# Patient Record
Sex: Female | Born: 1935 | Race: White | Hispanic: No | Smoking: Former smoker
Health system: Southern US, Community
[De-identification: ages and names within clinical notes are randomized; demographics above are authoritative.]

## PROBLEM LIST (undated history)

## (undated) DIAGNOSIS — E785 Hyperlipidemia, unspecified: Secondary | ICD-10-CM

## (undated) DIAGNOSIS — C569 Malignant neoplasm of unspecified ovary: Secondary | ICD-10-CM

## (undated) DIAGNOSIS — E119 Type 2 diabetes mellitus without complications: Secondary | ICD-10-CM

## (undated) DIAGNOSIS — K449 Diaphragmatic hernia without obstruction or gangrene: Secondary | ICD-10-CM

## (undated) DIAGNOSIS — G40909 Epilepsy, unspecified, not intractable, without status epilepticus: Secondary | ICD-10-CM

## (undated) DIAGNOSIS — R569 Unspecified convulsions: Secondary | ICD-10-CM

## (undated) DIAGNOSIS — T7840XA Allergy, unspecified, initial encounter: Secondary | ICD-10-CM

## (undated) DIAGNOSIS — D649 Anemia, unspecified: Secondary | ICD-10-CM

## (undated) DIAGNOSIS — C801 Malignant (primary) neoplasm, unspecified: Secondary | ICD-10-CM

## (undated) DIAGNOSIS — I10 Essential (primary) hypertension: Secondary | ICD-10-CM

## (undated) DIAGNOSIS — R7309 Other abnormal glucose: Secondary | ICD-10-CM

## (undated) HISTORY — DX: Allergy, unspecified, initial encounter: T78.40XA

## (undated) HISTORY — DX: Unspecified convulsions: R56.9

## (undated) HISTORY — PX: ABDOMINAL SURGERY: SHX537

## (undated) HISTORY — DX: Type 2 diabetes mellitus without complications: E11.9

## (undated) HISTORY — PX: CATARACT EXTRACTION, BILATERAL: SHX1313

## (undated) HISTORY — PX: ABDOMINAL HYSTERECTOMY: SHX81

## (undated) HISTORY — DX: Anemia, unspecified: D64.9

## (undated) HISTORY — PX: OTHER SURGICAL HISTORY: SHX169

## (undated) HISTORY — DX: Malignant neoplasm of unspecified ovary: C56.9

## (undated) HISTORY — PX: APPENDECTOMY: SHX54

---

## 2006-08-01 ENCOUNTER — Ambulatory Visit: Payer: Self-pay | Admitting: Internal Medicine

## 2006-09-20 ENCOUNTER — Ambulatory Visit: Payer: Self-pay | Admitting: Internal Medicine

## 2008-11-26 DIAGNOSIS — C569 Malignant neoplasm of unspecified ovary: Secondary | ICD-10-CM | POA: Insufficient documentation

## 2009-03-18 ENCOUNTER — Ambulatory Visit: Payer: Self-pay | Admitting: Pulmonary Disease

## 2009-03-18 ENCOUNTER — Inpatient Hospital Stay (HOSPITAL_COMMUNITY): Admission: EM | Admit: 2009-03-18 | Discharge: 2009-03-31 | Payer: Self-pay | Admitting: Emergency Medicine

## 2009-03-24 ENCOUNTER — Encounter (INDEPENDENT_AMBULATORY_CARE_PROVIDER_SITE_OTHER): Payer: Self-pay | Admitting: Internal Medicine

## 2009-03-31 ENCOUNTER — Inpatient Hospital Stay (HOSPITAL_COMMUNITY)
Admission: RE | Admit: 2009-03-31 | Discharge: 2009-04-10 | Payer: Self-pay | Admitting: Physical Medicine & Rehabilitation

## 2009-03-31 ENCOUNTER — Ambulatory Visit: Payer: Self-pay | Admitting: Physical Medicine & Rehabilitation

## 2009-04-04 ENCOUNTER — Ambulatory Visit: Payer: Self-pay | Admitting: Physical Medicine & Rehabilitation

## 2009-06-01 ENCOUNTER — Inpatient Hospital Stay (HOSPITAL_COMMUNITY): Admission: EM | Admit: 2009-06-01 | Discharge: 2009-06-05 | Payer: Self-pay | Admitting: Emergency Medicine

## 2009-06-09 ENCOUNTER — Emergency Department (HOSPITAL_COMMUNITY): Admission: EM | Admit: 2009-06-09 | Discharge: 2009-06-09 | Payer: Self-pay | Admitting: Emergency Medicine

## 2010-04-15 ENCOUNTER — Other Ambulatory Visit: Payer: Self-pay | Admitting: Oncology

## 2010-04-15 ENCOUNTER — Encounter (HOSPITAL_BASED_OUTPATIENT_CLINIC_OR_DEPARTMENT_OTHER): Payer: Medicare Other | Admitting: Oncology

## 2010-04-15 DIAGNOSIS — C569 Malignant neoplasm of unspecified ovary: Secondary | ICD-10-CM

## 2010-04-15 LAB — CBC WITH DIFFERENTIAL/PLATELET
EOS%: 2.6 % (ref 0.0–7.0)
HCT: 35.4 % (ref 34.8–46.6)
MONO#: 0.6 10*3/uL (ref 0.1–0.9)
NEUT%: 66.1 % (ref 38.4–76.8)
RBC: 3.65 10*6/uL — ABNORMAL LOW (ref 3.70–5.45)
RDW: 13.6 % (ref 11.2–14.5)

## 2010-04-15 LAB — COMPREHENSIVE METABOLIC PANEL
ALT: 10 U/L (ref 0–35)
Albumin: 3.8 g/dL (ref 3.5–5.2)
CO2: 28 mEq/L (ref 19–32)
Calcium: 9.2 mg/dL (ref 8.4–10.5)
Chloride: 102 mEq/L (ref 96–112)
Glucose, Bld: 176 mg/dL — ABNORMAL HIGH (ref 70–99)
Potassium: 4.4 mEq/L (ref 3.5–5.3)
Sodium: 139 mEq/L (ref 135–145)
Total Bilirubin: 0.4 mg/dL (ref 0.3–1.2)
Total Protein: 5.8 g/dL — ABNORMAL LOW (ref 6.0–8.3)

## 2010-04-15 LAB — CANCER ANTIGEN 27.29: CA 27.29: 321 U/mL — ABNORMAL HIGH (ref 0–39)

## 2010-04-16 ENCOUNTER — Other Ambulatory Visit: Payer: Self-pay | Admitting: Oncology

## 2010-04-16 DIAGNOSIS — C569 Malignant neoplasm of unspecified ovary: Secondary | ICD-10-CM

## 2010-04-23 ENCOUNTER — Ambulatory Visit (HOSPITAL_COMMUNITY)
Admission: RE | Admit: 2010-04-23 | Discharge: 2010-04-23 | Disposition: A | Discharge status: Home / Self Care | Payer: Medicare Other | Source: Ambulatory Visit | Attending: Oncology | Admitting: Oncology

## 2010-04-23 DIAGNOSIS — C569 Malignant neoplasm of unspecified ovary: Secondary | ICD-10-CM | POA: Insufficient documentation

## 2010-04-27 ENCOUNTER — Other Ambulatory Visit: Payer: Self-pay | Admitting: Oncology

## 2010-04-27 ENCOUNTER — Encounter (HOSPITAL_BASED_OUTPATIENT_CLINIC_OR_DEPARTMENT_OTHER): Payer: Medicare Other | Admitting: Oncology

## 2010-04-27 DIAGNOSIS — Z5111 Encounter for antineoplastic chemotherapy: Secondary | ICD-10-CM

## 2010-04-27 DIAGNOSIS — C569 Malignant neoplasm of unspecified ovary: Secondary | ICD-10-CM

## 2010-04-27 LAB — COMPREHENSIVE METABOLIC PANEL
AST: 35 U/L (ref 0–37)
Alkaline Phosphatase: 113 U/L (ref 39–117)
BUN: 24 mg/dL — ABNORMAL HIGH (ref 6–23)
Creatinine, Ser: 1.03 mg/dL (ref 0.40–1.20)

## 2010-04-27 LAB — CBC WITH DIFFERENTIAL/PLATELET
BASO%: 0.2 % (ref 0.0–2.0)
EOS%: 2.5 % (ref 0.0–7.0)
MCH: 31.4 pg (ref 25.1–34.0)
MCHC: 33.5 g/dL (ref 31.5–36.0)
MONO#: 0.8 10*3/uL (ref 0.1–0.9)
RBC: 3.76 10*6/uL (ref 3.70–5.45)
RDW: 12.6 % (ref 11.2–14.5)
WBC: 6.3 10*3/uL (ref 3.9–10.3)
lymph#: 0.9 10*3/uL (ref 0.9–3.3)

## 2010-05-04 ENCOUNTER — Other Ambulatory Visit: Payer: Self-pay | Admitting: Oncology

## 2010-05-04 ENCOUNTER — Encounter (HOSPITAL_BASED_OUTPATIENT_CLINIC_OR_DEPARTMENT_OTHER): Payer: Medicare Other | Admitting: Oncology

## 2010-05-04 DIAGNOSIS — C569 Malignant neoplasm of unspecified ovary: Secondary | ICD-10-CM

## 2010-05-04 DIAGNOSIS — Z5111 Encounter for antineoplastic chemotherapy: Secondary | ICD-10-CM

## 2010-05-04 LAB — CBC WITH DIFFERENTIAL/PLATELET
Basophils Absolute: 0 10*3/uL (ref 0.0–0.1)
Eosinophils Absolute: 0 10*3/uL (ref 0.0–0.5)
HGB: 11.9 g/dL (ref 11.6–15.9)
LYMPH%: 20.4 % (ref 14.0–49.7)
MCV: 91.6 fL (ref 79.5–101.0)
MONO#: 0.4 10*3/uL (ref 0.1–0.9)
MONO%: 9.9 % (ref 0.0–14.0)
NEUT#: 3 10*3/uL (ref 1.5–6.5)
Platelets: 140 10*3/uL — ABNORMAL LOW (ref 145–400)
RDW: 12.2 % (ref 11.2–14.5)
WBC: 4.4 10*3/uL (ref 3.9–10.3)

## 2010-05-18 ENCOUNTER — Other Ambulatory Visit: Payer: Self-pay | Admitting: Oncology

## 2010-05-18 ENCOUNTER — Encounter (HOSPITAL_BASED_OUTPATIENT_CLINIC_OR_DEPARTMENT_OTHER): Payer: Medicare Other | Admitting: Oncology

## 2010-05-18 DIAGNOSIS — C569 Malignant neoplasm of unspecified ovary: Secondary | ICD-10-CM

## 2010-05-18 DIAGNOSIS — Z5111 Encounter for antineoplastic chemotherapy: Secondary | ICD-10-CM

## 2010-05-18 LAB — CBC WITH DIFFERENTIAL/PLATELET
BASO%: 0.5 % (ref 0.0–2.0)
Basophils Absolute: 0 10*3/uL (ref 0.0–0.1)
HCT: 30.5 % — ABNORMAL LOW (ref 34.8–46.6)
HGB: 10.1 g/dL — ABNORMAL LOW (ref 11.6–15.9)
LYMPH%: 25.8 % (ref 14.0–49.7)
MCHC: 33.1 g/dL (ref 31.5–36.0)
MONO#: 0.8 10*3/uL (ref 0.1–0.9)
NEUT%: 52 % (ref 38.4–76.8)
Platelets: 346 10*3/uL (ref 145–400)
WBC: 3.9 10*3/uL (ref 3.9–10.3)
lymph#: 1 10*3/uL (ref 0.9–3.3)

## 2010-05-18 LAB — COMPREHENSIVE METABOLIC PANEL
Albumin: 3.8 g/dL (ref 3.5–5.2)
BUN: 21 mg/dL (ref 6–23)
CO2: 24 mEq/L (ref 19–32)
Calcium: 9.1 mg/dL (ref 8.4–10.5)
Chloride: 101 mEq/L (ref 96–112)
Creatinine, Ser: 1.13 mg/dL (ref 0.40–1.20)
Glucose, Bld: 98 mg/dL (ref 70–99)
Potassium: 4.5 mEq/L (ref 3.5–5.3)

## 2010-05-18 LAB — CA 125: CA 125: 2290.9 U/mL — ABNORMAL HIGH (ref 0.0–30.2)

## 2010-05-23 LAB — BASIC METABOLIC PANEL
BUN: 12 mg/dL (ref 6–23)
BUN: 7 mg/dL (ref 6–23)
BUN: 8 mg/dL (ref 6–23)
BUN: 9 mg/dL (ref 6–23)
BUN: 9 mg/dL (ref 6–23)
BUN: 9 mg/dL (ref 6–23)
CO2: 17 mEq/L — ABNORMAL LOW (ref 19–32)
CO2: 19 mEq/L (ref 19–32)
CO2: 20 mEq/L (ref 19–32)
CO2: 20 mEq/L (ref 19–32)
CO2: 21 mEq/L (ref 19–32)
CO2: 21 mEq/L (ref 19–32)
CO2: 21 mEq/L (ref 19–32)
CO2: 24 mEq/L (ref 19–32)
Calcium: 8 mg/dL — ABNORMAL LOW (ref 8.4–10.5)
Calcium: 8.1 mg/dL — ABNORMAL LOW (ref 8.4–10.5)
Calcium: 8.1 mg/dL — ABNORMAL LOW (ref 8.4–10.5)
Calcium: 8.3 mg/dL — ABNORMAL LOW (ref 8.4–10.5)
Calcium: 8.4 mg/dL (ref 8.4–10.5)
Calcium: 8.7 mg/dL (ref 8.4–10.5)
Chloride: 103 mEq/L (ref 96–112)
Chloride: 107 mEq/L (ref 96–112)
Chloride: 98 mEq/L (ref 96–112)
Creatinine, Ser: 0.81 mg/dL (ref 0.4–1.2)
Creatinine, Ser: 0.82 mg/dL (ref 0.4–1.2)
Creatinine, Ser: 0.87 mg/dL (ref 0.4–1.2)
Creatinine, Ser: 0.88 mg/dL (ref 0.4–1.2)
GFR calc Af Amer: >60 mL/min (ref >60)
GFR calc Af Amer: >60 mL/min (ref >60)
GFR calc Af Amer: >60 mL/min (ref >60)
GFR calc Af Amer: >60 mL/min (ref >60)
GFR calc Af Amer: >60 mL/min (ref >60)
GFR calc non Af Amer: >60 mL/min (ref >60)
GFR calc non Af Amer: >60 mL/min (ref >60)
GFR calc non Af Amer: >60 mL/min (ref >60)
Glucose, Bld: 120 mg/dL — ABNORMAL HIGH (ref 70–99)
Glucose, Bld: 131 mg/dL — ABNORMAL HIGH (ref 70–99)
Glucose, Bld: 137 mg/dL — ABNORMAL HIGH (ref 70–99)
Glucose, Bld: 169 mg/dL — ABNORMAL HIGH (ref 70–99)
Glucose, Bld: 188 mg/dL — ABNORMAL HIGH (ref 70–99)
Glucose, Bld: 63 mg/dL — ABNORMAL LOW (ref 70–99)
Glucose, Bld: 82 mg/dL (ref 70–99)
Potassium: 3.3 mEq/L — ABNORMAL LOW (ref 3.5–5.1)
Potassium: 3.4 mEq/L — ABNORMAL LOW (ref 3.5–5.1)
Potassium: 3.6 mEq/L (ref 3.5–5.1)
Potassium: 3.6 mEq/L (ref 3.5–5.1)
Potassium: 3.7 mEq/L (ref 3.5–5.1)
Potassium: 4.2 mEq/L (ref 3.5–5.1)
Potassium: 4.2 mEq/L (ref 3.5–5.1)
Potassium: 4.3 mEq/L (ref 3.5–5.1)
Sodium: 119 mEq/L — CL (ref 135–145)
Sodium: 119 mEq/L — CL (ref 135–145)
Sodium: 122 mEq/L — ABNORMAL LOW (ref 135–145)
Sodium: 128 mEq/L — ABNORMAL LOW (ref 135–145)
Sodium: 130 mEq/L — ABNORMAL LOW (ref 135–145)
Sodium: 131 mEq/L — ABNORMAL LOW (ref 135–145)
Sodium: 133 mEq/L — ABNORMAL LOW (ref 135–145)

## 2010-05-23 LAB — CK TOTAL AND CKMB (NOT AT ARMC)
CK, MB: 4 ng/mL (ref 0.3–4.0)
Relative Index: INVALID (ref 0.0–2.5)
Total CK: 70 U/L (ref 7–177)

## 2010-05-23 LAB — CBC
HCT: 18.6 % — ABNORMAL LOW (ref 36.0–46.0)
HCT: 23.3 % — ABNORMAL LOW (ref 36.0–46.0)
HCT: 27 % — ABNORMAL LOW (ref 36.0–46.0)
Hemoglobin: 6.6 g/dL — CL (ref 12.0–15.0)
Hemoglobin: 8.3 g/dL — ABNORMAL LOW (ref 12.0–15.0)
Hemoglobin: 9.4 g/dL — ABNORMAL LOW (ref 12.0–15.0)
Hemoglobin: 9.7 g/dL — ABNORMAL LOW (ref 12.0–15.0)
MCHC: 35.3 g/dL (ref 30.0–36.0)
MCV: 87.2 fL (ref 78.0–100.0)
MCV: 87.3 fL (ref 78.0–100.0)
RBC: 2.13 MIL/uL — ABNORMAL LOW (ref 3.87–5.11)
RBC: 2.69 MIL/uL — ABNORMAL LOW (ref 3.87–5.11)
RBC: 3.1 MIL/uL — ABNORMAL LOW (ref 3.87–5.11)
RBC: 3.2 MIL/uL — ABNORMAL LOW (ref 3.87–5.11)
RDW: 22.2 % — ABNORMAL HIGH (ref 11.5–15.5)
WBC: 2.4 10*3/uL — ABNORMAL LOW (ref 4.0–10.5)
WBC: 3.8 10*3/uL — ABNORMAL LOW (ref 4.0–10.5)
WBC: 9.9 10*3/uL (ref 4.0–10.5)

## 2010-05-23 LAB — DIFFERENTIAL
Basophils Relative: 0 % (ref 0–1)
Eosinophils Relative: 0 % (ref 0–5)
Lymphocytes Relative: 38 % (ref 12–46)
Monocytes Absolute: 1.8 10*3/uL — ABNORMAL HIGH (ref 0.1–1.0)
Neutro Abs: 4.3 10*3/uL (ref 1.7–7.7)
Neutrophils Relative %: 44 % (ref 43–77)

## 2010-05-23 LAB — CROSSMATCH

## 2010-05-23 LAB — HEMOGLOBIN A1C: Hgb A1c MFr Bld: 7.3 % — ABNORMAL HIGH (ref 4.6–6.1)

## 2010-05-23 LAB — GLUCOSE, CAPILLARY
Glucose-Capillary: 102 mg/dL — ABNORMAL HIGH (ref 70–99)
Glucose-Capillary: 104 mg/dL — ABNORMAL HIGH (ref 70–99)
Glucose-Capillary: 116 mg/dL — ABNORMAL HIGH (ref 70–99)
Glucose-Capillary: 122 mg/dL — ABNORMAL HIGH (ref 70–99)
Glucose-Capillary: 143 mg/dL — ABNORMAL HIGH (ref 70–99)
Glucose-Capillary: 43 mg/dL — CL (ref 70–99)
Glucose-Capillary: 77 mg/dL (ref 70–99)
Glucose-Capillary: 90 mg/dL (ref 70–99)
Glucose-Capillary: 91 mg/dL (ref 70–99)

## 2010-05-23 LAB — SODIUM, URINE, RANDOM
Sodium, Ur: 69 mEq/L
Sodium, Ur: 80 mEq/L

## 2010-05-23 LAB — HEMOGLOBIN AND HEMATOCRIT, BLOOD: HCT: 18.3 % — ABNORMAL LOW (ref 36.0–46.0)

## 2010-05-23 LAB — URINE MICROSCOPIC-ADD ON

## 2010-05-23 LAB — POCT I-STAT 3, ART BLOOD GAS (G3+)
Patient temperature: 36.1
pH, Arterial: 7.429 — ABNORMAL HIGH (ref 7.350–7.400)

## 2010-05-23 LAB — URINALYSIS, ROUTINE W REFLEX MICROSCOPIC
Glucose, UA: >1000 mg/dL — AB
Specific Gravity, Urine: 1.018 (ref 1.005–1.030)
Urobilinogen, UA: 0.2 mg/dL (ref 0.0–1.0)
pH: 5 (ref 5.0–8.0)

## 2010-05-23 LAB — PHOSPHORUS
Phosphorus: 2.6 mg/dL (ref 2.3–4.6)
Phosphorus: 4 mg/dL (ref 2.3–4.6)
Phosphorus: 4.3 mg/dL (ref 2.3–4.6)

## 2010-05-23 LAB — COMPREHENSIVE METABOLIC PANEL
ALT: 21 U/L (ref 0–35)
Albumin: 3.4 g/dL — ABNORMAL LOW (ref 3.5–5.2)
Calcium: 8.7 mg/dL (ref 8.4–10.5)
Glucose, Bld: 299 mg/dL — ABNORMAL HIGH (ref 70–99)
Sodium: 117 mEq/L — CL (ref 135–145)
Total Protein: 5.9 g/dL — ABNORMAL LOW (ref 6.0–8.3)

## 2010-05-23 LAB — PREPARE RBC (CROSSMATCH)

## 2010-05-23 LAB — MAGNESIUM
Magnesium: 1.1 mg/dL — ABNORMAL LOW (ref 1.5–2.5)
Magnesium: 1.7 mg/dL (ref 1.5–2.5)

## 2010-05-23 LAB — TROPONIN I: Troponin I: 0.05 ng/mL (ref 0.00–0.06)

## 2010-05-23 LAB — PROTIME-INR
INR: 0.91 (ref 0.00–1.49)
INR: 0.94 (ref 0.00–1.49)
Prothrombin Time: 12.2 seconds (ref 11.6–15.2)
Prothrombin Time: 12.5 seconds (ref 11.6–15.2)

## 2010-05-23 LAB — APTT: aPTT: 22 seconds — ABNORMAL LOW (ref 24–37)

## 2010-05-23 LAB — OSMOLALITY: Osmolality: 244 mOsm/kg — CL (ref 275–300)

## 2010-05-23 LAB — ABO/RH: ABO/RH(D): A POS

## 2010-05-23 LAB — URINE CULTURE

## 2010-05-23 LAB — OSMOLALITY, URINE: Osmolality, Ur: 366 mOsm/kg — ABNORMAL LOW (ref 390–1090)

## 2010-05-24 LAB — BASIC METABOLIC PANEL
BUN: 11 mg/dL (ref 6–23)
BUN: 11 mg/dL (ref 6–23)
BUN: 26 mg/dL — ABNORMAL HIGH (ref 6–23)
BUN: 29 mg/dL — ABNORMAL HIGH (ref 6–23)
BUN: 30 mg/dL — ABNORMAL HIGH (ref 6–23)
CO2: 21 mEq/L (ref 19–32)
CO2: 22 mEq/L (ref 19–32)
CO2: 24 mEq/L (ref 19–32)
CO2: 28 mEq/L (ref 19–32)
CO2: 29 mEq/L (ref 19–32)
CO2: 30 mEq/L (ref 19–32)
CO2: 31 mEq/L (ref 19–32)
Calcium: 8.3 mg/dL — ABNORMAL LOW (ref 8.4–10.5)
Calcium: 8.5 mg/dL (ref 8.4–10.5)
Calcium: 8.7 mg/dL (ref 8.4–10.5)
Calcium: 9 mg/dL (ref 8.4–10.5)
Calcium: 9.1 mg/dL (ref 8.4–10.5)
Calcium: 9.3 mg/dL (ref 8.4–10.5)
Chloride: 102 mEq/L (ref 96–112)
Chloride: 102 mEq/L (ref 96–112)
Chloride: 103 mEq/L (ref 96–112)
Chloride: 104 mEq/L (ref 96–112)
Chloride: 106 mEq/L (ref 96–112)
Chloride: 108 mEq/L (ref 96–112)
Creatinine, Ser: 0.83 mg/dL (ref 0.4–1.2)
Creatinine, Ser: 1.2 mg/dL (ref 0.4–1.2)
Creatinine, Ser: 1.4 mg/dL — ABNORMAL HIGH (ref 0.4–1.2)
GFR calc Af Amer: 42 mL/min — ABNORMAL LOW (ref >60)
GFR calc Af Amer: 44 mL/min — ABNORMAL LOW (ref >60)
GFR calc Af Amer: 47 mL/min — ABNORMAL LOW (ref >60)
GFR calc Af Amer: 53 mL/min — ABNORMAL LOW (ref >60)
GFR calc Af Amer: >60 mL/min (ref >60)
GFR calc Af Amer: >60 mL/min (ref >60)
GFR calc Af Amer: >60 mL/min (ref >60)
GFR calc Af Amer: >60 mL/min (ref >60)
GFR calc non Af Amer: 35 mL/min — ABNORMAL LOW (ref >60)
GFR calc non Af Amer: 39 mL/min — ABNORMAL LOW (ref >60)
GFR calc non Af Amer: 46 mL/min — ABNORMAL LOW (ref >60)
GFR calc non Af Amer: 58 mL/min — ABNORMAL LOW (ref >60)
GFR calc non Af Amer: 59 mL/min — ABNORMAL LOW (ref >60)
GFR calc non Af Amer: >60 mL/min (ref >60)
Glucose, Bld: 125 mg/dL — ABNORMAL HIGH (ref 70–99)
Glucose, Bld: 170 mg/dL — ABNORMAL HIGH (ref 70–99)
Glucose, Bld: 217 mg/dL — ABNORMAL HIGH (ref 70–99)
Glucose, Bld: 252 mg/dL — ABNORMAL HIGH (ref 70–99)
Glucose, Bld: 290 mg/dL — ABNORMAL HIGH (ref 70–99)
Glucose, Bld: 95 mg/dL (ref 70–99)
Potassium: 3.3 mEq/L — ABNORMAL LOW (ref 3.5–5.1)
Potassium: 3.6 mEq/L (ref 3.5–5.1)
Potassium: 3.7 mEq/L (ref 3.5–5.1)
Potassium: 4.1 mEq/L (ref 3.5–5.1)
Potassium: 4.1 mEq/L (ref 3.5–5.1)
Potassium: 4.4 mEq/L (ref 3.5–5.1)
Potassium: 4.9 mEq/L (ref 3.5–5.1)
Sodium: 133 mEq/L — ABNORMAL LOW (ref 135–145)
Sodium: 135 mEq/L (ref 135–145)
Sodium: 135 mEq/L (ref 135–145)
Sodium: 136 mEq/L (ref 135–145)
Sodium: 137 mEq/L (ref 135–145)
Sodium: 142 mEq/L (ref 135–145)
Sodium: 142 mEq/L (ref 135–145)

## 2010-05-24 LAB — CBC
HCT: 28.3 % — ABNORMAL LOW (ref 36.0–46.0)
HCT: 28.4 % — ABNORMAL LOW (ref 36.0–46.0)
HCT: 29.8 % — ABNORMAL LOW (ref 36.0–46.0)
HCT: 30 % — ABNORMAL LOW (ref 36.0–46.0)
HCT: 30.6 % — ABNORMAL LOW (ref 36.0–46.0)
HCT: 31.5 % — ABNORMAL LOW (ref 36.0–46.0)
Hemoglobin: 10.3 g/dL — ABNORMAL LOW (ref 12.0–15.0)
Hemoglobin: 10.6 g/dL — ABNORMAL LOW (ref 12.0–15.0)
Hemoglobin: 10.6 g/dL — ABNORMAL LOW (ref 12.0–15.0)
Hemoglobin: 8.9 g/dL — ABNORMAL LOW (ref 12.0–15.0)
Hemoglobin: 9.1 g/dL — ABNORMAL LOW (ref 12.0–15.0)
Hemoglobin: 9.6 g/dL — ABNORMAL LOW (ref 12.0–15.0)
Hemoglobin: 9.8 g/dL — ABNORMAL LOW (ref 12.0–15.0)
MCHC: 33.6 g/dL (ref 30.0–36.0)
MCHC: 33.7 g/dL (ref 30.0–36.0)
MCHC: 33.8 g/dL (ref 30.0–36.0)
MCHC: 34.4 g/dL (ref 30.0–36.0)
MCHC: 34.5 g/dL (ref 30.0–36.0)
MCV: 88.2 fL (ref 78.0–100.0)
MCV: 88.5 fL (ref 78.0–100.0)
MCV: 90.6 fL (ref 78.0–100.0)
Platelets: 122 10*3/uL — ABNORMAL LOW (ref 150–400)
Platelets: 122 10*3/uL — ABNORMAL LOW (ref 150–400)
RBC: 2.96 MIL/uL — ABNORMAL LOW (ref 3.87–5.11)
RBC: 3.15 MIL/uL — ABNORMAL LOW (ref 3.87–5.11)
RBC: 3.39 MIL/uL — ABNORMAL LOW (ref 3.87–5.11)
RBC: 3.42 MIL/uL — ABNORMAL LOW (ref 3.87–5.11)
RBC: 3.48 MIL/uL — ABNORMAL LOW (ref 3.87–5.11)
RDW: 21 % — ABNORMAL HIGH (ref 11.5–15.5)
RDW: 21.3 % — ABNORMAL HIGH (ref 11.5–15.5)
RDW: 22 % — ABNORMAL HIGH (ref 11.5–15.5)
RDW: 22.2 % — ABNORMAL HIGH (ref 11.5–15.5)
WBC: 4.3 10*3/uL (ref 4.0–10.5)
WBC: 4.6 10*3/uL (ref 4.0–10.5)
WBC: 4.6 10*3/uL (ref 4.0–10.5)
WBC: 4.8 10*3/uL (ref 4.0–10.5)

## 2010-05-24 LAB — URINALYSIS, ROUTINE W REFLEX MICROSCOPIC
Glucose, UA: NEGATIVE mg/dL
Nitrite: NEGATIVE
Nitrite: NEGATIVE
Protein, ur: NEGATIVE mg/dL
Protein, ur: NEGATIVE mg/dL
Specific Gravity, Urine: 1.014 (ref 1.005–1.030)
Urobilinogen, UA: 1 mg/dL (ref 0.0–1.0)
pH: 6.5 (ref 5.0–8.0)

## 2010-05-24 LAB — COMPREHENSIVE METABOLIC PANEL
ALT: 29 U/L (ref 0–35)
Alkaline Phosphatase: 86 U/L (ref 39–117)
CO2: 28 mEq/L (ref 19–32)
Calcium: 9.1 mg/dL (ref 8.4–10.5)
Chloride: 107 mEq/L (ref 96–112)
GFR calc non Af Amer: >60 mL/min (ref >60)
Glucose, Bld: 75 mg/dL (ref 70–99)
Sodium: 143 mEq/L (ref 135–145)
Total Bilirubin: 0.4 mg/dL (ref 0.3–1.2)

## 2010-05-24 LAB — GLUCOSE, CAPILLARY
Glucose-Capillary: 100 mg/dL — ABNORMAL HIGH (ref 70–99)
Glucose-Capillary: 104 mg/dL — ABNORMAL HIGH (ref 70–99)
Glucose-Capillary: 104 mg/dL — ABNORMAL HIGH (ref 70–99)
Glucose-Capillary: 116 mg/dL — ABNORMAL HIGH (ref 70–99)
Glucose-Capillary: 116 mg/dL — ABNORMAL HIGH (ref 70–99)
Glucose-Capillary: 117 mg/dL — ABNORMAL HIGH (ref 70–99)
Glucose-Capillary: 122 mg/dL — ABNORMAL HIGH (ref 70–99)
Glucose-Capillary: 124 mg/dL — ABNORMAL HIGH (ref 70–99)
Glucose-Capillary: 125 mg/dL — ABNORMAL HIGH (ref 70–99)
Glucose-Capillary: 127 mg/dL — ABNORMAL HIGH (ref 70–99)
Glucose-Capillary: 127 mg/dL — ABNORMAL HIGH (ref 70–99)
Glucose-Capillary: 128 mg/dL — ABNORMAL HIGH (ref 70–99)
Glucose-Capillary: 130 mg/dL — ABNORMAL HIGH (ref 70–99)
Glucose-Capillary: 132 mg/dL — ABNORMAL HIGH (ref 70–99)
Glucose-Capillary: 142 mg/dL — ABNORMAL HIGH (ref 70–99)
Glucose-Capillary: 147 mg/dL — ABNORMAL HIGH (ref 70–99)
Glucose-Capillary: 148 mg/dL — ABNORMAL HIGH (ref 70–99)
Glucose-Capillary: 150 mg/dL — ABNORMAL HIGH (ref 70–99)
Glucose-Capillary: 151 mg/dL — ABNORMAL HIGH (ref 70–99)
Glucose-Capillary: 153 mg/dL — ABNORMAL HIGH (ref 70–99)
Glucose-Capillary: 155 mg/dL — ABNORMAL HIGH (ref 70–99)
Glucose-Capillary: 156 mg/dL — ABNORMAL HIGH (ref 70–99)
Glucose-Capillary: 156 mg/dL — ABNORMAL HIGH (ref 70–99)
Glucose-Capillary: 157 mg/dL — ABNORMAL HIGH (ref 70–99)
Glucose-Capillary: 159 mg/dL — ABNORMAL HIGH (ref 70–99)
Glucose-Capillary: 160 mg/dL — ABNORMAL HIGH (ref 70–99)
Glucose-Capillary: 167 mg/dL — ABNORMAL HIGH (ref 70–99)
Glucose-Capillary: 171 mg/dL — ABNORMAL HIGH (ref 70–99)
Glucose-Capillary: 172 mg/dL — ABNORMAL HIGH (ref 70–99)
Glucose-Capillary: 180 mg/dL — ABNORMAL HIGH (ref 70–99)
Glucose-Capillary: 181 mg/dL — ABNORMAL HIGH (ref 70–99)
Glucose-Capillary: 191 mg/dL — ABNORMAL HIGH (ref 70–99)
Glucose-Capillary: 205 mg/dL — ABNORMAL HIGH (ref 70–99)
Glucose-Capillary: 224 mg/dL — ABNORMAL HIGH (ref 70–99)
Glucose-Capillary: 233 mg/dL — ABNORMAL HIGH (ref 70–99)
Glucose-Capillary: 254 mg/dL — ABNORMAL HIGH (ref 70–99)
Glucose-Capillary: 283 mg/dL — ABNORMAL HIGH (ref 70–99)
Glucose-Capillary: 65 mg/dL — ABNORMAL LOW (ref 70–99)
Glucose-Capillary: 84 mg/dL (ref 70–99)
Glucose-Capillary: 87 mg/dL (ref 70–99)
Glucose-Capillary: 90 mg/dL (ref 70–99)
Glucose-Capillary: 92 mg/dL (ref 70–99)
Glucose-Capillary: 95 mg/dL (ref 70–99)
Glucose-Capillary: 98 mg/dL (ref 70–99)
Glucose-Capillary: 99 mg/dL (ref 70–99)

## 2010-05-24 LAB — DIFFERENTIAL
Basophils Absolute: 0 10*3/uL (ref 0.0–0.1)
Basophils Absolute: 0 10*3/uL (ref 0.0–0.1)
Basophils Relative: 0 % (ref 0–1)
Eosinophils Absolute: 0.1 10*3/uL (ref 0.0–0.7)
Eosinophils Absolute: 0.1 10*3/uL (ref 0.0–0.7)
Eosinophils Relative: 3 % (ref 0–5)
Lymphocytes Relative: 26 % (ref 12–46)
Lymphs Abs: 0.9 10*3/uL (ref 0.7–4.0)
Monocytes Relative: 17 % — ABNORMAL HIGH (ref 3–12)
Neutrophils Relative %: 54 % (ref 43–77)
Neutrophils Relative %: 64 % (ref 43–77)

## 2010-05-24 LAB — URINALYSIS, MICROSCOPIC ONLY
Glucose, UA: 100 mg/dL — AB
Urobilinogen, UA: 1 mg/dL (ref 0.0–1.0)

## 2010-05-24 LAB — PHOSPHORUS
Phosphorus: 2.8 mg/dL (ref 2.3–4.6)
Phosphorus: 3.5 mg/dL (ref 2.3–4.6)

## 2010-05-24 LAB — URINE MICROSCOPIC-ADD ON

## 2010-05-24 LAB — HEMOCCULT GUIAC POC 1CARD (OFFICE): Fecal Occult Bld: POSITIVE

## 2010-05-24 LAB — URINE CULTURE
Colony Count: NO GROWTH
Culture: NO GROWTH
Culture: NO GROWTH

## 2010-05-24 LAB — CARDIAC PANEL(CRET KIN+CKTOT+MB+TROPI)
CK, MB: 7.6 ng/mL (ref 0.3–4.0)
Total CK: 307 U/L — ABNORMAL HIGH (ref 7–177)

## 2010-05-24 LAB — MAGNESIUM
Magnesium: 1.6 mg/dL (ref 1.5–2.5)
Magnesium: 2.5 mg/dL (ref 1.5–2.5)

## 2010-05-25 ENCOUNTER — Encounter (HOSPITAL_BASED_OUTPATIENT_CLINIC_OR_DEPARTMENT_OTHER): Payer: Medicare Other | Admitting: Oncology

## 2010-05-25 ENCOUNTER — Other Ambulatory Visit: Payer: Self-pay | Admitting: Oncology

## 2010-05-25 DIAGNOSIS — C569 Malignant neoplasm of unspecified ovary: Secondary | ICD-10-CM

## 2010-05-25 DIAGNOSIS — Z5111 Encounter for antineoplastic chemotherapy: Secondary | ICD-10-CM

## 2010-05-25 DIAGNOSIS — Z452 Encounter for adjustment and management of vascular access device: Secondary | ICD-10-CM

## 2010-05-25 LAB — CBC WITH DIFFERENTIAL/PLATELET
Basophils Absolute: 0 10*3/uL (ref 0.0–0.1)
Eosinophils Absolute: 0 10*3/uL (ref 0.0–0.5)
HCT: 26.6 % — ABNORMAL LOW (ref 34.8–46.6)
HGB: 9.2 g/dL — ABNORMAL LOW (ref 11.6–15.9)
LYMPH%: 36.6 % (ref 14.0–49.7)
MCV: 93 fL (ref 79.5–101.0)
MONO#: 0.2 10*3/uL (ref 0.1–0.9)
NEUT#: 1.7 10*3/uL (ref 1.5–6.5)
NEUT%: 56.9 % (ref 38.4–76.8)
Platelets: 296 10*3/uL (ref 145–400)
RBC: 2.86 10*6/uL — ABNORMAL LOW (ref 3.70–5.45)
WBC: 3.1 10*3/uL — ABNORMAL LOW (ref 3.9–10.3)

## 2010-05-27 LAB — CBC
Hemoglobin: 8.6 g/dL — ABNORMAL LOW (ref 12.0–15.0)
MCHC: 34.5 g/dL (ref 30.0–36.0)
RBC: 2.67 MIL/uL — ABNORMAL LOW (ref 3.87–5.11)
RBC: 2.74 MIL/uL — ABNORMAL LOW (ref 3.87–5.11)
RDW: 21.5 % — ABNORMAL HIGH (ref 11.5–15.5)
WBC: 4.4 10*3/uL (ref 4.0–10.5)

## 2010-05-27 LAB — GLUCOSE, CAPILLARY
Glucose-Capillary: 101 mg/dL — ABNORMAL HIGH (ref 70–99)
Glucose-Capillary: 102 mg/dL — ABNORMAL HIGH (ref 70–99)
Glucose-Capillary: 110 mg/dL — ABNORMAL HIGH (ref 70–99)
Glucose-Capillary: 113 mg/dL — ABNORMAL HIGH (ref 70–99)
Glucose-Capillary: 95 mg/dL (ref 70–99)

## 2010-05-27 LAB — DIFFERENTIAL
Basophils Absolute: 0 10*3/uL (ref 0.0–0.1)
Eosinophils Absolute: 0.1 10*3/uL (ref 0.0–0.7)
Lymphocytes Relative: 26 % (ref 12–46)
Neutrophils Relative %: 54 % (ref 43–77)

## 2010-05-27 LAB — BASIC METABOLIC PANEL
CO2: 27 mEq/L (ref 19–32)
Calcium: 9.2 mg/dL (ref 8.4–10.5)
Creatinine, Ser: 0.94 mg/dL (ref 0.4–1.2)
GFR calc Af Amer: >60 mL/min (ref >60)
GFR calc non Af Amer: 58 mL/min — ABNORMAL LOW (ref >60)
Glucose, Bld: 104 mg/dL — ABNORMAL HIGH (ref 70–99)

## 2010-05-28 ENCOUNTER — Other Ambulatory Visit: Payer: Self-pay | Admitting: Oncology

## 2010-05-28 ENCOUNTER — Encounter (HOSPITAL_BASED_OUTPATIENT_CLINIC_OR_DEPARTMENT_OTHER): Payer: Medicare Other | Admitting: Oncology

## 2010-05-28 DIAGNOSIS — Z5111 Encounter for antineoplastic chemotherapy: Secondary | ICD-10-CM

## 2010-05-28 DIAGNOSIS — C569 Malignant neoplasm of unspecified ovary: Secondary | ICD-10-CM

## 2010-05-28 LAB — CBC WITH DIFFERENTIAL/PLATELET
Basophils Absolute: 0 10*3/uL (ref 0.0–0.1)
Eosinophils Absolute: 0 10*3/uL (ref 0.0–0.5)
HGB: 9.5 g/dL — ABNORMAL LOW (ref 11.6–15.9)
LYMPH%: 14.9 % (ref 14.0–49.7)
MCV: 92.6 fL (ref 79.5–101.0)
MONO%: 1 % (ref 0.0–14.0)
NEUT#: 3.6 10*3/uL (ref 1.5–6.5)
Platelets: 168 10*3/uL (ref 145–400)
RDW: 13.2 % (ref 11.2–14.5)

## 2010-05-31 LAB — TSH: TSH: 1.024 u[IU]/mL (ref 0.350–4.500)

## 2010-05-31 LAB — CBC
HCT: 25 % — ABNORMAL LOW (ref 36.0–46.0)
Hemoglobin: 8.6 g/dL — ABNORMAL LOW (ref 12.0–15.0)
MCHC: 34.1 g/dL (ref 30.0–36.0)
MCHC: 34.6 g/dL (ref 30.0–36.0)
MCV: 103.3 fL — ABNORMAL HIGH (ref 78.0–100.0)
Platelets: 178 10*3/uL (ref 150–400)
RDW: 21.2 % — ABNORMAL HIGH (ref 11.5–15.5)
RDW: 21.4 % — ABNORMAL HIGH (ref 11.5–15.5)
WBC: 7 10*3/uL (ref 4.0–10.5)

## 2010-05-31 LAB — CULTURE, BLOOD (ROUTINE X 2): Culture: NO GROWTH

## 2010-05-31 LAB — DIFFERENTIAL
Basophils Relative: 0 % (ref 0–1)
Eosinophils Relative: 0 % (ref 0–5)
Lymphs Abs: 0.4 10*3/uL — ABNORMAL LOW (ref 0.7–4.0)
Monocytes Absolute: 0.6 10*3/uL (ref 0.1–1.0)

## 2010-05-31 LAB — COMPREHENSIVE METABOLIC PANEL
AST: 28 U/L (ref 0–37)
Albumin: 3.8 g/dL (ref 3.5–5.2)
Alkaline Phosphatase: 66 U/L (ref 39–117)
BUN: 23 mg/dL (ref 6–23)
Calcium: 9.1 mg/dL (ref 8.4–10.5)
Calcium: 9.9 mg/dL (ref 8.4–10.5)
Creatinine, Ser: 1.36 mg/dL — ABNORMAL HIGH (ref 0.4–1.2)
GFR calc Af Amer: 46 mL/min — ABNORMAL LOW (ref >60)
GFR calc non Af Amer: 44 mL/min — ABNORMAL LOW (ref >60)
Glucose, Bld: 99 mg/dL (ref 70–99)
Potassium: 4.2 mEq/L (ref 3.5–5.1)
Total Protein: 6 g/dL (ref 6.0–8.3)
Total Protein: 6.7 g/dL (ref 6.0–8.3)

## 2010-05-31 LAB — URINALYSIS, ROUTINE W REFLEX MICROSCOPIC
Glucose, UA: NEGATIVE mg/dL
Ketones, ur: NEGATIVE mg/dL
pH: 5 (ref 5.0–8.0)

## 2010-05-31 LAB — DRUG SCREEN PANEL (SERUM)

## 2010-05-31 LAB — URINE CULTURE
Colony Count: NO GROWTH
Culture: NO GROWTH

## 2010-06-08 ENCOUNTER — Other Ambulatory Visit: Payer: Self-pay | Admitting: Oncology

## 2010-06-08 ENCOUNTER — Encounter (HOSPITAL_BASED_OUTPATIENT_CLINIC_OR_DEPARTMENT_OTHER): Payer: Medicare Other | Admitting: Oncology

## 2010-06-08 DIAGNOSIS — Z5111 Encounter for antineoplastic chemotherapy: Secondary | ICD-10-CM

## 2010-06-08 DIAGNOSIS — C569 Malignant neoplasm of unspecified ovary: Secondary | ICD-10-CM

## 2010-06-08 LAB — COMPREHENSIVE METABOLIC PANEL
ALT: 12 U/L (ref 0–35)
AST: 16 U/L (ref 0–37)
Alkaline Phosphatase: 140 U/L — ABNORMAL HIGH (ref 39–117)
Creatinine, Ser: 1.15 mg/dL (ref 0.40–1.20)
Sodium: 139 mEq/L (ref 135–145)
Total Bilirubin: 0.4 mg/dL (ref 0.3–1.2)
Total Protein: 6.2 g/dL (ref 6.0–8.3)

## 2010-06-08 LAB — CBC WITH DIFFERENTIAL/PLATELET
BASO%: 0.3 % (ref 0.0–2.0)
EOS%: 2 % (ref 0.0–7.0)
LYMPH%: 16.9 % (ref 14.0–49.7)
MCH: 32.5 pg (ref 25.1–34.0)
MCHC: 34.1 g/dL (ref 31.5–36.0)
MONO#: 0.4 10*3/uL (ref 0.1–0.9)
MONO%: 11 % (ref 0.0–14.0)
Platelets: 248 10*3/uL (ref 145–400)
RBC: 2.8 10*6/uL — ABNORMAL LOW (ref 3.70–5.45)
WBC: 3.8 10*3/uL — ABNORMAL LOW (ref 3.9–10.3)

## 2010-06-15 ENCOUNTER — Encounter (HOSPITAL_BASED_OUTPATIENT_CLINIC_OR_DEPARTMENT_OTHER): Payer: Medicare Other | Admitting: Oncology

## 2010-06-15 ENCOUNTER — Other Ambulatory Visit: Payer: Self-pay | Admitting: Oncology

## 2010-06-15 DIAGNOSIS — Z5111 Encounter for antineoplastic chemotherapy: Secondary | ICD-10-CM

## 2010-06-15 DIAGNOSIS — C569 Malignant neoplasm of unspecified ovary: Secondary | ICD-10-CM

## 2010-06-15 LAB — CBC WITH DIFFERENTIAL/PLATELET
BASO%: 0.3 % (ref 0.0–2.0)
Basophils Absolute: 0 10*3/uL (ref 0.0–0.1)
EOS%: 0.9 % (ref 0.0–7.0)
HCT: 25.8 % — ABNORMAL LOW (ref 34.8–46.6)
HGB: 8.6 g/dL — ABNORMAL LOW (ref 11.6–15.9)
LYMPH%: 27.5 % (ref 14.0–49.7)
MCH: 31.3 pg (ref 25.1–34.0)
MCHC: 33.3 g/dL (ref 31.5–36.0)
NEUT%: 53.5 % (ref 38.4–76.8)
Platelets: 225 10*3/uL (ref 145–400)

## 2010-06-23 ENCOUNTER — Other Ambulatory Visit: Payer: Self-pay | Admitting: Oncology

## 2010-06-23 ENCOUNTER — Encounter (HOSPITAL_BASED_OUTPATIENT_CLINIC_OR_DEPARTMENT_OTHER): Payer: Medicare Other | Admitting: Oncology

## 2010-06-23 DIAGNOSIS — Z5111 Encounter for antineoplastic chemotherapy: Secondary | ICD-10-CM

## 2010-06-23 DIAGNOSIS — C569 Malignant neoplasm of unspecified ovary: Secondary | ICD-10-CM

## 2010-06-23 LAB — CBC WITH DIFFERENTIAL/PLATELET
Basophils Absolute: 0 10*3/uL (ref 0.0–0.1)
EOS%: 0.4 % (ref 0.0–7.0)
Eosinophils Absolute: 0 10*3/uL (ref 0.0–0.5)
HGB: 8.5 g/dL — ABNORMAL LOW (ref 11.6–15.9)
LYMPH%: 19.5 % (ref 14.0–49.7)
MCH: 33.5 pg (ref 25.1–34.0)
MCV: 97 fL (ref 79.5–101.0)
MONO%: 15.2 % — ABNORMAL HIGH (ref 0.0–14.0)
NEUT#: 3 10*3/uL (ref 1.5–6.5)
Platelets: 57 10*3/uL — ABNORMAL LOW (ref 145–400)
RBC: 2.54 10*6/uL — ABNORMAL LOW (ref 3.70–5.45)
RDW: 18.7 % — ABNORMAL HIGH (ref 11.2–14.5)

## 2010-06-28 ENCOUNTER — Encounter (HOSPITAL_BASED_OUTPATIENT_CLINIC_OR_DEPARTMENT_OTHER): Payer: Medicare Other | Admitting: Oncology

## 2010-06-28 ENCOUNTER — Other Ambulatory Visit: Payer: Self-pay | Admitting: Oncology

## 2010-06-28 DIAGNOSIS — R609 Edema, unspecified: Secondary | ICD-10-CM

## 2010-06-28 DIAGNOSIS — C569 Malignant neoplasm of unspecified ovary: Secondary | ICD-10-CM

## 2010-06-28 LAB — CBC WITH DIFFERENTIAL/PLATELET
BASO%: 0.2 % (ref 0.0–2.0)
EOS%: 0.4 % (ref 0.0–7.0)
HCT: 23.8 % — ABNORMAL LOW (ref 34.8–46.6)
LYMPH%: 13.3 % — ABNORMAL LOW (ref 14.0–49.7)
MCH: 33.8 pg (ref 25.1–34.0)
MCHC: 34.2 g/dL (ref 31.5–36.0)
MONO%: 15.6 % — ABNORMAL HIGH (ref 0.0–14.0)
NEUT%: 70.5 % (ref 38.4–76.8)
Platelets: 126 10*3/uL — ABNORMAL LOW (ref 145–400)
RBC: 2.4 10*6/uL — ABNORMAL LOW (ref 3.70–5.45)
WBC: 5.6 10*3/uL (ref 3.9–10.3)

## 2010-06-28 LAB — COMPREHENSIVE METABOLIC PANEL
ALT: 21 U/L (ref 0–35)
AST: 22 U/L (ref 0–37)
Alkaline Phosphatase: 94 U/L (ref 39–117)
CO2: 28 mEq/L (ref 19–32)
Creatinine, Ser: 1.19 mg/dL (ref 0.40–1.20)
Sodium: 140 mEq/L (ref 135–145)
Total Bilirubin: 0.7 mg/dL (ref 0.3–1.2)
Total Protein: 6.3 g/dL (ref 6.0–8.3)

## 2010-06-29 LAB — CA 125: CA 125: 42.2 U/mL — ABNORMAL HIGH (ref 0.0–30.2)

## 2010-06-30 ENCOUNTER — Encounter (HOSPITAL_COMMUNITY)
Admission: RE | Admit: 2010-06-30 | Discharge: 2010-06-30 | Disposition: A | Discharge status: Home / Self Care | Payer: Medicare Other | Source: Ambulatory Visit | Attending: Oncology | Admitting: Oncology

## 2010-06-30 ENCOUNTER — Encounter (HOSPITAL_BASED_OUTPATIENT_CLINIC_OR_DEPARTMENT_OTHER): Payer: Medicare Other | Admitting: Oncology

## 2010-06-30 ENCOUNTER — Other Ambulatory Visit: Payer: Self-pay | Admitting: Oncology

## 2010-06-30 DIAGNOSIS — Z5111 Encounter for antineoplastic chemotherapy: Secondary | ICD-10-CM

## 2010-06-30 DIAGNOSIS — Z452 Encounter for adjustment and management of vascular access device: Secondary | ICD-10-CM

## 2010-06-30 DIAGNOSIS — D649 Anemia, unspecified: Secondary | ICD-10-CM | POA: Insufficient documentation

## 2010-06-30 DIAGNOSIS — C569 Malignant neoplasm of unspecified ovary: Secondary | ICD-10-CM

## 2010-06-30 LAB — CBC WITH DIFFERENTIAL/PLATELET
Basophils Absolute: 0 10*3/uL (ref 0.0–0.1)
EOS%: 0.4 % (ref 0.0–7.0)
HCT: 26.2 % — ABNORMAL LOW (ref 34.8–46.6)
HGB: 8.5 g/dL — ABNORMAL LOW (ref 11.6–15.9)
LYMPH%: 14.3 % (ref 14.0–49.7)
MCH: 32.2 pg (ref 25.1–34.0)
MCHC: 32.4 g/dL (ref 31.5–36.0)
NEUT%: 72.3 % (ref 38.4–76.8)
Platelets: 155 10*3/uL (ref 145–400)
lymph#: 1 10*3/uL (ref 0.9–3.3)

## 2010-06-30 LAB — ABO/RH: ABO/RH(D): A POS

## 2010-07-01 LAB — TYPE & CROSSMATCH - CHCC

## 2010-07-02 ENCOUNTER — Encounter (HOSPITAL_BASED_OUTPATIENT_CLINIC_OR_DEPARTMENT_OTHER): Payer: Medicare Other | Admitting: Oncology

## 2010-07-02 DIAGNOSIS — D649 Anemia, unspecified: Secondary | ICD-10-CM

## 2010-07-03 LAB — CROSSMATCH
Antibody Screen: NEGATIVE
Unit division: 0

## 2010-07-05 ENCOUNTER — Encounter (HOSPITAL_COMMUNITY): Payer: Medicare Other

## 2010-07-06 ENCOUNTER — Other Ambulatory Visit: Payer: Self-pay | Admitting: Oncology

## 2010-07-06 ENCOUNTER — Encounter (HOSPITAL_BASED_OUTPATIENT_CLINIC_OR_DEPARTMENT_OTHER): Payer: Medicare Other | Admitting: Oncology

## 2010-07-06 DIAGNOSIS — Z5111 Encounter for antineoplastic chemotherapy: Secondary | ICD-10-CM

## 2010-07-06 DIAGNOSIS — C569 Malignant neoplasm of unspecified ovary: Secondary | ICD-10-CM

## 2010-07-06 LAB — CBC WITH DIFFERENTIAL/PLATELET
BASO%: 0.6 % (ref 0.0–2.0)
EOS%: 0.5 % (ref 0.0–7.0)
MCH: 32.6 pg (ref 25.1–34.0)
MCHC: 34.3 g/dL (ref 31.5–36.0)
RDW: 19.3 % — ABNORMAL HIGH (ref 11.2–14.5)
WBC: 2 10*3/uL — ABNORMAL LOW (ref 3.9–10.3)
lymph#: 0.6 10*3/uL — ABNORMAL LOW (ref 0.9–3.3)

## 2010-07-06 LAB — COMPREHENSIVE METABOLIC PANEL
ALT: 32 U/L (ref 0–35)
AST: 31 U/L (ref 0–37)
Albumin: 3.7 g/dL (ref 3.5–5.2)
Calcium: 9.1 mg/dL (ref 8.4–10.5)
Chloride: 101 mEq/L (ref 96–112)
Potassium: 4.3 mEq/L (ref 3.5–5.3)
Sodium: 136 mEq/L (ref 135–145)
Total Protein: 6.4 g/dL (ref 6.0–8.3)

## 2010-07-13 ENCOUNTER — Other Ambulatory Visit: Payer: Self-pay | Admitting: Oncology

## 2010-07-13 ENCOUNTER — Encounter (HOSPITAL_BASED_OUTPATIENT_CLINIC_OR_DEPARTMENT_OTHER): Payer: Medicare Other | Admitting: Oncology

## 2010-07-13 DIAGNOSIS — Z5111 Encounter for antineoplastic chemotherapy: Secondary | ICD-10-CM

## 2010-07-13 DIAGNOSIS — Z452 Encounter for adjustment and management of vascular access device: Secondary | ICD-10-CM

## 2010-07-13 DIAGNOSIS — C569 Malignant neoplasm of unspecified ovary: Secondary | ICD-10-CM

## 2010-07-13 LAB — CBC WITH DIFFERENTIAL/PLATELET
BASO%: 0.1 % (ref 0.0–2.0)
EOS%: 0.1 % (ref 0.0–7.0)
HCT: 25.7 % — ABNORMAL LOW (ref 34.8–46.6)
MCH: 33.4 pg (ref 25.1–34.0)
MCHC: 35.2 g/dL (ref 31.5–36.0)
NEUT%: 63.3 % (ref 38.4–76.8)
lymph#: 0.8 10*3/uL — ABNORMAL LOW (ref 0.9–3.3)

## 2010-07-20 ENCOUNTER — Encounter (HOSPITAL_BASED_OUTPATIENT_CLINIC_OR_DEPARTMENT_OTHER): Payer: Medicare Other | Admitting: Oncology

## 2010-07-20 ENCOUNTER — Other Ambulatory Visit: Payer: Self-pay | Admitting: Oncology

## 2010-07-20 DIAGNOSIS — Z5111 Encounter for antineoplastic chemotherapy: Secondary | ICD-10-CM

## 2010-07-20 DIAGNOSIS — Z452 Encounter for adjustment and management of vascular access device: Secondary | ICD-10-CM

## 2010-07-20 DIAGNOSIS — C569 Malignant neoplasm of unspecified ovary: Secondary | ICD-10-CM

## 2010-07-20 LAB — CBC WITH DIFFERENTIAL/PLATELET
BASO%: 0.3 % (ref 0.0–2.0)
Basophils Absolute: 0 10*3/uL (ref 0.0–0.1)
EOS%: 0.7 % (ref 0.0–7.0)
Eosinophils Absolute: 0 10*3/uL (ref 0.0–0.5)
HCT: 26.2 % — ABNORMAL LOW (ref 34.8–46.6)
HGB: 8.7 g/dL — ABNORMAL LOW (ref 11.6–15.9)
LYMPH%: 22.5 % (ref 14.0–49.7)
MCH: 31.6 pg (ref 25.1–34.0)
MCHC: 33.2 g/dL (ref 31.5–36.0)
MCV: 95.3 fL (ref 79.5–101.0)
MONO#: 0.5 10*3/uL (ref 0.1–0.9)
MONO%: 17.8 % — ABNORMAL HIGH (ref 0.0–14.0)
NEUT#: 1.8 10*3/uL (ref 1.5–6.5)
NEUT%: 58.7 % (ref 38.4–76.8)
Platelets: 130 10*3/uL — ABNORMAL LOW (ref 145–400)
RBC: 2.75 10*6/uL — ABNORMAL LOW (ref 3.70–5.45)
RDW: 19.6 % — ABNORMAL HIGH (ref 11.2–14.5)
WBC: 3 10*3/uL — ABNORMAL LOW (ref 3.9–10.3)
lymph#: 0.7 10*3/uL — ABNORMAL LOW (ref 0.9–3.3)

## 2010-07-20 LAB — COMPREHENSIVE METABOLIC PANEL
ALT: 13 U/L (ref 0–35)
Albumin: 3.8 g/dL (ref 3.5–5.2)
CO2: 24 mEq/L (ref 19–32)
Calcium: 8.9 mg/dL (ref 8.4–10.5)
Chloride: 104 mEq/L (ref 96–112)
Potassium: 4.2 mEq/L (ref 3.5–5.3)
Sodium: 138 mEq/L (ref 135–145)
Total Protein: 5.8 g/dL — ABNORMAL LOW (ref 6.0–8.3)

## 2010-07-20 LAB — CA 125: CA 125: 17.4 U/mL (ref 0.0–30.2)

## 2010-07-27 ENCOUNTER — Other Ambulatory Visit: Payer: Self-pay | Admitting: Oncology

## 2010-07-27 ENCOUNTER — Encounter (HOSPITAL_BASED_OUTPATIENT_CLINIC_OR_DEPARTMENT_OTHER): Payer: Medicare Other | Admitting: Oncology

## 2010-07-27 DIAGNOSIS — T451X5A Adverse effect of antineoplastic and immunosuppressive drugs, initial encounter: Secondary | ICD-10-CM

## 2010-07-27 DIAGNOSIS — C569 Malignant neoplasm of unspecified ovary: Secondary | ICD-10-CM

## 2010-07-27 DIAGNOSIS — Z5111 Encounter for antineoplastic chemotherapy: Secondary | ICD-10-CM

## 2010-07-27 DIAGNOSIS — D6481 Anemia due to antineoplastic chemotherapy: Secondary | ICD-10-CM

## 2010-07-27 LAB — CBC WITH DIFFERENTIAL/PLATELET
BASO%: 0.7 % (ref 0.0–2.0)
EOS%: 0.7 % (ref 0.0–7.0)
HCT: 24.5 % — ABNORMAL LOW (ref 34.8–46.6)
MCH: 31.6 pg (ref 25.1–34.0)
MCHC: 33.1 g/dL (ref 31.5–36.0)
NEUT%: 53.4 % (ref 38.4–76.8)
RBC: 2.56 10*6/uL — ABNORMAL LOW (ref 3.70–5.45)
RDW: 19.3 % — ABNORMAL HIGH (ref 11.2–14.5)
lymph#: 1 10*3/uL (ref 0.9–3.3)

## 2010-07-27 LAB — HOLD TUBE, BLOOD BANK

## 2010-07-27 LAB — TYPE & CROSSMATCH - CHCC

## 2010-07-28 ENCOUNTER — Encounter (HOSPITAL_BASED_OUTPATIENT_CLINIC_OR_DEPARTMENT_OTHER): Payer: Medicare Other | Admitting: Oncology

## 2010-07-28 DIAGNOSIS — D649 Anemia, unspecified: Secondary | ICD-10-CM

## 2010-07-29 LAB — CROSSMATCH: Unit division: 0

## 2010-08-10 ENCOUNTER — Other Ambulatory Visit: Payer: Self-pay | Admitting: Oncology

## 2010-08-10 ENCOUNTER — Encounter (HOSPITAL_BASED_OUTPATIENT_CLINIC_OR_DEPARTMENT_OTHER): Payer: Medicare Other | Admitting: Oncology

## 2010-08-10 DIAGNOSIS — Z452 Encounter for adjustment and management of vascular access device: Secondary | ICD-10-CM

## 2010-08-10 DIAGNOSIS — Z5111 Encounter for antineoplastic chemotherapy: Secondary | ICD-10-CM

## 2010-08-10 DIAGNOSIS — C569 Malignant neoplasm of unspecified ovary: Secondary | ICD-10-CM

## 2010-08-10 LAB — COMPREHENSIVE METABOLIC PANEL
BUN: 19 mg/dL (ref 6–23)
CO2: 24 mEq/L (ref 19–32)
Calcium: 9.5 mg/dL (ref 8.4–10.5)
Chloride: 103 mEq/L (ref 96–112)
Creatinine, Ser: 1.17 mg/dL — ABNORMAL HIGH (ref 0.50–1.10)
Total Bilirubin: 0.4 mg/dL (ref 0.3–1.2)

## 2010-08-10 LAB — CBC WITH DIFFERENTIAL/PLATELET
EOS%: 0.8 % (ref 0.0–7.0)
MCH: 31.1 pg (ref 25.1–34.0)
MCV: 95.2 fL (ref 79.5–101.0)
MONO%: 17.1 % — ABNORMAL HIGH (ref 0.0–14.0)
NEUT#: 3.3 10*3/uL (ref 1.5–6.5)
RBC: 2.93 10*6/uL — ABNORMAL LOW (ref 3.70–5.45)
RDW: 20.1 % — ABNORMAL HIGH (ref 11.2–14.5)
nRBC: 0 % (ref 0–0)

## 2010-08-10 LAB — CA 125: CA 125: 15.2 U/mL (ref 0.0–30.2)

## 2010-08-17 ENCOUNTER — Encounter (HOSPITAL_BASED_OUTPATIENT_CLINIC_OR_DEPARTMENT_OTHER): Payer: Medicare Other | Admitting: Oncology

## 2010-08-17 ENCOUNTER — Other Ambulatory Visit: Payer: Self-pay | Admitting: Oncology

## 2010-08-17 DIAGNOSIS — Z5111 Encounter for antineoplastic chemotherapy: Secondary | ICD-10-CM

## 2010-08-17 DIAGNOSIS — C569 Malignant neoplasm of unspecified ovary: Secondary | ICD-10-CM

## 2010-08-17 DIAGNOSIS — Z452 Encounter for adjustment and management of vascular access device: Secondary | ICD-10-CM

## 2010-08-17 LAB — CBC WITH DIFFERENTIAL/PLATELET
Basophils Absolute: 0 10*3/uL (ref 0.0–0.1)
EOS%: 0.8 % (ref 0.0–7.0)
Eosinophils Absolute: 0 10*3/uL (ref 0.0–0.5)
HCT: 28.3 % — ABNORMAL LOW (ref 34.8–46.6)
HGB: 9.5 g/dL — ABNORMAL LOW (ref 11.6–15.9)
MONO#: 0.2 10*3/uL (ref 0.1–0.9)
NEUT#: 1.3 10*3/uL — ABNORMAL LOW (ref 1.5–6.5)
NEUT%: 53.7 % (ref 38.4–76.8)
RDW: 19.6 % — ABNORMAL HIGH (ref 11.2–14.5)
WBC: 2.4 10*3/uL — ABNORMAL LOW (ref 3.9–10.3)
lymph#: 0.8 10*3/uL — ABNORMAL LOW (ref 0.9–3.3)

## 2010-08-31 ENCOUNTER — Encounter (HOSPITAL_COMMUNITY)
Admission: RE | Admit: 2010-08-31 | Discharge: 2010-08-31 | Disposition: A | Discharge status: Home / Self Care | Payer: Medicare Other | Source: Ambulatory Visit | Attending: Oncology | Admitting: Oncology

## 2010-08-31 ENCOUNTER — Other Ambulatory Visit: Payer: Self-pay | Admitting: Oncology

## 2010-08-31 ENCOUNTER — Encounter (HOSPITAL_BASED_OUTPATIENT_CLINIC_OR_DEPARTMENT_OTHER): Payer: Medicare Other | Admitting: Oncology

## 2010-08-31 DIAGNOSIS — C569 Malignant neoplasm of unspecified ovary: Secondary | ICD-10-CM

## 2010-08-31 DIAGNOSIS — D649 Anemia, unspecified: Secondary | ICD-10-CM | POA: Insufficient documentation

## 2010-08-31 DIAGNOSIS — Z452 Encounter for adjustment and management of vascular access device: Secondary | ICD-10-CM

## 2010-08-31 DIAGNOSIS — Z5111 Encounter for antineoplastic chemotherapy: Secondary | ICD-10-CM

## 2010-08-31 LAB — CBC WITH DIFFERENTIAL/PLATELET
Basophils Absolute: 0 10*3/uL (ref 0.0–0.1)
Eosinophils Absolute: 0 10*3/uL (ref 0.0–0.5)
HCT: 24.8 % — ABNORMAL LOW (ref 34.8–46.6)
HGB: 7.8 g/dL — ABNORMAL LOW (ref 11.6–15.9)
LYMPH%: 12.7 % — ABNORMAL LOW (ref 14.0–49.7)
MONO#: 0.9 10*3/uL (ref 0.1–0.9)
NEUT#: 2.7 10*3/uL (ref 1.5–6.5)
Platelets: 117 10*3/uL — ABNORMAL LOW (ref 145–400)
RBC: 2.44 10*6/uL — ABNORMAL LOW (ref 3.70–5.45)
WBC: 4.2 10*3/uL (ref 3.9–10.3)

## 2010-08-31 LAB — COMPREHENSIVE METABOLIC PANEL
ALT: 12 U/L (ref 0–35)
AST: 21 U/L (ref 0–37)
Alkaline Phosphatase: 92 U/L (ref 39–117)
BUN: 17 mg/dL (ref 6–23)
Creatinine, Ser: 1.11 mg/dL — ABNORMAL HIGH (ref 0.50–1.10)
Potassium: 4.2 mEq/L (ref 3.5–5.3)

## 2010-08-31 LAB — TYPE & CROSSMATCH - CHCC

## 2010-09-01 LAB — CROSSMATCH
ABO/RH(D): A POS
Unit division: 0
Unit division: 0

## 2010-09-21 IMAGING — RF DG INTRO LONG GI TUBE
1 series · 1 of 1 positions shown · IV contrast (agent unspecified)
Comparison: 03/23/2009

CLINICAL DATA: Feeding tube placement.

LONG GI TUBE PLACMENT
Fluoroscopy Time: 8.9 minutes
Contrast: 20 ml [id number]

[Series 1: run · 1 of 1 slices shown]
[im 1/1]
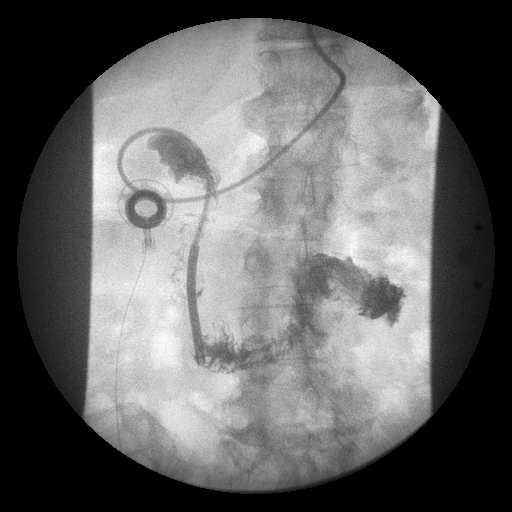

[1 of 1 positions shown; findings below may reference images not displayed]

FINDINGS: A feeding tube was placed under fluoroscopic guidance.  A
large hiatal hernia again made placement difficult.  However, the
tube was placed to the junction of the descending and transverse
duodenum.  Small amount of contrast confirms location.  Expect
peristalsis to move the tube to the duodenal jejunal junction.
IMPRESSION: Feeding tube placed to the descending/transverse duodenal
junction.

## 2010-10-05 ENCOUNTER — Other Ambulatory Visit: Payer: Self-pay | Admitting: Oncology

## 2010-10-05 ENCOUNTER — Encounter (HOSPITAL_BASED_OUTPATIENT_CLINIC_OR_DEPARTMENT_OTHER): Payer: Medicare Other | Admitting: Oncology

## 2010-10-05 DIAGNOSIS — C569 Malignant neoplasm of unspecified ovary: Secondary | ICD-10-CM

## 2010-10-05 LAB — COMPREHENSIVE METABOLIC PANEL
ALT: 12 U/L (ref 0–35)
AST: 22 U/L (ref 0–37)
Albumin: 4.1 g/dL (ref 3.5–5.2)
CO2: 25 mEq/L (ref 19–32)
Calcium: 10 mg/dL (ref 8.4–10.5)
Chloride: 104 mEq/L (ref 96–112)
Potassium: 4.3 mEq/L (ref 3.5–5.3)
Total Protein: 6.2 g/dL (ref 6.0–8.3)

## 2010-10-05 LAB — CBC WITH DIFFERENTIAL/PLATELET
Basophils Absolute: 0 10*3/uL (ref 0.0–0.1)
Eosinophils Absolute: 0.2 10*3/uL (ref 0.0–0.5)
HGB: 12 g/dL (ref 11.6–15.9)
LYMPH%: 13.9 % — ABNORMAL LOW (ref 14.0–49.7)
MCV: 99.7 fL (ref 79.5–101.0)
MONO%: 12.8 % (ref 0.0–14.0)
NEUT#: 4.4 10*3/uL (ref 1.5–6.5)
NEUT%: 69.6 % (ref 38.4–76.8)
Platelets: 132 10*3/uL — ABNORMAL LOW (ref 145–400)

## 2011-03-11 ENCOUNTER — Ambulatory Visit (HOSPITAL_BASED_OUTPATIENT_CLINIC_OR_DEPARTMENT_OTHER): Payer: Medicare Other

## 2011-03-11 DIAGNOSIS — C569 Malignant neoplasm of unspecified ovary: Secondary | ICD-10-CM

## 2011-03-11 MED ORDER — HEPARIN SOD (PORK) LOCK FLUSH 100 UNIT/ML IV SOLN
500.0000 [IU] | Freq: Once | INTRAVENOUS | Status: AC
  Administered 2011-03-11: 500 [IU] via INTRAVENOUS
  Filled 2011-03-11: qty 5

## 2011-03-11 MED ORDER — SODIUM CHLORIDE 0.9 % IJ SOLN
10.0000 mL | INTRAMUSCULAR | Status: DC | PRN
  Administered 2011-03-11: 10 mL via INTRAVENOUS
  Filled 2011-03-11: qty 10

## 2011-04-20 ENCOUNTER — Other Ambulatory Visit: Payer: Self-pay | Admitting: *Deleted

## 2011-04-20 ENCOUNTER — Telehealth: Payer: Self-pay | Admitting: *Deleted

## 2011-04-20 DIAGNOSIS — C569 Malignant neoplasm of unspecified ovary: Secondary | ICD-10-CM

## 2011-04-20 NOTE — Telephone Encounter (Signed)
Debbie called asking for patient to have a ca-125 lab draw done at [facility] since she lives in [place].  This test was ordered there but was lost.  Will fax order request.  [facility] schedulers will call patient.  Will notify providers.

## 2011-04-21 ENCOUNTER — Telehealth: Payer: Self-pay | Admitting: Oncology

## 2011-04-21 ENCOUNTER — Other Ambulatory Visit: Payer: Self-pay | Admitting: *Deleted

## 2011-04-21 NOTE — Telephone Encounter (Signed)
The pt's dtr called to get the feb 2013 appts

## 2011-04-22 ENCOUNTER — Other Ambulatory Visit: Payer: Medicare Other | Admitting: Lab

## 2011-04-22 DIAGNOSIS — C569 Malignant neoplasm of unspecified ovary: Secondary | ICD-10-CM

## 2011-04-23 ENCOUNTER — Other Ambulatory Visit: Payer: Self-pay | Admitting: Oncology

## 2011-04-25 ENCOUNTER — Telehealth: Payer: Self-pay | Admitting: Oncology

## 2011-04-25 NOTE — Telephone Encounter (Signed)
lmonvm adviising the pt of her appt on 04/26/2011 @5 :00pm

## 2011-04-26 ENCOUNTER — Ambulatory Visit (HOSPITAL_BASED_OUTPATIENT_CLINIC_OR_DEPARTMENT_OTHER): Payer: Medicare Other | Admitting: Oncology

## 2011-04-26 VITALS — BP 168/92 | HR 79 | Temp 98.0°F | Ht 66.0 in | Wt 158.9 lb

## 2011-04-26 DIAGNOSIS — C569 Malignant neoplasm of unspecified ovary: Secondary | ICD-10-CM | POA: Insufficient documentation

## 2011-04-26 NOTE — Progress Notes (Signed)
ID: Regina West   DOB: 11/12/1929  MR#: [id number]  WUJ#:[id number]  HISTORY OF PRESENT ILLNESS: The patient originally presented in the summer of 2010 with cramps and abdominal distension.  I do not have a copy of the initial evaluation, but on November 26, 2008, the patient underwent optimal debulking with bilateral salpingo-oophorectomy, omentectomy, and the placement of an intraperitoneal port.  The pathology from that procedure (Accession Number [id number] at [facility]) showed first of all, significant involvement of the omentum, minimal involvement of the right ovary and right fallopian tube and negative on the left ovary and fallopian tube, with neither ovary being enlarged, consistent with a primary peritoneal serous adenocarcinoma, described as moderately differentiated.  The sample included no lymph nodes.    The patient had an intraperitoneal port placed in the same surgery and was treated with intraperitoneal and IV chemotherapy according to GOG-252 but I am not sure which arm she was in.  (Arm 2 did carboplatin intraperitoneally and Taxol IV.  Arm 3 did cisplatin and paclitaxel intraperitoneally with paclitaxel IV.)  All arms received bevacizumab.  Unfortunately, after 4 cycles of treatment, she had acute mental status changes and was admitted here January of 2011 with what proved to be posterior reversible leukoencephalopathy and SIADH.  She had seizures, aphasia, and required intubation.  Once the patient recovered from this, she was treated with 2 cycles of single-agent carboplatin (I do not have the AUC). Her last adjuvant treatment was May 12, 2009, and her CA-125 at that time was 10.3.  Her intraperitoneal port was removed in April of 2011.    More recently, the patient was in routine follow up when her CA-125 was found to jump up to 2,269.7 (March 26, 2010).  This is 10 months after her last prior chemotherapy.  She had CTs of the chest, abdomen and pelvis January 26th which showed  ascites and enhancing peritoneal nodularity. She had had similar findings at presentation but these had completely resolved by the time she finished treatment in March of 2011.    The patient was felt to be in first relapse. Subsequent treatments are as summarized below   INTERVAL HISTORY: She had an appointment here late October 2012 but "too many things were happening around that time" and she did not show. More recently she has been having some symptoms and she called requesting a repeat CA 125, which came back at 309.7 (04/22/2011)  REVIEW OF SYSTEMS: She's gained some weight, and has been having more pain in the right groin/pelvic area. However she has been having discomfort in the abdomen at least since September of 2012. This has been intermittent. She just put hasn't felt well" recently. She has been a little bit depressed because of this. Aside from all these issues, she had cataract surgery of 04/12/2011 and is scheduled for the contralateral eye 05/10/2011. She has minimal urinary incontinence which really is no different from baseline. She is very irregular bowel movements, again not a change from baseline. She has grade 1 of numbness in her fingertips, which is stable, boat comes and goes", is not uncomfortable, and not does not cause her any difficulty with her activities of daily living. A detailed review of systems was otherwise noncontributory  PAST MEDICAL HISTORY: Significant for hysterectomy at the age of 53 with concurrent bladder "tuck up."  She also underwent appendectomy during that procedure.  She is status post prior bilateral cataract surgery. She is status post tonsillectomy and adenoidectomy.  She has a history  of hypertension and diabetes. She has a history of diverticulosis.  She has a large hiatal hernia.  She has degenerative disk disease.  She has been noted to have coronary calcifications and she has hyperlipidemia.  There is a history of remote tobacco abuse. Seizure  disorder as per HPI  FAMILY HISTORY The patient's father died at the age of 104 after heart surgery for aortic stenosis.  The patient's mother died at the age of 57.  The patient has one brother in good health.  There is no other family history to her knowledge of ovarian or breast cancer.    GYNECOLOGIC HISTORY: Menarche at age 6.  She is GX P3 with menopause in her late 74s.  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  Her husband was in sales and had a history of Parkinson's disease.  He died following a fall in the year 2000.  The patient's son Regina West lives in [place], and has a history of MS.  Daughter Regina West lives in [place].  She is a Futures trader.  Son Regina West lives in [place], [place] and is in Airline pilot. The patient has 8 grandchildren. She attends the [employer].  She is a good friend of our patients [name]   ADVANCED DIRECTIVES: in place  HEALTH MAINTENANCE: History  Substance Use Topics  . Smoking status: Not on file  . Smokeless tobacco: Not on file  . Alcohol Use: Not on file     Colonoscopy: 2008  PAP: UTD  MM: refuses  No Known Allergies  Current Outpatient Prescriptions  Medication Sig Dispense Refill  . cholecalciferol (VITAMIN D) 1000 UNITS tablet Take 1,000 Units by mouth 2 (two) times daily.      Marland Kitchen docusate sodium (COLACE) 100 MG capsule Take 100 mg by mouth 2 (two) times daily.      Marland Kitchen levETIRAcetam (KEPPRA) 250 MG tablet Take 250 mg by mouth every 12 (twelve) hours.        OBJECTIVE: Elderly white woman who appears slightly anxious Filed Vitals:   04/26/11 1659  BP: 168/92  Pulse: 79  Temp: 98 F (36.7 C)     Body mass index is 25.65 kg/(m^2).    ECOG FS: 1  Sclerae unicteric Oropharynx clear No peripheral adenopathy Lungs no rales or rhonchi Heart regular rate and rhythm Abd minimally distended, soft, positive  bowel sounds. There is mild tenderness to palpation in the right upper quadrant only. No masses or organomegaly. MSK no focal spinal tenderness, no peripheral edema Neuro: nonfocal Breasts: Deferred  LAB RESULTS:  Lab Results  Component Value Date   CA125 309.7* 04/22/2011   Lab Results  Component Value Date   WBC 6.3 10/05/2010   NEUTROABS 4.4 10/05/2010   HGB 12.0 10/05/2010   HCT 34.8 10/05/2010   MCV 99.7 10/05/2010   PLT 132* 10/05/2010      Chemistry      Component Value Date/Time   NA 141 10/05/2010 0737   NA 141 10/05/2010 0737   K 4.3 10/05/2010 0737   K 4.3 10/05/2010 0737   CL 104 10/05/2010 0737   CL 104 10/05/2010 0737   CO2 25 10/05/2010 0737   CO2 25 10/05/2010 0737   BUN 27* 10/05/2010 0737   BUN 27* 10/05/2010 0737   CREATININE 1.11* 10/05/2010 0737   CREATININE 1.11* 10/05/2010 1324  Component Value Date/Time   CALCIUM 10.0 10/05/2010 0737   CALCIUM 10.0 10/05/2010 0737   ALKPHOS 95 10/05/2010 0737   ALKPHOS 95 10/05/2010 0737   AST 22 10/05/2010 0737   AST 22 10/05/2010 0737   ALT 12 10/05/2010 0737   ALT 12 10/05/2010 0737   BILITOT 0.5 10/05/2010 0737   BILITOT 0.5 10/05/2010 0737       No results found for this basename: INR:1;PROTIME:1 in the last 168 hours  No results found for this basename: UACOL:1,UAPR:1,USPG:1,UPH:1,UTP:1,UGL:1,UKET:1,UBIL:1,UHGB:1,UNIT:1,UROB:1,ULEU:1,UEPI:1,UWBC:1,URBC:1,UBAC:1,CAST:1,CRYS:1,UCOM:1,BILUA:1 in the last 72 hours   STUDIES: No new results found.  ASSESSMENT: 78 year old [place] woman   (1) status post optimal debulking of a primary peritoneal serous adenocarcinoma September 2010, the tumor being moderately differentiated, pT3c NX (stage IIIC), treated according to GOG 252 with  intraperitoneal platinum and paclitaxel x4 given along with bevacizumab, complicated by SIADHafter cycle 4, also with posterior reversible leukoencephalopathy.  After these problems resolved she received 2 additional cycles of single-agent  carboplatin completed in March 2011  (2)  She had her first recurrence February 2012  treated with carboplatin and Gemzar for a total of 6 cycles between February and June 2012.  Her CEA-125 had normalized before the beginning of cycle 5.   PLAN: The cancer is likely in its second recurrence. We will confirm this with CT scans scheduled later this week. Although technically this is being documented more than 6 months since her last platinum treatment, in fact she was having symptoms as early as 3 months after completing carboplatin last year.  Given the uncertainty my vote would be to not use platinum at this point but consider Abraxane as single agent, the goal being to bring her back into remission with a minimum of side effects. She is scheduled for a second cataract operation and is hoping to be able to move that up so that the chemotherapy will not have to be delayed. I have made her a return appointment here for march 11th, with a target starting date being March 12. That will also give [place] time to get Dr. Marigene Ehlers input before starting therapy.   [place] Christine C    04/26/2011

## 2011-04-27 ENCOUNTER — Telehealth: Payer: Self-pay | Admitting: *Deleted

## 2011-04-27 NOTE — Telephone Encounter (Signed)
made patient appointment for ct abdomen and ct chest arrival time is 2:00pm

## 2011-05-02 ENCOUNTER — Other Ambulatory Visit: Payer: Self-pay | Admitting: Oncology

## 2011-05-02 ENCOUNTER — Ambulatory Visit (HOSPITAL_BASED_OUTPATIENT_CLINIC_OR_DEPARTMENT_OTHER): Payer: Medicare Other | Admitting: Lab

## 2011-05-02 ENCOUNTER — Ambulatory Visit (HOSPITAL_COMMUNITY)
Admission: RE | Admit: 2011-05-02 | Discharge: 2011-05-02 | Disposition: A | Discharge status: Home / Self Care | Payer: Medicare Other | Source: Ambulatory Visit | Attending: Oncology | Admitting: Oncology

## 2011-05-02 ENCOUNTER — Other Ambulatory Visit: Payer: Self-pay | Admitting: *Deleted

## 2011-05-02 DIAGNOSIS — C569 Malignant neoplasm of unspecified ovary: Secondary | ICD-10-CM

## 2011-05-02 DIAGNOSIS — K449 Diaphragmatic hernia without obstruction or gangrene: Secondary | ICD-10-CM | POA: Insufficient documentation

## 2011-05-02 DIAGNOSIS — J90 Pleural effusion, not elsewhere classified: Secondary | ICD-10-CM | POA: Insufficient documentation

## 2011-05-02 DIAGNOSIS — K5730 Diverticulosis of large intestine without perforation or abscess without bleeding: Secondary | ICD-10-CM | POA: Insufficient documentation

## 2011-05-02 LAB — CBC WITH DIFFERENTIAL/PLATELET
EOS%: 2.3 % (ref 0.0–7.0)
MCH: 33.8 pg (ref 25.1–34.0)
MCHC: 34.2 g/dL (ref 31.5–36.0)
MCV: 98.9 fL (ref 79.5–101.0)
MONO%: 11.2 % (ref 0.0–14.0)
RBC: 3.63 10*6/uL — ABNORMAL LOW (ref 3.70–5.45)
RDW: 12.4 % (ref 11.2–14.5)

## 2011-05-02 LAB — CA 125: CA 125: 360.8 U/mL — ABNORMAL HIGH (ref 0.0–30.2)

## 2011-05-02 LAB — CMP (CANCER CENTER ONLY)
ALT(SGPT): 33 U/L (ref 10–47)
Albumin: 3.6 g/dL (ref 3.3–5.5)
Alkaline Phosphatase: 113 U/L — ABNORMAL HIGH (ref 26–84)
Glucose, Bld: 174 mg/dL — ABNORMAL HIGH (ref 73–118)
Potassium: 4.3 mEq/L (ref 3.3–4.7)
Sodium: 144 mEq/L (ref 128–145)
Total Bilirubin: 0.9 mg/dl (ref 0.20–1.60)
Total Protein: 6.9 g/dL (ref 6.4–8.1)

## 2011-05-02 MED ORDER — IOHEXOL 300 MG/ML  SOLN
100.0000 mL | Freq: Once | INTRAMUSCULAR | Status: AC | PRN
  Administered 2011-05-02: 100 mL via INTRAVENOUS

## 2011-05-12 ENCOUNTER — Other Ambulatory Visit: Payer: Medicare Other | Admitting: Lab

## 2011-05-12 LAB — CMP (CANCER CENTER ONLY)
AST: 40 U/L — ABNORMAL HIGH (ref 11–38)
Albumin: 3.5 g/dL (ref 3.3–5.5)
Alkaline Phosphatase: 95 U/L — ABNORMAL HIGH (ref 26–84)
BUN, Bld: 19 mg/dL (ref 7–22)
Creat: 1.3 mg/dl — ABNORMAL HIGH (ref 0.6–1.2)
Glucose, Bld: 173 mg/dL — ABNORMAL HIGH (ref 73–118)
Potassium: 4.1 mEq/L (ref 3.3–4.7)
Total Bilirubin: 0.6 mg/dl (ref 0.20–1.60)

## 2011-05-16 ENCOUNTER — Ambulatory Visit (HOSPITAL_BASED_OUTPATIENT_CLINIC_OR_DEPARTMENT_OTHER): Payer: Medicare Other | Admitting: Oncology

## 2011-05-16 VITALS — BP 147/82 | HR 89 | Temp 98.9°F | Ht 66.0 in | Wt 157.3 lb

## 2011-05-16 DIAGNOSIS — C569 Malignant neoplasm of unspecified ovary: Secondary | ICD-10-CM

## 2011-05-16 DIAGNOSIS — R141 Gas pain: Secondary | ICD-10-CM

## 2011-05-16 MED ORDER — PROCHLORPERAZINE MALEATE 10 MG PO TABS: 10.0000 mg | ORAL_TABLET | Freq: Four times a day (QID) | ORAL | Status: DC | PRN

## 2011-05-16 NOTE — Progress Notes (Signed)
ID: Regina West   DOB: [birth year]  MR#: [id number]  FAO#:[id number]  HISTORY OF PRESENT ILLNESS: The patient originally presented in the summer of 2010 with cramps and abdominal distension.  I do not have a copy of the initial evaluation, but on November 26, 2008, the patient underwent optimal debulking with bilateral salpingo-oophorectomy, omentectomy, and the placement of an intraperitoneal port.  The pathology from that procedure (Accession Number [id number] at [facility]) showed first of all, significant involvement of the omentum, minimal involvement of the right ovary and right fallopian tube and negative on the left ovary and fallopian tube, with neither ovary being enlarged, consistent with a primary peritoneal serous adenocarcinoma, described as moderately differentiated.  The sample included no lymph nodes.    The patient had an intraperitoneal port placed in the same surgery and was treated with intraperitoneal and IV chemotherapy according to GOG-252 but I am not sure which arm she was in.  (Arm 2 did carboplatin intraperitoneally and Taxol IV.  Arm 3 did cisplatin and paclitaxel intraperitoneally with paclitaxel IV.)  All arms received bevacizumab.  Unfortunately, after 4 cycles of treatment, she had acute mental status changes and was admitted here January of 2011 with what proved to be posterior reversible leukoencephalopathy and SIADH.  She had seizures, aphasia, and required intubation.  Once the patient recovered from this, she was treated with 2 cycles of single-agent carboplatin (I do not have the AUC). Her last adjuvant treatment was May 12, 2009, and her CA-125 at that time was 10.3.  Her intraperitoneal port was removed in April of 2011.    More recently, the patient was in routine follow up when her CA-125 was found to jump up to 2,269.7 (March 26, 2010).  This is 10 months after her last prior chemotherapy.  She had CTs of the chest, abdomen and pelvis January 26th which showed  ascites and enhancing peritoneal nodularity. She had had similar findings at presentation but these had completely resolved by the time she finished treatment in March of 2011.    The patient was felt to be in first relapse. Subsequent treatments are as summarized below   INTERVAL HISTORY: The patient returns today with her daughter Regina West for followup of her ovarian cancer. Since her last visit here of course Regina West had her CT scan and went to see Dr. Nedra Hai at [facility]. Dr. Nedra Hai felt it was worth trying some more platinum since it was not entirely clear whether the patient relapsed before or after the standard six-month period. That makes sense I believe and so she recommended carboplatin at an AUC of 4 and Doxil at 30 mg per meter squared as per the usual q. 28 day protocol. This is operationalized below.  REVIEW OF SYSTEMS: Regina West is having more problems with abdominal distention. This has come up fairly quickly over the last few days, she tells me, and it does bother her in terms of discomfort, with some very sharp but very brief pains under the diaphragm and with some very minimal change in her breathing pattern. She tells me she had an episode of diverticular disease which was treated by her primary care physician Dr. Jacky Kindle with Flagyl and Cipro, just completed. She only had one bowel movement yesterday and it was fairly normal. Her urine is "fine", perhaps a little dark yellow. She's had some nausea and her appetite is poor. She feels a little bit depressed. Otherwise the main issue for her is the abdominal distention. A detailed review of systems  was otherwise stable  PAST MEDICAL HISTORY: Significant for hysterectomy at the age of 36 with concurrent bladder "tuck up."  She also underwent appendectomy during that procedure.  She is status post prior bilateral cataract surgery. She is status post tonsillectomy and adenoidectomy.  She has a history of hypertension and diabetes. She has a history of  diverticulosis.  She has a large hiatal hernia.  She has degenerative disk disease.  She has been noted to have coronary calcifications and she has hyperlipidemia.  There is a history of remote tobacco abuse. Seizure disorder as per HPI  FAMILY HISTORY The patient's father died at the age of 70 after heart surgery for aortic stenosis.  The patient's mother died at the age of 21.  The patient has one brother in good health.  There is no other family history to her knowledge of ovarian or breast cancer.    GYNECOLOGIC HISTORY: Menarche at age 73.  She is GX P3 with menopause in her late 54s.  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  Her husband was in sales and had a history of Parkinson's disease.  He died following a fall in the year 2000.  The patient's son Regina West lives in [place], and has a history of MS.  Daughter Regina West lives in [place].  She is a Futures trader.  Son Regina West lives in [place], [place] and is in Airline pilot. The patient has 8 grandchildren. She attends the [employer].  She is a good friend of our patients [name]   ADVANCED DIRECTIVES: in place  HEALTH MAINTENANCE: History  Substance Use Topics  . Smoking status: Not on file  . Smokeless tobacco: Not on file  . Alcohol Use: Not on file     Colonoscopy: 2008  PAP: UTD  MM: refuses  No Known Allergies  Current Outpatient Prescriptions  Medication Sig Dispense Refill  . cholecalciferol (VITAMIN D) 1000 UNITS tablet Take 1,000 Units by mouth 2 (two) times daily.      Marland Kitchen docusate sodium (COLACE) 100 MG capsule Take 100 mg by mouth 2 (two) times daily.      Marland Kitchen levETIRAcetam (KEPPRA) 250 MG tablet Take 250 mg by mouth every 12 (twelve) hours.      . prochlorperazine (COMPAZINE) 10 MG tablet Take 1 tablet (10 mg total) by mouth every 6 (six) hours as needed.  30 tablet  0    OBJECTIVE:  Elderly white woman who appears anxious Filed Vitals:   05/16/11 1417  BP: 147/82  Pulse: 89  Temp: 98.9 F (37.2 C)     Body mass index is 25.39 kg/(m^2).    ECOG FS: 2  Sclerae unicteric Oropharynx clear No peripheral adenopathy Lungs no rales or rhonchi; fair excursion bilaterally Heart regular rate and rhythm Abd distended, not terse, nontender, no organomegaly, no masses palpated.  MSK no focal spinal tenderness, no peripheral edema Neuro: nonfocal  LAB RESULTS:  Lab Results  Component Value Date   CA125 360.8* 05/02/2011   Lab Results  Component Value Date   WBC 5.1 05/02/2011   NEUTROABS 3.7 05/02/2011   HGB 12.3 05/02/2011   HCT 35.9 05/02/2011   MCV 98.9 05/02/2011   PLT 221 05/02/2011      Chemistry      Component Value Date/Time   NA 143 05/12/2011 1337   NA 141 10/05/2010 0737  NA 141 10/05/2010 0737   K 4.1 05/12/2011 1337   K 4.3 10/05/2010 0737   K 4.3 10/05/2010 0737   CL 92* 05/12/2011 1337   CL 104 10/05/2010 0737   CL 104 10/05/2010 0737   CO2 32 05/12/2011 1337   CO2 25 10/05/2010 0737   CO2 25 10/05/2010 0737   BUN 19 05/12/2011 1337   BUN 27* 10/05/2010 0737   BUN 27* 10/05/2010 0737   CREATININE 1.3* 05/12/2011 1337   CREATININE 1.11* 10/05/2010 0737   CREATININE 1.11* 10/05/2010 0737      Component Value Date/Time   CALCIUM 9.8 05/12/2011 1337   CALCIUM 10.0 10/05/2010 0737   CALCIUM 10.0 10/05/2010 0737   ALKPHOS 95* 05/12/2011 1337   ALKPHOS 95 10/05/2010 0737   ALKPHOS 95 10/05/2010 0737   AST 40* 05/12/2011 1337   AST 22 10/05/2010 0737   AST 22 10/05/2010 0737   ALT 12 10/05/2010 0737   ALT 12 10/05/2010 0737   BILITOT 0.60 05/12/2011 1337   BILITOT 0.5 10/05/2010 0737   BILITOT 0.5 10/05/2010 0737      STUDIES: Abdominal pelvic CT results as previously described  ASSESSMENT: 78 year old [place] woman   (1) status post optimal debulking of 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  After these problems resolved she received 2 additional cycles of single-agent carboplatin completed in March 2011  (2)  She had her first recurrence February 2012  treated with carboplatin and Gemzar for a total of 6 cycles between February and June 2012.  Her CEA-125 had normalized before the beginning of cycle 5.   PLAN: Her abdominal CT confirms the recurrence and gives is a bit of a measurable disease baseline. Her CA 125 has risen further to 360.8 as of 05/02/2011. She is having symptomatic abdominal distention.  I concur with Dr. Nedra Hai suggestion that we try the carboplatin/lactulose some old doxorubicin every 28 day protocol. Normally we would start next week since we need an echocardiogram of. However Juliany is very eager to get started because she would like to avoid having to undergo a paracentesis.  Accordingly E. we are going to do the carboplatin tomorrow. She will have an echo in the next few days and then she will have her Doxil treatment next week on March 18. We will resume her treatments on April 8 and she will be treated every 28 days thereafter. Of course we will follow the CA 125 on a monthly basis. Before she returns to see Dr. Nedra Hai she will have a repeat CT of the abdomen and pelvis as well as a chest x-ray  Sheriann and her daughter Regina West have a good understanding of the above. All the orders have been written and she received prescriptions for ondansetron and prochlorperazine. She really has the Lidoderm cane cream available and knows how to use it. She knows to call for any problems that may develop before the next   Rigdon Macomber C    05/16/2011

## 2011-05-17 ENCOUNTER — Other Ambulatory Visit: Payer: Self-pay | Admitting: Oncology

## 2011-05-17 ENCOUNTER — Other Ambulatory Visit: Payer: Self-pay | Admitting: *Deleted

## 2011-05-17 ENCOUNTER — Ambulatory Visit (HOSPITAL_BASED_OUTPATIENT_CLINIC_OR_DEPARTMENT_OTHER): Payer: Medicare Other

## 2011-05-17 DIAGNOSIS — C569 Malignant neoplasm of unspecified ovary: Secondary | ICD-10-CM

## 2011-05-17 DIAGNOSIS — Z5111 Encounter for antineoplastic chemotherapy: Secondary | ICD-10-CM

## 2011-05-17 MED ORDER — HEPARIN SOD (PORK) LOCK FLUSH 100 UNIT/ML IV SOLN
500.0000 [IU] | Freq: Once | INTRAVENOUS | Status: AC | PRN
  Administered 2011-05-17: 500 [IU]
  Filled 2011-05-17: qty 5

## 2011-05-17 MED ORDER — DEXAMETHASONE SODIUM PHOSPHATE 4 MG/ML IJ SOLN
20.0000 mg | Freq: Once | INTRAMUSCULAR | Status: AC
  Administered 2011-05-17: 20 mg via INTRAVENOUS

## 2011-05-17 MED ORDER — SODIUM CHLORIDE 0.9 % IV SOLN
250.0000 mg | Freq: Once | INTRAVENOUS | Status: AC
  Administered 2011-05-17: 250 mg via INTRAVENOUS
  Filled 2011-05-17: qty 25

## 2011-05-17 MED ORDER — SODIUM CHLORIDE 0.9 % IJ SOLN
100.0000 ug | Freq: Once | INTRAVENOUS | Status: AC
  Administered 2011-05-17: 0.1 mg via INTRADERMAL
  Filled 2011-05-17: qty 0.01

## 2011-05-17 MED ORDER — ONDANSETRON 16 MG/50ML IVPB (CHCC)
16.0000 mg | Freq: Once | INTRAVENOUS | Status: AC
  Administered 2011-05-17: 16 mg via INTRAVENOUS

## 2011-05-17 MED ORDER — SODIUM CHLORIDE 0.9 % IJ SOLN
10.0000 mL | INTRAMUSCULAR | Status: DC | PRN
  Administered 2011-05-17: 10 mL
  Filled 2011-05-17: qty 10

## 2011-05-18 ENCOUNTER — Ambulatory Visit (HOSPITAL_COMMUNITY)
Admission: RE | Admit: 2011-05-18 | Discharge: 2011-05-18 | Disposition: A | Discharge status: Home / Self Care | Payer: Medicare Other | Source: Ambulatory Visit | Attending: Oncology | Admitting: Oncology

## 2011-05-18 ENCOUNTER — Other Ambulatory Visit: Payer: Self-pay | Admitting: Oncology

## 2011-05-18 DIAGNOSIS — R142 Eructation: Secondary | ICD-10-CM | POA: Insufficient documentation

## 2011-05-18 DIAGNOSIS — R188 Other ascites: Secondary | ICD-10-CM | POA: Insufficient documentation

## 2011-05-18 DIAGNOSIS — C569 Malignant neoplasm of unspecified ovary: Secondary | ICD-10-CM

## 2011-05-18 DIAGNOSIS — R143 Flatulence: Secondary | ICD-10-CM | POA: Insufficient documentation

## 2011-05-18 DIAGNOSIS — R141 Gas pain: Secondary | ICD-10-CM | POA: Insufficient documentation

## 2011-05-20 ENCOUNTER — Ambulatory Visit (HOSPITAL_COMMUNITY)
Admission: RE | Admit: 2011-05-20 | Discharge: 2011-05-20 | Disposition: A | Discharge status: Home / Self Care | Payer: Medicare Other | Source: Ambulatory Visit | Attending: Oncology | Admitting: Oncology

## 2011-05-20 DIAGNOSIS — Z01818 Encounter for other preprocedural examination: Secondary | ICD-10-CM | POA: Insufficient documentation

## 2011-05-20 DIAGNOSIS — I519 Heart disease, unspecified: Secondary | ICD-10-CM

## 2011-05-20 DIAGNOSIS — C569 Malignant neoplasm of unspecified ovary: Secondary | ICD-10-CM | POA: Insufficient documentation

## 2011-05-20 NOTE — Progress Notes (Signed)
  Echocardiogram 2D Echocardiogram has been performed.  Emelia Loron A 05/20/2011, 11:35 AM

## 2011-05-23 ENCOUNTER — Inpatient Hospital Stay (HOSPITAL_COMMUNITY)
Admission: EM | Admit: 2011-05-23 | Discharge: 2011-06-08 | DRG: 389 | Disposition: A | Discharge status: Home Health | Payer: Medicare Other | Attending: Oncology | Admitting: Oncology

## 2011-05-23 ENCOUNTER — Encounter (HOSPITAL_COMMUNITY): Payer: Self-pay | Admitting: Emergency Medicine

## 2011-05-23 ENCOUNTER — Telehealth: Payer: Self-pay | Admitting: *Deleted

## 2011-05-23 ENCOUNTER — Emergency Department (HOSPITAL_COMMUNITY): Payer: Medicare Other

## 2011-05-23 ENCOUNTER — Ambulatory Visit (HOSPITAL_BASED_OUTPATIENT_CLINIC_OR_DEPARTMENT_OTHER): Payer: Medicare Other | Admitting: Family

## 2011-05-23 ENCOUNTER — Other Ambulatory Visit: Payer: Self-pay

## 2011-05-23 ENCOUNTER — Other Ambulatory Visit: Payer: Self-pay | Admitting: *Deleted

## 2011-05-23 ENCOUNTER — Ambulatory Visit (HOSPITAL_BASED_OUTPATIENT_CLINIC_OR_DEPARTMENT_OTHER): Payer: Medicare Other

## 2011-05-23 ENCOUNTER — Other Ambulatory Visit (HOSPITAL_BASED_OUTPATIENT_CLINIC_OR_DEPARTMENT_OTHER): Payer: Medicare Other | Admitting: Lab

## 2011-05-23 VITALS — BP 168/99 | HR 94 | Temp 98.5°F | Ht 66.0 in | Wt 156.0 lb

## 2011-05-23 DIAGNOSIS — G40909 Epilepsy, unspecified, not intractable, without status epilepticus: Secondary | ICD-10-CM | POA: Diagnosis present

## 2011-05-23 DIAGNOSIS — E739 Lactose intolerance, unspecified: Secondary | ICD-10-CM | POA: Diagnosis present

## 2011-05-23 DIAGNOSIS — E876 Hypokalemia: Secondary | ICD-10-CM | POA: Diagnosis present

## 2011-05-23 DIAGNOSIS — E860 Dehydration: Secondary | ICD-10-CM

## 2011-05-23 DIAGNOSIS — E785 Hyperlipidemia, unspecified: Secondary | ICD-10-CM | POA: Diagnosis present

## 2011-05-23 DIAGNOSIS — C569 Malignant neoplasm of unspecified ovary: Secondary | ICD-10-CM

## 2011-05-23 DIAGNOSIS — E871 Hypo-osmolality and hyponatremia: Secondary | ICD-10-CM | POA: Diagnosis present

## 2011-05-23 DIAGNOSIS — K5669 Other intestinal obstruction: Principal | ICD-10-CM | POA: Diagnosis present

## 2011-05-23 DIAGNOSIS — R112 Nausea with vomiting, unspecified: Secondary | ICD-10-CM | POA: Diagnosis present

## 2011-05-23 DIAGNOSIS — I10 Essential (primary) hypertension: Secondary | ICD-10-CM | POA: Diagnosis present

## 2011-05-23 DIAGNOSIS — R7309 Other abnormal glucose: Secondary | ICD-10-CM | POA: Diagnosis present

## 2011-05-23 DIAGNOSIS — J329 Chronic sinusitis, unspecified: Secondary | ICD-10-CM

## 2011-05-23 DIAGNOSIS — E46 Unspecified protein-calorie malnutrition: Secondary | ICD-10-CM | POA: Diagnosis present

## 2011-05-23 DIAGNOSIS — R630 Anorexia: Secondary | ICD-10-CM

## 2011-05-23 DIAGNOSIS — K56609 Unspecified intestinal obstruction, unspecified as to partial versus complete obstruction: Secondary | ICD-10-CM

## 2011-05-23 DIAGNOSIS — E878 Other disorders of electrolyte and fluid balance, not elsewhere classified: Secondary | ICD-10-CM

## 2011-05-23 HISTORY — DX: Epilepsy, unspecified, not intractable, without status epilepticus: G40.909

## 2011-05-23 HISTORY — DX: Hyperlipidemia, unspecified: E78.5

## 2011-05-23 HISTORY — DX: Malignant (primary) neoplasm, unspecified: C80.1

## 2011-05-23 HISTORY — DX: Other abnormal glucose: R73.09

## 2011-05-23 HISTORY — DX: Diaphragmatic hernia without obstruction or gangrene: K44.9

## 2011-05-23 HISTORY — DX: Essential (primary) hypertension: I10

## 2011-05-23 LAB — COMPREHENSIVE METABOLIC PANEL
ALT: 27 U/L (ref 0–35)
AST: 24 U/L (ref 0–37)
AST: 24 U/L (ref 0–37)
Albumin: 3.7 g/dL (ref 3.5–5.2)
Alkaline Phosphatase: 116 U/L (ref 39–117)
Alkaline Phosphatase: 108 U/L (ref 39–117)
BUN: 22 mg/dL (ref 6–23)
Calcium: 9.8 mg/dL (ref 8.4–10.5)
Chloride: 85 mEq/L — ABNORMAL LOW (ref 96–112)
Chloride: 85 mEq/L — ABNORMAL LOW (ref 96–112)
Creatinine, Ser: 1.19 mg/dL — ABNORMAL HIGH (ref 0.50–1.10)
Potassium: 3.8 mEq/L (ref 3.5–5.3)
Potassium: 3.4 mEq/L — ABNORMAL LOW (ref 3.5–5.1)
Total Bilirubin: 0.5 mg/dL (ref 0.3–1.2)

## 2011-05-23 LAB — CBC WITH DIFFERENTIAL/PLATELET
BASO%: 0 % (ref 0.0–2.0)
LYMPH%: 13.2 % — ABNORMAL LOW (ref 14.0–49.7)
MCHC: 35.7 g/dL (ref 31.5–36.0)
MONO#: 0.5 10*3/uL (ref 0.1–0.9)
RBC: 4.08 10*6/uL (ref 3.70–5.45)
WBC: 2.9 10*3/uL — ABNORMAL LOW (ref 3.9–10.3)
lymph#: 0.4 10*3/uL — ABNORMAL LOW (ref 0.9–3.3)

## 2011-05-23 LAB — CA 125: CA 125: 426.8 U/mL — ABNORMAL HIGH (ref 0.0–30.2)

## 2011-05-23 LAB — DIFFERENTIAL
Basophils Absolute: 0 10*3/uL (ref 0.0–0.1)
Basophils Relative: 0 % (ref 0–1)
Lymphocytes Relative: 13 % (ref 12–46)
Monocytes Relative: 19 % — ABNORMAL HIGH (ref 3–12)
Neutro Abs: 1.9 10*3/uL (ref 1.7–7.7)
Neutrophils Relative %: 69 % (ref 43–77)

## 2011-05-23 LAB — CBC
Hemoglobin: 12.3 g/dL (ref 12.0–15.0)
MCHC: 35.1 g/dL (ref 30.0–36.0)
RBC: 3.79 MIL/uL — ABNORMAL LOW (ref 3.87–5.11)
WBC: 2.7 10*3/uL — ABNORMAL LOW (ref 4.0–10.5)

## 2011-05-23 MED ORDER — SODIUM CHLORIDE 0.9 % IV BOLUS (SEPSIS)
500.0000 mL | INTRAVENOUS | Status: AC
  Administered 2011-05-23: 500 mL via INTRAVENOUS

## 2011-05-23 MED ORDER — SODIUM CHLORIDE 0.9 % IV SOLN
INTRAVENOUS | Status: DC
  Administered 2011-05-23 – 2011-05-24 (×2): 100 mL/h via INTRAVENOUS

## 2011-05-23 MED ORDER — MORPHINE SULFATE 4 MG/ML IJ SOLN
4.0000 mg | Freq: Once | INTRAMUSCULAR | Status: AC
  Administered 2011-05-23: 4 mg via INTRAVENOUS
  Filled 2011-05-23: qty 1

## 2011-05-23 MED ORDER — MORPHINE SULFATE 4 MG/ML IJ SOLN
4.0000 mg | Freq: Once | INTRAMUSCULAR | Status: AC
  Administered 2011-05-24: 4 mg via INTRAVENOUS
  Filled 2011-05-23: qty 1

## 2011-05-23 MED ORDER — SODIUM CHLORIDE 0.9 % IV SOLN: Freq: Once | INTRAVENOUS | Status: DC

## 2011-05-23 MED ORDER — ONDANSETRON HCL 4 MG/2ML IJ SOLN
4.0000 mg | INTRAMUSCULAR | Status: DC | PRN
  Administered 2011-05-23 – 2011-05-24 (×3): 4 mg via INTRAVENOUS
  Filled 2011-05-23 (×4): qty 2

## 2011-05-23 MED ORDER — IOHEXOL 300 MG/ML  SOLN
100.0000 mL | Freq: Once | INTRAMUSCULAR | Status: AC | PRN
  Administered 2011-05-23: 100 mL via INTRAVENOUS

## 2011-05-23 MED ORDER — SODIUM CHLORIDE 0.9 % IV SOLN
250.0000 mg | Freq: Two times a day (BID) | INTRAVENOUS | Status: DC
  Administered 2011-05-24 – 2011-06-08 (×32): 250 mg via INTRAVENOUS
  Filled 2011-05-23 (×38): qty 2.5

## 2011-05-23 MED ORDER — ONDANSETRON 8 MG/50ML IVPB (CHCC)
8.0000 mg | Freq: Once | INTRAVENOUS | Status: AC
  Administered 2011-05-23: 8 mg via INTRAVENOUS

## 2011-05-23 NOTE — Telephone Encounter (Signed)
per orders from 05-23-2011 placed a lab for 05-24-2011 starting at 8:00am

## 2011-05-23 NOTE — Patient Instructions (Signed)
Per Dr. Darnelle Catalan- pt to be taken to ED for further evaluation. Pt taken via wheelchair by RN to ED.

## 2011-05-23 NOTE — ED Notes (Signed)
Pt daughter Aleayah [place] phone # cell [phone] #home [phone]

## 2011-05-23 NOTE — ED Notes (Signed)
RN getting labs from pt port-a-cath

## 2011-05-23 NOTE — ED Provider Notes (Signed)
History     CSN: [id number]  Arrival date & time 05/23/11  4132   First MD Initiated Contact with Patient 05/23/11 1846      Chief Complaint  Patient presents with  . Nausea    (Consider location/radiation/quality/duration/timing/severity/associated sxs/prior treatment) HPI  Patient relates she was diagnosed with ovarian cancer 2 or 3 years ago. She relates after chemotherapy she was in remission. She states she found out in February she had a recurrence. She relates she started her first round of chemotherapy one week ago. She has been having nausea and vomiting that started before her chemotherapy but has gotten worse. She denies fever but has had chills. She denies diarrhea. She denies abdominal pain but states she has diffuse discomfort of her abdomen and she feels like she is distended but has started about a week ago. She states she's never had this before. She was seen in Dr. Daneil Dolin knots office this afternoon and given some fluids and sent to the ER for possible admission.  PCP Dr. Lorain Childes Oncologist Dr. Arlice Colt Oncology gynecologist is Dr. Ivar Drape at [facility]  Past Medical History  Diagnosis Date  . Cancer    ovarian cancer Seizure disorder Hypertension Diabetes  Past Surgical History  Procedure Date  . Abdominal surgery    hysterectomy, appendectomy,oopherectomy  No family history on file.  History  Substance Use Topics  . Smoking status: Never Smoker   . Smokeless tobacco: Not on file  . Alcohol Use: No  Lives in independent living at [facility]  OB History    Grav Para Term Preterm Abortions TAB SAB Ect Mult Living                  Review of Systems  All other systems reviewed and are negative.    Allergies  Avastin  Home Medications   Current Outpatient Rx  Name Route Sig Dispense Refill  . VITAMIN D 1000 UNITS PO TABS Oral Take 1,000 Units by mouth 2 (two) times daily.    Marland Kitchen LEVETIRACETAM 500 MG PO TABS Oral Take 250 mg by mouth every 12  (twelve) hours. Pt takes 1/2 tab for 250 mg dose    . LIDOCAINE-PRILOCAINE 2.5-2.5 % EX CREA Topical Apply 1 application topically as needed.    Marland Kitchen ONDANSETRON 8 MG PO TBDP Oral Take 8 mg by mouth every 8 (eight) hours as needed. nausea    . PRESCRIPTION MEDICATION  Pt gets chemo at [facility]. Last treatment was on 05-16-11 followed by Dr Bevelyn Buckles    . PROCHLORPERAZINE MALEATE 10 MG PO TABS Oral Take 1 tablet (10 mg total) by mouth every 6 (six) hours as needed. 30 tablet 0    BP 178/87  Pulse 118  Temp(Src) 97.5 F (36.4 C) (Oral)  Resp 22  SpO2 97%  Vital signs normal except hypertension, tachycardia   Physical Exam  Nursing note and vitals reviewed. Constitutional: She is oriented to person, place, and time. She appears well-developed and well-nourished.  Non-toxic appearance. She does not appear ill. No distress.  HENT:  Head: Normocephalic and atraumatic.  Right Ear: External ear normal.  Left Ear: External ear normal.  Nose: Nose normal. No mucosal edema or rhinorrhea.  Mouth/Throat: Oropharynx is clear and moist and mucous membranes are normal. No dental abscesses or uvula swelling.  Eyes: Conjunctivae and EOM are normal. Pupils are equal, round, and reactive to light.  Neck: Normal range of motion and full passive range of motion without pain. Neck supple.  Cardiovascular: Normal rate, regular rhythm and normal heart sounds.  Exam reveals no gallop and no friction rub.   No murmur heard. Pulmonary/Chest: Effort normal and breath sounds normal. No respiratory distress. She has no wheezes. She has no rhonchi. She has no rales. She exhibits no tenderness and no crepitus.  Abdominal: Soft. Normal appearance and bowel sounds are normal. She exhibits distension. There is no tenderness. There is no rebound and no guarding.       Abdomen distended distended with mild discomfort to palpation. Her exam feels like ascites.  Musculoskeletal: Normal range of motion. She exhibits no edema and no  tenderness.       Moves all extremities well.   Neurological: She is alert and oriented to person, place, and time. She has normal strength. No cranial nerve deficit.  Skin: Skin is warm, dry and intact. No rash noted. No erythema. There is pallor.  Psychiatric: Her speech is normal and behavior is normal. Her mood appears not anxious.       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 She did have intraperitoneal port was removed in April 2011. Her CA 125 level rose in  January. She had any abdominal CT done in February 19 showing peritoneal surface disease. She had a ultrasound done on March 12 that only showed trace ascites and therefore a paracentesis was not done.`She was restarted on Chemotherapy 1 week ago.    Medications  0.9 %  sodium chloride infusion (100 mL/hr Intravenous New Bag/Given 05/23/11 2014)  ondansetron (ZOFRAN) injection 4 mg (4 mg Intravenous Given 05/24/11 0034)  levETIRAcetam (KEPPRA) 250 mg in sodium chloride 0.9 % 100 mL IVPB (not administered)  sodium chloride 0.9 % bolus 500 mL (500 mL Intravenous Given 05/23/11 2013)  morphine 4 MG/ML injection 4 mg (4 mg Intravenous Given 05/23/11 2015)  iohexol (OMNIPAQUE) 300 MG/ML solution 100 mL (100 mL Intravenous Contrast Given 05/23/11 2255)  morphine 4 MG/ML injection 4 mg (4 mg Intravenous Given 05/24/11 0034)   Daughter concerned, states she needs her keppra 250 mg this evening.  [id number] Dr Andrey Campanile, surgery,  place NG, have medicine admit, will consult  0027 Dr Evlyn Kanner, admit to Dr Jacky Kindle to med-surg bed   Results for orders placed during the hospital encounter of 05/23/11  CBC      Component Value Range   WBC 2.7 (*) 4.0 - 10.5 (K/uL)   RBC 3.79 (*) 3.87 - 5.11 (MIL/uL)   Hemoglobin 12.3  12.0 - 15.0 (g/dL)   HCT 11.9 (*) 14.7 - 46.0 (%)   MCV 92.3  78.0 - 100.0  (fL)   MCH 32.5  26.0 - 34.0 (pg)   MCHC 35.1  30.0 - 36.0 (g/dL)   RDW 82.9  56.2 - 13.0 (%)   Platelets 246  150 - 400 (K/uL)  DIFFERENTIAL      Component Value Range   Neutrophils Relative 69  43 - 77 (%)   Neutro Abs 1.9  1.7 - 7.7 (K/uL)   Lymphocytes Relative 13  12 - 46 (%)   Lymphs Abs 0.3 (*) 0.7 - 4.0 (K/uL)   Monocytes Relative 19 (*) 3 - 12 (%)   Monocytes Absolute 0.5  0.1 - 1.0 (K/uL)   Eosinophils Relative 0  0 - 5 (%)   Eosinophils Absolute 0.0  0.0 - 0.7 (K/uL)  Basophils Relative 0  0 - 1 (%)   Basophils Absolute 0.0  0.0 - 0.1 (K/uL)  COMPREHENSIVE METABOLIC PANEL      Component Value Range   Sodium 124 (*) 135 - 145 (mEq/L)   Potassium 3.4 (*) 3.5 - 5.1 (mEq/L)   Chloride 85 (*) 96 - 112 (mEq/L)   CO2 27  19 - 32 (mEq/L)   Glucose, Bld 196 (*) 70 - 99 (mg/dL)   BUN 22  6 - 23 (mg/dL)   Creatinine, Ser 7.82 (*) 0.50 - 1.10 (mg/dL)   Calcium 9.3  8.4 - 95.6 (mg/dL)   Total Protein 6.8  6.0 - 8.3 (g/dL)   Albumin 3.7  3.5 - 5.2 (g/dL)   AST 24  0 - 37 (U/L)   ALT 24  0 - 35 (U/L)   Alkaline Phosphatase 108  39 - 117 (U/L)   Total Bilirubin 0.5  0.3 - 1.2 (mg/dL)   GFR calc non Af Amer 46 (*) >90 (mL/min)   GFR calc Af Amer 53 (*) >90 (mL/min)  LIPASE, BLOOD      Component Value Range   Lipase 35  11 - 59 (U/L)   Laboratory interpretation all normal except hyponatremia, low chloride, consistent with dehydration, hyperglycemia, mildly elevated creatinine.  Ct Abdomen Pelvis W Contrast  05/23/2011  *RADIOLOGY REPORT*  Clinical Data: Evaluate for small bowel obstruction.  CT ABDOMEN AND PELVIS WITH CONTRAST  Technique:  Multidetector CT imaging of the abdomen and pelvis was performed following the standard protocol during bolus administration of intravenous contrast.  Contrast: OMNIPAQUE IOHEXOL 300 MG/ML IJ SOLN  Comparison: 05/02/2011  Findings: There are bilateral pleural effusions, right greater than left.  Atelectasis is noted within both lung bases.   The patient has a very large hiatal hernia.  The stomach and the small bowel loops are unremarkable.  The adrenal glands are normal. Gallbladder appears normal.  There is no biliary dilatation.  There is moderate atrophy of the pancreatic parenchyma.  Normal appearance of both kidneys.  No enlarged upper abdominal lymph nodes.  There is no pelvic or inguinal adenopathy.  The urinary bladder appears normal.  There is a small amount of free fluid within the pelvis.  Marked increased caliber of the proximal small bowel loops are noted measuring up to 4.1 cm.  Transition to decreased caliber small bowel loops is noted within the central portion of the abdomen, image 67.  The terminal ileum is collapsed.  There is a moderate amount of stool identified within the proximal colon.  Extensive sigmoid diverticulosis is noted without acute inflammation.  There is no evidence for bowel perforation.  No abscess formation is identified.  Small umbilical hernia contains fat only.  Review of the visualized osseous structures is significant for mild scoliosis and multilevel spondylosis.  There is a retrolisthesis of L1 on L2 and L2 on L3.  Extensive facet degenerative changes noted.  IMPRESSION:  1.  Examination is positive for high-grade small bowel obstruction. The transition point is proximal to the terminal ileum within the central abdomen, image 67 of the axial series. 2.  Very large hiatal hernia.  3.  Bilateral pleural effusions, right greater than left.  Original Report Authenticated By: Rosealee Albee, M.D.   Dg Abd Acute W/chest  05/23/2011  *RADIOLOGY REPORT*  Clinical Data: Pain and weakness.  ACUTE ABDOMEN SERIES (ABDOMEN 2 VIEW & CHEST 1 VIEW)  Comparison: None.  Findings: Right greater than left pleural effusions.  No infiltrates  or nodules.  Cardiomegaly.  Calcified tortuous aorta. Large fluid-filled hiatal hernia.  Dilated small bowel loops with air-fluid levels suggesting partial obstruction.  Moderate stool  burden.  Degenerative change lumbar spine and hips. Small bowel obstruction was not present on previous CT.  IMPRESSION: Partial small bowel obstruction is suspected.  Right greater than left pleural effusions with large hiatal hernia.  Original Report Authenticated By: Elsie Stain, M.D.     Date: 05/23/2011  Rate: 96  Rhythm: normal sinus rhythm  QRS Axis: normal  Intervals: normal  ST/T Wave abnormalities: nonspecific T wave changes  Conduction Disutrbances:none  Narrative Interpretation:   Old EKG Reviewed: none available     1. Small bowel obstruction   2. Ovarian cancer   3. Nausea and vomiting   4. Dehydration   5. Hyponatremia   6. Serum chloride decreased     Plan admission  Devoria Albe, MD, FACEP   MDM          Ward Givens, MD 05/24/11 [phone]

## 2011-05-23 NOTE — ED Notes (Signed)
Pts R chest power port accessed this am per oncology with single lumen line.

## 2011-05-23 NOTE — Progress Notes (Signed)
Pt vomit x3 bile colored emesis. Notified MD who evaluated pt in chemo room. Per MD pt to be taken to ED for further evaluation.  Fluids stopped.PAC Capped. Pt transported via wheelchair to ED by Diane,RN.

## 2011-05-23 NOTE — ED Notes (Signed)
[id number]<AV> Expected date:05/23/11<BR> Expected time: 5:56 PM<BR> Means of arrival:<BR> Comments:<BR> Terminal clean

## 2011-05-23 NOTE — ED Notes (Signed)
Pt c/o nausea past 15 days with abdominal distension. Pt had 1 litre fluids IV at 6pm at Dr Princella Pellegrini office prior to ER arrival. Pt abd grossly distended.

## 2011-05-23 NOTE — Telephone Encounter (Signed)
per nurse myers request add patient a lab appointment for 05-24-2011 at 8:00am patient has been notified

## 2011-05-24 ENCOUNTER — Ambulatory Visit: Payer: Medicare Other

## 2011-05-24 ENCOUNTER — Emergency Department (HOSPITAL_COMMUNITY): Payer: Medicare Other

## 2011-05-24 ENCOUNTER — Other Ambulatory Visit: Payer: Medicare Other | Admitting: Lab

## 2011-05-24 ENCOUNTER — Encounter (HOSPITAL_COMMUNITY): Payer: Self-pay

## 2011-05-24 DIAGNOSIS — C569 Malignant neoplasm of unspecified ovary: Secondary | ICD-10-CM

## 2011-05-24 DIAGNOSIS — E871 Hypo-osmolality and hyponatremia: Secondary | ICD-10-CM

## 2011-05-24 DIAGNOSIS — K56609 Unspecified intestinal obstruction, unspecified as to partial versus complete obstruction: Secondary | ICD-10-CM

## 2011-05-24 DIAGNOSIS — R112 Nausea with vomiting, unspecified: Secondary | ICD-10-CM

## 2011-05-24 DIAGNOSIS — E860 Dehydration: Secondary | ICD-10-CM

## 2011-05-24 DIAGNOSIS — G40909 Epilepsy, unspecified, not intractable, without status epilepticus: Secondary | ICD-10-CM

## 2011-05-24 DIAGNOSIS — R109 Unspecified abdominal pain: Secondary | ICD-10-CM

## 2011-05-24 DIAGNOSIS — D649 Anemia, unspecified: Secondary | ICD-10-CM

## 2011-05-24 LAB — CBC
HCT: 31.7 % — ABNORMAL LOW (ref 36.0–46.0)
Hemoglobin: 11.2 g/dL — ABNORMAL LOW (ref 12.0–15.0)
RBC: 3.42 MIL/uL — ABNORMAL LOW (ref 3.87–5.11)
RDW: 11.7 % (ref 11.5–15.5)
WBC: 2.7 10*3/uL — ABNORMAL LOW (ref 4.0–10.5)

## 2011-05-24 LAB — BASIC METABOLIC PANEL
CO2: 25 mEq/L (ref 19–32)
Calcium: 8.5 mg/dL (ref 8.4–10.5)
Chloride: 92 mEq/L — ABNORMAL LOW (ref 96–112)
Potassium: 3.2 mEq/L — ABNORMAL LOW (ref 3.5–5.1)
Sodium: 129 mEq/L — ABNORMAL LOW (ref 135–145)

## 2011-05-24 LAB — DIFFERENTIAL
Basophils Absolute: 0 10*3/uL (ref 0.0–0.1)
Lymphocytes Relative: 22 % (ref 12–46)
Monocytes Absolute: 0.2 10*3/uL (ref 0.1–1.0)
Neutro Abs: 1.9 10*3/uL (ref 1.7–7.7)

## 2011-05-24 LAB — MAGNESIUM: Magnesium: 1.3 mg/dL — ABNORMAL LOW (ref 1.5–2.5)

## 2011-05-24 MED ORDER — SODIUM CHLORIDE 0.9 % IV SOLN: INTRAVENOUS | Status: DC

## 2011-05-24 MED ORDER — ONDANSETRON HCL 4 MG/2ML IJ SOLN: 4.0000 mg | INTRAMUSCULAR | Status: DC | PRN

## 2011-05-24 MED ORDER — POTASSIUM CHLORIDE 10 MEQ/100ML IV SOLN
10.0000 meq | INTRAVENOUS | Status: AC
  Administered 2011-05-24: 10 meq via INTRAVENOUS
  Filled 2011-05-24: qty 100

## 2011-05-24 MED ORDER — MORPHINE SULFATE 4 MG/ML IJ SOLN
4.0000 mg | INTRAMUSCULAR | Status: DC | PRN
  Administered 2011-05-24: 4 mg via INTRAVENOUS
  Filled 2011-05-24: qty 1

## 2011-05-24 MED ORDER — HYDROMORPHONE HCL PF 1 MG/ML IJ SOLN
0.5000 mg | INTRAMUSCULAR | Status: DC | PRN
  Administered 2011-05-24 – 2011-05-26 (×5): 0.5 mg via INTRAVENOUS
  Filled 2011-05-24 (×5): qty 1

## 2011-05-24 MED ORDER — LIDOCAINE HCL 2 % EX GEL
Freq: Once | CUTANEOUS | Status: AC
  Administered 2011-05-24: 02:00:00 via TOPICAL
  Filled 2011-05-24 (×2): qty 10

## 2011-05-24 MED ORDER — POTASSIUM CHLORIDE IN NACL 20-0.9 MEQ/L-% IV SOLN
INTRAVENOUS | Status: AC
  Administered 2011-05-25: 05:00:00 via INTRAVENOUS
  Filled 2011-05-24 (×7): qty 1000

## 2011-05-24 MED ORDER — PANTOPRAZOLE SODIUM 40 MG IV SOLR
40.0000 mg | INTRAVENOUS | Status: DC
  Administered 2011-05-24 – 2011-06-08 (×16): 40 mg via INTRAVENOUS
  Filled 2011-05-24 (×18): qty 40

## 2011-05-24 MED ORDER — ONDANSETRON HCL 4 MG/2ML IJ SOLN
4.0000 mg | Freq: Four times a day (QID) | INTRAMUSCULAR | Status: DC | PRN
  Administered 2011-05-24: 4 mg via INTRAVENOUS

## 2011-05-24 NOTE — H&P (Signed)
PCP:   Minda Meo, MD, MD   Chief Complaint:  Nausea and vomiting  HPI: Regina West is a 78 year old patient well-known to me for multiple years with a diagnosis of ovarian cancer for approximately 3 years and he had debulking surgery and multiple rounds of chemotherapy now presenting with nausea and vomiting. Her course was fairly complicated but initially included debulking surgery up at [facility] and multiple rounds of chemotherapy. Approximately one to 2 years ago she had a fairly severe reaction to Avastin   resulting in the toxic metabolic encephalopathic picture and seizure disorders regards to which she read covered very nicely with some residual memory deficit. She has been doing well but evidently has guarded another round of chemotherapy the details of which are not clear guided by Dr. Daneil Dolin not presumably because of rising CEA 125 levels. She relates several days of nausea and vomiting but is unable to pin the exact timing down but does relate that she has mentioned this to the oncologist. Regardless, in the last 24 hours she has had increasing abdominal pain, progressive nausea and vomiting, intolerance to oral intake. She's not had a bowel movement at least 2-3 days. She's had no chest pain or shortness of breath. She's had no fever chills or sweats. Neurologically she has been at baseline. In the emergency room she is found to have a high-grade small bowel obstruction as noted below and she is admitted for decompression, bowel rest, IV fluids and ultimately surgical consultation.  Review of Systems:  The patient denies anorexia, fever, weight loss,, vision loss, decreased hearing, hoarseness, chest pain, syncope, dyspnea on exertion, peripheral edema, balance deficits, hemoptysis, abdominal pain, melena, hematochezia, severe indigestion/heartburn, hematuria, incontinence, genital sores, muscle weakness, suspicious skin lesions, transient blindness, difficulty walking, depression,  unusual weight change, abnormal bleeding, enlarged lymph nodes, angioedema, and breast masses.  Past Medical History: Past Medical History  Diagnosis Date  . Cancer ovarian  Seizure disorder  Empiric glucose tolerance     Past Surgical History  Procedure Date  . Abdominal surgery     Medications: Prior to Admission medications   Medication Sig Start Date End Date Taking? Authorizing Provider  cholecalciferol (VITAMIN D) 1000 UNITS tablet Take 1,000 Units by mouth 2 (two) times daily.   Yes Historical Provider, MD  levETIRAcetam (KEPPRA) 500 MG tablet Take 250 mg by mouth every 12 (twelve) hours. Pt takes 1/2 tab for 250 mg dose   Yes Historical Provider, MD  lidocaine-prilocaine (EMLA) cream Apply 1 application topically as needed.   Yes Historical Provider, MD  ondansetron (ZOFRAN-ODT) 8 MG disintegrating tablet Take 8 mg by mouth every 8 (eight) hours as needed. nausea   Yes Historical Provider, MD  PRESCRIPTION MEDICATION Pt gets chemo at [facility]. Last treatment was on 05-16-11 followed by Dr Bevelyn Buckles   Yes Historical Provider, MD  prochlorperazine (COMPAZINE) 10 MG tablet Take 1 tablet (10 mg total) by mouth every 6 (six) hours as needed. 05/16/11 05/23/11 Yes Lowella Dell, MD    Allergies:   Allergies  Allergen Reactions  . Avastin (Bevacizumab) Other (See Comments)    Swelling of the brain     Social History:  reports that she has never smoked. She does not have any smokeless tobacco history on file. She reports that she does not drink alcohol. Her drug history not on file.  Family History: No family history on file.  Physical Exam: Filed Vitals:   05/23/11 2342 05/24/11 0236 05/24/11 0400 05/24/11 0420  BP: 148/84  154/88 123/71 123/71  Pulse: 99 103 99 91  Temp: 98.3 F (36.8 C) 98.5 F (36.9 C) 98.7 F (37.1 C)   TempSrc: Oral Oral Oral   Resp: 20 18 16 13   SpO2: 97% 94% 94% 93%   General appearance: alert, cooperative and no distress Head: Normocephalic,  without obvious abnormality, atraumatic Eyes: conjunctivae/corneas clear. PERRL, EOM's intact.  Nose: Nares normal. Septum midline. Mucosa normal. No drainage or sinus tenderness. Throat: lips, mucosa, and tongue normal; teeth and gums normal Neck: no adenopathy, no carotid bruit, no JVD and thyroid not enlarged, symmetric, no tenderness/mass/nodules Resp: clear to auscultation bilaterally, diminished breath sounds at the bases Cardio: regular rate and rhythm, S1, S2 normal, no murmur, click, rub or gallop, Port-A-Cath left chest GI: Distended, no bowel sounds, tympanic, soft, no rebound or guarding Neurologically she is awake alert recognizes myself immediately, nonlateralizing oriented x4 Extremities: extremities normal, atraumatic, no cyanosis or edema Pulses: 2+ and symmetric Lymph nodes: Cervical adenopathy: no cervical lymphadenopathy Neurologic: Alert and oriented X 3, normal strength and tone. Normal symmetric reflexes.     Labs on Admission:   [facility] 05/23/11 2017 05/23/11 1423  NA 124* 126*  K 3.4* 3.8  CL 85* 85*  CO2 27 29  GLUCOSE 196* 184*  BUN 22 21  CREATININE 1.11* 1.19*  CALCIUM 9.3 9.8  MG -- --  PHOS -- --    Basename 05/23/11 2017 05/23/11 1423  AST 24 24  ALT 24 27  ALKPHOS 108 116  BILITOT 0.5 0.6  PROT 6.8 7.1  ALBUMIN 3.7 3.9    Basename 05/23/11 2017  LIPASE 35  AMYLASE --    Basename 05/23/11 2017 05/23/11 1424  WBC 2.7* 2.9*  NEUTROABS 1.9 2.0  HGB 12.3 13.2  HCT 35.0* 37.0  MCV 92.3 90.7  PLT 246 226   No results found for this basename: CKTOTAL:3,CKMB:3,CKMBINDEX:3,TROPONINI:3 in the last 72 hours No results found for this basename: TSH,T4TOTAL,FREET3,T3FREE,THYROIDAB in the last 72 hours No results found for this basename: VITAMINB12:2,FOLATE:2,FERRITIN:2,TIBC:2,IRON:2,RETICCTPCT:2 in the last 72 hours  Radiological Exams on Admission: Dg Chest 2 View  05/02/2011  *RADIOLOGY REPORT*  Clinical Data: Ovarian carcinoma  CHEST -  2 VIEW  Comparison: 06/01/2009  Findings: Right IJ port catheter has been placed to the SVC/RA junction.  No pneumothorax.  There is a fluid level in a moderate hiatal hernia.  Lungs are clear.     Degenerative changes in the lower thoracic spine.  There is blunting of the posterior right costophrenic angle suggesting tiny effusion.  IMPRESSION:  1.  Suspect small right pleural effusion. 2.  Moderate hiatal hernia.  Original Report Authenticated By: Osa Craver, M.D.   Ct Abdomen Pelvis W Contrast  05/23/2011  *RADIOLOGY REPORT*  Clinical Data: Evaluate for small bowel obstruction.  CT ABDOMEN AND PELVIS WITH CONTRAST  Technique:  Multidetector CT imaging of the abdomen and pelvis was performed following the standard protocol during bolus administration of intravenous contrast.  Contrast: OMNIPAQUE IOHEXOL 300 MG/ML IJ SOLN  Comparison: 05/02/2011  Findings: There are bilateral pleural effusions, right greater than left.  Atelectasis is noted within both lung bases.  The patient has a very large hiatal hernia.  The stomach and the small bowel loops are unremarkable.  The adrenal glands are normal. Gallbladder appears normal.  There is no biliary dilatation.  There is moderate atrophy of the pancreatic parenchyma.  Normal appearance of both kidneys.  No enlarged upper abdominal lymph nodes.  There is  no pelvic or inguinal adenopathy.  The urinary bladder appears normal.  There is a small amount of free fluid within the pelvis.  Marked increased caliber of the proximal small bowel loops are noted measuring up to 4.1 cm.  Transition to decreased caliber small bowel loops is noted within the central portion of the abdomen, image 67.  The terminal ileum is collapsed.  There is a moderate amount of stool identified within the proximal colon.  Extensive sigmoid diverticulosis is noted without acute inflammation.  There is no evidence for bowel perforation.  No abscess formation is identified.  Small  umbilical hernia contains fat only.  Review of the visualized osseous structures is significant for mild scoliosis and multilevel spondylosis.  There is a retrolisthesis of L1 on L2 and L2 on L3.  Extensive facet degenerative changes noted.  IMPRESSION:  1.  Examination is positive for high-grade small bowel obstruction. The transition point is proximal to the terminal ileum within the central abdomen, image 67 of the axial series. 2.  Very large hiatal hernia.  3.  Bilateral pleural effusions, right greater than left.  Original Report Authenticated By: Rosealee Albee, M.D.   Ct Abdomen Pelvis W Contrast  05/02/2011  *RADIOLOGY REPORT*  Clinical Data: Restaging ovarian cancer.  CT ABDOMEN AND PELVIS WITHOUT AND WITH CONTRAST  Technique:  Multidetector CT imaging of the abdomen and pelvis was performed without contrast material in one or both body regions, followed by contrast material(s) and further sections in one or both body regions.  Contrast: OMNIPAQUE IOHEXOL 300 MG/ML IV SOLN  Comparison: None  Findings: The lung bases demonstrate a large hiatal hernia and a small right effusion.  Minimal bibasilar atelectasis.  No pulmonary nodules.  Small lymph nodes are noted along the major fissure on the right side.  Marked irregularity along the posterior surface of the right hepatic lobe is worrisome for peritoneal surface disease although it could reflect treated disease.  There is a subcapsular fluid collection involving the right hepatic lobe and inferiorly there appear to be calcifications.  Correlation with any prior studies would be very helpful to assess for any change/active disease.  The gallbladder is normal.  No common bile duct dilatation.  The pancreas is unremarkable.  The spleen is normal in size.  No focal lesions or evidence of peritoneal surface disease.  The adrenal glands appear normal.  The kidneys demonstrate prominent extrarenal pelves  The stomach, duodenum, small bowel and colon are  grossly normal. There is a vague nodularity and small mesenteric lymph nodes are indeterminate and again could reflect treated disease.  Comparison with prior studies would be very helpful if available.  There is a fluid collection in the right pericolic gutter adjacent to the colon and coming down into the right pelvis.  There is also mild soft tissue thickening in the right pericolic gutter which could be peritoneal implant.  Moderate diverticulosis of the sigmoid colon is noted but no findings for acute diverticulitis.  The bladder is decompressed. No obvious abnormality.  No pelvic lymphadenopathy.  The uterus is surgically absent.  No inguinal mass or hernia.  The bony structures are unremarkable.  Moderate to advanced degenerative changes noted in the spine.  IMPRESSION:  1.  Small right pleural effusion and large hiatal hernia. 2.  Small intrapulmonary lymph nodes noted along the right major fissure. 3.  Findings suspicious for peritoneal surface disease involving the liver but this could reflect treated disease.  There are also small scattered  mesenteric and peritoneal implants in right paracolic gutter which could reflect active or treated disease. Correlation any prior studies would be very helpful to assess change.  Original Report Authenticated By: P. Loralie Champagne, M.D.   US Abdomen Limited  05/18/2011  Clinical data:  Ovarian carcinoma;   abdominal distention; request is made for therapeutic paracentesis.  LIMITED ULTRASOUND ABDOMEN   05/18/2011  Technique:  Limited ultrasound of the abdomen and pelvis in all four quadrants today revealed only trace amount of ascites. Paracentesis was not performed at this time.  Dr. Darnelle Catalan  was notified of the above findings.  Impression:  Trace amount of ascites.  Paracentesis was not performed at this time.  Read by: Jeananne Rama, P.A.-C  Original Report Authenticated By: Judie Petit. Ruel Favors, M.D.   Dg Abd Acute W/chest  05/23/2011  *RADIOLOGY REPORT*  Clinical  Data: Pain and weakness.  ACUTE ABDOMEN SERIES (ABDOMEN 2 VIEW & CHEST 1 VIEW)  Comparison: None.  Findings: Right greater than left pleural effusions.  No infiltrates or nodules.  Cardiomegaly.  Calcified tortuous aorta. Large fluid-filled hiatal hernia.  Dilated small bowel loops with air-fluid levels suggesting partial obstruction.  Moderate stool burden.  Degenerative change lumbar spine and hips. Small bowel obstruction was not present on previous CT.  IMPRESSION: Partial small bowel obstruction is suspected.  Right greater than left pleural effusions with large hiatal hernia.  Original Report Authenticated By: Elsie Stain, M.D.   Orders placed in visit on 05/23/11  . EKG 12-LEAD    Assessment/Plan   #1 high-grade small bowel obstruction nontoxic was cut off sign at the terminal ileum will require bowel rest, IV fluids and close surgical follow. Evidently surgery was consulted by the emergency room they declined evaluation/admission  #2 ovarian cancer status post debulking, multiple rounds of chemotherapy with recent recurrence currently back on chemotherapy not neutropenic at this time  #3 hyponatremia likely secondary to volume and vomiting  #4 remote impaired glucose tolerance is not a clinical issue in recent times  #5 seizure disorder maintained on Keppra  Plan ask interventional radiology place the NG tube as the emergency room was unable, bowel rest, IV fluids, replacement of electrolytes. Given the nature the CT scan the potential for surgical intervention seems high.   Brindle Leyba A 05/24/2011, 7:09 AM

## 2011-05-24 NOTE — ED Notes (Signed)
Attempt x 1 to place NGT left nare with #16 Fr unsuccessful-curling in the back of pt's throat-obstruction met-attempt x 1 right nare with #14 Fr unsuccessful-again curling in the back of pt's throat-Primary RN to notify admitting physician-pt tolerated with minor discomfort-used lidocaine jelly prior to insertion both nares

## 2011-05-24 NOTE — ED Notes (Signed)
Pt is currently in radiology getting NG placed under fluoro

## 2011-05-24 NOTE — ED Notes (Signed)
Received a call from radiology.  Pt's NG tube is sitting at her hiatal hernia.  Radiologist was afraid to push it further however NG tube is draining and is functional.

## 2011-05-24 NOTE — Consult Note (Signed)
Delayed note entry. Pt seen earlier this am.   Reason for Consult:psbo Referring Physician: Dr Lunette Stands is an 78 y.o. female.  HPI: 78 yo WF with h/o recurrent ovarian cancer treated with omentectomy, debulking, TAH/BSO and ?intraperitoneal chemotherapy at [facility] followed by adjuvant chemotherapy comes into the ED complaining of several week h/o of progressive abdominal pain. She describes the pain as dull and constant. She really can't pinpoint the exact start date of her symptoms. She states the pain is worse with eating. She feels bloated, has nausea, and has been vomiting. She states her last BM was 4-5 days ago. She unsure when she last had flatus. Normally, her BMs are regular.  Her Ca-125 had been relatively low until recently when it dramatically increased in the past 1.5 months. She reports just has had some recent chemo for this recurrence. She denies weight loss, melena, hematochezia, hematemesis, chest pain, SOB, dysuria. She states she really hasn't had anything like this before.   Past Medical History  Diagnosis Date  . Cancer     ovarian, peritoneal serous adenocarcin  . Hypertension   . Hyperlipidemia   . Seizure disorder   . Hiatal hernia   . Increased glucose level     Past Surgical History  Procedure Date  . Abdominal surgery   . Power port placement   . Abdominal hysterectomy     omentectomy, b/l SPO. at [facility]  . Appendectomy   . Cataract extraction, bilateral     History reviewed. No pertinent family history.  Social History:  reports that she quit smoking about 52 years ago. She has never used smokeless tobacco. She reports that she does not drink alcohol or use illicit drugs.  Allergies:  Allergies  Allergen Reactions  . Avastin (Bevacizumab) Other (See Comments)    Swelling of the brain     Medications: I have reviewed the patient's current medications.  Results for orders placed during the hospital encounter of 05/23/11 (from the past 48  hour(s))  CBC     Status: Abnormal   Collection Time   05/23/11  8:17 PM      Component Value Range Comment   WBC 2.7 (*) 4.0 - 10.5 (K/uL)    RBC 3.79 (*) 3.87 - 5.11 (MIL/uL)    Hemoglobin 12.3  12.0 - 15.0 (g/dL)    HCT 16.1 (*) 09.6 - 46.0 (%)    MCV 92.3  78.0 - 100.0 (fL)    MCH 32.5  26.0 - 34.0 (pg)    MCHC 35.1  30.0 - 36.0 (g/dL)    RDW 04.5  40.9 - 81.1 (%)    Platelets 246  150 - 400 (K/uL)   DIFFERENTIAL     Status: Abnormal   Collection Time   05/23/11  8:17 PM      Component Value Range Comment   Neutrophils Relative 69  43 - 77 (%)    Neutro Abs 1.9  1.7 - 7.7 (K/uL)    Lymphocytes Relative 13  12 - 46 (%)    Lymphs Abs 0.3 (*) 0.7 - 4.0 (K/uL)    Monocytes Relative 19 (*) 3 - 12 (%)    Monocytes Absolute 0.5  0.1 - 1.0 (K/uL)    Eosinophils Relative 0  0 - 5 (%)    Eosinophils Absolute 0.0  0.0 - 0.7 (K/uL)    Basophils Relative 0  0 - 1 (%)    Basophils Absolute 0.0  0.0 - 0.1 (K/uL)  COMPREHENSIVE METABOLIC PANEL     Status: Abnormal   Collection Time   05/23/11  8:17 PM      Component Value Range Comment   Sodium 124 (*) 135 - 145 (mEq/L)    Potassium 3.4 (*) 3.5 - 5.1 (mEq/L)    Chloride 85 (*) 96 - 112 (mEq/L)    CO2 27  19 - 32 (mEq/L)    Glucose, Bld 196 (*) 70 - 99 (mg/dL)    BUN 22  6 - 23 (mg/dL)    Creatinine, Ser 1.61 (*) 0.50 - 1.10 (mg/dL)    Calcium 9.3  8.4 - 10.5 (mg/dL)    Total Protein 6.8  6.0 - 8.3 (g/dL)    Albumin 3.7  3.5 - 5.2 (g/dL)    AST 24  0 - 37 (U/L)    ALT 24  0 - 35 (U/L)    Alkaline Phosphatase 108  39 - 117 (U/L)    Total Bilirubin 0.5  0.3 - 1.2 (mg/dL)    GFR calc non Af Amer 46 (*) >90 (mL/min)    GFR calc Af Amer 53 (*) >90 (mL/min)   LIPASE, BLOOD     Status: Normal   Collection Time   05/23/11  8:17 PM      Component Value Range Comment   Lipase 35  11 - 59 (U/L)   BASIC METABOLIC PANEL     Status: Abnormal   Collection Time   05/24/11 10:39 AM      Component Value Range Comment   Sodium 129 (*) 135 - 145  (mEq/L)    Potassium 3.2 (*) 3.5 - 5.1 (mEq/L)    Chloride 92 (*) 96 - 112 (mEq/L)    CO2 25  19 - 32 (mEq/L)    Glucose, Bld 160 (*) 70 - 99 (mg/dL)    BUN 20  6 - 23 (mg/dL)    Creatinine, Ser 0.96  0.50 - 1.10 (mg/dL)    Calcium 8.5  8.4 - 10.5 (mg/dL)    GFR calc non Af Amer 50 (*) >90 (mL/min)    GFR calc Af Amer 58 (*) >90 (mL/min)   MAGNESIUM     Status: Abnormal   Collection Time   05/24/11 10:39 AM      Component Value Range Comment   Magnesium 1.3 (*) 1.5 - 2.5 (mg/dL)   CBC     Status: Abnormal   Collection Time   05/24/11 10:39 AM      Component Value Range Comment   WBC 2.7 (*) 4.0 - 10.5 (K/uL)    RBC 3.42 (*) 3.87 - 5.11 (MIL/uL)    Hemoglobin 11.2 (*) 12.0 - 15.0 (g/dL)    HCT 04.5 (*) 40.9 - 46.0 (%)    MCV 92.7  78.0 - 100.0 (fL)    MCH 32.7  26.0 - 34.0 (pg)    MCHC 35.3  30.0 - 36.0 (g/dL)    RDW 81.1  91.4 - 78.2 (%)    Platelets 202  150 - 400 (K/uL)   DIFFERENTIAL     Status: Abnormal   Collection Time   05/24/11 10:39 AM      Component Value Range Comment   Neutrophils Relative 71  43 - 77 (%)    Neutro Abs 1.9  1.7 - 7.7 (K/uL)    Lymphocytes Relative 22  12 - 46 (%)    Lymphs Abs 0.6 (*) 0.7 - 4.0 (K/uL)    Monocytes Relative 7  3 - 12 (%)  Monocytes Absolute 0.2  0.1 - 1.0 (K/uL)    Eosinophils Relative 0  0 - 5 (%)    Eosinophils Absolute 0.0  0.0 - 0.7 (K/uL)    Basophils Relative 0  0 - 1 (%)    Basophils Absolute 0.0  0.0 - 0.1 (K/uL)     Ct Abdomen Pelvis W Contrast  05/23/2011  *RADIOLOGY REPORT*  Clinical Data: Evaluate for small bowel obstruction.  CT ABDOMEN AND PELVIS WITH CONTRAST  Technique:  Multidetector CT imaging of the abdomen and pelvis was performed following the standard protocol during bolus administration of intravenous contrast.  Contrast: OMNIPAQUE IOHEXOL 300 MG/ML IJ SOLN  Comparison: 05/02/2011  Findings: There are bilateral pleural effusions, right greater than left.  Atelectasis is noted within both lung bases.   The patient has a very large hiatal hernia.  The stomach and the small bowel loops are unremarkable.  The adrenal glands are normal. Gallbladder appears normal.  There is no biliary dilatation.  There is moderate atrophy of the pancreatic parenchyma.  Normal appearance of both kidneys.  No enlarged upper abdominal lymph nodes.  There is no pelvic or inguinal adenopathy.  The urinary bladder appears normal.  There is a small amount of free fluid within the pelvis.  Marked increased caliber of the proximal small bowel loops are noted measuring up to 4.1 cm.  Transition to decreased caliber small bowel loops is noted within the central portion of the abdomen, image 67.  The terminal ileum is collapsed.  There is a moderate amount of stool identified within the proximal colon.  Extensive sigmoid diverticulosis is noted without acute inflammation.  There is no evidence for bowel perforation.  No abscess formation is identified.  Small umbilical hernia contains fat only.  Review of the visualized osseous structures is significant for mild scoliosis and multilevel spondylosis.  There is a retrolisthesis of L1 on L2 and L2 on L3.  Extensive facet degenerative changes noted.  IMPRESSION:  1.  Examination is positive for high-grade small bowel obstruction. The transition point is proximal to the terminal ileum within the central abdomen, image 67 of the axial series. 2.  Very large hiatal hernia.  3.  Bilateral pleural effusions, right greater than left.  Original Report Authenticated By: Rosealee Albee, M.D.   Dg Abd 2 Views  05/24/2011  *RADIOLOGY REPORT*  Clinical Data: Abdominal distention.  Nasogastric tube placed.  ABDOMEN - 2 VIEW  Comparison: CT abdomen pelvis and acute abdominal series 05/23/2011 .  Findings: The lung bases and upper aspect of the abdomen are not included on these images.  Distal aspect of the nasogastric tube projects over the expected location of the proximal stomach.  Right side up decubitus  view is submitted.  Evaluation for free air is limited by overexposure of the far lateral right abdomen.  No gross evidence of free air is seen.  There are air-fluid levels and multiple dilated loops of small bowel.  Small bowel dilatation measures up to least 4.5 cm.  There is stool and a small amount of gas visualized in the proximal colon.  Oral contrast material is seen in the visualized portion of the upper urinary bladder (inferior pelvis not completely included on the image). This contrast is from CT abdomen pelvis performed yesterday.  IMPRESSION:  1.  Persistent high-grade small bowel obstruction pattern. 2.  No gross evidence of free intraperitoneal air. 3.  Nasogastric tube terminates in the proximal stomach (the stomach is not completely included  on this image.  Original Report Authenticated By: Britta Mccreedy, M.D.   Dg Abd Acute W/chest  05/23/2011  *RADIOLOGY REPORT*  Clinical Data: Pain and weakness.  ACUTE ABDOMEN SERIES (ABDOMEN 2 VIEW & CHEST 1 VIEW)  Comparison: None.  Findings: Right greater than left pleural effusions.  No infiltrates or nodules.  Cardiomegaly.  Calcified tortuous aorta. Large fluid-filled hiatal hernia.  Dilated small bowel loops with air-fluid levels suggesting partial obstruction.  Moderate stool burden.  Degenerative change lumbar spine and hips. Small bowel obstruction was not present on previous CT.  IMPRESSION: Partial small bowel obstruction is suspected.  Right greater than left pleural effusions with large hiatal hernia.  Original Report Authenticated By: Elsie Stain, M.D.   Dg Naso G Tube Plc Lanell Persons Blima Rich  05/24/2011  Fluoroscopically guided nasogastric tube placement.  Clinical history:  Small bowel obstruction.  Hiatal hernia.  Fluoroscopic time:  2.26 minutes.  Procedure:  After failed attempt at placing nasogastric tube without fluoroscopic guidance, fluoroscopically guided placement was requested.  Nasogastric tube was placed into the hiatal hernia and  became curled within such.  This was not able to be advanced. As there was drainage of green bilious material, nasogastric tube was left in this position.  Impression:  Nasogastric tube curled within the hiatal hernia.  Original Report Authenticated By: Fuller Canada, M.D.    Review of Systems  Constitutional: Negative for fever and chills.  HENT: Negative for hearing loss and neck pain.   Eyes: Negative for double vision.  Respiratory: Negative for shortness of breath.   Cardiovascular: Negative for chest pain, palpitations, leg swelling and PND.  Gastrointestinal:       See hpi  Genitourinary: Negative for dysuria and hematuria.  Skin: Negative for itching and rash.  Neurological: Positive for weakness. Negative for tremors, seizures, loss of consciousness and headaches.  Psychiatric/Behavioral: Negative for substance abuse.   Blood pressure 135/70, pulse 93, temperature 98.7 F (37.1 C), temperature source Oral, resp. rate 18, SpO2 94.00%. Physical Exam  Vitals reviewed. Constitutional: She is oriented to person, place, and time. She appears well-developed and well-nourished. She is cooperative. She does not have a sickly appearance. No distress.  HENT:  Head: Normocephalic and atraumatic.  Right Ear: External ear normal.  Left Ear: External ear normal.  Eyes: Conjunctivae are normal. No scleral icterus.  Neck: Normal range of motion. Neck supple. No tracheal deviation present. No thyromegaly present.  Cardiovascular: Normal rate, regular rhythm and normal heart sounds.   Respiratory: Effort normal and breath sounds normal. No respiratory distress. She has no wheezes.  GI: Soft. Bowel sounds are normal. She exhibits distension. There is tenderness (mild TTP on left). There is no rigidity, no rebound and no guarding.    Musculoskeletal: Normal range of motion. She exhibits no edema and no tenderness.  Lymphadenopathy:    She has no cervical adenopathy.  Neurological: She is  alert and oriented to person, place, and time. She exhibits normal muscle tone.  Skin: Skin is warm and dry. No rash noted. She is not diaphoretic. No erythema.  Psychiatric: She has a normal mood and affect. Her behavior is normal. Judgment and thought content normal.    Assessment/Plan: Recurrent ovarian cancer Partial small bowel obstruction Hyponatremia Hypokalemia Hypomagnesia Anemia Seizure disorder Hypertension  Pt seen and examined and discussed with pt's daughter-in-law. Needs aggressive electrolyte replacement Bowel rest, ng tube decompression, ivf  May have ice chips Repeat xrays in AM No signs of peritonitis. abd is nice  and soft. Hopefully this will resolve without surgery. Dr Daphine Deutscher is the [facility] surgeon rounding at [facility] this week.  Will follow  Mary Sella. Andrey Campanile, MD, FACS General, Bariatric, & Minimally Invasive Surgery [facility] Surgery, [place]   [facility] M 05/24/2011, 1:50 PM

## 2011-05-24 NOTE — Progress Notes (Signed)
Regina West   DOB:1929-05-27   ZO#:[id number]   JWJ#:[id number]  Subjective: Regina West was seen in the office yesterday feeling weak and nauseated; she was hydrated but did not improve and was noted to be vomiting small amounts of bile; the abdomen was distended and uncomfortable. She was transported to the ED where abd/pelvic CT confirmed SBO; attempts to place ng tube x3 were unsuccessful likely due to patient's large HH; this was accomplished under fluoro today. This evening she is clinically more alert and comfortable, feeling weak; denies pain; not passing gas or stool; urinating normally. Grand-daughter in room   Objective:  Elderly white woman examined in bed Filed Vitals:   05/24/11 1749  BP: 143/67  Pulse: 94  Temp: 98.7 F (37.1 C)  Resp: 16    There is no height or weight on file to calculate BMI.  Intake/Output Summary (Last 24 hours) at 05/24/11 2100 Last data filed at 05/24/11 1845  Gross per 24 hour  Intake      0 ml  Output    500 ml  Net   -500 ml   Ng container shows approx. 750 cc bilious liquid, no evidence of blood     CBG (last 3)  No results found for this basename: GLUCAP:3 in the last 72 hours   Labs:  Lab Results  Component Value Date   WBC 2.7* 05/24/2011   HGB 11.2* 05/24/2011   HCT 31.7* 05/24/2011   MCV 92.7 05/24/2011   PLT 202 05/24/2011   NEUTROABS 1.9 05/24/2011     Lab 05/24/11 1039 05/23/11 2017 05/23/11 1423  NA 129* 124* 126*  K 3.2* 3.4* 3.8  CL 92* 85* 85*  CO2 25 27 29   GLUCOSE 160* 196* 184*  BUN 20 22 21   CREATININE 1.03 1.11* 1.19*  CALCIUM 8.5 9.3 9.8  MG 1.3* -- --    Urine Studies No results found for this basename: UACOL:2,UAPR:2,USPG:2,UPH:2,UTP:2,UGL:2,UKET:2,UBIL:2,UHGB:2,UNIT:2,UROB:2,ULEU:2,UEPI:2,UWBC:2,URBC:2,UBAC:2,CAST:2,CRYS:2,UCOM:2,BILUA:2 in the last 72 hours     Studies:  Ct Abdomen Pelvis W Contrast  05/23/2011  *RADIOLOGY REPORT*  Clinical Data: Evaluate for small bowel obstruction.  CT ABDOMEN AND  PELVIS WITH CONTRAST  Technique:  Multidetector CT imaging of the abdomen and pelvis was performed following the standard protocol during bolus administration of intravenous contrast.  Contrast: OMNIPAQUE IOHEXOL 300 MG/ML IJ SOLN  Comparison: 05/02/2011  Findings: There are bilateral pleural effusions, right greater than left.  Atelectasis is noted within both lung bases.  The patient has a very large hiatal hernia.  The stomach and the small bowel loops are unremarkable.  The adrenal glands are normal. Gallbladder appears normal.  There is no biliary dilatation.  There is moderate atrophy of the pancreatic parenchyma.  Normal appearance of both kidneys.  No enlarged upper abdominal lymph nodes.  There is no pelvic or inguinal adenopathy.  The urinary bladder appears normal.  There is a small amount of free fluid within the pelvis.  Marked increased caliber of the proximal small bowel loops are noted measuring up to 4.1 cm.  Transition to decreased caliber small bowel loops is noted within the central portion of the abdomen, image 67.  The terminal ileum is collapsed.  There is a moderate amount of stool identified within the proximal colon.  Extensive sigmoid diverticulosis is noted without acute inflammation.  There is no evidence for bowel perforation.  No abscess formation is identified.  Small umbilical hernia contains fat only.  Review of the visualized osseous structures is significant for mild  scoliosis and multilevel spondylosis.  There is a retrolisthesis of L1 on L2 and L2 on L3.  Extensive facet degenerative changes noted.  IMPRESSION:  1.  Examination is positive for high-grade small bowel obstruction. The transition point is proximal to the terminal ileum within the central abdomen, image 67 of the axial series. 2.  Very large hiatal hernia.  3.  Bilateral pleural effusions, right greater than left.  Original Report Authenticated By: Rosealee Albee, M.D.   Dg Abd 2 Views  05/24/2011   *RADIOLOGY REPORT*  Clinical Data: Abdominal distention.  Nasogastric tube placed.  ABDOMEN - 2 VIEW  Comparison: CT abdomen pelvis and acute abdominal series 05/23/2011 .  Findings: The lung bases and upper aspect of the abdomen are not included on these images.  Distal aspect of the nasogastric tube projects over the expected location of the proximal stomach.  Right side up decubitus view is submitted.  Evaluation for free air is limited by overexposure of the far lateral right abdomen.  No gross evidence of free air is seen.  There are air-fluid levels and multiple dilated loops of small bowel.  Small bowel dilatation measures up to least 4.5 cm.  There is stool and a small amount of gas visualized in the proximal colon.  Oral contrast material is seen in the visualized portion of the upper urinary bladder (inferior pelvis not completely included on the image). This contrast is from CT abdomen pelvis performed yesterday.  IMPRESSION:  1.  Persistent high-grade small bowel obstruction pattern. 2.  No gross evidence of free intraperitoneal air. 3.  Nasogastric tube terminates in the proximal stomach (the stomach is not completely included on this image.  Original Report Authenticated By: Britta Mccreedy, M.D.   Dg Abd Acute W/chest  05/23/2011  *RADIOLOGY REPORT*  Clinical Data: Pain and weakness.  ACUTE ABDOMEN SERIES (ABDOMEN 2 VIEW & CHEST 1 VIEW)  Comparison: None.  Findings: Right greater than left pleural effusions.  No infiltrates or nodules.  Cardiomegaly.  Calcified tortuous aorta. Large fluid-filled hiatal hernia.  Dilated small bowel loops with air-fluid levels suggesting partial obstruction.  Moderate stool burden.  Degenerative change lumbar spine and hips. Small bowel obstruction was not present on previous CT.  IMPRESSION: Partial small bowel obstruction is suspected.  Right greater than left pleural effusions with large hiatal hernia.  Original Report Authenticated By: Elsie Stain, M.D.   Dg  Naso G Tube Plc Lanell Persons Blima Rich  05/24/2011  Fluoroscopically guided nasogastric tube placement.  Clinical history:  Small bowel obstruction.  Hiatal hernia.  Fluoroscopic time:  2.26 minutes.  Procedure:  After failed attempt at placing nasogastric tube without fluoroscopic guidance, fluoroscopically guided placement was requested.  Nasogastric tube was placed into the hiatal hernia and became curled within such.  This was not able to be advanced. As there was drainage of green bilious material, nasogastric tube was left in this position.  Impression:  Nasogastric tube curled within the hiatal hernia.  Original Report Authenticated By: Fuller Canada, M.D.    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 After these problems resolved she received 2 additional cycles of single-agent carboplatin completed in March 2011   (2) She had her first recurrence February  2012 treated with carboplatin and Gemzar for a total of 6 cycles between February and June 2012. Her CEA-125 had normalized before the beginning of cycle 5.   (1) no in second recurrence, s/p carboplatin given last week, doxil held pending echo results (which did show an adequate EF)  Plan: greatly appreciate the ED, surgery  and hospitalist services' help to this patient. Transfer to 3d-floor is anticipated later tonight. I will repeat labs in AM and consider proceeding with doxil treatment then if appropriate. Anticipate taking patient on to my service in AM   [facility] C 05/24/2011

## 2011-05-25 ENCOUNTER — Encounter: Payer: Self-pay | Admitting: Family

## 2011-05-25 ENCOUNTER — Inpatient Hospital Stay (HOSPITAL_COMMUNITY): Payer: Medicare Other

## 2011-05-25 LAB — DIFFERENTIAL
Basophils Relative: 0 % (ref 0–1)
Eosinophils Absolute: 0.1 10*3/uL (ref 0.0–0.7)
Monocytes Absolute: 0.4 10*3/uL (ref 0.1–1.0)
Neutro Abs: 1.5 10*3/uL — ABNORMAL LOW (ref 1.7–7.7)

## 2011-05-25 LAB — COMPREHENSIVE METABOLIC PANEL
ALT: 17 U/L (ref 0–35)
AST: 19 U/L (ref 0–37)
CO2: 25 mEq/L (ref 19–32)
Chloride: 97 mEq/L (ref 96–112)
GFR calc non Af Amer: 54 mL/min — ABNORMAL LOW (ref >90)
Sodium: 132 mEq/L — ABNORMAL LOW (ref 135–145)
Total Bilirubin: 0.4 mg/dL (ref 0.3–1.2)

## 2011-05-25 LAB — CBC
MCV: 95.1 fL (ref 78.0–100.0)
Platelets: 187 10*3/uL (ref 150–400)
RBC: 3.48 MIL/uL — ABNORMAL LOW (ref 3.87–5.11)
WBC: 2.5 10*3/uL — ABNORMAL LOW (ref 4.0–10.5)

## 2011-05-25 MED ORDER — VITAMINS A & D EX OINT
TOPICAL_OINTMENT | CUTANEOUS | Status: AC
  Administered 2011-05-25: 1
  Filled 2011-05-25: qty 5

## 2011-05-25 MED ORDER — COLD PACK MISC ONCOLOGY
1.0000 | Freq: Once | Status: AC | PRN
  Filled 2011-05-25: qty 1

## 2011-05-25 MED ORDER — BISACODYL 10 MG RE SUPP
10.0000 mg | Freq: Once | RECTAL | Status: AC
  Administered 2011-05-25: 10 mg via RECTAL
  Filled 2011-05-25: qty 1

## 2011-05-25 MED ORDER — POTASSIUM CHLORIDE IN NACL 20-0.9 MEQ/L-% IV SOLN
INTRAVENOUS | Status: DC
  Administered 2011-05-25: 20:00:00 via INTRAVENOUS
  Administered 2011-05-26: 1000 mL via INTRAVENOUS
  Filled 2011-05-25 (×3): qty 1000

## 2011-05-25 MED ORDER — HEPARIN SOD (PORK) LOCK FLUSH 100 UNIT/ML IV SOLN: 500.0000 [IU] | Freq: Once | INTRAVENOUS | Status: AC | PRN

## 2011-05-25 MED ORDER — SODIUM CHLORIDE 0.9 % IJ SOLN: 10.0000 mL | INTRAMUSCULAR | Status: DC | PRN

## 2011-05-25 MED ORDER — SODIUM CHLORIDE 0.9 % IJ SOLN: 3.0000 mL | INTRAMUSCULAR | Status: DC | PRN

## 2011-05-25 MED ORDER — HEPARIN SOD (PORK) LOCK FLUSH 100 UNIT/ML IV SOLN: 250.0000 [IU] | Freq: Once | INTRAVENOUS | Status: AC | PRN

## 2011-05-25 MED ORDER — ALTEPLASE 2 MG IJ SOLR
2.0000 mg | Freq: Once | INTRAMUSCULAR | Status: AC | PRN
  Filled 2011-05-25: qty 2

## 2011-05-25 MED ORDER — DOXORUBICIN HCL LIPOSOMAL CHEMO INJECTION 2 MG/ML
20.0000 mg/m2 | Freq: Once | INTRAVENOUS | Status: AC
  Administered 2011-05-25: 36 mg via INTRAVENOUS
  Filled 2011-05-25: qty 18

## 2011-05-25 MED ORDER — POTASSIUM CHLORIDE 10 MEQ/100ML IV SOLN
10.0000 meq | INTRAVENOUS | Status: AC
  Administered 2011-05-25 (×4): 10 meq via INTRAVENOUS
  Filled 2011-05-25 (×4): qty 100

## 2011-05-25 MED ORDER — INSULIN ASPART 100 UNIT/ML ~~LOC~~ SOLN
0.0000 [IU] | Freq: Four times a day (QID) | SUBCUTANEOUS | Status: DC
  Administered 2011-05-26: 7 [IU] via SUBCUTANEOUS
  Administered 2011-05-26: 5 [IU] via SUBCUTANEOUS

## 2011-05-25 MED ORDER — SODIUM CHLORIDE 0.9 % IV SOLN
8.0000 mg | Freq: Once | INTRAVENOUS | Status: AC
  Administered 2011-05-25: 8 mg via INTRAVENOUS
  Filled 2011-05-25: qty 4

## 2011-05-25 MED ORDER — DEXAMETHASONE SODIUM PHOSPHATE 10 MG/ML IJ SOLN
10.0000 mg | Freq: Once | INTRAMUSCULAR | Status: AC
  Administered 2011-05-25: 10 mg via INTRAVENOUS
  Filled 2011-05-25: qty 1

## 2011-05-25 MED ORDER — TRACE MINERALS CR-CU-MN-SE-ZN 10-1000-500-60 MCG/ML IV SOLN
INTRAVENOUS | Status: AC
  Administered 2011-05-25: 19:00:00 via INTRAVENOUS
  Filled 2011-05-25: qty 1000

## 2011-05-25 MED ORDER — FAT EMULSION 20 % IV EMUL
250.0000 mL | INTRAVENOUS | Status: AC
  Administered 2011-05-25: 250 mL via INTRAVENOUS
  Filled 2011-05-25: qty 250

## 2011-05-25 NOTE — Progress Notes (Signed)
ID: Regina West   DOB: Dec 09, 1929  MR#: [id number]  YQM#:[id number]  HISTORY OF PRESENT ILLNESS: The patient originally presented in the summer of 2010 with cramps and abdominal distension.  I do not have a copy of the initial evaluation, but on November 26, 2008, the patient underwent optimal debulking with bilateral salpingo-oophorectomy, omentectomy, and the placement of an intraperitoneal port.  The pathology from that procedure (Accession Number [id number] at [facility]) showed first of all, significant involvement of the omentum, minimal involvement of the right ovary and right fallopian tube and negative on the left ovary and fallopian tube, with neither ovary being enlarged, consistent with a primary peritoneal serous adenocarcinoma, described as moderately differentiated.  The sample included no lymph nodes.    The patient had an intraperitoneal port placed in the same surgery and was treated with intraperitoneal and IV chemotherapy according to GOG-252 but I am not sure which arm she was in.  (Arm 2 did carboplatin intraperitoneally and Taxol IV.  Arm 3 did cisplatin and paclitaxel intraperitoneally with paclitaxel IV.)  All arms received bevacizumab.  Unfortunately, after 4 cycles of treatment, she had acute mental status changes and was admitted here January of 2011 with what proved to be posterior reversible leukoencephalopathy and SIADH.  She had seizures, aphasia, and required intubation.  Once the patient recovered from this, she was treated with 2 cycles of single-agent carboplatin (I do not have the AUC). Her last adjuvant treatment was May 12, 2009, and her CA-125 at that time was 10.3.  Her intraperitoneal port was removed in April of 2011.    More recently, the patient was in routine follow up when her CA-125 was found to jump up to 2,269.7 (March 26, 2010).  This is 10 months after her last prior chemotherapy.  She had CTs of the chest, abdomen and pelvis January 26th which showed  ascites and enhancing peritoneal nodularity. She had had similar findings at presentation but these had completely resolved by the time she finished treatment in March of 2011.    The patient was felt to be in first relapse. Subsequent treatments are as summarized below   INTERVAL HISTORY: Patient is seen in Amy Berry's absence, is scheduled for Doxil today. Is accompanied by daughter in law. Has felt poorly for several days with anorexia, nausea and vomiting. No focal pain, but describes diffuse discomfort in the lower abdomen. Small bowel movement 2 days ago, attributes no bowel movement to the fact she is not eating. Has history of diverticular disease which was treated by primary care physician Dr. Jacky Kindle with Flagyl and Cipro, completed early March. Urination is normal, described as dark in color. She feels a little bit depressed, stating it is the first time she has felt a sense of hopelessness.   Has abdominal distention, paracentesis was attempted with no return. A detailed review of systems was otherwise stable.  ADVANCED DIRECTIVES: in place  HEALTH MAINTENANCE: History  Substance Use Topics  . Smoking status: Former Smoker    Quit date: 05/24/1959  . Smokeless tobacco: Never Used  . Alcohol Use: No     Colonoscopy: 2008  PAP: UTD  MM: refuses  Allergies  Allergen Reactions  . Avastin (Bevacizumab) Other (See Comments)    Swelling of the brain     Current Facility-Administered Medications  Medication Dose Route Frequency Provider Last Rate Last Dose  . 0.9 %  sodium chloride infusion   Intravenous Once Theotis Barrio, FNP  No current outpatient prescriptions on file.   Facility-Administered Medications Ordered in Other Visits  Medication Dose Route Frequency Provider Last Rate Last Dose  . 0.9 % NaCl with KCl 20 mEq/ L  infusion   Intravenous Continuous Berkley Harvey, [place] 125 mL/hr at 05/25/11 0454    . 0.9 % NaCl with KCl 20 mEq/ L  infusion    Intravenous Continuous Berkley Harvey, PHARMD      . alteplase (CATHFLO ACTIVASE) injection 2 mg  2 mg Intracatheter Once PRN Lowella Dell, MD      . bisacodyl (DULCOLAX) suppository 10 mg  10 mg Rectal Once Minda Meo, MD      . Deeann Cree Pack 1 packet  1 packet Topical Once PRN Lowella Dell, MD      . dexamethasone (DECADRON) injection 10 mg  10 mg Intravenous Once Lowella Dell, MD      . DOXOrubicin HCL LIPOSOMAL (DOXIL) 36 mg in dextrose 5 % 250 mL chemo infusion  20 mg/m2 (Treatment Plan Actual) Intravenous Once Lowella Dell, MD      . tpn solution (CLINIMIX E 5/20) 1,000 mL with multivitamins adult 10 mL, trace elements Cr-Cu-Mn-Se-Zn 1 mL infusion   Intravenous Continuous TPN Berkley Harvey, PHARMD       And  . fat emulsion 20 % infusion 250 mL  250 mL Intravenous Continuous TPN Berkley Harvey, PHARMD      . heparin lock flush 100 unit/mL  500 Units Intracatheter Once PRN Lowella Dell, MD      . heparin lock flush 100 unit/mL  250 Units Intracatheter Once PRN Lowella Dell, MD      . HYDROmorphone (DILAUDID) injection 0.5 mg  0.5 mg Intravenous Q4H PRN Minda Meo, MD   0.5 mg at 05/25/11 1230  . insulin aspart (novoLOG) injection 0-9 Units  0-9 Units Subcutaneous Q6H Berkley Harvey, PHARMD      . levETIRAcetam (KEPPRA) 250 mg in sodium chloride 0.9 % 100 mL IVPB  250 mg Intravenous Q12H Ward Givens, MD   250 mg at 05/25/11 0015  . morphine 4 MG/ML injection 4 mg  4 mg Intravenous Q2H PRN Ward Givens, MD   4 mg at 05/24/11 1140  . ondansetron (ZOFRAN) 8 mg in sodium chloride 0.9 % 50 mL IVPB  8 mg Intravenous Once Lowella Dell, MD      . ondansetron [facility]) injection 4 mg  4 mg Intravenous Q6H PRN Minda Meo, MD   4 mg at 05/24/11 1140  . pantoprazole (PROTONIX) injection 40 mg  40 mg Intravenous Q24H Minda Meo, MD   40 mg at 05/24/11 1419  . potassium chloride 10 mEq in 100 mL IVPB  10 mEq Intravenous Q1  Hr x 4 Minda Meo, MD   10 mEq at 05/25/11 1000  . sodium chloride 0.9 % injection 10 mL  10 mL Intracatheter PRN Lowella Dell, MD      . sodium chloride 0.9 % injection 3 mL  3 mL Intravenous PRN Lowella Dell, MD      . DISCONTD: 0.9 %  sodium chloride infusion   Intravenous Continuous Ward Givens, MD      . DISCONTD: 0.9 %  sodium chloride infusion   Intravenous Continuous Minda Meo, MD        OBJECTIVE: Elderly white woman who appears anxious Filed Vitals:   05/23/11 1435  BP: 168/99  Pulse: 94  Temp: 98.5 F (36.9 C)     Body mass index is 25.18 kg/(m^2).    ECOG FS: 2  Sclerae unicteric Oropharynx clear No peripheral adenopathy Lungs no rales or rhonchi; fair excursion bilaterally Heart regular rate and rhythm Abd distended, not terse, nontender, no organomegaly, no masses palpated.  MSK no focal spinal tenderness, no peripheral edema Neuro: nonfocal  LAB RESULTS:  Lab Results  Component Value Date   CA125 426.8* 05/23/2011    ASSESSMENT: 78 year old [place] woman with:  1. Recurrent ovarian cancer, on Carbo with Doxil, due treatment today.  2. 5 day history nausea, anorexia and vomiting  PLAN:  1. Hold Doxil today, give fluids.  2. Return tomorrow for fluids and treatment with Doxil. She will be seen in treatment area by Dr. Darnelle Catalan for assessment.     Nahia Nissan FNP   05/25/2011

## 2011-05-25 NOTE — Progress Notes (Signed)
Subjective: Less distended, 850 ml thru NG today  Objective: Vital signs in last 24 hours: Temp:  [98.5 F (36.9 C)-98.7 F (37.1 C)] 98.5 F (36.9 C) (03/20 1425) Pulse Rate:  [96-100] 96  (03/20 1425) Resp:  [16-18] 18  (03/20 1425) BP: (135-143)/(71-81) 135/71 mmHg (03/20 1425) SpO2:  [92 %-96 %] 96 % (03/20 1425) Weight:  [70.761 kg (156 lb)] 70.761 kg (156 lb) (03/20 1300) Last BM Date: 05/19/11  Intake/Output from previous day: 03/19 0701 - 03/20 0700 In: 3818 [I.V.:3818] Out: 500  Intake/Output this shift: Total I/O In: 0  Out: 1750 [Urine:900; Emesis/NG output:850]  General appearance: alert, appears stated age and no distress Resp: clear to auscultation bilaterally GI: up on bedside commode, a little flatus, minimal distension  hypoactive BS  Lab Results:   Basename 05/25/11 0500 05/24/11 1039  WBC 2.5* 2.7*  HGB 11.3* 11.2*  HCT 33.1* 31.7*  PLT 187 202    BMET  Basename 05/25/11 0500 05/24/11 1039  NA 132* 129*  K 3.5 3.2*  CL 97 92*  CO2 25 25  GLUCOSE 89 160*  BUN 15 20  CREATININE 0.97 1.03  CALCIUM 8.4 8.5   PT/INR No results found for this basename: LABPROT:2,INR:2 in the last 72 hours   Lab 05/25/11 0500 05/23/11 2017 05/23/11 1423  AST 19 24 24   ALT 17 24 27   ALKPHOS 82 108 116  BILITOT 0.4 0.5 0.6  PROT 5.4* 6.8 7.1  ALBUMIN 2.9* 3.7 3.9     Lipase     Component Value Date/Time   LIPASE 35 05/23/2011 2017     Studies/Results: Dg Abd 1 View  05/25/2011  *RADIOLOGY REPORT*  Clinical Data: Small bowel obstruction, follow-up  ABDOMEN - 1 VIEW  Comparison: Abdomen films of 05/24/2011  Findings: There are persistently dilated small bowel loops consistent with partial small bowel obstruction.  No colonic distention is seen although some air is noted within the rectum. No opaque calculi are seen.  IMPRESSION: Persistent gaseous distention of small bowel consistent with partial small bowel obstruction.  Original Report Authenticated  By: Juline Patch, M.D.   Ct Abdomen Pelvis W Contrast  05/23/2011  *RADIOLOGY REPORT*  Clinical Data: Evaluate for small bowel obstruction.  CT ABDOMEN AND PELVIS WITH CONTRAST  Technique:  Multidetector CT imaging of the abdomen and pelvis was performed following the standard protocol during bolus administration of intravenous contrast.  Contrast: OMNIPAQUE IOHEXOL 300 MG/ML IJ SOLN  Comparison: 05/02/2011  Findings: There are bilateral pleural effusions, right greater than left.  Atelectasis is noted within both lung bases.  The patient has a very large hiatal hernia.  The stomach and the small bowel loops are unremarkable.  The adrenal glands are normal. Gallbladder appears normal.  There is no biliary dilatation.  There is moderate atrophy of the pancreatic parenchyma.  Normal appearance of both kidneys.  No enlarged upper abdominal lymph nodes.  There is no pelvic or inguinal adenopathy.  The urinary bladder appears normal.  There is a small amount of free fluid within the pelvis.  Marked increased caliber of the proximal small bowel loops are noted measuring up to 4.1 cm.  Transition to decreased caliber small bowel loops is noted within the central portion of the abdomen, image 67.  The terminal ileum is collapsed.  There is a moderate amount of stool identified within the proximal colon.  Extensive sigmoid diverticulosis is noted without acute inflammation.  There is no evidence for bowel perforation.  No abscess formation is identified.  Small umbilical hernia contains fat only.  Review of the visualized osseous structures is significant for mild scoliosis and multilevel spondylosis.  There is a retrolisthesis of L1 on L2 and L2 on L3.  Extensive facet degenerative changes noted.  IMPRESSION:  1.  Examination is positive for high-grade small bowel obstruction. The transition point is proximal to the terminal ileum within the central abdomen, image 67 of the axial series. 2.  Very large hiatal  hernia.  3.  Bilateral pleural effusions, right greater than left.  Original Report Authenticated By: Rosealee Albee, M.D.   Dg Abd 2 Views  05/24/2011  *RADIOLOGY REPORT*  Clinical Data: Abdominal distention.  Nasogastric tube placed.  ABDOMEN - 2 VIEW  Comparison: CT abdomen pelvis and acute abdominal series 05/23/2011 .  Findings: The lung bases and upper aspect of the abdomen are not included on these images.  Distal aspect of the nasogastric tube projects over the expected location of the proximal stomach.  Right side up decubitus view is submitted.  Evaluation for free air is limited by overexposure of the far lateral right abdomen.  No gross evidence of free air is seen.  There are air-fluid levels and multiple dilated loops of small bowel.  Small bowel dilatation measures up to least 4.5 cm.  There is stool and a small amount of gas visualized in the proximal colon.  Oral contrast material is seen in the visualized portion of the upper urinary bladder (inferior pelvis not completely included on the image). This contrast is from CT abdomen pelvis performed yesterday.  IMPRESSION:  1.  Persistent high-grade small bowel obstruction pattern. 2.  No gross evidence of free intraperitoneal air. 3.  Nasogastric tube terminates in the proximal stomach (the stomach is not completely included on this image.  Original Report Authenticated By: Britta Mccreedy, M.D.   Dg Abd Acute W/chest  05/23/2011  *RADIOLOGY REPORT*  Clinical Data: Pain and weakness.  ACUTE ABDOMEN SERIES (ABDOMEN 2 VIEW & CHEST 1 VIEW)  Comparison: None.  Findings: Right greater than left pleural effusions.  No infiltrates or nodules.  Cardiomegaly.  Calcified tortuous aorta. Large fluid-filled hiatal hernia.  Dilated small bowel loops with air-fluid levels suggesting partial obstruction.  Moderate stool burden.  Degenerative change lumbar spine and hips. Small bowel obstruction was not present on previous CT.  IMPRESSION: Partial small bowel  obstruction is suspected.  Right greater than left pleural effusions with large hiatal hernia.  Original Report Authenticated By: Elsie Stain, M.D.   Dg Naso G Tube Plc Lanell Persons Blima Rich  05/24/2011  Fluoroscopically guided nasogastric tube placement.  Clinical history:  Small bowel obstruction.  Hiatal hernia.  Fluoroscopic time:  2.26 minutes.  Procedure:  After failed attempt at placing nasogastric tube without fluoroscopic guidance, fluoroscopically guided placement was requested.  Nasogastric tube was placed into the hiatal hernia and became curled within such.  This was not able to be advanced. As there was drainage of green bilious material, nasogastric tube was left in this position.  Impression:  Nasogastric tube curled within the hiatal hernia.  Original Report Authenticated By: Fuller Canada, M.D.    Medications:    . bisacodyl  10 mg Rectal Once  . dexamethasone  10 mg Intravenous Once  . DOXOrubicin LIPOSOMAL  20 mg/m2 (Treatment Plan Actual) Intravenous Once  . insulin aspart  0-9 Units Subcutaneous Q6H  . levetiracetam  250 mg Intravenous Q12H  . ondansetron (ZOFRAN) IVPB ([facility])  8  mg Intravenous Once  . pantoprazole (PROTONIX) IV  40 mg Intravenous Q24H  . potassium chloride  10 mEq Intravenous Q1 Hr x 4  . vitamin A & D        Assessment/Plan 1.  Small bowel obstruction  2.  Ovarian cancer  3.  Nausea and vomiting  4.  Dehydration  5.  Hyponatremia  6.  Serum chloride decreased    Plan: Says she's less distended.  Continue conservative RX    LOS: 2 days    Zeriyah Wain 05/25/2011

## 2011-05-25 NOTE — Progress Notes (Signed)
Patient completed Doxil chemotherapy without any incident.  Tolerated very well.  Regina West [facility]  05/25/2011 4:55 PM

## 2011-05-25 NOTE — Progress Notes (Signed)
PARENTERAL NUTRITION CONSULT NOTE - INITIAL  Pharmacy Consult for TNA Indication: SBO, malnutrition  Allergies  Allergen Reactions  . Avastin (Bevacizumab) Other (See Comments)    Swelling of the brain     Patient Measurements:   Wt: 70.8 kg, Ht: 66 in  Vital Signs: Temp: 98.7 F (37.1 C) (03/20 0455) Temp src: Oral (03/20 0455) BP: 143/81 mmHg (03/20 0455) Pulse Rate: 100  (03/20 0455) Intake/Output from previous day: 03/19 0701 - 03/20 0700 In: 3818 [I.V.:3818] Out: 500  Intake/Output from this shift: Total I/O In: -  Out: 350 [Emesis/NG output:350]  Labs:  [facility] 05/25/11 0500 05/24/11 1039 05/23/11 2017  WBC 2.5* 2.7* 2.7*  HGB 11.3* 11.2* 12.3  HCT 33.1* 31.7* 35.0*  PLT 187 202 246  APTT -- -- --  INR -- -- --     Basename 05/25/11 0500 05/24/11 1039 05/23/11 2017 05/23/11 1423  NA 132* 129* 124* --  K 3.5 3.2* 3.4* --  CL 97 92* 85* --  CO2 25 25 27  --  GLUCOSE 89 160* 196* --  BUN 15 20 22  --  CREATININE 0.97 1.03 1.11* --  LABCREA -- -- -- --  CREAT24HRUR -- -- -- --  CALCIUM 8.4 8.5 9.3 --  MG 1.5 1.3* -- --  PHOS -- -- -- --  PROT 5.4* -- 6.8 7.1  ALBUMIN 2.9* -- 3.7 3.9  AST 19 -- 24 24  ALT 17 -- 24 27  ALKPHOS 82 -- 108 116  BILITOT 0.4 -- 0.5 0.6  BILIDIR -- -- -- --  IBILI -- -- -- --  PREALBUMIN -- -- -- --  TRIG -- -- -- --  CHOLHDL -- -- -- --  CHOL -- -- -- --   The CrCl is unknown because both a height and weight (above a minimum accepted value) are required for this calculation.   No results found for this basename: GLUCAP:3 in the last 72 hours  Medical History: Past Medical History  Diagnosis Date  . Cancer     ovarian, peritoneal serous adenocarcin  . Hypertension   . Hyperlipidemia   . Seizure disorder   . Hiatal hernia   . Increased glucose level     Medications:  Scheduled:    . bisacodyl  10 mg Rectal Once  . dexamethasone  10 mg Intravenous Once  . DOXOrubicin LIPOSOMAL  20 mg/m2 (Treatment Plan  Actual) Intravenous Once  . levetiracetam  250 mg Intravenous Q12H  . ondansetron (ZOFRAN) IVPB ([facility])  8 mg Intravenous Once  . pantoprazole (PROTONIX) IV  40 mg Intravenous Q24H  . potassium chloride  10 mEq Intravenous Q1 Hr x 4  . potassium chloride  10 mEq Intravenous Q1 Hr x 4   Infusions:    . 0.9 % NaCl with KCl 20 mEq / L 125 mL/hr at 05/25/11 0454  . DISCONTD: sodium chloride    . DISCONTD: sodium chloride      Insulin Requirements in the past 24 hours:  n/a  Current Nutrition:  Note that patient has not eaten x 2 weeks PTA  Current IVF: NaCl with 20 mEq KCL at 100 ml/hr  Assessment: 78 yo female admitted with SBO and extended period of not eating to start TNA for nutrition. Note that patient is at risk of refeeding syndrome due to prolonged malnutrition and will advance rate of TNA slowly as a result.  Nutritional Goals:  RD recommendations:  kCal 2100-2500  Protein 85-105g Fluid 2.1-2.5L  Clinimix E 5/20  at a goal rate of 80 ml/hr to provide: 96g protein daily, 2169 kcal/day on days with Lipids, 1689 kcal/day on days without Lipids, Avg 1896 kcal/day/week   Plan:   Start E 5/20 at 40 ml/hr today and plan to advance rate slowly due to increased risk of refeeding syndrome  Reduce IVF's to 50 ml/hr  Lipid at 10 ml/hr on MWF due to national shortage  MVI and trace elements on MWF only also due to national shortage   Hessie Knows, PharmD, BCPS pager [phone] 05/25/2011 11:00 AM

## 2011-05-25 NOTE — Progress Notes (Signed)
Subjective: Regina West feels much better this morning. No dominant pain. No vomiting. He's tolerating the NG tube well. She is mentally clear with some mild difficulty with word finding this is her baseline. Her granddaughter Regina West is at the bedside.  Objective: Vital signs in last 24 hours: Temp:  [98.5 F (36.9 C)-98.7 F (37.1 C)] 98.7 F (37.1 C) (03/20 0455) Pulse Rate:  [91-100] 100  (03/20 0455) Resp:  [16-18] 16  (03/20 0455) BP: (124-143)/(59-81) 143/81 mmHg (03/20 0455) SpO2:  [92 %-95 %] 92 % (03/20 0455) Weight change:   CBG (last 3)  No results found for this basename: GLUCAP:3 in the last 72 hours  Intake/Output from previous day: 03/19 0701 - 03/20 0700 In: -  Out: 500   Physical Exam: Patient is awake alert supine no distress. Good facial symmetry. No JVD or bruits. Lungs are clear bilaterally. Cardiovascular exam regular rate and rhythm with normal S1-S2 no murmur. Abdomen is much softer, nondistended, bowel sounds are now present. She is no peripheral edema. Pulses are intact. Joints appear normal. Neurologically she is baseline.   Lab Results:  Basename 05/25/11 0500 05/24/11 1039  NA 132* 129*  K 3.5 3.2*  CL 97 92*  CO2 25 25  GLUCOSE 89 160*  BUN 15 20  CREATININE 0.97 1.03  CALCIUM 8.4 8.5  MG 1.5 1.3*  PHOS -- --    Basename 05/25/11 0500 05/23/11 2017  AST 19 24  ALT 17 24  ALKPHOS 82 108  BILITOT 0.4 0.5  PROT 5.4* 6.8  ALBUMIN 2.9* 3.7    Basename 05/25/11 0500 05/24/11 1039  WBC 2.5* 2.7*  NEUTROABS 1.5* 1.9  HGB 11.3* 11.2*  HCT 33.1* 31.7*  MCV 95.1 92.7  PLT 187 202   Lab Results  Component Value Date   INR 0.91 03/19/2009   INR 0.94 03/18/2009   No results found for this basename: CKTOTAL:3,CKMB:3,CKMBINDEX:3,TROPONINI:3 in the last 72 hours  Basename 05/24/11 1130  TSH 1.220  T4TOTAL --  T3FREE --  THYROIDAB --   No results found for this basename: VITAMINB12:2,FOLATE:2,FERRITIN:2,TIBC:2,IRON:2,RETICCTPCT:2 in the  last 72 hours  Studies/Results: Ct Abdomen Pelvis W Contrast  05/23/2011  *RADIOLOGY REPORT*  Clinical Data: Evaluate for small bowel obstruction.  CT ABDOMEN AND PELVIS WITH CONTRAST  Technique:  Multidetector CT imaging of the abdomen and pelvis was performed following the standard protocol during bolus administration of intravenous contrast.  Contrast: OMNIPAQUE IOHEXOL 300 MG/ML IJ SOLN  Comparison: 05/02/2011  Findings: There are bilateral pleural effusions, right greater than left.  Atelectasis is noted within both lung bases.  The patient has a very large hiatal hernia.  The stomach and the small bowel loops are unremarkable.  The adrenal glands are normal. Gallbladder appears normal.  There is no biliary dilatation.  There is moderate atrophy of the pancreatic parenchyma.  Normal appearance of both kidneys.  No enlarged upper abdominal lymph nodes.  There is no pelvic or inguinal adenopathy.  The urinary bladder appears normal.  There is a small amount of free fluid within the pelvis.  Marked increased caliber of the proximal small bowel loops are noted measuring up to 4.1 cm.  Transition to decreased caliber small bowel loops is noted within the central portion of the abdomen, image 67.  The terminal ileum is collapsed.  There is a moderate amount of stool identified within the proximal colon.  Extensive sigmoid diverticulosis is noted without acute inflammation.  There is no evidence for bowel perforation.  No  abscess formation is identified.  Small umbilical hernia contains fat only.  Review of the visualized osseous structures is significant for mild scoliosis and multilevel spondylosis.  There is a retrolisthesis of L1 on L2 and L2 on L3.  Extensive facet degenerative changes noted.  IMPRESSION:  1.  Examination is positive for high-grade small bowel obstruction. The transition point is proximal to the terminal ileum within the central abdomen, image 67 of the axial series. 2.  Very large  hiatal hernia.  3.  Bilateral pleural effusions, right greater than left.  Original Report Authenticated By: Rosealee Albee, M.D.   Dg Abd 2 Views  05/24/2011  *RADIOLOGY REPORT*  Clinical Data: Abdominal distention.  Nasogastric tube placed.  ABDOMEN - 2 VIEW  Comparison: CT abdomen pelvis and acute abdominal series 05/23/2011 .  Findings: The lung bases and upper aspect of the abdomen are not included on these images.  Distal aspect of the nasogastric tube projects over the expected location of the proximal stomach.  Right side up decubitus view is submitted.  Evaluation for free air is limited by overexposure of the far lateral right abdomen.  No gross evidence of free air is seen.  There are air-fluid levels and multiple dilated loops of small bowel.  Small bowel dilatation measures up to least 4.5 cm.  There is stool and a small amount of gas visualized in the proximal colon.  Oral contrast material is seen in the visualized portion of the upper urinary bladder (inferior pelvis not completely included on the image). This contrast is from CT abdomen pelvis performed yesterday.  IMPRESSION:  1.  Persistent high-grade small bowel obstruction pattern. 2.  No gross evidence of free intraperitoneal air. 3.  Nasogastric tube terminates in the proximal stomach (the stomach is not completely included on this image.  Original Report Authenticated By: Britta Mccreedy, M.D.   Dg Abd Acute W/chest  05/23/2011  *RADIOLOGY REPORT*  Clinical Data: Pain and weakness.  ACUTE ABDOMEN SERIES (ABDOMEN 2 VIEW & CHEST 1 VIEW)  Comparison: None.  Findings: Right greater than left pleural effusions.  No infiltrates or nodules.  Cardiomegaly.  Calcified tortuous aorta. Large fluid-filled hiatal hernia.  Dilated small bowel loops with air-fluid levels suggesting partial obstruction.  Moderate stool burden.  Degenerative change lumbar spine and hips. Small bowel obstruction was not present on previous CT.  IMPRESSION: Partial small  bowel obstruction is suspected.  Right greater than left pleural effusions with large hiatal hernia.  Original Report Authenticated By: Elsie Stain, M.D.   Dg Naso G Tube Plc Lanell Persons Blima Rich  05/24/2011  Fluoroscopically guided nasogastric tube placement.  Clinical history:  Small bowel obstruction.  Hiatal hernia.  Fluoroscopic time:  2.26 minutes.  Procedure:  After failed attempt at placing nasogastric tube without fluoroscopic guidance, fluoroscopically guided placement was requested.  Nasogastric tube was placed into the hiatal hernia and became curled within such.  This was not able to be advanced. As there was drainage of green bilious material, nasogastric tube was left in this position.  Impression:  Nasogastric tube curled within the hiatal hernia.  Original Report Authenticated By: Fuller Canada, M.D.     Assessment/Plan: #1 small bowel obstruction daily improving await morning films at this time, begin ambulation, continue to replace potassium  #2 ovarian cancer status post debulking and multiple rounds of chemotherapy  #3 metabolic-hyponatremia and hypokalemia currently slowly improving  #4 impaired glucose tolerance stable  #5 seizure disorder controlled on Keppra   LOS: 2  days   Kikue Gerhart A 05/25/2011, 7:02 AM

## 2011-05-25 NOTE — Progress Notes (Signed)
Patient to begin Doxil chemotherapy today.  Patient teaching completed by Dr. Darnelle Catalan and reinforced by RN.  Patient has no questions at this time regarding treatment plan.  Patient signed Chemotherapy Consent form.  Allayne Butcher [facility]  05/25/2011  2:04 PM

## 2011-05-25 NOTE — Progress Notes (Signed)
Patient ID: Regina West, female   DOB: 1929-10-20, 79 y.o.   MRN: [id number] [name] 78 y.o.  Body mass index is 25.18 kg/(m^2).  Patient Active Problem List  Diagnoses  . Ovarian cancer  . Small bowel obstruction  . Seizure disorder  . Hyponatremia    Allergies  Allergen Reactions  . Avastin (Bevacizumab) Other (See Comments)    Swelling of the brain     Past Surgical History  Procedure Date  . Abdominal surgery   . Power port placement   . Abdominal hysterectomy     omentectomy, b/l [facility]PO. at [facility]  . Appendectomy   . Cataract extraction, bilateral    ARONSON,RICHARD A, MD, MD 1. Small bowel obstruction   2. Ovarian cancer   3. Nausea and vomiting   4. Dehydration   5. Hyponatremia   6. Serum chloride decreased     Patient remains with NG tube in place. She is currently receiving Adriamycin. She is not complaining of any abdominal pain. Matt B. Daphine Deutscher, MD, [facility] [phone] beeper [phone]  05/25/2011 6:06 PM

## 2011-05-25 NOTE — Progress Notes (Signed)
Patient due to receive Doxil today.  Lab parameters are not met (WBC is 2.5).  Dr. Darnelle Catalan notified and order received to administer Doxil despite lab value.  Allayne Butcher [facility] 05/25/2011 1:24 PM

## 2011-05-25 NOTE — Progress Notes (Signed)
KATIANNA West   DOB:29-Apr-1929   GN#:[id number]   HQI#:[id number]  Subjective: Regina West is having some discomfort from the ng tube, but "I can deal with it." Son, daughter in law and grand-daughter in room. No gas or BM. Minimal abdominal discomfort. No nausea, on suction  Objective:  Elderly white woman examined in bed Filed Vitals:   05/25/11 0455  BP: 143/81  Pulse: 100  Temp: 98.7 F (37.1 C)  Resp: 16    There is no height or weight on file to calculate BMI.  Intake/Output Summary (Last 24 hours) at 05/25/11 0914 Last data filed at 05/25/11 0818  Gross per 24 hour  Intake   3818 ml  Output    850 ml  Net   2968 ml   Ng container shows approx. 750 cc bilious liquid, no evidence of blood Oropharynx shows no thrush No peripheral adenopathy Lungs no rales or rhonchi Heart regular rate and rhythm Abd moderately distended but not terse; no BS; NT No edema Alert and oriented x3 with appropriate affect    CBG (last 3)  No results found for this basename: GLUCAP:3 in the last 72 hours   Labs:  Lab Results  Component Value Date   WBC 2.5* 05/25/2011   HGB 11.3* 05/25/2011   HCT 33.1* 05/25/2011   MCV 95.1 05/25/2011   PLT 187 05/25/2011   NEUTROABS 1.5* 05/25/2011     Lab 05/25/11 0500 05/24/11 1039 05/23/11 2017 05/23/11 1423  NA 132* 129* 124* 126*  K 3.5 3.2* 3.4* 3.8  CL 97 92* 85* 85*  CO2 25 25 27 29   GLUCOSE 89 160* 196* 184*  BUN 15 20 22 21   CREATININE 0.97 1.03 1.11* 1.19*  CALCIUM 8.4 8.5 9.3 9.8  MG 1.5 1.3* -- --    Urine Studies No results found for this basename: UACOL:2,UAPR:2,USPG:2,UPH:2,UTP:2,UGL:2,UKET:2,UBIL:2,UHGB:2,UNIT:2,UROB:2,ULEU:2,UEPI:2,UWBC:2,URBC:2,UBAC:2,CAST:2,CRYS:2,UCOM:2,BILUA:2 in the last 72 hours     Studies:  Dg Abd 1 View  05/25/2011  *RADIOLOGY REPORT*  Clinical Data: Small bowel obstruction, follow-up  ABDOMEN - 1 VIEW  Comparison: Abdomen films of 05/24/2011  Findings: There are persistently dilated small bowel loops  consistent with partial small bowel obstruction.  No colonic distention is seen although some air is noted within the rectum. No opaque calculi are seen.  IMPRESSION: Persistent gaseous distention of small bowel consistent with partial small bowel obstruction.  Original Report Authenticated By: Juline Patch, M.D.   Ct Abdomen Pelvis W Contrast  05/23/2011  *RADIOLOGY REPORT*  Clinical Data: Evaluate for small bowel obstruction.  CT ABDOMEN AND PELVIS WITH CONTRAST  Technique:  Multidetector CT imaging of the abdomen and pelvis was performed following the standard protocol during bolus administration of intravenous contrast.  Contrast: OMNIPAQUE IOHEXOL 300 MG/ML IJ SOLN  Comparison: 05/02/2011  Findings: There are bilateral pleural effusions, right greater than left.  Atelectasis is noted within both lung bases.  The patient has a very large hiatal hernia.  The stomach and the small bowel loops are unremarkable.  The adrenal glands are normal. Gallbladder appears normal.  There is no biliary dilatation.  There is moderate atrophy of the pancreatic parenchyma.  Normal appearance of both kidneys.  No enlarged upper abdominal lymph nodes.  There is no pelvic or inguinal adenopathy.  The urinary bladder appears normal.  There is a small amount of free fluid within the pelvis.  Marked increased caliber of the proximal small bowel loops are noted measuring up to 4.1 cm.  Transition to decreased caliber small  bowel loops is noted within the central portion of the abdomen, image 67.  The terminal ileum is collapsed.  There is a moderate amount of stool identified within the proximal colon.  Extensive sigmoid diverticulosis is noted without acute inflammation.  There is no evidence for bowel perforation.  No abscess formation is identified.  Small umbilical hernia contains fat only.  Review of the visualized osseous structures is significant for mild scoliosis and multilevel spondylosis.  There is a retrolisthesis  of L1 on L2 and L2 on L3.  Extensive facet degenerative changes noted.  IMPRESSION:  1.  Examination is positive for high-grade small bowel obstruction. The transition point is proximal to the terminal ileum within the central abdomen, image 67 of the axial series. 2.  Very large hiatal hernia.  3.  Bilateral pleural effusions, right greater than left.  Original Report Authenticated By: Rosealee Albee, M.D.   Dg Abd 2 Views  05/24/2011  *RADIOLOGY REPORT*  Clinical Data: Abdominal distention.  Nasogastric tube placed.  ABDOMEN - 2 VIEW  Comparison: CT abdomen pelvis and acute abdominal series 05/23/2011 .  Findings: The lung bases and upper aspect of the abdomen are not included on these images.  Distal aspect of the nasogastric tube projects over the expected location of the proximal stomach.  Right side up decubitus view is submitted.  Evaluation for free air is limited by overexposure of the far lateral right abdomen.  No gross evidence of free air is seen.  There are air-fluid levels and multiple dilated loops of small bowel.  Small bowel dilatation measures up to least 4.5 cm.  There is stool and a small amount of gas visualized in the proximal colon.  Oral contrast material is seen in the visualized portion of the upper urinary bladder (inferior pelvis not completely included on the image). This contrast is from CT abdomen pelvis performed yesterday.  IMPRESSION:  1.  Persistent high-grade small bowel obstruction pattern. 2.  No gross evidence of free intraperitoneal air. 3.  Nasogastric tube terminates in the proximal stomach (the stomach is not completely included on this image.  Original Report Authenticated By: Britta Mccreedy, M.D.   Dg Abd Acute W/chest  05/23/2011  *RADIOLOGY REPORT*  Clinical Data: Pain and weakness.  ACUTE ABDOMEN SERIES (ABDOMEN 2 VIEW & CHEST 1 VIEW)  Comparison: None.  Findings: Right greater than left pleural effusions.  No infiltrates or nodules.  Cardiomegaly.  Calcified  tortuous aorta. Large fluid-filled hiatal hernia.  Dilated small bowel loops with air-fluid levels suggesting partial obstruction.  Moderate stool burden.  Degenerative change lumbar spine and hips. Small bowel obstruction was not present on previous CT.  IMPRESSION: Partial small bowel obstruction is suspected.  Right greater than left pleural effusions with large hiatal hernia.  Original Report Authenticated By: Elsie Stain, M.D.   Dg Naso G Tube Plc Lanell Persons Blima Rich  05/24/2011  Fluoroscopically guided nasogastric tube placement.  Clinical history:  Small bowel obstruction.  Hiatal hernia.  Fluoroscopic time:  2.26 minutes.  Procedure:  After failed attempt at placing nasogastric tube without fluoroscopic guidance, fluoroscopically guided placement was requested.  Nasogastric tube was placed into the hiatal hernia and became curled within such.  This was not able to be advanced. As there was drainage of green bilious material, nasogastric tube was left in this position.  Impression:  Nasogastric tube curled within the hiatal hernia.  Original Report Authenticated By: Fuller Canada, M.D.    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 After these problems resolved she received 2 additional cycles of single-agent carboplatin completed in March 2011   (2) She had her first recurrence February 2012 treated with carboplatin and Gemzar for a total of 6 cycles between February and June 2012. Her CEA-125 had normalized before the beginning of cycle 5.   (1) now in second recurrence, s/p carboplatin given last week, doxil held pending echo results (which did show  an adequate EF)  Plan: I am ready now to give her the remaining chemo from her first cycle, namely doxil. I discussed this with the patient and family and they understand the possible toxicities, side effects and complications. I contacted Dr Lanell Matar office and discussed with Dr Rockney Ghee will transfer case to my service. Encouraged pt. To get OOB and ambulate as tolerated. I am consulting nutrition re. Possible TNA   Brytani Voth C 05/25/2011

## 2011-05-25 NOTE — Progress Notes (Signed)
INITIAL ADULT NUTRITION ASSESSMENT Date: 05/25/2011   Time: 9:26 AM Reason for Assessment: Consult  ASSESSMENT: Female 78 y.o.  Dx: Small bowel obstruction  Hx:  Past Medical History  Diagnosis Date  . Cancer     ovarian, peritoneal serous adenocarcin  . Hypertension   . Hyperlipidemia   . Seizure disorder   . Hiatal hernia   . Increased glucose level    Related Meds:  Scheduled Meds:   . bisacodyl  10 mg Rectal Once  . levetiracetam  250 mg Intravenous Q12H  . pantoprazole (PROTONIX) IV  40 mg Intravenous Q24H  . potassium chloride  10 mEq Intravenous Q1 Hr x 4  . potassium chloride  10 mEq Intravenous Q1 Hr x 4   Continuous Infusions:   . 0.9 % NaCl with KCl 20 mEq / L 125 mL/hr at 05/25/11 0454  . DISCONTD: sodium chloride    . DISCONTD: sodium chloride     PRN Meds:.HYDROmorphone, morphine injection, ondansetron (ZOFRAN) IV, DISCONTD: ondansetron (ZOFRAN) IV, DISCONTD: ondansetron (ZOFRAN) IV  Ht:  5' 6'' (167.6cm)  Wt:  156 lb with significant abdominal distention  Ideal Wt:    130 lb % Ideal Wt: 120  Usual Wt: 150 lb % Usual Wt: 104  Body mass index of 25.3 kg/(m^2)  Food/Nutrition Related Hx: Pt admitted with c/o nausea for the past 15 days with abdominal distention. Pt with ovarian CA s/p debulking and multiple rounds of chemotherapy, currently back on chemotherapy for recurrence and pt finished her first round of chemotherapy 1 week ago. Pt found to have high grade SBO and an NGT was placed. Pt reports still having nausea. Pt reports being unable to eat anything for at least the past 10 days. Pt reports before this time she was not eating great and has experienced significant nausea with chemotherapy and even the site of food on television makes her nauseated. Pt states she has not experienced this before with her other chemotherapy treatments. Pt reports she has not had a BM in 2 weeks. Noted MD had mentioned possible TNA which pt is agreeable to.   CT of  abdomen/pelvis on 3/18 showed: 1. Examination is positive for high-grade small bowel obstruction.  The transition point is proximal to the terminal ileum within the  central abdomen, image 67 of the axial series.  2. Very large hiatal hernia.  3. Bilateral pleural effusions, right greater than left.   Labs:  CMP     Component Value Date/Time   NA 132* 05/25/2011 0500   NA 143 05/12/2011 1337   K 3.5 05/25/2011 0500   K 4.1 05/12/2011 1337   CL 97 05/25/2011 0500   CL 92* 05/12/2011 1337   CO2 25 05/25/2011 0500   CO2 32 05/12/2011 1337   GLUCOSE 89 05/25/2011 0500   GLUCOSE 173* 05/12/2011 1337   BUN 15 05/25/2011 0500   BUN 19 05/12/2011 1337   CREATININE 0.97 05/25/2011 0500   CREATININE 1.3* 05/12/2011 1337   CALCIUM 8.4 05/25/2011 0500   CALCIUM 9.8 05/12/2011 1337   PROT 5.4* 05/25/2011 0500   PROT 6.2* 05/12/2011 1337   ALBUMIN 2.9* 05/25/2011 0500   AST 19 05/25/2011 0500   AST 40* 05/12/2011 1337   ALT 17 05/25/2011 0500   ALKPHOS 82 05/25/2011 0500   ALKPHOS 95* 05/12/2011 1337   BILITOT 0.4 05/25/2011 0500   BILITOT 0.60 05/12/2011 1337   GFRNONAA 54* 05/25/2011 0500   GFRAA 63* 05/25/2011 0500   CBG (last 3)  No results found for this basename: GLUCAP:3 in the last 72 hours    Intake/Output Summary (Last 24 hours) at 05/25/11 0954 Last data filed at 05/25/11 0818  Gross per 24 hour  Intake   3818 ml  Output    850 ml  Net   2968 ml   NGT output - so far today, dark green Last BM - PTA  Diet Order: NPO    IVF:    0.9 % NaCl with KCl 20 mEq / L Last Rate: 125 mL/hr at 05/25/11 0454  DISCONTD: sodium chloride   DISCONTD: sodium chloride     Estimated Nutritional Needs:   Kcal: 2100-2500 Protein: 85-105g Fluid: 2.1-2.5L  NUTRITION DIAGNOSIS: -Inadequate oral intake (NI-2.1).  Status: Ongoing -Pt meets criteria for severe PCM of acute illness AEB <50% energy intake for the past 2 weeks and fluid accumulation in the form of abdominal distention with likely 6 pound fluid  weight gain from usual weight per pt report   RELATED TO: SBO, nausea  AS EVIDENCE BY: CT of abdomen/pelvis on 3/18, pt statement  MONITORING/EVALUATION(Goals): Initiation of TNA and for TNA to meet =/> 90% of estimated nutritional needs.   EDUCATION NEEDS: -No education needs identified at this time  INTERVENTION: Recommend TNA initiation with slow advancement as pt at very high risk of refeeding syndrome as pt not eating for 2 weeks PTA. Recommend advancing to goal rate over the course of 3-10 days with advancement of 200-300 calories every 3-4 days and repletion of potassium, phosphorus, and magnesium. Recommend initial calories should be 20 calories/kg and no more than 150-200g/day of carbohydrate. MD to closely monitor for refeeding syndrome.   Dietitian #: [phone]  DOCUMENTATION CODES Per approved criteria  -Severe malnutrition in the context of acute illness or injury    Marshall Cork 05/25/2011, 9:26 AM

## 2011-05-26 ENCOUNTER — Inpatient Hospital Stay (HOSPITAL_COMMUNITY): Payer: Medicare Other

## 2011-05-26 LAB — COMPREHENSIVE METABOLIC PANEL
Albumin: 3 g/dL — ABNORMAL LOW (ref 3.5–5.2)
BUN: 17 mg/dL (ref 6–23)
Calcium: 9.2 mg/dL (ref 8.4–10.5)
Creatinine, Ser: 0.96 mg/dL (ref 0.50–1.10)
GFR calc Af Amer: 64 mL/min — ABNORMAL LOW (ref >90)
Glucose, Bld: 299 mg/dL — ABNORMAL HIGH (ref 70–99)
Potassium: 3.8 mEq/L (ref 3.5–5.1)
Total Protein: 6.1 g/dL (ref 6.0–8.3)

## 2011-05-26 LAB — DIFFERENTIAL
Basophils Relative: 0 % (ref 0–1)
Eosinophils Absolute: 0 10*3/uL (ref 0.0–0.7)
Monocytes Absolute: 0.3 10*3/uL (ref 0.1–1.0)
Monocytes Relative: 8 % (ref 3–12)

## 2011-05-26 LAB — PHOSPHORUS: Phosphorus: 1.8 mg/dL — ABNORMAL LOW (ref 2.3–4.6)

## 2011-05-26 LAB — GLUCOSE, CAPILLARY
Glucose-Capillary: 282 mg/dL — ABNORMAL HIGH (ref 70–99)
Glucose-Capillary: 200 mg/dL — ABNORMAL HIGH (ref 70–99)
Glucose-Capillary: 162 mg/dL — ABNORMAL HIGH (ref 70–99)

## 2011-05-26 LAB — CBC
HCT: 34.7 % — ABNORMAL LOW (ref 36.0–46.0)
Hemoglobin: 12 g/dL (ref 12.0–15.0)
MCH: 32.4 pg (ref 26.0–34.0)
MCHC: 34.6 g/dL (ref 30.0–36.0)
MCV: 93.8 fL (ref 78.0–100.0)

## 2011-05-26 LAB — PREALBUMIN: Prealbumin: 9 mg/dL — ABNORMAL LOW (ref 17.0–34.0)

## 2011-05-26 LAB — MAGNESIUM: Magnesium: 1.8 mg/dL (ref 1.5–2.5)

## 2011-05-26 LAB — CHOLESTEROL, TOTAL: Cholesterol: 150 mg/dL (ref 0–200)

## 2011-05-26 MED ORDER — INSULIN ASPART 100 UNIT/ML ~~LOC~~ SOLN
0.0000 [IU] | SUBCUTANEOUS | Status: DC
  Administered 2011-05-26: 3 [IU] via SUBCUTANEOUS
  Administered 2011-05-26: 2 [IU] via SUBCUTANEOUS
  Administered 2011-05-26: 3 [IU] via SUBCUTANEOUS
  Administered 2011-05-26: 8 [IU] via SUBCUTANEOUS
  Administered 2011-05-27 (×3): 3 [IU] via SUBCUTANEOUS
  Administered 2011-05-27: 2 [IU] via SUBCUTANEOUS
  Administered 2011-05-27 (×2): 3 [IU] via SUBCUTANEOUS
  Administered 2011-05-28 (×2): 5 [IU] via SUBCUTANEOUS

## 2011-05-26 MED ORDER — INSULIN GLARGINE 100 UNIT/ML ~~LOC~~ SOLN
10.0000 [IU] | Freq: Every day | SUBCUTANEOUS | Status: DC
  Administered 2011-05-26 – 2011-05-27 (×2): 10 [IU] via SUBCUTANEOUS

## 2011-05-26 MED ORDER — BIOTENE DRY MOUTH MT LIQD
15.0000 mL | Freq: Two times a day (BID) | OROMUCOSAL | Status: DC
  Administered 2011-05-26 – 2011-06-07 (×24): 15 mL via OROMUCOSAL

## 2011-05-26 MED ORDER — PHENOL 1.4 % MT LIQD
1.0000 | Freq: Three times a day (TID) | OROMUCOSAL | Status: DC | PRN
  Administered 2011-05-26 – 2011-06-07 (×4): 1 via OROMUCOSAL
  Filled 2011-05-26 (×2): qty 177

## 2011-05-26 MED ORDER — CLINIMIX E/DEXTROSE (5/20) 5 % IV SOLN
INTRAVENOUS | Status: AC
  Administered 2011-05-26: 18:00:00 via INTRAVENOUS
  Filled 2011-05-26: qty 1000

## 2011-05-26 MED ORDER — SODIUM PHOSPHATE 3 MMOLE/ML IV SOLN
10.0000 mmol | Freq: Once | INTRAVENOUS | Status: AC
  Administered 2011-05-26: 10 mmol via INTRAVENOUS
  Filled 2011-05-26: qty 3.33

## 2011-05-26 NOTE — Progress Notes (Signed)
Patient ID: Regina West, female   DOB: 05-16-1929, 78 y.o.   MRN: [id number]    Subjective: NGT OP yesterday and only 50ml overnight She has had only minor abd pain and feels much less distended. She started passing some flatus, but no BM  Objective: Vital signs in last 24 hours: Temp:  [98 F (36.7 C)-98.6 F (37 C)] 98.6 F (37 C) (03/21 0545) Pulse Rate:  [83-102] 83  (03/21 0545) Resp:  [17-18] 17  (03/21 0545) BP: (133-153)/(71-82) 153/82 mmHg (03/21 0545) SpO2:  [93 %-96 %] 93 % (03/21 0545) Weight:  [70.761 kg (156 lb)] 70.761 kg (156 lb) (03/20 1300) Last BM Date: 05/25/11 (Small)  Intake/Output from previous day: 03/20 0701 - 03/21 0700 In: 1524.9 [I.V.:794.8; IV Piggyback:100; TPN:605.1] Out: 3900 [Urine:1850; Emesis/NG output:2050] Intake/Output this shift: Total I/O In: 0  Out: 100 [Urine:100]  General appearance: alert, appears stated age and no distress Resp: clear to auscultation bilaterally GI: up on bedside commode, a little flatus, minimal distension  hypoactive BS, less distended  Lab Results:   Basename 05/26/11 0410 05/25/11 0500  WBC 4.3 2.5*  HGB 12.0 11.3*  HCT 34.7* 33.1*  PLT 184 187    BMET  Basename 05/26/11 0410 05/25/11 0500  NA 131* 132*  K 3.8 3.5  CL 97 97  CO2 23 25  GLUCOSE 299* 89  BUN 17 15  CREATININE 0.96 0.97  CALCIUM 9.2 8.4   PT/INR No results found for this basename: LABPROT:2,INR:2 in the last 72 hours   Lab 05/26/11 0410 05/25/11 0500 05/23/11 2017 05/23/11 1423  AST 17 19 24 24   ALT 17 17 24 27   ALKPHOS 98 82 108 116  BILITOT 0.4 0.4 0.5 0.6  PROT 6.1 5.4* 6.8 7.1  ALBUMIN 3.0* 2.9* 3.7 3.9     Lipase     Component Value Date/Time   LIPASE 35 05/23/2011 2017     Studies/Results: Dg Abd 1 View  05/25/2011  *RADIOLOGY REPORT*  Clinical Data: Small bowel obstruction, follow-up  ABDOMEN - 1 VIEW  Comparison: Abdomen films of 05/24/2011  Findings: There are persistently dilated small bowel  loops consistent with partial small bowel obstruction.  No colonic distention is seen although some air is noted within the rectum. No opaque calculi are seen.  IMPRESSION: Persistent gaseous distention of small bowel consistent with partial small bowel obstruction.  Original Report Authenticated By: Juline Patch, M.D.   Dg Abd 2 Views  05/26/2011  *RADIOLOGY REPORT*  Clinical Data:  ABDOMEN - 2 VIEW  Comparison: None.  Findings: NG tube placement NG tube is coiled within a large hiatal hernia above the diaphragm.  There is interval decrease in size of dilated loops of small bowel which measures 3 cm compared to 4 cm on prior.  There are fluid levels in the small bowel.  There is gas within the rectosigmoid colon.  No free air on the left lateral decubitus view.  IMPRESSION:  1.  NG tube coiled within a large sliding type hiatal hernia above the diaphragm. 2.  Interval decrease in size of dilated loops of small bowel. 3.  Gas within the rectum. 4.  No free air.  Original Report Authenticated By: Genevive Bi, M.D.   Dg Abd 2 Views  05/24/2011  *RADIOLOGY REPORT*  Clinical Data: Abdominal distention.  Nasogastric tube placed.  ABDOMEN - 2 VIEW  Comparison: CT abdomen pelvis and acute abdominal series 05/23/2011 .  Findings: The lung bases and upper aspect  of the abdomen are not included on these images.  Distal aspect of the nasogastric tube projects over the expected location of the proximal stomach.  Right side up decubitus view is submitted.  Evaluation for free air is limited by overexposure of the far lateral right abdomen.  No gross evidence of free air is seen.  There are air-fluid levels and multiple dilated loops of small bowel.  Small bowel dilatation measures up to least 4.5 cm.  There is stool and a small amount of gas visualized in the proximal colon.  Oral contrast material is seen in the visualized portion of the upper urinary bladder (inferior pelvis not completely included on the image).  This contrast is from CT abdomen pelvis performed yesterday.  IMPRESSION:  1.  Persistent high-grade small bowel obstruction pattern. 2.  No gross evidence of free intraperitoneal air. 3.  Nasogastric tube terminates in the proximal stomach (the stomach is not completely included on this image.  Original Report Authenticated By: Britta Mccreedy, M.D.   Dg Naso G Tube Plc Lanell Persons Blima Rich  05/24/2011  Fluoroscopically guided nasogastric tube placement.  Clinical history:  Small bowel obstruction.  Hiatal hernia.  Fluoroscopic time:  2.26 minutes.  Procedure:  After failed attempt at placing nasogastric tube without fluoroscopic guidance, fluoroscopically guided placement was requested.  Nasogastric tube was placed into the hiatal hernia and became curled within such.  This was not able to be advanced. As there was drainage of green bilious material, nasogastric tube was left in this position.  Impression:  Nasogastric tube curled within the hiatal hernia.  Original Report Authenticated By: Fuller Canada, M.D.    Medications:    . antiseptic oral rinse  15 mL Mouth Rinse BID  . bisacodyl  10 mg Rectal Once  . dexamethasone  10 mg Intravenous Once  . DOXOrubicin LIPOSOMAL  20 mg/m2 (Treatment Plan Actual) Intravenous Once  . insulin aspart  0-15 Units Subcutaneous Q4H  . insulin glargine  10 Units Subcutaneous Daily  . levetiracetam  250 mg Intravenous Q12H  . ondansetron (ZOFRAN) IVPB ([facility])  8 mg Intravenous Once  . pantoprazole (PROTONIX) IV  40 mg Intravenous Q24H  . potassium chloride  10 mEq Intravenous Q1 Hr x 4  . sodium phosphate  Dextrose 5% IVPB  10 mmol Intravenous Once  . vitamin A & D      . DISCONTD: insulin aspart  0-9 Units Subcutaneous Q6H    Assessment/Plan 1.  Small bowel obstruction  2.  Ovarian cancer  3.  Nausea and vomiting  4.  Dehydration  5.  Hyponatremia  6.  Serum chloride decreased    Plan:   Continue conservative RX with NGT, but may be able to start some  clamping trials soon.    LOS: 3 days    [place],PA-C Pager [phone] General Trauma Pager [phone]

## 2011-05-26 NOTE — Progress Notes (Signed)
PARENTERAL NUTRITION CONSULT NOTE   Pharmacy Consult for TNA Indication: SBO, malnutrition  Allergies  Allergen Reactions  . Avastin (Bevacizumab) Other (See Comments)    Swelling of the brain     Patient Measurements: Height: 5\' 6"  (167.6 cm) Weight: 156 lb (70.761 kg) IBW/kg (Calculated) : 59.3  Wt: 70.8 kg, Ht: 66 in  Vital Signs: Temp: 98.6 F (37 C) (03/21 0545) Temp src: Oral (03/21 0545) BP: 153/82 mmHg (03/21 0545) Pulse Rate: 83  (03/21 0545) Intake/Output from previous day: 03/20 0701 - 03/21 0700 In: 1524.9 [I.V.:794.8; IV Piggyback:100; TPN:605.1] Out: 3400 [Urine:1850; Emesis/NG output:1550] Intake/Output from this shift: Total I/O In: -  Out: 100 [Urine:100]  Labs:  [facility] 05/26/11 0410 05/25/11 0500 05/24/11 1039  WBC 4.3 2.5* 2.7*  HGB 12.0 11.3* 11.2*  HCT 34.7* 33.1* 31.7*  PLT 184 187 202  APTT -- -- --  INR -- -- --     Basename 05/26/11 0410 05/25/11 0500 05/24/11 1039 05/23/11 2017  NA 131* 132* 129* --  K 3.8 3.5 3.2* --  CL 97 97 92* --  CO2 23 25 25  --  GLUCOSE 299* 89 160* --  BUN 17 15 20  --  CREATININE 0.96 0.97 1.03 --  LABCREA -- -- -- --  CREAT24HRUR -- -- -- --  CALCIUM 9.2 8.4 8.5 --  MG 1.8 1.5 1.3* --  PHOS 1.8* -- -- --  PROT 6.1 5.4* -- 6.8  ALBUMIN 3.0* 2.9* -- 3.7  AST 17 19 -- 24  ALT 17 17 -- 24  ALKPHOS 98 82 -- 108  BILITOT 0.4 0.4 -- 0.5  BILIDIR -- -- -- --  IBILI -- -- -- --  PREALBUMIN -- -- -- --  TRIG 120 -- -- --  CHOLHDL -- -- -- --  CHOL 150 -- -- --   Estimated Creatinine Clearance: 44.5 ml/min (by C-G formula based on Cr of 0.96).    Basename 05/26/11 0551 05/26/11 0002  GLUCAP 282* 302*    Medical History: Past Medical History  Diagnosis Date  . Cancer     ovarian, peritoneal serous adenocarcin  . Hypertension   . Hyperlipidemia   . Seizure disorder   . Hiatal hernia   . Increased glucose level     Medications:  Scheduled:     . antiseptic oral rinse  15 mL Mouth Rinse  BID  . bisacodyl  10 mg Rectal Once  . dexamethasone  10 mg Intravenous Once  . DOXOrubicin LIPOSOMAL  20 mg/m2 (Treatment Plan Actual) Intravenous Once  . insulin aspart  0-9 Units Subcutaneous Q6H  . levetiracetam  250 mg Intravenous Q12H  . ondansetron (ZOFRAN) IVPB ([facility])  8 mg Intravenous Once  . pantoprazole (PROTONIX) IV  40 mg Intravenous Q24H  . potassium chloride  10 mEq Intravenous Q1 Hr x 4  . vitamin A & D       Infusions:     . 0.9 % NaCl with KCl 20 mEq / L 100 mL/hr at 05/25/11 0730  . 0.9 % NaCl with KCl 20 mEq / L 50 mL/hr at 05/25/11 1943  . TPN (CLINIMIX) +/- additives 40 mL/hr at 05/25/11 1847   And  . fat emulsion 250 mL (05/25/11 1848)    Insulin Requirements in the past 24 hours:  12 units  Current Nutrition:  Note that patient has not eaten x 2 weeks PTA  Current IVF: NaCl with 20 mEq KCL at 50 ml/hr  Assessment: 78 yo female admitted  with SBO and extended period of not eating. Started TNA for nutrition last night. Note that patient is at risk of refeeding syndrome due to prolonged malnutrition and will advance rate of TNA slowly as a result. CBGs elevated on sensitive SSI QID. Phos Low. Na low (cannot change Na content in TNA), most likely due to volume status. Lipids wnl. PAB pending.   Nutritional Goals:  RD recommendations:  kCal 2100-2500  Protein 85-105g Fluid 2.1-2.5L  Clinimix E 5/20 at a goal rate of 80 ml/hr to provide: 96g protein daily, 2169 kcal/day on days with Lipids, 1689 kcal/day on days without Lipids, Avg 1896 kcal/day/week   Plan:   Phos supplementation with Na Phos IV  Increase SSI to Moderate Scale q4h; Start Lantus 10 units daily  Continue Clinimix E 5/20 at 40 ml/hr. Await CBG to normalize some before advancing rate and then will advance rate slowly due to increased risk of refeeding syndrome  AM BMET and Phos  Continue  MIVF's to 50 ml/hr  Lipid at 10 ml/hr on MWF due to national shortage  MVI and trace  elements on MWF only also due to national shortage  Gwen Her PharmD  [phone] 05/26/2011 7:51 AM

## 2011-05-26 NOTE — Progress Notes (Signed)
I have visited today with Regina West and discussed her case with Dr. Darnelle Catalan.  She has been transferred to his service to begin chemotherapy and [facility] will be signing off. 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 She is slowly improving with conservative therapy. Her moods and spirits are excellent. Again we will be signing off

## 2011-05-26 NOTE — Progress Notes (Signed)
Patient reports she was "tired". She ambulated in hallway today for about 20 minutes with family. "I think I did too much". Does not feel up to ambulating in hallway tonight.

## 2011-05-26 NOTE — Progress Notes (Signed)
Nutrition Brief Note  TNA: Clinimix E 5/20 @ 40 ml/hr.  Lipids (20% IVFE @ 10 ml/hr), multivitamins, and trace elements are provided 3 times weekly (MWF) due to national backorder.  Provides 1051 kcal and 48 grams protein daily (based on weekly average).  Meets 50% minimum estimated kcal and 56% minimum estimated protein needs.  Additional IVF of NS with KCl 40mEq/L @ 50 ml/hr.  Potassium - WNL, improved from 3.5 on 3/19 to 3.8 on 3/20, receiving KCl with IVF and 4 runs of KCl yesterday  Phosphorus - low at 1.8 on 3/20, supplementing with Na Phos IV   Magnesium - WNL, improved from 1.5 on 3/19 to 1.8 on 3/20  CBG (last 3)   Basename 05/26/11 0834 05/26/11 0551 05/26/11 0002  GLUCAP 253* 282* 302*   - Pharmacy increased SSI to moderate scale q4h and started Lantus 10 units daily   - DG of abdomen yesterday showed persistent gaseous distention of small bowel consistent with partial small bowel obstruction. DG of abdomen today showed: 1. NG tube coiled within a large sliding type hiatal hernia above the diaphragm. 2. Interval decrease in size of dilated loops of small bowel. 3. Gas within the rectum. 4. No free air.   - Pt reports passing some gas and spitting up some saliva. When asked about nausea pt stated she doesn't feel nauseated, just over all doesn't feel well. Pt reports last night she saw visions of [place], when asked if this was a dream pt stated that it may have been a vivid dream and denied hallucinations. Question maybe one of her medications is causing this? Total NGT output yesterday was 2,067ml. Noted possible plans to start NGT clamping trials soon. Will continue to monitor for TNA/diet advancement.   Dietitian # [phone]

## 2011-05-26 NOTE — Progress Notes (Signed)
Pt tolerated clamping NG tube per MD instruction for >1 hour two times throughout day.  Pt also ambulated entire length of hallway twice. Tolerated well. Will continue to monitor. Ivery Quale 05/26/2011 1900

## 2011-05-26 NOTE — Progress Notes (Signed)
Regina West   DOB:1929/09/20   WU#:[id number]   GNF#:[id number]  Subjective: Regina West was able to walk perhaps 15 minutes yesterday. She also set up in the chair perhaps as long as one hour total. She received her Doxil without event. This morning she feels tired, but is alert, tolerating the and G-tube moderately well. She thinks she has passed "a little gas". She is agitated and to take a shower   Objective: Elderly white female examined in bed Filed Vitals:   05/26/11 0545  BP: 153/82  Pulse: 83  Temp: 98.6 F (37 C)  Resp: 17    Body mass index is 25.18 kg/(m^2).  Intake/Output Summary (Last 24 hours) at 05/26/11 0904 Last data filed at 05/26/11 0844  Gross per 24 hour  Intake 1524.85 ml  Output   3650 ml  Net -2125.15 ml     Sclerae unicteric  Oropharynx clear; ng in place  No peripheral adenopathy  Lungs auscultated anteriorly -- no rales or rhonchi  Heart regular rate and rhythm  Abdomen firm but not terse, bowel sounds not noted, nontender  Neuro nonfocal    CBG (last 3)   Basename 05/26/11 0834 05/26/11 0551 05/26/11 0002  GLUCAP 253* 282* 302*     Labs:  Lab Results  Component Value Date   WBC 4.3 05/26/2011   HGB 12.0 05/26/2011   HCT 34.7* 05/26/2011   MCV 93.8 05/26/2011   PLT 184 05/26/2011   NEUTROABS 3.6 05/26/2011     Lab 05/26/11 0410 05/25/11 0500 05/24/11 1039 05/23/11 2017 05/23/11 1423  NA 131* 132* 129* 124* 126*  K 3.8 3.5 3.2* 3.4* 3.8  CL 97 97 92* 85* 85*  CO2 23 25 25 27 29   GLUCOSE 299* 89 160* 196* 184*  BUN 17 15 20 22 21   CREATININE 0.96 0.97 1.03 1.11* 1.19*  CALCIUM 9.2 8.4 8.5 9.3 9.8  MG 1.8 1.5 1.3* -- --    Urine Studies No results found for this basename: UACOL:2,UAPR:2,USPG:2,UPH:2,UTP:2,UGL:2,UKET:2,UBIL:2,UHGB:2,UNIT:2,UROB:2,ULEU:2,UEPI:2,UWBC:2,URBC:2,UBAC:2,CAST:2,CRYS:2,UCOM:2,BILUA:2 in the last 72 hours     Studies:  Dg Abd 1 View  05/25/2011  *RADIOLOGY REPORT*  Clinical Data: Small bowel obstruction,  follow-up  ABDOMEN - 1 VIEW  Comparison: Abdomen films of 05/24/2011  Findings: There are persistently dilated small bowel loops consistent with partial small bowel obstruction.  No colonic distention is seen although some air is noted within the rectum. No opaque calculi are seen.  IMPRESSION: Persistent gaseous distention of small bowel consistent with partial small bowel obstruction.  Original Report Authenticated By: Juline Patch, M.D.   Dg Abd 2 Views  05/26/2011  *RADIOLOGY REPORT*  Clinical Data:  ABDOMEN - 2 VIEW  Comparison: None.  Findings: NG tube placement NG tube is coiled within a large hiatal hernia above the diaphragm.  There is interval decrease in size of dilated loops of small bowel which measures 3 cm compared to 4 cm on prior.  There are fluid levels in the small bowel.  There is gas within the rectosigmoid colon.  No free air on the left lateral decubitus view.  IMPRESSION:  1.  NG tube coiled within a large sliding type hiatal hernia above the diaphragm. 2.  Interval decrease in size of dilated loops of small bowel. 3.  Gas within the rectum. 4.  No free air.  Original Report Authenticated By: Genevive Bi, M.D.   Dg Abd 2 Views  05/24/2011  *RADIOLOGY REPORT*  Clinical Data: Abdominal distention.  Nasogastric tube placed.  ABDOMEN -  2 VIEW  Comparison: CT abdomen pelvis and acute abdominal series 05/23/2011 .  Findings: The lung bases and upper aspect of the abdomen are not included on these images.  Distal aspect of the nasogastric tube projects over the expected location of the proximal stomach.  Right side up decubitus view is submitted.  Evaluation for free air is limited by overexposure of the far lateral right abdomen.  No gross evidence of free air is seen.  There are air-fluid levels and multiple dilated loops of small bowel.  Small bowel dilatation measures up to least 4.5 cm.  There is stool and a small amount of gas visualized in the proximal colon.  Oral contrast  material is seen in the visualized portion of the upper urinary bladder (inferior pelvis not completely included on the image). This contrast is from CT abdomen pelvis performed yesterday.  IMPRESSION:  1.  Persistent high-grade small bowel obstruction pattern. 2.  No gross evidence of free intraperitoneal air. 3.  Nasogastric tube terminates in the proximal stomach (the stomach is not completely included on this image.  Original Report Authenticated By: Britta Mccreedy, M.D.   Dg Naso G Tube Plc Lanell Persons Blima Rich  05/24/2011  Fluoroscopically guided nasogastric tube placement.  Clinical history:  Small bowel obstruction.  Hiatal hernia.  Fluoroscopic time:  2.26 minutes.  Procedure:  After failed attempt at placing nasogastric tube without fluoroscopic guidance, fluoroscopically guided placement was requested.  Nasogastric tube was placed into the hiatal hernia and became curled within such.  This was not able to be advanced. As there was drainage of green bilious material, nasogastric tube was left in this position.  Impression:  Nasogastric tube curled within the hiatal hernia.  Original Report Authenticated By: Fuller Canada, M.D.    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 After these problems resolved she received 2 additional cycles of single-agent carboplatin completed in March 2011   (2) She had her first recurrence February 2012 treated with carboplatin and Gemzar for a total of 6 cycles between February and June 2012. Her CEA-125 had normalized before the beginning of cycle 5.   (3) now in second recurrence, s/p carboplatin given last week, doxil  given 3/20 with good tolerance  (4) SBO secondary to above, being treated conservatively; some evidence of improvement  (5) malnutrition: started TNA 3/20--elevated glc being managed with intensified SSI  Plan: The plan is to continue conservative measures until the SBO resolves. If there is a protracted course we would have to consider surgery, but we would like to avoid that if at all possible. Remaining chemotherapy cycles will be given on an outpatient basis     Xaine Sansom C 05/26/2011

## 2011-05-27 ENCOUNTER — Inpatient Hospital Stay (HOSPITAL_COMMUNITY): Payer: Medicare Other

## 2011-05-27 LAB — GLUCOSE, CAPILLARY
Glucose-Capillary: 126 mg/dL — ABNORMAL HIGH (ref 70–99)
Glucose-Capillary: 167 mg/dL — ABNORMAL HIGH (ref 70–99)
Glucose-Capillary: 138 mg/dL — ABNORMAL HIGH (ref 70–99)
Glucose-Capillary: 196 mg/dL — ABNORMAL HIGH (ref 70–99)

## 2011-05-27 LAB — COMPREHENSIVE METABOLIC PANEL
Albumin: 2.9 g/dL — ABNORMAL LOW (ref 3.5–5.2)
Alkaline Phosphatase: 84 U/L (ref 39–117)
BUN: 18 mg/dL (ref 6–23)
Potassium: 3.2 mEq/L — ABNORMAL LOW (ref 3.5–5.1)
Sodium: 136 mEq/L (ref 135–145)
Total Protein: 5.7 g/dL — ABNORMAL LOW (ref 6.0–8.3)

## 2011-05-27 LAB — CBC
HCT: 35 % — ABNORMAL LOW (ref 36.0–46.0)
Hemoglobin: 12 g/dL (ref 12.0–15.0)
MCV: 94.6 fL (ref 78.0–100.0)
RDW: 12.1 % (ref 11.5–15.5)
WBC: 4.9 10*3/uL (ref 4.0–10.5)

## 2011-05-27 MED ORDER — POTASSIUM CHLORIDE 10 MEQ/100ML IV SOLN
10.0000 meq | INTRAVENOUS | Status: DC
  Administered 2011-05-27: 10 meq via INTRAVENOUS
  Filled 2011-05-27 (×5): qty 100

## 2011-05-27 MED ORDER — INSULIN GLARGINE 100 UNIT/ML ~~LOC~~ SOLN
15.0000 [IU] | Freq: Every day | SUBCUTANEOUS | Status: DC
  Administered 2011-05-28 – 2011-05-29 (×2): 15 [IU] via SUBCUTANEOUS

## 2011-05-27 MED ORDER — TRACE MINERALS CR-CU-MN-SE-ZN 10-1000-500-60 MCG/ML IV SOLN
INTRAVENOUS | Status: AC
  Administered 2011-05-27: 18:00:00 via INTRAVENOUS
  Filled 2011-05-27: qty 2000

## 2011-05-27 MED ORDER — SODIUM CHLORIDE 0.9 % IV SOLN
10.0000 mmol | Freq: Once | INTRAVENOUS | Status: AC
  Administered 2011-05-27: 10 mmol via INTRAVENOUS
  Filled 2011-05-27: qty 3.33

## 2011-05-27 MED ORDER — INSULIN GLARGINE 100 UNIT/ML ~~LOC~~ SOLN
5.0000 [IU] | Freq: Once | SUBCUTANEOUS | Status: AC
  Administered 2011-05-27: 5 [IU] via SUBCUTANEOUS

## 2011-05-27 MED ORDER — POTASSIUM CHLORIDE 10 MEQ/50ML IV SOLN
10.0000 meq | INTRAVENOUS | Status: AC
  Administered 2011-05-27 (×5): 10 meq via INTRAVENOUS
  Filled 2011-05-27 (×5): qty 50

## 2011-05-27 MED ORDER — SODIUM CHLORIDE 0.45 % IV SOLN
INTRAVENOUS | Status: DC
  Administered 2011-05-27: 11:00:00 via INTRAVENOUS

## 2011-05-27 MED ORDER — FAT EMULSION 20 % IV EMUL
240.0000 mL | INTRAVENOUS | Status: AC
  Administered 2011-05-27: 240 mL via INTRAVENOUS
  Filled 2011-05-27: qty 250

## 2011-05-27 MED ORDER — KETOROLAC TROMETHAMINE 15 MG/ML IJ SOLN
15.0000 mg | Freq: Three times a day (TID) | INTRAMUSCULAR | Status: DC
  Administered 2011-05-27 – 2011-05-31 (×12): 15 mg via INTRAVENOUS
  Filled 2011-05-27 (×18): qty 1

## 2011-05-27 MED ORDER — POTASSIUM CHLORIDE IN NACL 20-0.9 MEQ/L-% IV SOLN
INTRAVENOUS | Status: DC
  Filled 2011-05-27: qty 1000

## 2011-05-27 NOTE — Progress Notes (Signed)
Nutrition Brief Note  - Noted events of last night. Noted pt without nausea/vomitnig after NGT removed however no flatus either. Pt currently in radiology having NGT replaced. NGT put out before removal yesterday.   TNA: Clinimix E 5/20 @ 40 ml/hr. Lipids (20% IVFE @ 10 ml/hr), multivitamins, and trace elements are provided 3 times weekly (MWF) due to national backorder. Provides 1051 kcal and 48 grams protein daily (based on weekly average). Meets 50% minimum estimated kcal and 56% minimum estimated protein needs.  Additional IVF with 0.45% NS @ 10-20 ml/hr.  Potassium - low, dropped from 3.8 on 3/20 to 3.2 on 3/21, pharmacist ordered KCl IV x 5  Phosphorus - low, improved from 1.8 on 3/20 to 2.1 on 3/21, pharmacist ordered repeated supplementation of Na Phos IV   Magnesium - WNL, 1.8 on 3/20  CBG (last 3)   Basename 05/27/11 1203 05/27/11 1122 05/27/11 0801  GLUCAP 185* 178* 120*   - Pharmacy ordered to continue SSI to Moderate Scale q4h; Increase Lantus 15 units daily.  Pharmacy advancing TNA slowly r/t pt's risk of refeeding syndrome which pt has been showing signs of. RD to monitor for TNA advancement.   Dietitian # [phone]

## 2011-05-27 NOTE — Progress Notes (Signed)
RN  And NT were in pt room to help ambulate her in the hallway as ordered this PM. Pt stated she was not too well and was  Emotional did not want to walk tonight.

## 2011-05-27 NOTE — Progress Notes (Signed)
Not "ready" to walk or get oob to chair this morning. Still no gas. No c/o nausea. Will monitor.

## 2011-05-27 NOTE — Evaluation (Signed)
Physical Therapy Evaluation Patient Details Name: Regina West MRN: [id number] DOB: 28-Mar-1929 Today's Date: 05/27/2011  Problem List:  Patient Active Problem List  Diagnoses  . Ovarian cancer  . Small bowel obstruction  . Seizure disorder  . Hyponatremia    Past Medical History:  Past Medical History  Diagnosis Date  . Cancer     ovarian, peritoneal serous adenocarcin  . Hypertension   . Hyperlipidemia   . Seizure disorder   . Hiatal hernia   . Increased glucose level    Past Surgical History:  Past Surgical History  Procedure Date  . Abdominal surgery   . Power port placement   . Abdominal hysterectomy     omentectomy, b/l SPO. at [facility]  . Appendectomy   . Cataract extraction, bilateral     PT Assessment/Plan/Recommendation PT Assessment Clinical Impression Statement: Patient admitted with SBO and n/o ovarian cancer s/p debulking in  Sept 2010 just s/p replacement of NG tube with difficulty and throat pain presents with decreased independence and safety with mobility due to decreased strength, limited activity tolerance and will benefit from skilled PT to maximize independence and safety with mobility to allow d/c home to safest, but least restrictive environment with HHPT. PT Recommendation/Assessment: Patient will need skilled PT in the acute care venue PT Problem List: Decreased strength;Decreased activity tolerance;Decreased mobility Barriers to Discharge: Decreased caregiver support PT Therapy Diagnosis : Generalized weakness;Abnormality of gait PT Plan PT Frequency: Min 3X/week PT Treatment/Interventions: DME instruction;Gait training;Stair training;Functional mobility training;Therapeutic activities;Therapeutic exercise;Balance training;Patient/family education PT Recommendation Follow Up Recommendations: Home health PT;Supervision - Intermittent PT Goals  Acute Rehab PT Goals PT Goal Formulation: With patient Time For Goal Achievement: 2 weeks Pt will  go Supine/Side to Sit: with modified independence PT Goal: Supine/Side to Sit - Progress: Goal set today Pt will go Sit to Stand: with modified independence PT Goal: Sit to Stand - Progress: Goal set today Pt will go Stand to Sit: with modified independence PT Goal: Stand to Sit - Progress: Goal set today Pt will Ambulate: >150 feet;with modified independence;with least restrictive assistive device PT Goal: Ambulate - Progress: Goal set today Pt will Perform Home Exercise Program: Independently PT Goal: Perform Home Exercise Program - Progress: Goal set today  PT Evaluation Precautions/Restrictions  Precautions Precautions: Fall Precaution Comments: NG tube Prior Functioning  Home Living Lives With: Alone Type of Home: House Home Layout: One level Home Access: Stairs to enter [employer] of Steps: 1 Additional Comments: Also has independent apartment at [employer].  Looking into getting into assisted living there Prior Function Level of Independence: Independent with basic ADLs;Independent with gait Cognition Cognition Arousal/Alertness: Awake/alert Overall Cognitive Status: Appears within functional limits for tasks assessed Sensation/Coordination Sensation Additional Comments: reports some neuropathy in feet Extremity Assessment RLE Assessment RLE Assessment: Within Functional Limits LLE Assessment LLE Assessment: Within Functional Limits Mobility (including Balance) Bed Mobility Bed Mobility: Yes Supine to Sit: 5: Supervision Supine to Sit Details (indicate cue type and reason): for assist with tubing and IV lines in both forearms Sit to Supine: 5: Supervision Sit to Supine - Details (indicate cue type and reason): increased time  to lift legs into bed Transfers Transfers: Yes Sit to Stand: 5: Supervision Sit to Stand Details (indicate cue type and reason): cues for safety Stand to Sit: 5: Supervision Stand to Sit Details: cues for positioning near  [facility] Ambulation/Gait Ambulation/Gait: Yes Ambulation/Gait Assistance: 5: Supervision;4: Min assist Ambulation/Gait Assistance Details (indicate cue type and reason): occasional assist due  to walker veering to right Ambulation Distance (Feet): 400 Feet Assistive device: Rolling walker Gait Pattern: Step-through pattern;Decreased stride length Gait velocity: slow pace with 2 people following with IV poles    Exercise    End of Session PT - End of Session Equipment Utilized During Treatment: Gait belt Activity Tolerance: Patient limited by pain Patient left: in bed;with family/visitor present General Behavior During Session: [facility] for tasks performed Cognition: [facility] for tasks performed  [facility] 05/27/2011, 4:44 PM

## 2011-05-27 NOTE — Progress Notes (Addendum)
Patient found coughing with NGT OUT! Only about 3 cm in nose. NGT removed. Patient tearful and anxious. "Hate it came out, but glad it did. It had been bothering me all day." Patient coughing up lots of thick whitish phelgm. Told patient would leave out until discuss with MD in am. Had to be put down under fluoro before. Patient does not want to go through it again "unless have to". Patient refused to ambulate any more tonight. "TIRED". Will try again in am.

## 2011-05-27 NOTE — Progress Notes (Signed)
MEENA BARRANTES   DOB:[birth year]   ZO#:[id number]   JWJ#:[id number]  Subjective: she is in pain this morning because of the K infusion in the left arm; we stopped that and flushed the line which resolved the issue, but she remains hypokalemic and we have asked pharmacy to correct through TNA; not passing gas or stool; ng came out last night, her throat feels a lot better and she slept more comfortably, no nausea or vomiting at this point; ambulated only minimally yesterday, "I was too tired"; no family in room  Objective: Elderly white female examined in bed Filed Vitals:   05/27/11 0433  BP: 134/70  Pulse: 85  Temp: 97.8 F (36.6 C)  Resp: 17    Body mass index is 25.18 kg/(m^2).  Intake/Output Summary (Last 24 hours) at 05/27/11 0900 Last data filed at 05/27/11 0649  Gross per 24 hour  Intake 2353.22 ml  Output   1550 ml  Net 803.22 ml     Sclerae unicteric  Oropharynx clear; ng out  No peripheral adenopathy  Lungs auscultated anteriorly -- no rales or rhonchi  Heart regular rate and rhythm  Abdomen firm but not terse, bowel sounds not noted, nontender  Neuro nonfocal    CBG (last 3)   Basename 05/27/11 0801 05/27/11 0350 05/26/11 2349  GLUCAP 120* 167* 157*     Labs:  Lab Results  Component Value Date   WBC 4.9 05/27/2011   HGB 12.0 05/27/2011   HCT 35.0* 05/27/2011   MCV 94.6 05/27/2011   PLT 178 05/27/2011   NEUTROABS 3.6 05/26/2011     Lab 05/27/11 0402 05/26/11 0410 05/25/11 0500 05/24/11 1039 05/23/11 2017  NA 136 131* 132* 129* 124*  K 3.2* 3.8 3.5 3.2* 3.4*  CL 101 97 97 92* 85*  CO2 24 23 25 25 27   GLUCOSE 157* 299* 89 160* 196*  BUN 18 17 15 20 22   CREATININE 0.90 0.96 0.97 1.03 1.11*  CALCIUM 9.1 9.2 8.4 8.5 9.3  MG -- 1.8 1.5 1.3* --    Urine Studies No results found for this basename: UACOL:2,UAPR:2,USPG:2,UPH:2,UTP:2,UGL:2,UKET:2,UBIL:2,UHGB:2,UNIT:2,UROB:2,ULEU:2,UEPI:2,UWBC:2,URBC:2,UBAC:2,CAST:2,CRYS:2,UCOM:2,BILUA:2 in the last 72  hours     Studies:  Dg Abd 1 View  05/27/2011  *RADIOLOGY REPORT*  Clinical Data: Small bowel obstruction.  ABDOMEN - 1 VIEW  Comparison: 05/26/2011 11/08/2011  Findings: Single portable view of the abdomen was obtained.  Again noted are dilated loops of small bowel in the left abdomen. Limited evaluation for free air on this examination.  Evidence for stool and some gas within the colon. A nasogastric tube is not seen within the abdomen.  Small bowel loops measure up to 5.4 cm, suggesting that there may be increased small bowel distention.  IMPRESSION: Persistent dilatation of small bowel loops, predominately along the left side of the abdomen.  There is concern for increased small bowel distention.  Limited evaluation for free air on this examination.  Original Report Authenticated By: Richarda Overlie, M.D.   Dg Abd 2 Views  05/26/2011  *RADIOLOGY REPORT*  Clinical Data:  ABDOMEN - 2 VIEW  Comparison: None.  Findings: NG tube placement NG tube is coiled within a large hiatal hernia above the diaphragm.  There is interval decrease in size of dilated loops of small bowel which measures 3 cm compared to 4 cm on prior.  There are fluid levels in the small bowel.  There is gas within the rectosigmoid colon.  No free air on the left lateral decubitus view.  IMPRESSION:  1.  NG tube coiled within a large sliding type hiatal hernia above the diaphragm. 2.  Interval decrease in size of dilated loops of small bowel. 3.  Gas within the rectum. 4.  No free air.  Original Report Authenticated By: Genevive Bi, M.D.    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 After  these problems resolved she received 2 additional cycles of single-agent carboplatin completed in March 2011   (2) She had her first recurrence February 2012 treated with carboplatin and Gemzar for a total of 6 cycles between February and June 2012. Her CEA-125 had normalized before the beginning of cycle 5.   (3) now in second recurrence, s/p carboplatin given last week, doxil given 3/20 with good tolerance  (4) SBO secondary to above, being treated conservatively; some evidence of improvement  (5) malnutrition: started TNA 3/20--elevated glc being managed with intensified SSI  (6) pain secondary to K infusion, also to SBO  Plan: She will need to have the ng replaced and I have discussed that w her and written the orders; she needs to help herself more by getting OOBTC and ambulating--have placed PT eval to help [place] mobilize her; Did not tolerate IV K, have asked pharmacy to correct through [facility] changed pain meds away from narcotics to toradol, but she is not requiring much pain medication; counts remain fina, no side effects from chemotherapy so far; overall plan is for continuing conservative treatment of her SBO, with TNA and other symptomatic support. Full code.    Annelies Coyt C 05/27/2011

## 2011-05-27 NOTE — Progress Notes (Signed)
Patient ID: Regina West, female   DOB: 1929-08-18, 78 y.o.   MRN: [id number]    Subjective: NGT came out last night about 9 pm. No N/V since then but no flatus either.  ABD film this am with continued SBO which might be slightly worse today.  She is going down to radiology to have her NGT replaced as before.   Objective: Vital signs in last 24 hours: Temp:  [97.8 F (36.6 C)-99.1 F (37.3 C)] 97.8 F (36.6 C) (03/22 0433) Pulse Rate:  [80-94] 85  (03/22 0433) Resp:  [16-17] 17  (03/22 0433) BP: (134-161)/(70-89) 134/70 mmHg (03/22 0433) SpO2:  [95 %-97 %] 96 % (03/22 0433) Last BM Date: 05/19/11 (very small bm 3/20)  Intake/Output from previous day: 03/21 0701 - 03/22 0700 In: 2353.2 [I.V.:1046.7; IV Piggyback:102; TPN:1164.6] Out: 1650 [Urine:1050; Emesis/NG output:600] Intake/Output this shift: Total I/O In: -  Out: 200 [Urine:200]  General appearance: alert, appears stated age and no distress Resp: clear to auscultation bilaterally GI:+ rare BS, soft, minimal distention.  Lab Results:   Basename 05/27/11 0402 05/26/11 0410  WBC 4.9 4.3  HGB 12.0 12.0  HCT 35.0* 34.7*  PLT 178 184    BMET  Basename 05/27/11 0402 05/26/11 0410  NA 136 131*  K 3.2* 3.8  CL 101 97  CO2 24 23  GLUCOSE 157* 299*  BUN 18 17  CREATININE 0.90 0.96  CALCIUM 9.1 9.2   PT/INR No results found for this basename: LABPROT:2,INR:2 in the last 72 hours   Lab 05/27/11 0402 05/26/11 0410 05/25/11 0500 05/23/11 2017 05/23/11 1423  AST 17 17 19 24 24   ALT 15 17 17 24 27   ALKPHOS 84 98 82 108 116  BILITOT 0.3 0.4 0.4 0.5 0.6  PROT 5.7* 6.1 5.4* 6.8 7.1  ALBUMIN 2.9* 3.0* 2.9* 3.7 3.9     Lipase     Component Value Date/Time   LIPASE 35 05/23/2011 2017     Studies/Results: Dg Abd 1 View  05/27/2011  *RADIOLOGY REPORT*  Clinical Data: Small bowel obstruction.  ABDOMEN - 1 VIEW  Comparison: 05/26/2011 11/08/2011  Findings: Single portable view of the abdomen was obtained.   Again noted are dilated loops of small bowel in the left abdomen. Limited evaluation for free air on this examination.  Evidence for stool and some gas within the colon. A nasogastric tube is not seen within the abdomen.  Small bowel loops measure up to 5.4 cm, suggesting that there may be increased small bowel distention.  IMPRESSION: Persistent dilatation of small bowel loops, predominately along the left side of the abdomen.  There is concern for increased small bowel distention.  Limited evaluation for free air on this examination.  Original Report Authenticated By: Richarda Overlie, M.D.   Dg Abd 2 Views  05/26/2011  *RADIOLOGY REPORT*  Clinical Data:  ABDOMEN - 2 VIEW  Comparison: None.  Findings: NG tube placement NG tube is coiled within a large hiatal hernia above the diaphragm.  There is interval decrease in size of dilated loops of small bowel which measures 3 cm compared to 4 cm on prior.  There are fluid levels in the small bowel.  There is gas within the rectosigmoid colon.  No free air on the left lateral decubitus view.  IMPRESSION:  1.  NG tube coiled within a large sliding type hiatal hernia above the diaphragm. 2.  Interval decrease in size of dilated loops of small bowel. 3.  Gas within the rectum.  4.  No free air.  Original Report Authenticated By: Genevive Bi, M.D.    Medications:    . antiseptic oral rinse  15 mL Mouth Rinse BID  . insulin aspart  0-15 Units Subcutaneous Q4H  . insulin glargine  15 Units Subcutaneous Daily  . insulin glargine  5 Units Subcutaneous Once  . ketorolac  15 mg Intravenous Q8H  . levetiracetam  250 mg Intravenous Q12H  . pantoprazole (PROTONIX) IV  40 mg Intravenous Q24H  . potassium chloride  10 mEq Intravenous Q1 Hr x 5  . sodium phosphate  Dextrose 5% IVPB  10 mmol Intravenous Once  . sodium phosphate  Dextrose 5% IVPB  10 mmol Intravenous Once  . DISCONTD: insulin glargine  10 Units Subcutaneous Daily  . DISCONTD: potassium chloride  10 mEq  Intravenous Q1 Hr x 5    Assessment/Plan 1.  Small bowel obstruction  2.  Ovarian cancer  3.  Nausea and vomiting  4.  Dehydration  5.  Hyponatremia  6.  Serum chloride decreased    Plan:   Continue conservative RX with NGT  Continues TNA for support and chemo per oncology.    LOS: 4 days    Milca Sytsma,PA-C Pager [phone] General Trauma Pager [phone]

## 2011-05-27 NOTE — Progress Notes (Signed)
PARENTERAL NUTRITION CONSULT NOTE   Pharmacy Consult for TNA Indication: SBO, malnutrition  Allergies  Allergen Reactions  . Avastin (Bevacizumab) Other (See Comments)    Swelling of the brain     Patient Measurements: Height: 5\' 6"  (167.6 cm) Weight: 156 lb (70.761 kg) IBW/kg (Calculated) : 59.3  Wt: 70.8 kg, Ht: 66 in  Vital Signs: Temp: 97.8 F (36.6 C) (03/22 0433) Temp src: Oral (03/22 0433) BP: 134/70 mmHg (03/22 0433) Pulse Rate: 85  (03/22 0433) Intake/Output from previous day: 03/21 0701 - 03/22 0700 In: 1630.9 [I.V.:630; IV Piggyback:102; TPN:874] Out: 1650 [Urine:1050; Emesis/NG output:600] Intake/Output from this shift:    Labs:  Basename 05/27/11 0402 05/26/11 0410 05/25/11 0500  WBC 4.9 4.3 2.5*  HGB 12.0 12.0 11.3*  HCT 35.0* 34.7* 33.1*  PLT 178 184 187  APTT -- -- --  INR -- -- --     Basename 05/27/11 0402 05/26/11 0410 05/25/11 0500 05/24/11 1039  NA 136 131* 132* --  K 3.2* 3.8 3.5 --  CL 101 97 97 --  CO2 24 23 25  --  GLUCOSE 157* 299* 89 --  BUN 18 17 15  --  CREATININE 0.90 0.96 0.97 --  LABCREA -- -- -- --  CREAT24HRUR -- -- -- --  CALCIUM 9.1 9.2 8.4 --  MG -- 1.8 1.5 1.3*  PHOS 2.1* 1.8* -- --  PROT 5.7* 6.1 5.4* --  ALBUMIN 2.9* 3.0* 2.9* --  AST 17 17 19  --  ALT 15 17 17  --  ALKPHOS 84 98 82 --  BILITOT 0.3 0.4 0.4 --  BILIDIR -- -- -- --  IBILI -- -- -- --  PREALBUMIN -- 9.0* -- --  TRIG -- 120 -- --  CHOLHDL -- -- -- --  CHOL -- 150 -- --   Estimated Creatinine Clearance: 47.4 ml/min (by C-G formula based on Cr of 0.9).    Basename 05/27/11 0350 05/26/11 2349 05/26/11 1646  GLUCAP 167* 157* 162*    Medical History: Past Medical History  Diagnosis Date  . Cancer     ovarian, peritoneal serous adenocarcin  . Hypertension   . Hyperlipidemia   . Seizure disorder   . Hiatal hernia   . Increased glucose level     Medications:  Scheduled:     . antiseptic oral rinse  15 mL Mouth Rinse BID  . insulin  aspart  0-15 Units Subcutaneous Q4H  . insulin glargine  10 Units Subcutaneous Daily  . levetiracetam  250 mg Intravenous Q12H  . pantoprazole (PROTONIX) IV  40 mg Intravenous Q24H  . potassium chloride  10 mEq Intravenous Q1 Hr x 5  . sodium phosphate  Dextrose 5% IVPB  10 mmol Intravenous Once  . sodium phosphate  Dextrose 5% IVPB  10 mmol Intravenous Once  . DISCONTD: insulin aspart  0-9 Units Subcutaneous Q6H   Infusions:     . 0.9 % NaCl with KCl 20 mEq / L 1,000 mL (05/26/11 2331)  . TPN (CLINIMIX) +/- additives 40 mL/hr at 05/25/11 1847   And  . fat emulsion 250 mL (05/25/11 1848)  . TPN (CLINIMIX) +/- additives 40 mL/hr at 05/26/11 1826    Insulin Requirements in the past 24 hours:  22 units SSI,Lantus 10 units daily  Current Nutrition:  Note that patient has not eaten x 2 weeks PTA. Pt is NPO. Current IVF: NaCl with 20 mEq KCL at 50 ml/hr  Assessment: 78 yo female admitted with SBO and extended period of not  eating. Started TNA for nutrition 3/20. Note that patient is at risk of refeeding syndrome due to prolonged malnutrition and will advance rate of TNA slowly as a result.   CBGs improved with increase to moderate scale SSI and added Lantus 10 units daily; still above goal of 150  Phos up but still low - received Na Phos IV yesterday  K low  Na wnl now (was low)  Lipids wnl 3/21  PAB 9 3/21  Nutritional Goals:  RD recommendations:  kCal 2100-2500  Protein 85-105g Fluid 2.1-2.5L  Clinimix E 5/20 at a goal rate of 80 ml/hr to provide: 96g protein daily, 2169 kcal/day on days with Lipids, 1689 kcal/day on days without Lipids, Avg 1896 kcal/day/week   Plan:   Repeat Phos supplementation with Na Phos IV  KCL IV x 5  Continue SSI to Moderate Scale q4h; Increase Lantus 15 units daily  Increase Clinimix E 5/20 at 50 ml/hr. Will advance rate slowly due to increased risk of refeeding syndrome  AM BMET and Phos and Mg  Decrease   MIVF's to 40 ml/hr  Lipid at 10 ml/hr on MWF due to national shortage  MVI and trace elements on MWF only also due to national shortage  Gwen Her PharmD  [phone] 05/27/2011 7:43 AM

## 2011-05-27 NOTE — Progress Notes (Addendum)
Paged Dr Darnelle Catalan to let him know that NGT is OUT!. Patient has not been nauseated, no gas, no abdominal pain, abdomen remains sl distended. Few Bowel sounds auscultated. Abdomen xray pending. No new orders at present.

## 2011-05-28 ENCOUNTER — Inpatient Hospital Stay (HOSPITAL_COMMUNITY): Payer: Medicare Other

## 2011-05-28 DIAGNOSIS — R05 Cough: Secondary | ICD-10-CM

## 2011-05-28 DIAGNOSIS — E46 Unspecified protein-calorie malnutrition: Secondary | ICD-10-CM

## 2011-05-28 LAB — BASIC METABOLIC PANEL
CO2: 28 mEq/L (ref 19–32)
Chloride: 95 mEq/L — ABNORMAL LOW (ref 96–112)
Creatinine, Ser: 0.92 mg/dL (ref 0.50–1.10)
Glucose, Bld: 208 mg/dL — ABNORMAL HIGH (ref 70–99)

## 2011-05-28 LAB — GLUCOSE, CAPILLARY
Glucose-Capillary: 246 mg/dL — ABNORMAL HIGH (ref 70–99)
Glucose-Capillary: 237 mg/dL — ABNORMAL HIGH (ref 70–99)
Glucose-Capillary: 169 mg/dL — ABNORMAL HIGH (ref 70–99)
Glucose-Capillary: 144 mg/dL — ABNORMAL HIGH (ref 70–99)

## 2011-05-28 LAB — MAGNESIUM: Magnesium: 1.4 mg/dL — ABNORMAL LOW (ref 1.5–2.5)

## 2011-05-28 MED ORDER — BISACODYL 10 MG RE SUPP
10.0000 mg | Freq: Once | RECTAL | Status: AC
  Administered 2011-05-28: 10 mg via RECTAL
  Filled 2011-05-28: qty 1

## 2011-05-28 MED ORDER — INSULIN ASPART 100 UNIT/ML ~~LOC~~ SOLN
0.0000 [IU] | SUBCUTANEOUS | Status: DC
  Administered 2011-05-28: 7 [IU] via SUBCUTANEOUS
  Administered 2011-05-28: 3 [IU] via SUBCUTANEOUS
  Administered 2011-05-28 – 2011-05-29 (×2): 4 [IU] via SUBCUTANEOUS
  Administered 2011-05-29 (×2): 3 [IU] via SUBCUTANEOUS
  Administered 2011-05-29: 4 [IU] via SUBCUTANEOUS
  Administered 2011-05-29: 3 [IU] via SUBCUTANEOUS
  Administered 2011-05-29: 4 [IU] via SUBCUTANEOUS
  Administered 2011-05-30: 3 [IU] via SUBCUTANEOUS
  Administered 2011-05-30 (×2): 4 [IU] via SUBCUTANEOUS
  Administered 2011-05-30: 3 [IU] via SUBCUTANEOUS
  Administered 2011-05-30 (×2): 4 [IU] via SUBCUTANEOUS
  Administered 2011-05-31: 3 [IU] via SUBCUTANEOUS
  Administered 2011-05-31 (×2): 4 [IU] via SUBCUTANEOUS
  Administered 2011-05-31: 3 [IU] via SUBCUTANEOUS
  Administered 2011-05-31: 4 [IU] via SUBCUTANEOUS
  Administered 2011-05-31: 11 [IU] via SUBCUTANEOUS
  Administered 2011-06-01: 3 [IU] via SUBCUTANEOUS
  Administered 2011-06-01: 4 [IU] via SUBCUTANEOUS
  Administered 2011-06-01: 3 [IU] via SUBCUTANEOUS

## 2011-05-28 MED ORDER — POTASSIUM CHLORIDE 10 MEQ/50ML IV SOLN
10.0000 meq | INTRAVENOUS | Status: AC
  Administered 2011-05-28 (×3): 10 meq via INTRAVENOUS
  Filled 2011-05-28 (×3): qty 50

## 2011-05-28 MED ORDER — CLINIMIX E/DEXTROSE (5/20) 5 % IV SOLN
INTRAVENOUS | Status: AC
  Administered 2011-05-28: 18:00:00 via INTRAVENOUS
  Filled 2011-05-28: qty 1200

## 2011-05-28 NOTE — Progress Notes (Signed)
Subjective: No flatus or BM.  Feels better with ng in.  Objective: Vital signs in last 24 hours: Temp:  [98.1 F (36.7 C)-98.8 F (37.1 C)] 98.1 F (36.7 C) (03/23 0524) Pulse Rate:  [88-108] 108  (03/23 0524) Resp:  [16-18] 18  (03/23 0524) BP: (145-172)/(85-99) 172/99 mmHg (03/23 0524) SpO2:  [93 %-95 %] 95 % (03/23 0524) Last BM Date: 05/19/11 (very small bm 3/20)  Intake/Output from previous day: 03/22 0701 - 03/23 0700 In: 0  Out: 1600 [Urine:900; Emesis/NG output:700] Intake/Output this shift: Total I/O In: -  Out: 120 [Urine:120]  PE: Abd-soft, nontender, few BS present  Lab Results:   Basename 05/27/11 0402 05/26/11 0410  WBC 4.9 4.3  HGB 12.0 12.0  HCT 35.0* 34.7*  PLT 178 184   BMET  Basename 05/28/11 0650 05/27/11 0402  NA 133* 136  K 3.9 3.2*  CL 95* 101  CO2 28 24  GLUCOSE 208* 157*  BUN 21 18  CREATININE 0.92 0.90  CALCIUM 9.3 9.1   PT/INR No results found for this basename: LABPROT:2,INR:2 in the last 72 hours Comprehensive Metabolic Panel:    Component Value Date/Time   NA 133* 05/28/2011 0650   NA 143 05/12/2011 1337   K 3.9 05/28/2011 0650   K 4.1 05/12/2011 1337   CL 95* 05/28/2011 0650   CL 92* 05/12/2011 1337   CO2 28 05/28/2011 0650   CO2 32 05/12/2011 1337   BUN 21 05/28/2011 0650   BUN 19 05/12/2011 1337   CREATININE 0.92 05/28/2011 0650   CREATININE 1.3* 05/12/2011 1337   GLUCOSE 208* 05/28/2011 0650   GLUCOSE 173* 05/12/2011 1337   CALCIUM 9.3 05/28/2011 0650   CALCIUM 9.8 05/12/2011 1337   AST 17 05/27/2011 0402   AST 40* 05/12/2011 1337   ALT 15 05/27/2011 0402   ALKPHOS 84 05/27/2011 0402   ALKPHOS 95* 05/12/2011 1337   BILITOT 0.3 05/27/2011 0402   BILITOT 0.60 05/12/2011 1337   PROT 5.7* 05/27/2011 0402   PROT 6.2* 05/12/2011 1337   ALBUMIN 2.9* 05/27/2011 0402     Studies/Results: Dg Abd 1 View  05/27/2011  *RADIOLOGY REPORT*  Clinical Data: Small bowel obstruction.  ABDOMEN - 1 VIEW  Comparison: 05/26/2011 11/08/2011  Findings: Single  portable view of the abdomen was obtained.  Again noted are dilated loops of small bowel in the left abdomen. Limited evaluation for free air on this examination.  Evidence for stool and some gas within the colon. A nasogastric tube is not seen within the abdomen.  Small bowel loops measure up to 5.4 cm, suggesting that there may be increased small bowel distention.  IMPRESSION: Persistent dilatation of small bowel loops, predominately along the left side of the abdomen.  There is concern for increased small bowel distention.  Limited evaluation for free air on this examination.  Original Report Authenticated By: Richarda Overlie, M.D.   Dg Naso G Tube Plc W/fl W/rad  05/27/2011  Fluoroscopically guided nasogastric tube placement.  Clinical history:  Small bowel obstruction.  Hiatal hernia.  Fluoroscopic time 2.16 minutes.  Procedure:  18-French nasogastric tube was placed under fluoroscopy.  The patient has a hiatal hernia.  The tip of the tube was left within the stomach above the hemidiaphragm on the left. Large volume of fluid emanated from the tube after placement.  Impression:  Successful NG tube placement with the tip left within the patient's hiatal hernia.  Original Report Authenticated By: Bernadene Bell. Maricela Curet, M.D.    Anti-infectives:  Anti-infectives    None      Assessment Principal Problem:  *Small bowel obstruction-secondary to recurrent malignancy; still with some dilated small bowel loops in LUQ, contrast is in right colon; air in rectum Active Problems:  Ovarian cancer  Seizure disorder  Hyponatremia    LOS: 5 days   Plan: Dulcolax suppository.  Continue current management with the hope that this will resolve with the chemo and decompression.  Will re check Monday.   Marqueta Pulley Shela Commons 05/28/2011

## 2011-05-28 NOTE — Progress Notes (Signed)
PARENTERAL NUTRITION CONSULT NOTE   Pharmacy Consult for TNA Indication: SBO, malnutrition  Allergies  Allergen Reactions  . Avastin (Bevacizumab) Other (See Comments)    Swelling of the brain    Patient Measurements: Height: 5\' 6"  (167.6 cm) Weight: 156 lb (70.761 kg) IBW/kg (Calculated) : 59.3  Wt: 70.8 kg, Ht: 66 in  Vital Signs: Temp: 98.1 F (36.7 C) (03/23 0524) Temp src: Oral (03/23 0524) BP: 172/99 mmHg (03/23 0524) Pulse Rate: 108  (03/23 0524) Intake/Output from previous day: 03/22 0701 - 03/23 0700 In: 0  Out: 1600 [Urine:900; Emesis/NG output:700] Intake/Output from this shift:    Labs:  [facility] 05/27/11 0402 05/26/11 0410  WBC 4.9 4.3  HGB 12.0 12.0  HCT 35.0* 34.7*  PLT 178 184  APTT -- --  INR -- --     Basename 05/28/11 0650 05/27/11 0402 05/26/11 0410  NA 133* 136 131*  K 3.9 3.2* 3.8  CL 95* 101 97  CO2 28 24 23   GLUCOSE 208* 157* 299*  BUN 21 18 17   CREATININE 0.92 0.90 0.96  LABCREA -- -- --  CREAT24HRUR -- -- --  CALCIUM 9.3 9.1 9.2  MG 1.4* -- 1.8  PHOS 2.2* 2.1* 1.8*  PROT -- 5.7* 6.1  ALBUMIN -- 2.9* 3.0*  AST -- 17 17  ALT -- 15 17  ALKPHOS -- 84 98  BILITOT -- 0.3 0.4  BILIDIR -- -- --  IBILI -- -- --  PREALBUMIN -- -- 9.0*  TRIG -- -- 120  CHOLHDL -- -- --  CHOL -- -- 150   Estimated Creatinine Clearance: 46.4 ml/min (by C-G formula based on Cr of 0.92).    Basename 05/28/11 0749 05/28/11 0348 05/27/11 2331  GLUCAP 246* 206* 179*    Medical History: Past Medical History  Diagnosis Date  . Cancer     ovarian, peritoneal serous adenocarcin  . Hypertension   . Hyperlipidemia   . Seizure disorder   . Hiatal hernia   . Increased glucose level    Medications:  Scheduled:     . antiseptic oral rinse  15 mL Mouth Rinse BID  . insulin aspart  0-15 Units Subcutaneous Q4H  . insulin glargine  15 Units Subcutaneous Daily  . insulin glargine  5 Units Subcutaneous Once  . ketorolac  15 mg Intravenous Q8H  .  levetiracetam  250 mg Intravenous Q12H  . pantoprazole (PROTONIX) IV  40 mg Intravenous Q24H  . potassium chloride  10 mEq Intravenous Q1 Hr x 5  . sodium phosphate  Dextrose 5% IVPB  10 mmol Intravenous Once  . DISCONTD: insulin glargine  10 Units Subcutaneous Daily  . DISCONTD: potassium chloride  10 mEq Intravenous Q1 Hr x 5   Infusions:     . sodium chloride 10 mL/hr at 05/27/11 1059  . fat emulsion 240 mL (05/27/11 1756)  . TPN (CLINIMIX) +/- additives 40 mL/hr at 05/26/11 1826  . TPN (CLINIMIX) +/- additives 50 mL/hr at 05/27/11 1756  . DISCONTD: 0.9 % NaCl with KCl 20 mEq / L 1,000 mL (05/26/11 2331)  . DISCONTD: 0.9 % NaCl with KCl 20 mEq / L      Insulin Requirements in the past 24 hours:  22 units SSI, Lantus 15 units daily  Current Nutrition:  Note that patient had not eaten x 2 weeks PTA. Pt is NPO, NG replaced. Current IVF: 1/2 NS at [facility].  Assessment: 78 yo female admitted with SBO and extended period of not eating. Started  TNA for nutrition 3/20. Patient at risk of refeeding syndrome so have been advancing rate of TNA slowly.  CBGs elevated a bit with TNA rate advancement. Goal is to keep <150. Lantus increased yesterday. SSI currently  moderate scale q4h.  Na remains low.  Phos improved but still slightly below normal.   K improved to wnl.  Mg below normal range.  Lipids wnl 3/21  PAB 9 3/21  Nutritional Goals:  RD recommendations:  kCal 2100-2500  Protein 85-105g Fluid 2.1-2.5L  Clinimix E 5/20 at a goal rate of 80 ml/hr + lipids at 50ml/hr MWF will provide: 96g protein daily, 2169 kcal/day on days with Lipids, 1689 kcal/day on days without Lipids, Avg 1896 kcal/day/week  Plan:   Cont Clinimix E 5/20 at 50 ml/hr. Will likely advance 3/24.  Increase SSI to resistant scale. Cont CBGs q4h and Lantus 15 units daily.  NOTE: Unable to increase potassium content of premixed TNA and IVF only running at [facility] wouldn't deliver much so will give 3 more KCL  runs today via central line.  Lipids at 10 ml/hr on MWF due to national shortage.  MVI and trace elements on MWF only also due to national shortage.  BMET, Phos, Mg in am.  Charolotte Eke, PharmD, pager [phone]. 05/28/2011,8:36 AM.

## 2011-05-28 NOTE — Progress Notes (Signed)
Pt daughter -in-law came, pt stated her pain was better and was ready to take walk with daughter in law and NT assisted pt. Pt tolerated well.

## 2011-05-28 NOTE — Consult Note (Signed)
[facility] INPATIENT PROGRESS NOTE  Name: Regina West      MRN: [id number]    Location: 1312/1312-01  Date: 05/28/2011 Time:10:48 AM   Subjective: Interval History:Regina West c/o that she has been having productive cough of yellow sputum since yesterday.  She denies fever, SOB, chest pain, hemoptysis.  She has had flatus, but still no bowel movement.  She still has NGT suction.  She denies nausea/vomiting, abdominal pain, visible source of bleeding, PND, orthopnea, pedal edema, skin rash.   Objective: Vital signs in last 24 hours: Temp:  [98.1 F (36.7 C)-98.8 F (37.1 C)] 98.1 F (36.7 C) (03/23 0524) Pulse Rate:  [88-108] 108  (03/23 0524) Resp:  [16-18] 18  (03/23 0524) BP: (145-172)/(85-99) 172/99 mmHg (03/23 0524) SpO2:  [93 %-95 %] 95 % (03/23 0524)    Intake/Output from previous day: 03/22 0701 - 03/23 0700 In: 0  Out: 1600 [Urine:900]    PHYSICAL EXAM:  Gen: tired appearing woman,  in no acute distress. Eyes: No scleral icterus or jaundice. ENT: There was no oropharyngeal lesions. Neck was supple without thyromegaly. Lymphatics: Negative for cervical, supraclavicular, axillary, or inguinal adenopathy.  Respiratory: Lungs showed bibasilar mild crackles.  Cardiovascular: normal heart rate and rhythm; S1/S2; without murmur, rubs, or gallop. There was no pedal edema. GI: Abdomen was soft, flat, nontender, nondistended, without organomegaly.  There was increased bowel sound.  There was no increased in tympany or rebound tenderness.  NGT in placed.  Musculoskeletal exam: No spinal tenderness on palpation of vertebral spine. Skin exam was without ecchymosis, petechiae. Neuro exam was nonfocal. Patient was alert and oriented. Attention was good. Language was appropriate. Mood was normal without depression. Speech was not pressured. Thought content was not tangential.     Studies/Results: Results for orders placed during the hospital encounter of 05/23/11 (from  the past 48 hour(s))  GLUCOSE, CAPILLARY     Status: Abnormal   Collection Time   05/26/11 11:01 AM      Component Value Range Comment   Glucose-Capillary 200 (*) 70 - 99 (mg/dL)   GLUCOSE, CAPILLARY     Status: Abnormal   Collection Time   05/26/11  4:46 PM      Component Value Range Comment   Glucose-Capillary 162 (*) 70 - 99 (mg/dL)   GLUCOSE, CAPILLARY     Status: Abnormal   Collection Time   05/26/11  8:12 PM      Component Value Range Comment   Glucose-Capillary 126 (*) 70 - 99 (mg/dL)    Comment 1 Notify RN     GLUCOSE, CAPILLARY     Status: Abnormal   Collection Time   05/26/11 11:49 PM      Component Value Range Comment   Glucose-Capillary 157 (*) 70 - 99 (mg/dL)    Comment 1 Notify RN     GLUCOSE, CAPILLARY     Status: Abnormal   Collection Time   05/27/11  3:50 AM      Component Value Range Comment   Glucose-Capillary 167 (*) 70 - 99 (mg/dL)    Comment 1 Notify RN     PHOSPHORUS     Status: Abnormal   Collection Time   05/27/11  4:02 AM      Component Value Range Comment   Phosphorus 2.1 (*) 2.3 - 4.6 (mg/dL)   COMPREHENSIVE METABOLIC PANEL     Status: Abnormal   Collection Time   05/27/11  4:02 AM  Component Value Range Comment   Sodium 136  135 - 145 (mEq/L)    Potassium 3.2 (*) 3.5 - 5.1 (mEq/L)    Chloride 101  96 - 112 (mEq/L)    CO2 24  19 - 32 (mEq/L)    Glucose, Bld 157 (*) 70 - 99 (mg/dL)    BUN 18  6 - 23 (mg/dL)    Creatinine, Ser 1.61  0.50 - 1.10 (mg/dL)    Calcium 9.1  8.4 - 10.5 (mg/dL)    Total Protein 5.7 (*) 6.0 - 8.3 (g/dL)    Albumin 2.9 (*) 3.5 - 5.2 (g/dL)    AST 17  0 - 37 (U/L)    ALT 15  0 - 35 (U/L)    Alkaline Phosphatase 84  39 - 117 (U/L)    Total Bilirubin 0.3  0.3 - 1.2 (mg/dL)    GFR calc non Af Amer 59 (*) >90 (mL/min)    GFR calc Af Amer 69 (*) >90 (mL/min)   CBC     Status: Abnormal   Collection Time   05/27/11  4:02 AM      Component Value Range Comment   WBC 4.9  4.0 - 10.5 (K/uL)    RBC 3.70 (*) 3.87 - 5.11  (MIL/uL)    Hemoglobin 12.0  12.0 - 15.0 (g/dL)    HCT 09.6 (*) 04.5 - 46.0 (%)    MCV 94.6  78.0 - 100.0 (fL)    MCH 32.4  26.0 - 34.0 (pg)    MCHC 34.3  30.0 - 36.0 (g/dL)    RDW 40.9  81.1 - 91.4 (%)    Platelets 178  150 - 400 (K/uL)   GLUCOSE, CAPILLARY     Status: Abnormal   Collection Time   05/27/11  8:01 AM      Component Value Range Comment   Glucose-Capillary 120 (*) 70 - 99 (mg/dL)   GLUCOSE, CAPILLARY     Status: Abnormal   Collection Time   05/27/11 11:22 AM      Component Value Range Comment   Glucose-Capillary 178 (*) 70 - 99 (mg/dL)   GLUCOSE, CAPILLARY     Status: Abnormal   Collection Time   05/27/11 12:03 PM      Component Value Range Comment   Glucose-Capillary 185 (*) 70 - 99 (mg/dL)   GLUCOSE, CAPILLARY     Status: Abnormal   Collection Time   05/27/11  3:48 PM      Component Value Range Comment   Glucose-Capillary 138 (*) 70 - 99 (mg/dL)   GLUCOSE, CAPILLARY     Status: Abnormal   Collection Time   05/27/11  7:44 PM      Component Value Range Comment   Glucose-Capillary 196 (*) 70 - 99 (mg/dL)   GLUCOSE, CAPILLARY     Status: Abnormal   Collection Time   05/27/11 11:31 PM      Component Value Range Comment   Glucose-Capillary 179 (*) 70 - 99 (mg/dL)   GLUCOSE, CAPILLARY     Status: Abnormal   Collection Time   05/28/11  3:48 AM      Component Value Range Comment   Glucose-Capillary 206 (*) 70 - 99 (mg/dL)   BASIC METABOLIC PANEL     Status: Abnormal   Collection Time   05/28/11  6:50 AM      Component Value Range Comment   Sodium 133 (*) 135 - 145 (mEq/L)    Potassium 3.9  3.5 - 5.1 (  mEq/L) DELTA CHECK NOTED   Chloride 95 (*) 96 - 112 (mEq/L)    CO2 28  19 - 32 (mEq/L)    Glucose, Bld 208 (*) 70 - 99 (mg/dL)    BUN 21  6 - 23 (mg/dL)    Creatinine, Ser 1.61  0.50 - 1.10 (mg/dL)    Calcium 9.3  8.4 - 10.5 (mg/dL)    GFR calc non Af Amer 58 (*) >90 (mL/min)    GFR calc Af Amer 67 (*) >90 (mL/min)   MAGNESIUM     Status: Abnormal   Collection  Time   05/28/11  6:50 AM      Component Value Range Comment   Magnesium 1.4 (*) 1.5 - 2.5 (mg/dL)   PHOSPHORUS     Status: Abnormal   Collection Time   05/28/11  6:50 AM      Component Value Range Comment   Phosphorus 2.2 (*) 2.3 - 4.6 (mg/dL)   GLUCOSE, CAPILLARY     Status: Abnormal   Collection Time   05/28/11  7:49 AM      Component Value Range Comment   Glucose-Capillary 246 (*) 70 - 99 (mg/dL)      MEDICATIONS: reviewed.     Assessment/Plan:  1.  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 After these problems resolved she received 2 additional cycles of single-agent carboplatin completed in March 2011.   She had her first recurrence February 2012 treated with carboplatin and Gemzar for a total of 6 cycles between February and June 2012. Her CEA-125 had normalized before the beginning of cycle 5. She is now in second recurrence, s/p carboplatin given last week, doxil given 3/20 with good tolerance   2.  SBO secondary to above, being treated conservatively; some evidence of improvement   3.  Malnutrition: started TNA 3/20.   4.  Cough:  Will request 1 view CXR today.  FULL CODE.

## 2011-05-29 ENCOUNTER — Inpatient Hospital Stay (HOSPITAL_COMMUNITY): Payer: Medicare Other

## 2011-05-29 DIAGNOSIS — E876 Hypokalemia: Secondary | ICD-10-CM

## 2011-05-29 LAB — PHOSPHORUS: Phosphorus: 2.5 mg/dL (ref 2.3–4.6)

## 2011-05-29 LAB — MAGNESIUM: Magnesium: 1.5 mg/dL (ref 1.5–2.5)

## 2011-05-29 LAB — COMPREHENSIVE METABOLIC PANEL
Albumin: 2.8 g/dL — ABNORMAL LOW (ref 3.5–5.2)
BUN: 28 mg/dL — ABNORMAL HIGH (ref 6–23)
Calcium: 8.8 mg/dL (ref 8.4–10.5)
Chloride: 98 mEq/L (ref 96–112)
Creatinine, Ser: 0.82 mg/dL (ref 0.50–1.10)
Total Bilirubin: 0.5 mg/dL (ref 0.3–1.2)

## 2011-05-29 LAB — GLUCOSE, CAPILLARY
Glucose-Capillary: 144 mg/dL — ABNORMAL HIGH (ref 70–99)
Glucose-Capillary: 146 mg/dL — ABNORMAL HIGH (ref 70–99)

## 2011-05-29 MED ORDER — INSULIN REGULAR HUMAN 100 UNIT/ML IJ SOLN
INTRAVENOUS | Status: AC
  Administered 2011-05-29: 18:00:00 via INTRAVENOUS
  Filled 2011-05-29: qty 1680

## 2011-05-29 MED ORDER — POTASSIUM CHLORIDE IN NACL 40-0.9 MEQ/L-% IV SOLN
INTRAVENOUS | Status: DC
  Administered 2011-05-29 – 2011-06-06 (×5): via INTRAVENOUS
  Filled 2011-05-29 (×13): qty 1000

## 2011-05-29 MED ORDER — POTASSIUM CHLORIDE 10 MEQ/50ML IV SOLN
10.0000 meq | INTRAVENOUS | Status: AC
  Administered 2011-05-29 (×4): 10 meq via INTRAVENOUS
  Filled 2011-05-29 (×4): qty 50

## 2011-05-29 NOTE — Progress Notes (Signed)
PARENTERAL NUTRITION CONSULT NOTE   Pharmacy Consult for TNA Indication: SBO, malnutrition  Allergies  Allergen Reactions  . Avastin (Bevacizumab) Other (See Comments)    Swelling of the brain    Patient Measurements: Height: 5\' 6"  (167.6 cm) Weight: 156 lb (70.761 kg) IBW/kg (Calculated) : 59.3  Wt: 70.8 kg, Ht: 66 in  Vital Signs: Temp: 98.9 F (37.2 C) (03/24 0530) Temp src: Oral (03/24 0530) BP: 153/81 mmHg (03/24 0530) Pulse Rate: 101  (03/24 0530) Intake/Output from previous day: 03/23 0701 - 03/24 0700 In: 420 [TPN:420] Out: 1270 [Urine:520; Emesis/NG output:750] Intake/Output from this shift:    Labs:  [facility] 05/27/11 0402  WBC 4.9  HGB 12.0  HCT 35.0*  PLT 178  APTT --  INR --     Basename 05/29/11 1125 05/28/11 0650 05/27/11 0402  NA 134* 133* 136  K 3.1* 3.9 3.2*  CL 98 95* 101  CO2 26 28 24   GLUCOSE 151* 208* 157*  BUN 28* 21 18  CREATININE 0.82 0.92 0.90  LABCREA -- -- --  CREAT24HRUR -- -- --  CALCIUM 8.8 9.3 9.1  MG 1.5 1.4* --  PHOS 2.5 2.2* 2.1*  PROT 5.8* -- 5.7*  ALBUMIN 2.8* -- 2.9*  AST 20 -- 17  ALT 14 -- 15  ALKPHOS 82 -- 84  BILITOT 0.5 -- 0.3  BILIDIR -- -- --  IBILI -- -- --  PREALBUMIN -- -- --  TRIG -- -- --  CHOLHDL -- -- --  CHOL -- -- --   Estimated Creatinine Clearance: 52.1 ml/min (by C-G formula based on Cr of 0.82).    Basename 05/29/11 0729 05/29/11 0008 05/28/11 2010  GLUCAP 182* 144* 144*    Medical History: Past Medical History  Diagnosis Date  . Cancer     ovarian, peritoneal serous adenocarcin  . Hypertension   . Hyperlipidemia   . Seizure disorder   . Hiatal hernia   . Increased glucose level    Medications:  Scheduled:     . antiseptic oral rinse  15 mL Mouth Rinse BID  . bisacodyl  10 mg Rectal Once  . insulin aspart  0-20 Units Subcutaneous Q4H  . insulin glargine  15 Units Subcutaneous Daily  . ketorolac  15 mg Intravenous Q8H  . levetiracetam  250 mg Intravenous Q12H  .  pantoprazole (PROTONIX) IV  40 mg Intravenous Q24H  . potassium chloride  10 mEq Intravenous Q1 Hr x 3   Infusions:     . sodium chloride 10 mL/hr at 05/27/11 1059  . fat emulsion 240 mL (05/27/11 1756)  . TPN (CLINIMIX) +/- additives 50 mL/hr at 05/28/11 1744  . TPN (CLINIMIX) +/- additives 50 mL/hr at 05/27/11 1756    Insulin Requirements in the past 24 hours:  20 units SSI, Lantus 15 units daily  Current Nutrition:  Note that patient had not eaten x 2 weeks PTA. Pt is NPO, NG replaced. Current IVF: 1/2 NS at [facility].  Assessment: 78 yo female admitted with SBO and extended period of not eating. Started TNA for nutrition 3/20. Patient at risk of refeeding syndrome so have been advancing rate of TNA slowly.  CBGs better but consistently needing correction with SSI. Goal is to keep <150. Lantus increased 48hrs ago.   Na remains low. (Note on 1/2 NS)  Mg and Phos wnl now.   K low again.  Nutritional Goals:  RD recommendations:  kCal 2100-2500  Protein 85-105g Fluid 2.1-2.5L  Clinimix E 5/20 at  a goal rate of 80 ml/hr + lipids at 50ml/hr MWF will provide: 96g protein daily, 2169 kcal/day on days with Lipids, 1689 kcal/day on days without Lipids, Avg 1896 kcal/day/week  Plan:   Increase Clinimix E 5/20 to 70 ml/hr with next bag.  Add insulin to TNA to deliver ~16units/day.   Continue resistant SSI q4h and Lantus 15 units daily.  NOTE: Unable to increase potassium content of premixed TNA.   Change IVF to NS with 80mEq/L KCL at 1ml/hr to deliver 36mEq/day.  Give 4 KCl runs via central line today.    Lipids at 10 ml/hr on MWF due to national shortage.  MVI and trace elements on MWF only also due to national shortage.  TNA labs in am.  Charolotte Eke, PharmD, pager [phone]. 05/29/2011,12:04 PM.

## 2011-05-29 NOTE — Progress Notes (Signed)
Regina West   DOB:[birth year]   WR#:[id number]   XBJ#:[id number]  Subjective: sitting up comfortably today. She still has NGT in place. Right now it is not hooked up to suction but she seems to be comfortable. She is denying any pain no fevers or chills, she still does not have any flatus or bowel movements . Objective: Elderly white female examined in bed Filed Vitals:   05/29/11 0530  BP: 153/81  Pulse: 101  Temp: 98.9 F (37.2 C)  Resp: 16    Body mass index is 25.18 kg/(m^2).  Intake/Output Summary (Last 24 hours) at 05/29/11 1117 Last data filed at 05/29/11 0600  Gross per 24 hour  Intake    420 ml  Output   1000 ml  Net   -580 ml    Elderly frail female  Sclerae unicteric  Oropharynx clear; ng out  No peripheral adenopathy  Lungs auscultated anteriorly -- no rales or rhonchi  Heart regular rate and rhythm  Abdomen firm but not tense, bowel sounds not noted, nontender  Neuro nonfocal    CBG (last 3)   Basename 05/29/11 0729 05/29/11 0008 05/28/11 2010  GLUCAP 182* 144* 144*     Labs:  Lab Results  Component Value Date   WBC 4.9 05/27/2011   HGB 12.0 05/27/2011   HCT 35.0* 05/27/2011   MCV 94.6 05/27/2011   PLT 178 05/27/2011   NEUTROABS 3.6 05/26/2011     Lab 05/28/11 0650 05/27/11 0402 05/26/11 0410 05/25/11 0500 05/24/11 1039  NA 133* 136 131* 132* 129*  K 3.9 3.2* 3.8 3.5 3.2*  CL 95* 101 97 97 92*  CO2 28 24 23 25 25   GLUCOSE 208* 157* 299* 89 160*  BUN 21 18 17 15 20   CREATININE 0.92 0.90 0.96 0.97 1.03  CALCIUM 9.3 9.1 9.2 8.4 8.5  MG 1.4* -- 1.8 1.5 1.3*     Studies:  Dg Abd 1 View  05/28/2011  *RADIOLOGY REPORT*  Clinical Data: Abdominal pain and distention.  History of small bowel obstruction.  Constipation.  ABDOMEN - 1 VIEW  Comparison: 1 day prior  Findings: Single supine view.  Nasogastric tube only partially imaged.  The tip is likely within the hiatal hernia.  No gross free intraperitoneal air.  Gas filled small bowel loops measure up to  4.8 cm.  No pneumatosis.  Gas and contrast within normal caliber colon.  Distal gas identified.  Phleboliths in the pelvis.  Probable vascular or cartilaginous calcifications over the right upper quadrant.  IMPRESSION: Slight decrease in small bowel dilatation.  Either a mild adynamic ileus or low grade partial small bowel obstruction.  Original Report Authenticated By: Consuello Bossier, M.D.   Dg Chest Port 1 View  05/28/2011  *RADIOLOGY REPORT*  Clinical Data: Cough.  History of ovarian cancer and small bowel obstruction.  PORTABLE CHEST - 1 VIEW 05/28/2011 1105 hours:  Comparison: Two-view chest x-ray 05/02/2011 [facility] and one-view chest x-ray 06/01/2009 [facility].  Findings: Nasogastric tube looped in the stomach which is within the hiatal hernia above the diaphragm.  Cardiac silhouette normal in size, unchanged.  Thoracic aorta tortuous atherosclerotic, unchanged.  Hilar and mediastinal contours otherwise unremarkable. Mild atelectasis in the lung bases.  Lungs otherwise clear.  Right jugular Port-A-Cath tip remains in the SVC.  IMPRESSION: Mild bibasilar atelectasis.  No acute cardiopulmonary disease otherwise.  Nasogastric tube looped in the stomach which is within the hiatal hernia above the diaphragm.  Original Report Authenticated By:  Arnell Sieving, M.D.   Dg Regina West  05/27/2011  Fluoroscopically guided nasogastric tube placement.  Clinical history:  Small bowel obstruction.  Hiatal hernia.  Fluoroscopic time 2.16 minutes.  Procedure:  18-French nasogastric tube was placed under fluoroscopy.  The patient has a hiatal hernia.  The tip of the tube was left within the stomach above the hemidiaphragm on the left. Large volume of fluid emanated from the tube after placement.  Impression:  Successful NG tube placement with the tip left within the patient's hiatal hernia.  Original Report Authenticated By: Regina West. Regina West, M.D.    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 After these problems resolved she received 2 additional cycles of single-agent carboplatin completed in March 2011   (2) She had her first recurrence February 2012 treated with carboplatin and Gemzar for a total of 6 cycles between February and June 2012. Her CEA-125 had normalized before the beginning of cycle 5.   (3) now in second recurrence, s/p carboplatin given last week, doxil given 3/20 with good tolerance  (4) SBO secondary to recurrent ovarian cancer, currently conservative management in place.  (5) malnutrition: started TNA 3/20--patient is hyperglycemic and intensive SSI in place with relatively good control  6. Hypokalemia  7. Bibasilar atelectasis    Plan:   1. Continue conservative management of the SBO for now  2. Continue TNA for nutritional support, replace electrolytes as needed  3. Encourage early ambulation to help de-conditioning  4. Full Code  5. Spoke to patient and her daughter at length, answered most questions that were asked of me, final plans of how long the patient will be in the hospital deferred to Dr. Princella Pellegrini judgement.   Regina Renville K. 05/29/2011

## 2011-05-30 ENCOUNTER — Inpatient Hospital Stay (HOSPITAL_COMMUNITY): Payer: Medicare Other

## 2011-05-30 LAB — DIFFERENTIAL
Basophils Relative: 0 % (ref 0–1)
Eosinophils Absolute: 0.1 10*3/uL (ref 0.0–0.7)
Lymphs Abs: 0.6 10*3/uL — ABNORMAL LOW (ref 0.7–4.0)
Monocytes Absolute: 0.3 10*3/uL (ref 0.1–1.0)
Neutro Abs: 4.5 10*3/uL (ref 1.7–7.7)

## 2011-05-30 LAB — COMPREHENSIVE METABOLIC PANEL
AST: 28 U/L (ref 0–37)
Albumin: 2.5 g/dL — ABNORMAL LOW (ref 3.5–5.2)
Alkaline Phosphatase: 94 U/L (ref 39–117)
CO2: 24 mEq/L (ref 19–32)
Chloride: 102 mEq/L (ref 96–112)
GFR calc non Af Amer: 77 mL/min — ABNORMAL LOW (ref >90)
Potassium: 3.9 mEq/L (ref 3.5–5.1)
Total Bilirubin: 0.6 mg/dL (ref 0.3–1.2)

## 2011-05-30 LAB — TRIGLYCERIDES: Triglycerides: 57 mg/dL (ref <150)

## 2011-05-30 LAB — CBC
HCT: 31.7 % — ABNORMAL LOW (ref 36.0–46.0)
Hemoglobin: 10.9 g/dL — ABNORMAL LOW (ref 12.0–15.0)
MCH: 32.5 pg (ref 26.0–34.0)
MCHC: 34.4 g/dL (ref 30.0–36.0)
MCV: 94.6 fL (ref 78.0–100.0)

## 2011-05-30 LAB — GLUCOSE, CAPILLARY
Glucose-Capillary: 157 mg/dL — ABNORMAL HIGH (ref 70–99)
Glucose-Capillary: 153 mg/dL — ABNORMAL HIGH (ref 70–99)
Glucose-Capillary: 161 mg/dL — ABNORMAL HIGH (ref 70–99)
Glucose-Capillary: 146 mg/dL — ABNORMAL HIGH (ref 70–99)
Glucose-Capillary: 192 mg/dL — ABNORMAL HIGH (ref 70–99)

## 2011-05-30 MED ORDER — FAT EMULSION 20 % IV EMUL
250.0000 mL | INTRAVENOUS | Status: AC
  Administered 2011-05-30: 250 mL via INTRAVENOUS
  Filled 2011-05-30: qty 250

## 2011-05-30 MED ORDER — INSULIN GLARGINE 100 UNIT/ML ~~LOC~~ SOLN
12.0000 [IU] | Freq: Two times a day (BID) | SUBCUTANEOUS | Status: DC
  Administered 2011-05-30 (×2): 12 [IU] via SUBCUTANEOUS

## 2011-05-30 MED ORDER — TRACE MINERALS CR-CU-MN-SE-ZN 10-1000-500-60 MCG/ML IV SOLN
INTRAVENOUS | Status: AC
  Administered 2011-05-30: 18:00:00 via INTRAVENOUS
  Filled 2011-05-30: qty 2000

## 2011-05-30 MED ORDER — SCOPOLAMINE 1 MG/3DAYS TD PT72
1.0000 | MEDICATED_PATCH | TRANSDERMAL | Status: DC
  Administered 2011-05-30 – 2011-06-05 (×3): 1.5 mg via TRANSDERMAL
  Filled 2011-05-30 (×3): qty 1

## 2011-05-30 MED ORDER — LORAZEPAM 2 MG/ML IJ SOLN
0.5000 mg | Freq: Three times a day (TID) | INTRAMUSCULAR | Status: DC
  Administered 2011-05-30 – 2011-05-31 (×5): 0.5 mg via INTRAVENOUS
  Filled 2011-05-30 (×5): qty 1

## 2011-05-30 NOTE — Progress Notes (Signed)
PT Cancellation Note  Treatment cancelled today due to patient's refusal to participate.  Pt reporting not feeling well and refuses OOB activity today.  Regina West,[name] 05/30/2011, 3:39 PM Pager: [phone]

## 2011-05-30 NOTE — Plan of Care (Signed)
Problem: Inadequate Intake (NI-2.1) Goal: Food and/or nutrient delivery Individualized approach for food/nutrient provision.  Outcome: Not Progressing Remains on TNA for nutrition

## 2011-05-30 NOTE — Progress Notes (Signed)
Subjective: No better.  No worse, some flatus, but not much, no BM.  Objective: Vital signs in last 24 hours: Temp:  [98.3 F (36.8 C)-100 F (37.8 C)] 98.3 F (36.8 C) (03/25 0530) Pulse Rate:  [80-97] 80  (03/25 0530) Resp:  [17-18] 18  (03/25 0530) BP: (114-158)/(72-96) 158/94 mmHg (03/25 0530) SpO2:  [93 %-95 %] 93 % (03/25 0530) Last BM Date: 05/28/11 (very smal after suppository) Afebrile, VS shows an elevated BP Intake/Output from previous day: 03/24 0701 - 03/25 0700 In: 2380.7 [I.V.:321.7; IV Piggyback:615; TPN:1444] Out: 520 [Urine:400; Emesis/NG output:120] Intake/Output this shift:    GI: soft, BS hypoactive, not really tender.  NG with some drainage.not allot.  Lab Results:   Basename 05/30/11 0400  WBC 5.5  HGB 10.9*  HCT 31.7*  PLT PLATELET CLUMPS NOTED ON SMEAR, COUNT APPEARS ADEQUATE    BMET  Basename 05/30/11 0400 05/29/11 1125  NA 134* 134*  K 3.9 3.1*  CL 102 98  CO2 24 26  GLUCOSE 166* 151*  BUN 29* 28*  CREATININE 0.78 0.82  CALCIUM 9.1 8.8   PT/INR No results found for this basename: LABPROT:2,INR:2 in the last 72 hours   Lab 05/30/11 0400 05/29/11 1125 05/27/11 0402 05/26/11 0410 05/25/11 0500  AST 28 20 17 17 19   ALT 19 14 15 17 17   ALKPHOS 94 82 84 98 82  BILITOT 0.6 0.5 0.3 0.4 0.4  PROT 5.4* 5.8* 5.7* 6.1 5.4*  ALBUMIN 2.5* 2.8* 2.9* 3.0* 2.9*     Lipase     Component Value Date/Time   LIPASE 35 05/23/2011 2017     Studies/Results: Dg Abd 1 View  05/29/2011  *RADIOLOGY REPORT*  Clinical Data: Evaluate small bowel obstruction  ABDOMEN - 1 VIEW  Comparison: 05/28/2011; 05/27/2011; 05/26/2011; abdominal CT - 05/23/2011; chest radiograph - 05/28/2011  Findings:  No change to minimal worsening of gaseous distension of multiple loops of small bowel with index loop in the mid hemiabdomen now measuring 5.6 cm in diameter, previously, 4.8 cm.  Evaluation for pneumoperitoneum is limited secondary to supine patient positioning.   Limited visualization of the lower thorax demonstrates a portion of the enteric tube within a large hiatal hernia.  Previously ingested enteric contrast remains within the distal small bowel.  There is a conspicuous paucity of distal colonic gas.  Multilevel lumbar spine degenerative change.  IMPRESSION: 1.  No change to minimal worsening of findings compatible with high- grade partial small bowel obstruction. 2.  Enteric tube remains within the herniated portion of the previously identified hiatal herni.  Repositioning may be considered as clinically indicated.  Original Report Authenticated By: Waynard Reeds, M.D.   Dg Abd Portable 1v  05/30/2011  *RADIOLOGY REPORT*  Clinical Data: Small bowel obstruction.  Increased nausea vomiting.  PORTABLE ABDOMEN - 1 VIEW  Comparison: 05/29/2011  Findings: 0520 hours.  Supine film again shows some gaseous small bowel distention in the left abdomen.  A small bowel loops measure up to 4.822 cm in diameter today compared to 5.6 cm in maximum diameter previously.  IMPRESSION: No substantial interval change.  The patient does have some persistent dilated small bowel loops in the left abdomen.  Original Report Authenticated By: ERIC A. MANSELL, M.D.    Medications:    . antiseptic oral rinse  15 mL Mouth Rinse BID  . insulin aspart  0-20 Units Subcutaneous Q4H  . insulin glargine  12 Units Subcutaneous BID  . ketorolac  15 mg Intravenous Q8H  .  levetiracetam  250 mg Intravenous Q12H  . LORazepam  0.5 mg Intravenous TID  . pantoprazole (PROTONIX) IV  40 mg Intravenous Q24H  . potassium chloride  10 mEq Intravenous Q1 Hr x 4  . 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  I don't see the NG tube, I will see if we need to pt it in further.   LOS: 7 days    Broughton Eppinger 05/30/2011

## 2011-05-30 NOTE — Progress Notes (Signed)
Regina West   DOB:1929/05/24   EA#:[id number]   YNW#:[id number]  Subjective: Regina West is very discouraged today--"I just can't fight anymore; I'm ready to give up." She is getting OOBTC some and walks to [place] but has not walked in halls so far today. Not passing gas. Minimal to no pain in abdomen, but finds ng very uncomfortable. Family is requesting an antidepressant.   Objective:  Filed Vitals:   05/30/11 0530  BP: 158/94  Pulse: 80  Temp: 98.3 F (36.8 C)  Resp: 18    Body mass index is 25.18 kg/(m^2).  Intake/Output Summary (Last 24 hours) at 05/30/11 1329 Last data filed at 05/30/11 0600  Gross per 24 hour  Intake 2380.67 ml  Output    500 ml  Net 1880.67 ml     Sclerae unicteric  Oropharynx clear  No peripheral adenopathy  Lungs clear -- no rales or rhonchi  Heart regular rate and rhythm  Abdomen distended, not terse, no bowel sounds, NT  MSK no focal spinal tenderness, no peripheral edema  Neuro A&)x3, depressed affect    CBG (last 3)   Basename 05/30/11 1209 05/30/11 0728 05/30/11 0355  GLUCAP 161* 153* 157*     Labs:  Lab Results  Component Value Date   WBC 5.5 05/30/2011   HGB 10.9* 05/30/2011   HCT 31.7* 05/30/2011   MCV 94.6 05/30/2011   PLT PLATELET CLUMPS NOTED ON SMEAR, COUNT APPEARS ADEQUATE 05/30/2011   NEUTROABS 4.5 05/30/2011     Lab 05/30/11 0400 05/29/11 1125 05/28/11 0650 05/27/11 0402 05/26/11 0410 05/25/11 0500  NA 134* 134* 133* 136 131* --  K 3.9 3.1* 3.9 3.2* 3.8 --  CL 102 98 95* 101 97 --  CO2 24 26 28 24 23  --  GLUCOSE 166* 151* 208* 157* 299* --  BUN 29* 28* 21 18 17  --  CREATININE 0.78 0.82 0.92 0.90 0.96 --  CALCIUM 9.1 8.8 9.3 9.1 9.2 --  MG 1.6 1.5 1.4* -- 1.8 1.5    liver function tests  Urine Studies No results found for this basename: UACOL:2,UAPR:2,USPG:2,UPH:2,UTP:2,UGL:2,UKET:2,UBIL:2,UHGB:2,UNIT:2,UROB:2,ULEU:2,UEPI:2,UWBC:2,URBC:2,UBAC:2,CAST:2,CRYS:2,UCOM:2,BILUA:2 in the last 72 hours     Studies:  Dg Abd 1  View  05/29/2011  *RADIOLOGY REPORT*  Clinical Data: Evaluate small bowel obstruction  ABDOMEN - 1 VIEW  Comparison: 05/28/2011; 05/27/2011; 05/26/2011; abdominal CT - 05/23/2011; chest radiograph - 05/28/2011  Findings:  No change to minimal worsening of gaseous distension of multiple loops of small bowel with index loop in the mid hemiabdomen now measuring 5.6 cm in diameter, previously, 4.8 cm.  Evaluation for pneumoperitoneum is limited secondary to supine patient positioning.  Limited visualization of the lower thorax demonstrates a portion of the enteric tube within a large hiatal hernia.  Previously ingested enteric contrast remains within the distal small bowel.  There is a conspicuous paucity of distal colonic gas.  Multilevel lumbar spine degenerative change.  IMPRESSION: 1.  No change to minimal worsening of findings compatible with high- grade partial small bowel obstruction. 2.  Enteric tube remains within the herniated portion of the previously identified hiatal herni.  Repositioning may be considered as clinically indicated.  Original Report Authenticated By: Waynard Reeds, M.D.   Dg Abd Portable 1v  05/30/2011  *RADIOLOGY REPORT*  Clinical Data: Small bowel obstruction.  Increased nausea vomiting.  PORTABLE ABDOMEN - 1 VIEW  Comparison: 05/29/2011  Findings: 0520 hours.  Supine film again shows some gaseous small bowel distention in the left abdomen.  A small bowel loops measure  up to 4.822 cm in diameter today compared to 5.6 cm in maximum diameter previously.  IMPRESSION: No substantial interval change.  The patient does have some persistent dilated small bowel loops in the left abdomen.  Original Report Authenticated By: ERIC A. MANSELL, M.D.    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 After these problems resolved she received 2 additional cycles of single-agent carboplatin completed in March 2011   (2) She had her first recurrence February 2012 treated with carboplatin and Gemzar for a total of 6 cycles between February and June 2012. Her CEA-125 had normalized before the beginning of cycle 5.   (3) now in second recurrence, currently day 14 cycle 1 carboplatin/doxil, with good tolerance  (4) SBO diagnosed by CT 03/18 with transition point near terminal ileum  (5) malnutrition   Plan: there is no improvement so far in her SBO; I am adding some lorazepam for comfort, continue to avoid narcotics, continue npo x icechips, continue TNA; if there is no improvement in another week or so may have to consider surgery; encouraged ambulation   Dalal Livengood C 05/30/2011

## 2011-05-30 NOTE — Progress Notes (Signed)
Brought portable suction to room last night to see if it would help patient manage large amount of secretions produced with cough. Patient returned demonstration on its use to remove secretions from mouth. Patient says" Yes" it has helped her manage her secretions.

## 2011-05-30 NOTE — Progress Notes (Signed)
Pt has not felt well today.  She is down and only wants to stay in bed. She feels tender all over and states she feels like she is coming down with something.   Very little is coming out of the ng tube. It was 150 CC at 0700, and now @ 1535  She has 175 cc. Secretions have increased and coughing is becoming hard for her.  Will notify MD.

## 2011-05-30 NOTE — Progress Notes (Signed)
PARENTERAL NUTRITION CONSULT NOTE   Pharmacy Consult for TNA Indication: SBO, malnutrition  Allergies  Allergen Reactions  . Avastin (Bevacizumab) Other (See Comments)    Swelling of the brain    Patient Measurements: Height: 5\' 6"  (167.6 cm) Weight: 156 lb (70.761 kg) IBW/kg (Calculated) : 59.3  Wt: 70.8 kg, Ht: 66 in  Vital Signs: Temp: 98.3 F (36.8 C) (03/25 0530) Temp src: Oral (03/25 0530) BP: 158/94 mmHg (03/25 0530) Pulse Rate: 80  (03/25 0530) Intake/Output from previous day: 03/24 0701 - 03/25 0700 In: 2380.7 [I.V.:321.7; IV Piggyback:615; TPN:1444] Out: 520 [Urine:400; Emesis/NG output:120] Intake/Output from this shift:    Labs:  Basename 05/30/11 0400  WBC 5.5  HGB 10.9*  HCT 31.7*  PLT PLATELET CLUMPS NOTED ON SMEAR, COUNT APPEARS ADEQUATE  APTT --  INR --     Basename 05/30/11 0400 05/29/11 1125 05/28/11 0650  NA 134* 134* 133*  K 3.9 3.1* 3.9  CL 102 98 95*  CO2 24 26 28   GLUCOSE 166* 151* 208*  BUN 29* 28* 21  CREATININE 0.78 0.82 0.92  LABCREA -- -- --  CREAT24HRUR -- -- --  CALCIUM 9.1 8.8 9.3  MG 1.6 1.5 1.4*  PHOS 2.6 2.5 2.2*  PROT 5.4* 5.8* --  ALBUMIN 2.5* 2.8* --  AST 28 20 --  ALT 19 14 --  ALKPHOS 94 82 --  BILITOT 0.6 0.5 --  BILIDIR -- -- --  IBILI -- -- --  PREALBUMIN -- -- --  TRIG 57 -- --  CHOLHDL -- -- --  CHOL 97 -- --   Estimated Creatinine Clearance: 53.4 ml/min (by C-G formula based on Cr of 0.78).    Basename 05/30/11 0728 05/30/11 0355 05/30/11 0020  GLUCAP 153* 157* 141*    Medical History: Past Medical History  Diagnosis Date  . Cancer     ovarian, peritoneal serous adenocarcin  . Hypertension   . Hyperlipidemia   . Seizure disorder   . Hiatal hernia   . Increased glucose level    Medications:  Scheduled:     . antiseptic oral rinse  15 mL Mouth Rinse BID  . insulin aspart  0-20 Units Subcutaneous Q4H  . insulin glargine  15 Units Subcutaneous Daily  . ketorolac  15 mg Intravenous Q8H   . levetiracetam  250 mg Intravenous Q12H  . LORazepam  0.5 mg Intravenous TID  . pantoprazole (PROTONIX) IV  40 mg Intravenous Q24H  . potassium chloride  10 mEq Intravenous Q1 Hr x 4   Infusions:     . 0.9 % NaCl with KCl 40 mEq / L 20 mL/hr at 05/29/11 1355  . TPN (CLINIMIX) +/- additives 50 mL/hr at 05/28/11 1744  . TPN (CLINIMIX) +/- additives 70 mL/hr at 05/29/11 1748  . DISCONTD: sodium chloride 10 mL/hr at 05/27/11 1059    Insulin Requirements in the past 24 hours:  25 units SSI, Lantus 15 units daily, Insulin in TNA ~ 16 units/day  Current Nutrition:  Note that patient had not eaten x 2 weeks PTA. Pt is NPO, NG replaced. Current IVF: NS w/ KCl/ L @ 90ml/hr Clinimix E 5/20% @ 66ml/hr  Nutritional Goals:  RD recommendations:  kCal 2100-2500  Protein 85-105g Fluid 2.1-2.5L  Clinimix E 5/20 at a goal rate of 80 ml/hr + lipids at 45ml/hr MWF will provide: 96g protein daily, 2169 kcal/day on days with Lipids, 1689 kcal/day on days without Lipids, Avg 1896 kcal/day/week  Assessment: 78 yo female admitted with  SBO and extended period of not eating. Started TNA for nutrition 3/20. Patient at risk of refeeding syndrome so have been advancing rate of TNA slowly.  CBGs better. Still needing correction with SSI. Goal is to keep <150.   Na remains low. (Note on 1/2 NS)  Other lytes wnl (K wnl after K runs x 4 yesterday and change in MIVF)  Lipids wnl  PAB pending (9.0 on 3/21)   Plan:   Increase Clinimix E 5/20 to 80 ml/hr with next bag.  Remove insulin in TNA and increase Lantus so that only one form of basal coverage: Lantus 12 units bid   Continue resistant SSI q4h  NOTE: Unable to increase potassium content of premixed TNA.   Continue MIVF of NS with 8mEq/L KCL at 1ml/hr to deliver 55mEq/day.  Lipids at 10 ml/hr on MWF due to national shortage.  MVI and trace elements on MWF only also due to national shortage.  F/U PAB when available  AM  BMET  Gwen Her PharmD  [phone] 05/30/2011 8:25 AM

## 2011-05-30 NOTE — Progress Notes (Signed)
Pt is not progressing right now, but question placement of NGT.  Looks like it may be kinked in hiatal hernia.  If she does not improve, would recommend surgery at [facility] by Gyn Onc because they may treat differently operatively in cases of recurrences.  Would give more time after NGT placed properly.

## 2011-05-30 NOTE — Progress Notes (Signed)
Nutrition Follow-up  Diet Order: NPO  TNA: Clinimix E 5/20 @ 70 ml/hr.  Lipids (20% IVFE @ 10 ml/hr), multivitamins, and trace elements are provided 3 times weekly (MWF) due to national backorder.  Provides 1684 kcal and 84 grams protein daily (based on weekly average).  Meets 80% minimum estimated kcal and 99% minimum estimated protein needs.  Additional IVF with NS with KCl 45mEq/L @ 20 ml/hr.  - Pt with productive cough of yellow sputum since 3/22, had NGT repaired/replaced 3/22. Pt reports a little bit of nausea and that sore throat/cough slowly improving. Pt denies passing any significant amount of gas. DG of abdomen on 3/25 showed no substantial interval change, the patient does have some  persistent dilated small bowel loops in the left abdomen.   Meds: Scheduled Meds:   . antiseptic oral rinse  15 mL Mouth Rinse BID  . insulin aspart  0-20 Units Subcutaneous Q4H  . insulin glargine  12 Units Subcutaneous BID  . ketorolac  15 mg Intravenous Q8H  . levetiracetam  250 mg Intravenous Q12H  . LORazepam  0.5 mg Intravenous TID  . pantoprazole (PROTONIX) IV  40 mg Intravenous Q24H  . potassium chloride  10 mEq Intravenous Q1 Hr x 4  . DISCONTD: insulin glargine  15 Units Subcutaneous Daily   Continuous Infusions:   . 0.9 % NaCl with KCl 40 mEq / L 20 mL/hr at 05/29/11 1355  . fat emulsion    . TPN (CLINIMIX) +/- additives 50 mL/hr at 05/28/11 1744  . TPN (CLINIMIX) +/- additives 70 mL/hr at 05/29/11 1748  . TPN (CLINIMIX) +/- additives    . DISCONTD: sodium chloride 10 mL/hr at 05/27/11 1059   PRN Meds:.ondansetron (ZOFRAN) IV, phenol, sodium chloride, sodium chloride  Labs:  CMP     Component Value Date/Time   NA 134* 05/30/2011 0400   NA 143 05/12/2011 1337   K 3.9 05/30/2011 0400   K 4.1 05/12/2011 1337   CL 102 05/30/2011 0400   CL 92* 05/12/2011 1337   CO2 24 05/30/2011 0400   CO2 32 05/12/2011 1337   GLUCOSE 166* 05/30/2011 0400   GLUCOSE 173* 05/12/2011 1337   BUN 29*  05/30/2011 0400   BUN 19 05/12/2011 1337   CREATININE 0.78 05/30/2011 0400   CREATININE 1.3* 05/12/2011 1337   CALCIUM 9.1 05/30/2011 0400   CALCIUM 9.8 05/12/2011 1337   PROT 5.4* 05/30/2011 0400   PROT 6.2* 05/12/2011 1337   ALBUMIN 2.5* 05/30/2011 0400   AST 28 05/30/2011 0400   AST 40* 05/12/2011 1337   ALT 19 05/30/2011 0400   ALKPHOS 94 05/30/2011 0400   ALKPHOS 95* 05/12/2011 1337   BILITOT 0.6 05/30/2011 0400   BILITOT 0.60 05/12/2011 1337   GFRNONAA 77* 05/30/2011 0400   GFRAA 90* 05/30/2011 0400   CBG (last 3)   Basename 05/30/11 0728 05/30/11 0355 05/30/11 0020  GLUCAP 153* 157* 141*   - PALB 9 on 3/20 - Noted sodium slightly low - Potassium, phosphorus, and magnesium WNL - CBGs improved but still needing correction with SSI, goal is <150   Intake/Output Summary (Last 24 hours) at 05/30/11 1037 Last data filed at 05/30/11 0600  Gross per 24 hour  Intake 2380.67 ml  Output    500 ml  Net 1880.67 ml   Last BM - 3/23 - very small stool after suppository per RN  Weight Status: No new weights  Re-estimated needs:  No changes. Kcal: 2100-2500  Protein: 85-105g  Fluid: 2.1-2.5L  Nutrition Dx: Inadequate oral intake - ongoing  Goal:  TNA to meet =/> 90% of estimated nutritional needs - not met r/t slow rate of advancement due to high risk of refeeding syndrome, however will be met with next bag's advancement to 26ml/hr of Clinimix 5/20  Intervention: TNA per pharmacy. Diet advancement per MD. Recommend re-weigh pt.   Monitor: Weights, labs, electrolytes, BM, TNA, diet advancement   Marshall Cork Pager #: [phone]

## 2011-05-30 NOTE — Progress Notes (Signed)
Patient did walk the hall again last night -did ambulate a total of 4 times on Sunday.

## 2011-05-31 ENCOUNTER — Inpatient Hospital Stay (HOSPITAL_COMMUNITY): Payer: Medicare Other

## 2011-05-31 LAB — GLUCOSE, CAPILLARY
Glucose-Capillary: 158 mg/dL — ABNORMAL HIGH (ref 70–99)
Glucose-Capillary: 163 mg/dL — ABNORMAL HIGH (ref 70–99)
Glucose-Capillary: 172 mg/dL — ABNORMAL HIGH (ref 70–99)
Glucose-Capillary: 255 mg/dL — ABNORMAL HIGH (ref 70–99)
Glucose-Capillary: 104 mg/dL — ABNORMAL HIGH (ref 70–99)

## 2011-05-31 MED ORDER — KETOROLAC TROMETHAMINE 15 MG/ML IJ SOLN
15.0000 mg | Freq: Three times a day (TID) | INTRAMUSCULAR | Status: AC | PRN
  Administered 2011-05-31: 15 mg via INTRAVENOUS

## 2011-05-31 MED ORDER — INSULIN GLARGINE 100 UNIT/ML ~~LOC~~ SOLN
15.0000 [IU] | Freq: Two times a day (BID) | SUBCUTANEOUS | Status: DC
  Administered 2011-05-31 – 2011-06-01 (×3): 15 [IU] via SUBCUTANEOUS

## 2011-05-31 MED ORDER — CLINIMIX E/DEXTROSE (5/20) 5 % IV SOLN
INTRAVENOUS | Status: DC
  Administered 2011-05-31: 18:00:00 via INTRAVENOUS
  Filled 2011-05-31: qty 2000

## 2011-05-31 NOTE — Progress Notes (Signed)
Feeling much better. Had large BM and much flatus.

## 2011-05-31 NOTE — Progress Notes (Signed)
PARENTERAL NUTRITION CONSULT NOTE   Pharmacy Consult for TNA Indication: SBO, malnutrition  Allergies  Allergen Reactions  . Avastin (Bevacizumab) Other (See Comments)    Swelling of the brain    Patient Measurements: Height: 5\' 6"  (167.6 cm) Weight: 156 lb (70.761 kg) IBW/kg (Calculated) : 59.3  Wt: 70.8 kg, Ht: 66 in  Vital Signs: Temp: 97.8 F (36.6 C) (03/26 0418) Temp src: Oral (03/26 0418) BP: 156/89 mmHg (03/26 0418) Pulse Rate: 95  (03/26 0418) Intake/Output from previous day: 03/25 0701 - 03/26 0700 In: -  Out: 1250 [Urine:950; Emesis/NG output:300] Intake/Output from this shift:    Labs:  Basename 05/30/11 0400  WBC 5.5  HGB 10.9*  HCT 31.7*  PLT PLATELET CLUMPS NOTED ON SMEAR, COUNT APPEARS ADEQUATE  APTT --  INR --     Basename 05/30/11 0400 05/29/11 1125  NA 134* 134*  K 3.9 3.1*  CL 102 98  CO2 24 26  GLUCOSE 166* 151*  BUN 29* 28*  CREATININE 0.78 0.82  LABCREA -- --  CREAT24HRUR -- --  CALCIUM 9.1 8.8  MG 1.6 1.5  PHOS 2.6 2.5  PROT 5.4* 5.8*  ALBUMIN 2.5* 2.8*  AST 28 20  ALT 19 14  ALKPHOS 94 82  BILITOT 0.6 0.5  BILIDIR -- --  IBILI -- --  PREALBUMIN 7.0* --  TRIG 57 --  CHOLHDL -- --  CHOL 97 --   Estimated Creatinine Clearance: 53.4 ml/min (by C-G formula based on Cr of 0.78).    Basename 05/30/11 2010 05/30/11 1600 05/30/11 1209  GLUCAP 192* 146* 161*    Medical History: Past Medical History  Diagnosis Date  . Cancer     ovarian, peritoneal serous adenocarcin  . Hypertension   . Hyperlipidemia   . Seizure disorder   . Hiatal hernia   . Increased glucose level    Medications:  Scheduled:     . antiseptic oral rinse  15 mL Mouth Rinse BID  . insulin aspart  0-20 Units Subcutaneous Q4H  . insulin glargine  12 Units Subcutaneous BID  . ketorolac  15 mg Intravenous Q8H  . levetiracetam  250 mg Intravenous Q12H  . LORazepam  0.5 mg Intravenous TID  . pantoprazole (PROTONIX) IV  40 mg Intravenous Q24H  .  scopolamine  1 patch Transdermal Q72H  . DISCONTD: insulin glargine  15 Units Subcutaneous Daily   Infusions:     . 0.9 % NaCl with KCl 40 mEq / L 20 mL/hr at 05/29/11 1355  . fat emulsion 250 mL (05/30/11 1806)  . TPN (CLINIMIX) +/- additives 70 mL/hr at 05/29/11 1748  . TPN (CLINIMIX) +/- additives 80 mL/hr at 05/30/11 1805    Insulin Requirements in the past 24 hours:  23 units SSI, Lantus 12 units BID  Current Nutrition:  Note that patient had not eaten x 2 weeks PTA. Pt is NPO, NG replaced. Current IVF: NS w/ KCl/ L @ 43ml/hr Clinimix E 5/20% @ 48ml/hr, Lipids 20% MWF only @ 110ml/hr  Nutritional Goals:  RD recommendations:  kCal 2100-2500  Protein 85-105g Fluid 2.1-2.5L  Clinimix E 5/20 at a goal rate of 80 ml/hr + lipids at 83ml/hr MWF will provide: 96g protein daily, 2169 kcal/day on days with Lipids, 1689 kcal/day on days without Lipids, Avg 1896 kcal/day/week  Assessment: 78 yo female admitted with SBO and extended period of not eating. Started TNA for nutrition 3/20. Patient at risk of refeeding syndrome so have been advancing rate of TNA  slowly. Advanced to goal last night  CBGs still needing correction with SSI. Goal is to keep <150.   Na remains low. Cannot change Na concentration (or concentration of other lytes) in TNA  Other lytes wnl 3/25 - no new chemistries today. K wnl 3/25 after K runs x 4 3/24 and change in MIVF  Lipids wnl 3/25  PAB 7 3/25, was 9 on 3/21; just advanced to goal last night   Plan:   Continue Clinimix E 5/20 to 80 ml/hr with next bag.  Increase Lantus to 15 units bid, continue resistant SSI q4h  Continue MIVF of NS with 40mEq/L KCL at 12ml/hr to deliver 23mEq/day.  Lipids at 10 ml/hr on MWF due to national shortage.  MVI and trace elements on MWF only also due to national shortage.  AM BMET  Gwen Her PharmD  [phone] 05/31/2011 7:12 AM

## 2011-05-31 NOTE — Progress Notes (Signed)
Subjective: +BM  Very sleepy right now.  300 ml per NG recorded yesterday.  Objective: Vital signs in last 24 hours: Temp:  [97.8 F (36.6 C)-98.1 F (36.7 C)] 97.8 F (36.6 C) (03/26 0418) Pulse Rate:  [90-95] 95  (03/26 0418) Resp:  [17-18] 18  (03/26 0418) BP: (143-156)/(84-89) 156/89 mmHg (03/26 0418) SpO2:  [96 %-98 %] 98 % (03/26 0418) Last BM Date: 05/28/11  Intake/Output from previous day: 03/25 0701 - 03/26 0700 In: -  Out: 1250 [Urine:950; Emesis/NG output:300] Intake/Output this shift:    General appearance: sleepy took a couople minutes to wake her up. GI: soft, maybe less distended.  Film sl. better. +BS +BM  Lab Results:   Basename 05/30/11 0400  WBC 5.5  HGB 10.9*  HCT 31.7*  PLT PLATELET CLUMPS NOTED ON SMEAR, COUNT APPEARS ADEQUATE    BMET  Basename 05/30/11 0400 05/29/11 1125  NA 134* 134*  K 3.9 3.1*  CL 102 98  CO2 24 26  GLUCOSE 166* 151*  BUN 29* 28*  CREATININE 0.78 0.82  CALCIUM 9.1 8.8   PT/INR No results found for this basename: LABPROT:2,INR:2 in the last 72 hours   Lab 05/30/11 0400 05/29/11 1125 05/27/11 0402 05/26/11 0410 05/25/11 0500  AST 28 20 17 17 19   ALT 19 14 15 17 17   ALKPHOS 94 82 84 98 82  BILITOT 0.6 0.5 0.3 0.4 0.4  PROT 5.4* 5.8* 5.7* 6.1 5.4*  ALBUMIN 2.5* 2.8* 2.9* 3.0* 2.9*     Lipase     Component Value Date/Time   LIPASE 35 05/23/2011 2017     Studies/Results: Dg Abd 1 View  05/31/2011  *RADIOLOGY REPORT*  Clinical Data: Follow up SBO  ABDOMEN - 1 VIEW  Comparison: 05/30/2011  Findings: Stable moderately dilated loops of small bowel in the left mid abdomen.  Decreased overall stool burden.  Colonic diverticulosis.  Degenerative changes of the visualized thoracolumbar spine.  IMPRESSION: Stable moderately dilated loops of small bowel in the left mid abdomen, compatible with partial small bowel obstruction.  Decreased overall stone burden.  Original Report Authenticated By: Charline Bills, M.D.    Dg Abd 1 View  05/29/2011  *RADIOLOGY REPORT*  Clinical Data: Evaluate small bowel obstruction  ABDOMEN - 1 VIEW  Comparison: 05/28/2011; 05/27/2011; 05/26/2011; abdominal CT - 05/23/2011; chest radiograph - 05/28/2011  Findings:  No change to minimal worsening of gaseous distension of multiple loops of small bowel with index loop in the mid hemiabdomen now measuring 5.6 cm in diameter, previously, 4.8 cm.  Evaluation for pneumoperitoneum is limited secondary to supine patient positioning.  Limited visualization of the lower thorax demonstrates a portion of the enteric tube within a large hiatal hernia.  Previously ingested enteric contrast remains within the distal small bowel.  There is a conspicuous paucity of distal colonic gas.  Multilevel lumbar spine degenerative change.  IMPRESSION: 1.  No change to minimal worsening of findings compatible with high- grade partial small bowel obstruction. 2.  Enteric tube remains within the herniated portion of the previously identified hiatal herni.  Repositioning may be considered as clinically indicated.  Original Report Authenticated By: Waynard Reeds, M.D.   Dg Abd Portable 1v  05/30/2011  *RADIOLOGY REPORT*  Clinical Data: Small bowel obstruction.  Increased nausea vomiting.  PORTABLE ABDOMEN - 1 VIEW  Comparison: 05/29/2011  Findings: 0520 hours.  Supine film again shows some gaseous small bowel distention in the left abdomen.  A small bowel loops measure up to  4.822 cm in diameter today compared to 5.6 cm in maximum diameter previously.  IMPRESSION: No substantial interval change.  The patient does have some persistent dilated small bowel loops in the left abdomen.  Original Report Authenticated By: ERIC A. MANSELL, M.D.    Medications:    . antiseptic oral rinse  15 mL Mouth Rinse BID  . insulin aspart  0-20 Units Subcutaneous Q4H  . insulin glargine  15 Units Subcutaneous BID  . levetiracetam  250 mg Intravenous Q12H  . LORazepam  0.5 mg  Intravenous TID  . pantoprazole (PROTONIX) IV  40 mg Intravenous Q24H  . scopolamine  1 patch Transdermal Q72H  . DISCONTD: insulin glargine  12 Units Subcutaneous BID  . 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  Stool burden in colon is smaller, she still has some dilatation on film of Small bowel  and she had a BM.  Started on clears by Dr. Darnelle Catalan, will follow with you.    LOS: 8 days    Regina West 05/31/2011

## 2011-05-31 NOTE — Progress Notes (Signed)
Regina West   DOB:Jul 31, 1929   BM#:[id number]   UUV#:[id number]  Subjective: She is sitting on the potty "being productive." Wants the ng out ASAP. Complains of pain across upper chest bilaterally--mild to moderate. Much less depressed than yesterday   Objective:  Filed Vitals:   05/31/11 0418  BP: 156/89  Pulse: 95  Temp: 97.8 F (36.6 C)  Resp: 18    Body mass index is 25.18 kg/(m^2).  Intake/Output Summary (Last 24 hours) at 05/31/11 0959 Last data filed at 05/31/11 0500  Gross per 24 hour  Intake      0 ml  Output   1250 ml  Net  -1250 ml     Sclerae unicteric  Oropharynx clear  No peripheral adenopathy  Lungs clear -- no rales or rhonchi  Heart regular rate and rhythm  Abdomen minimal BS, NT--examined with pt. Sitting   MSK no focal spinal tenderness, no peripheral edema  Neuro nonfocal    CBG (last 3)   Basename 05/30/11 2010 05/30/11 1600 05/30/11 1209  GLUCAP 192* 146* 161*     Labs:  Lab Results  Component Value Date   WBC 5.5 05/30/2011   HGB 10.9* 05/30/2011   HCT 31.7* 05/30/2011   MCV 94.6 05/30/2011   PLT PLATELET CLUMPS NOTED ON SMEAR, COUNT APPEARS ADEQUATE 05/30/2011   NEUTROABS 4.5 05/30/2011     Lab 05/30/11 0400 05/29/11 1125 05/28/11 0650 05/27/11 0402 05/26/11 0410 05/25/11 0500  NA 134* 134* 133* 136 131* --  K 3.9 3.1* 3.9 3.2* 3.8 --  CL 102 98 95* 101 97 --  CO2 24 26 28 24 23  --  GLUCOSE 166* 151* 208* 157* 299* --  BUN 29* 28* 21 18 17  --  CREATININE 0.78 0.82 0.92 0.90 0.96 --  CALCIUM 9.1 8.8 9.3 9.1 9.2 --  MG 1.6 1.5 1.4* -- 1.8 1.5    Urine Studies No results found for this basename: UACOL:2,UAPR:2,USPG:2,UPH:2,UTP:2,UGL:2,UKET:2,UBIL:2,UHGB:2,UNIT:2,UROB:2,ULEU:2,UEPI:2,UWBC:2,URBC:2,UBAC:2,CAST:2,CRYS:2,UCOM:2,BILUA:2 in the last 72 hours     Studies:  Dg Abd 1 View  05/29/2011  *RADIOLOGY REPORT*  Clinical Data: Evaluate small bowel obstruction  ABDOMEN - 1 VIEW  Comparison: 05/28/2011; 05/27/2011; 05/26/2011;  abdominal CT - 05/23/2011; chest radiograph - 05/28/2011  Findings:  No change to minimal worsening of gaseous distension of multiple loops of small bowel with index loop in the mid hemiabdomen now measuring 5.6 cm in diameter, previously, 4.8 cm.  Evaluation for pneumoperitoneum is limited secondary to supine patient positioning.  Limited visualization of the lower thorax demonstrates a portion of the enteric tube within a large hiatal hernia.  Previously ingested enteric contrast remains within the distal small bowel.  There is a conspicuous paucity of distal colonic gas.  Multilevel lumbar spine degenerative change.  IMPRESSION: 1.  No change to minimal worsening of findings compatible with high- grade partial small bowel obstruction. 2.  Enteric tube remains within the herniated portion of the previously identified hiatal herni.  Repositioning may be considered as clinically indicated.  Original Report Authenticated By: Waynard Reeds, M.D.   Dg Abd Portable 1v  05/30/2011  *RADIOLOGY REPORT*  Clinical Data: Small bowel obstruction.  Increased nausea vomiting.  PORTABLE ABDOMEN - 1 VIEW  Comparison: 05/29/2011  Findings: 0520 hours.  Supine film again shows some gaseous small bowel distention in the left abdomen.  A small bowel loops measure up to 4.822 cm in diameter today compared to 5.6 cm in maximum diameter previously.  IMPRESSION: No substantial interval change.  The patient does  have some persistent dilated small bowel loops in the left abdomen.  Original Report Authenticated By: ERIC A. MANSELL, M.D.    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  After these problems resolved she received 2 additional cycles of single-agent carboplatin completed in March 2011   (2) She had her first recurrence February 2012 treated with carboplatin and Gemzar for a total of 6 cycles between February and June 2012. Her CEA-125 had normalized before the beginning of cycle 5.   (3) now in second recurrence, currently day 14 cycle 1 carboplatin/doxil, with good tolerance   (4) SBO diagnosed by CT 03/18 with transition point near terminal ileum   (5) malnutrition    Plan: I will recheck a KUB this AM and if improved will remove ng, which is causing her significant distress; will increase diet to clear liquids; if improvement continues will taper TNA to off tomorrow; hope for d/c to home 3/28--discussed with patient and da-in-law   Happy Ky C 05/31/2011

## 2011-06-01 ENCOUNTER — Inpatient Hospital Stay (HOSPITAL_COMMUNITY): Payer: Medicare Other

## 2011-06-01 LAB — GLUCOSE, CAPILLARY
Glucose-Capillary: 164 mg/dL — ABNORMAL HIGH (ref 70–99)
Glucose-Capillary: 150 mg/dL — ABNORMAL HIGH (ref 70–99)
Glucose-Capillary: 131 mg/dL — ABNORMAL HIGH (ref 70–99)

## 2011-06-01 MED ORDER — MILK AND MOLASSES ENEMA
Freq: Once | RECTAL | Status: AC
  Administered 2011-06-01: 250 mL via RECTAL
  Filled 2011-06-01: qty 250

## 2011-06-01 MED ORDER — METHYLPHENIDATE HCL 5 MG PO TABS
5.0000 mg | ORAL_TABLET | Freq: Two times a day (BID) | ORAL | Status: AC
  Administered 2011-06-01 – 2011-06-06 (×9): 5 mg via ORAL
  Filled 2011-06-01 (×9): qty 1

## 2011-06-01 MED ORDER — METOCLOPRAMIDE HCL 5 MG PO TABS
5.0000 mg | ORAL_TABLET | Freq: Three times a day (TID) | ORAL | Status: DC
  Administered 2011-06-01 – 2011-06-03 (×6): 5 mg via ORAL
  Filled 2011-06-01 (×8): qty 1

## 2011-06-01 MED ORDER — TRAVASOL 10 % IV SOLN: INTRAVENOUS | Status: DC

## 2011-06-01 NOTE — Progress Notes (Addendum)
Alert, responsive & Orient x3. Pt. Self suctioned with thickand white secretions.  No respiratory distress noted.Spo2  98 on RA;  auculated lung sounds- rhonchi in upper lung lobes. Pt. Remain out of bed to chair. Endorsement given to primary nurse for follow up.[name] Student Nurse NACT/Arlinda Luiz Ochoa.

## 2011-06-01 NOTE — Progress Notes (Signed)
TPN decreased to 80ml/hr from 24ml/hr as recommended by Terry/Pharmacy. Will d/c as ordered by Dr Darnelle Catalan in 4hrs at aprox. 1530 today

## 2011-06-01 NOTE — Progress Notes (Signed)
Diet as tolerated

## 2011-06-01 NOTE — Progress Notes (Addendum)
Milk of Molasses enema admin as ordered. Pt tolerated well . Mixed in of warm tap water for better flow of viscous molasses. Will monitor through out the day for bm.  Small brown bm results after enema x1 at 1100 am

## 2011-06-01 NOTE — Progress Notes (Signed)
Physical Therapy Treatment Patient Details Name: Regina West MRN: [id number] DOB: [birth year] Today's Date: 06/01/2011  PT Assessment/Plan  PT - Assessment/Plan Comments on Treatment Session: pt deconditioned with decreased activity tolerance, but improving.  She will benefit from HHPT and RW at D/C PT Plan: Discharge plan remains appropriate;Frequency remains appropriate PT Frequency: Min 3X/week Recommendations for Other Services: OT consult Follow Up Recommendations: Home health PT;Supervision/Assistance - 24 hour (initially  24 hour care) Equipment Recommended: Rolling walker with 5" wheels PT Goals  Acute Rehab PT Goals PT Goal: Sit to Stand - Progress: Progressing toward goal PT Goal: Stand to Sit - Progress: Progressing toward goal PT Goal: Ambulate - Progress: Progressing toward goal  PT Treatment Precautions/Restrictions  Precautions Precautions: Fall Precaution Comments: NG tube Required Braces or Orthoses: No Restrictions Weight Bearing Restrictions: No Mobility (including Balance) Bed Mobility Bed Mobility: Yes Sit to Supine: 5: Supervision Transfers Transfers: Yes Sit to Stand: 5: Supervision Stand to Sit: 5: Supervision Ambulation/Gait Ambulation/Gait: Yes Ambulation/Gait Assistance: 4: Min assist Ambulation/Gait Assistance Details (indicate cue type and reason):  pt needs freqeunt cues for posture as she tends to keep forward head, flexed posture, but she is able to correct. She also needed some assist with direction of RW Ambulation Distance (Feet): 400 Feet Assistive device: Rolling walker Gait Pattern: Step-through pattern;Decreased stride length Gait velocity: still slow, but improved Stairs: No Wheelchair Mobility Wheelchair Mobility: No  Posture/Postural Control Posture/Postural Control: No significant limitations Balance Balance Assessed: No Exercise  Other Exercises Other Exercises: standing balance with core activation to increase  strength and endurance of upright posture End of Session PT - End of Session Equipment Utilized During Treatment: Gait belt Activity Tolerance: Patient limited by fatigue (pt also with frequent coughing, occasionally productive) Patient left: in bed;with call bell in reach Nurse Communication: Mobility status for ambulation General Behavior During Session: [facility] for tasks performed Cognition: [facility] for tasks performed  Donnetta Hail 06/01/2011, 2:58 PM

## 2011-06-01 NOTE — Progress Notes (Signed)
Subjective: Up in chair, she thinks she feels better.  +BM and flatus  Objective: Vital signs in last 24 hours: Temp:  [98.1 F (36.7 C)-98.7 F (37.1 C)] 98.6 F (37 C) (03/27 0900) Pulse Rate:  [85-96] 85  (03/27 0900) Resp:  [17-18] 18  (03/27 0900) BP: (135-144)/(57-87) 144/57 mmHg (03/27 0900) SpO2:  [97 %-98 %] 98 % (03/27 0900) Last BM Date: 06/01/11  Intake/Output from previous day: 03/26 0701 - 03/27 0700 In: 240 [P.O.:240] Out: 1400 [Urine:1400] Intake/Output this shift: Total I/O In: 240 [P.O.:240] Out: 300 [Urine:300]  General appearance: fatigued and no distress GI: soft, non-tender; bowel sounds normal; no masses,  no organomegaly  Lab Results:   Basename 05/30/11 0400  WBC 5.5  HGB 10.9*  HCT 31.7*  PLT PLATELET CLUMPS NOTED ON SMEAR, COUNT APPEARS ADEQUATE    BMET  Basename 05/30/11 0400  NA 134*  K 3.9  CL 102  CO2 24  GLUCOSE 166*  BUN 29*  CREATININE 0.78  CALCIUM 9.1   PT/INR No results found for this basename: LABPROT:2,INR:2 in the last 72 hours   Lab 05/30/11 0400 05/29/11 1125 05/27/11 0402 05/26/11 0410  AST 28 20 17 17   ALT 19 14 15 17   ALKPHOS 94 82 84 98  BILITOT 0.6 0.5 0.3 0.4  PROT 5.4* 5.8* 5.7* 6.1  ALBUMIN 2.5* 2.8* 2.9* 3.0*     Lipase     Component Value Date/Time   LIPASE 35 05/23/2011 2017     Studies/Results: Dg Abd 1 View  06/01/2011  *RADIOLOGY REPORT*  Clinical Data: Follow up small bowel obstruction  ABDOMEN - 1 VIEW  Comparison: 05/31/2011  Findings: Degenerative changes lumbar spine again noted.  Again noted gaseous distended small bowel loops left mid abdomen consistent with partial small bowel obstruction.  Moderate stool in the rectosigmoid colon.  IMPRESSION: Again noted gaseous distended small bowel loops left mid abdomen consistent with partial small bowel obstruction.  Original Report Authenticated By: Natasha Mead, M.D.   Dg Abd 1 View  05/31/2011  *RADIOLOGY REPORT*  Clinical Data: Follow up  SBO  ABDOMEN - 1 VIEW  Comparison: 05/30/2011  Findings: Stable moderately dilated loops of small bowel in the left mid abdomen.  Decreased overall stool burden.  Colonic diverticulosis.  Degenerative changes of the visualized thoracolumbar spine.  IMPRESSION: Stable moderately dilated loops of small bowel in the left mid abdomen, compatible with partial small bowel obstruction.  Decreased overall stone burden.  Original Report Authenticated By: Charline Bills, M.D.    Medications:    . antiseptic oral rinse  15 mL Mouth Rinse BID  . insulin aspart  0-20 Units Subcutaneous Q4H  . insulin glargine  15 Units Subcutaneous BID  . levetiracetam  250 mg Intravenous Q12H  . methylphenidate  5 mg Oral BID WC  . metoCLOPramide  5 mg Oral TID AC  . milk and molasses   Rectal Once  . pantoprazole (PROTONIX) IV  40 mg Intravenous Q24H  . scopolamine  1 patch Transdermal Q72H  . 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  +  BM.  Hoping to go home soon  Please call if we can help. Agree with Dr. Darnelle Catalan, if she obstructs again follow up with [facility].     LOS: 9 days    Regina West 06/01/2011

## 2011-06-01 NOTE — Progress Notes (Signed)
Regina West   DOB:11-26-1929   WU#:[id number]   GNF#:[id number]  Subjective: Regina West is feeling a bit more positive this morning; her daughter-in-law is in the room with her. Regina West is not having significant problems with nausea or vomiting even though her NG tube was pulled yesterday morning. She has had 2 further small bowel movements since yesterday. She denies abdominal cramping or pain. She does not have any appetite. She is not getting out of bed as much as I would like and only walks twice yesterday. She feels very fatigued. She is encouraged though that the bowel obstruction seems to be somewhat improved and she is looking forward to possibly going home in the next 24-48 hours   Objective:  Filed Vitals:   06/01/11 0455  BP: 135/72  Pulse: 89  Temp: 98.7 F (37.1 C)  Resp: 18    Body mass index is 25.18 kg/(m^2).  Intake/Output Summary (Last 24 hours) at 06/01/11 0854 Last data filed at 06/01/11 0456  Gross per 24 hour  Intake    240 ml  Output   1400 ml  Net  -1160 ml     Sclerae unicteric  Oropharynx clear, tongue slightly coated  No peripheral adenopathy  Lungs clear -- no rales or rhonchi  Heart regular rate and rhythm  Abdomen minimal BS, NT, no masses palpated  MSK no focal spinal tenderness  Neuro nonfocal    CBG (last 3)   Basename 06/01/11 0719 06/01/11 0403 05/31/11 2354  GLUCAP 150* 164* 146*     Labs:  Lab Results  Component Value Date   WBC 5.5 05/30/2011   HGB 10.9* 05/30/2011   HCT 31.7* 05/30/2011   MCV 94.6 05/30/2011   PLT PLATELET CLUMPS NOTED ON SMEAR, COUNT APPEARS ADEQUATE 05/30/2011   NEUTROABS 4.5 05/30/2011     Lab 05/30/11 0400 05/29/11 1125 05/28/11 0650 05/27/11 0402 05/26/11 0410  NA 134* 134* 133* 136 131*  K 3.9 3.1* 3.9 3.2* 3.8  CL 102 98 95* 101 97  CO2 24 26 28 24 23   GLUCOSE 166* 151* 208* 157* 299*  BUN 29* 28* 21 18 17   CREATININE 0.78 0.82 0.92 0.90 0.96  CALCIUM 9.1 8.8 9.3 9.1 9.2  MG 1.6 1.5 1.4* -- 1.8     Urine Studies No results found for this basename: UACOL:2,UAPR:2,USPG:2,UPH:2,UTP:2,UGL:2,UKET:2,UBIL:2,UHGB:2,UNIT:2,UROB:2,ULEU:2,UEPI:2,UWBC:2,URBC:2,UBAC:2,CAST:2,CRYS:2,UCOM:2,BILUA:2 in the last 72 hours     Studies:  Dg Abd 1 View  06/01/2011  *RADIOLOGY REPORT*  Clinical Data: Follow up small bowel obstruction  ABDOMEN - 1 VIEW  Comparison: 05/31/2011  Findings: Degenerative changes lumbar spine again noted.  Again noted gaseous distended small bowel loops left mid abdomen consistent with partial small bowel obstruction.  Moderate stool in the rectosigmoid colon.  IMPRESSION: Again noted gaseous distended small bowel loops left mid abdomen consistent with partial small bowel obstruction.  Original Report Authenticated By: Natasha Mead, M.D.   Dg Abd 1 View  05/31/2011  *RADIOLOGY REPORT*  Clinical Data: Follow up SBO  ABDOMEN - 1 VIEW  Comparison: 05/30/2011  Findings: Stable moderately dilated loops of small bowel in the left mid abdomen.  Decreased overall stool burden.  Colonic diverticulosis.  Degenerative changes of the visualized thoracolumbar spine.  IMPRESSION: Stable moderately dilated loops of small bowel in the left mid abdomen, compatible with partial small bowel obstruction.  Decreased overall stone burden.  Original Report Authenticated By: Charline Bills, M.D.    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 After these problems resolved she received 2 additional cycles of single-agent carboplatin completed in March 2011   (2) She had her first recurrence February 2012 treated with carboplatin and Gemzar for a total of 6  cycles between February and June 2012. Her CEA-125 had normalized before the beginning of cycle 5.   (3) now in second recurrence, currently day 14 cycle 1 carboplatin/doxil, with good tolerance   (4) SBO diagnosed by CT 03/18 with transition point near terminal ileum, improved but not resolved  (5) malnutrition    Plan: I have strongly encouraged her to get out of bed and walk around as much as she can't today. I am adding methylphenidate to assist with her fatigue. I am discontinuing the TNA and advancing diet as tolerated. If she is doing considerably better by tomorrow, we can consider discharge. Her daughter from out-of-town is going to be in for the next 10 days. I think it would be useful though to have some home health if the patient stays at home, although she is thinking perhaps of going to the beach with her daughter  Dr. Donell Beers suggested that if the patient were to need surgery this should be done at [facility] under gynecologic oncology. Accordingly if the patient does develop a recurrent obstruction I will suggest that she go to [facility] and be evaluated there for possible surgery. Hopefully though that will not be necessary  Jenny Lai C 06/01/2011

## 2011-06-02 ENCOUNTER — Inpatient Hospital Stay (HOSPITAL_COMMUNITY): Payer: Medicare Other

## 2011-06-02 MED ORDER — METOCLOPRAMIDE HCL 5 MG PO TABS: 5.0000 mg | ORAL_TABLET | Freq: Three times a day (TID) | ORAL | Status: DC

## 2011-06-02 MED ORDER — BIOTENE DRY MOUTH MT LIQD: 15.0000 mL | Freq: Two times a day (BID) | OROMUCOSAL | Status: AC

## 2011-06-02 MED ORDER — METHYLPHENIDATE HCL 5 MG PO TABS: 5.0000 mg | ORAL_TABLET | Freq: Two times a day (BID) | ORAL | Status: DC

## 2011-06-02 NOTE — Progress Notes (Signed)
Alert & responsive, clear speech, assisted with ambulation to hallway. Encouraged use of incentive spirometer. Pt. performed usage of incentive spirometer with goal up to 1,000. Endorsement to primary nurse for additional monitoring. Roselyn Reef, Student Nurse,NCAT/Arlinda Luiz Ochoa

## 2011-06-02 NOTE — Discharge Summary (Signed)
Physician Discharge Summary  Patient ID: Regina West MRN: [id number] DOB/AGE: 1929-08-09 78 y.o.  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      levETIRAcetam 500 MG tablet   Commonly known as: KEPPRA   Take 250 mg by mouth every 12 (twelve) hours. Pt takes 1/2 tab for 250 mg dose      lidocaine-prilocaine cream   Commonly known as: EMLA   Apply 1 application topically as needed.      ondansetron 8 MG disintegrating tablet   Commonly known as: ZOFRAN-ODT   Take 8 mg by mouth every 8 (eight) hours as needed. nausea      PRESCRIPTION MEDICATION   Pt gets chemo at [facility]. Last treatment was on 05-16-11 followed by Dr Bevelyn Buckles      prochlorperazine 10 MG tablet   Commonly known as: COMPAZINE   Take 1 tablet (10 mg total) by mouth every 6 (six) hours as needed.             Disposition and Follow-up:  The patient will go to her home accompanied by her daughter, who is coming from out of town. The patient has a followup appointment in our office April 8 at 1:45 PM  Significant Diagnostic Studies:  Dg Abd 1 View  06/02/2011  *RADIOLOGY REPORT*  Clinical Data: Small bowel obstruction.  ABDOMEN - 1 VIEW  Comparison: 06/01/2011  Findings: Small bowel dilatation in the left abdomen is similar to prior study.  Increasing gas within the colon.  No free air organomegaly.   Degenerative changes in the lumbar spine and hips.  IMPRESSION: Continued gaseous distention of small bowel loops in the left abdomen.  Increasing colonic gas since prior study.  Original Report Authenticated By: Cyndie Chime, M.D.   Dg Abd 1 View  06/01/2011  *RADIOLOGY REPORT*  Clinical Data: Follow up small bowel obstruction  ABDOMEN - 1 VIEW  Comparison: 05/31/2011  Findings: Degenerative changes lumbar spine again noted.  Again noted gaseous distended small bowel loops left mid abdomen consistent with partial small bowel obstruction.  Moderate stool in the rectosigmoid colon.  IMPRESSION: Again noted gaseous distended small bowel loops left mid abdomen consistent with partial small bowel obstruction.  Original Report Authenticated By: Natasha Mead, M.D.   Dg Abd 1 View  05/31/2011  *RADIOLOGY REPORT*  Clinical Data: Follow up SBO  ABDOMEN - 1 VIEW  Comparison: 05/30/2011  Findings: Stable moderately dilated loops of small bowel in the left mid abdomen.  Decreased overall stool burden.  Colonic diverticulosis.  Degenerative changes of the visualized thoracolumbar spine.  IMPRESSION: Stable moderately dilated loops of small bowel in the left mid abdomen, compatible with partial small bowel obstruction.  Decreased overall stone burden.  Original Report Authenticated By: Charline Bills, M.D.   Dg Abd 1 View  05/29/2011  *RADIOLOGY REPORT*  Clinical Data: Evaluate small bowel  obstruction  ABDOMEN - 1 VIEW  Comparison: 05/28/2011; 05/27/2011; 05/26/2011; abdominal CT - 05/23/2011; chest radiograph - 05/28/2011  Findings:  No change to minimal worsening of gaseous distension of multiple loops of small bowel with index loop in the mid hemiabdomen now measuring 5.6 cm in diameter, previously, 4.8 cm.  Evaluation for pneumoperitoneum is limited secondary to supine patient positioning.  Limited visualization of the lower thorax demonstrates a portion of the enteric tube within a large hiatal hernia.  Previously  ingested enteric contrast remains within the distal small bowel.  There is a conspicuous paucity of distal colonic gas.  Multilevel lumbar spine degenerative change.  IMPRESSION: 1.  No change to minimal worsening of findings compatible with high- grade partial small bowel obstruction. 2.  Enteric tube remains within the herniated portion of the previously identified hiatal herni.  Repositioning may be considered as clinically indicated.  Original Report Authenticated By: Waynard Reeds, M.D.   Dg Abd 1 View  05/28/2011  *RADIOLOGY REPORT*  Clinical Data: Abdominal pain and distention.  History of small bowel obstruction.  Constipation.  ABDOMEN - 1 VIEW  Comparison: 1 day prior  Findings: Single supine view.  Nasogastric tube only partially imaged.  The tip is likely within the hiatal hernia.  No gross free intraperitoneal air.  Gas filled small bowel loops measure up to 4.8 cm.  No pneumatosis.  Gas and contrast within normal caliber colon.  Distal gas identified.  Phleboliths in the pelvis.  Probable vascular or cartilaginous calcifications over the right upper quadrant.  IMPRESSION: Slight decrease in small bowel dilatation.  Either a mild adynamic ileus or low grade partial small bowel obstruction.  Original Report Authenticated By: Consuello Bossier, M.D.   Dg Abd 1 View  05/27/2011  *RADIOLOGY REPORT*  Clinical Data: Small bowel obstruction.  ABDOMEN - 1 VIEW  Comparison: 05/26/2011 11/08/2011  Findings: Single portable view of the abdomen was obtained.  Again noted are dilated loops of small bowel in the left abdomen. Limited evaluation for free air on this examination.  Evidence for stool and some gas within the colon. A nasogastric tube is not seen within the abdomen.  Small bowel loops measure up to 5.4 cm, suggesting that there may be increased small bowel distention.  IMPRESSION: Persistent dilatation of small bowel loops, predominately along the left side of the abdomen.  There is concern for  increased small bowel distention.  Limited evaluation for free air on this examination.  Original Report Authenticated By: Richarda Overlie, M.D.   Dg Abd 1 View  05/25/2011  *RADIOLOGY REPORT*  Clinical Data: Small bowel obstruction, follow-up  ABDOMEN - 1 VIEW  Comparison: Abdomen films of 05/24/2011  Findings: There are persistently dilated small bowel loops consistent with partial small bowel obstruction.  No colonic distention is seen although some air is noted within the rectum. No opaque calculi are seen.  IMPRESSION: Persistent gaseous distention of small bowel consistent with partial small bowel obstruction.  Original Report Authenticated By: Juline Patch, M.D.   Ct Abdomen Pelvis W Contrast  05/23/2011  *RADIOLOGY REPORT*  Clinical Data: Evaluate for small bowel obstruction.  CT ABDOMEN AND PELVIS WITH CONTRAST  Technique:  Multidetector CT imaging of the abdomen and pelvis was performed following the standard protocol during bolus administration of intravenous contrast.  Contrast: OMNIPAQUE IOHEXOL 300 MG/ML IJ SOLN  Comparison: 05/02/2011  Findings: There are bilateral pleural effusions, right greater than left.  Atelectasis is noted within both lung bases.  The patient has a very large hiatal hernia.  The stomach and the small bowel loops are unremarkable.  The adrenal glands are normal. Gallbladder appears normal.  There is no biliary dilatation.  There is moderate atrophy of the pancreatic parenchyma.  Normal appearance of both kidneys.  No enlarged upper abdominal lymph nodes.  There is no pelvic or inguinal adenopathy.  The urinary bladder appears normal.  There is a small amount of free fluid within the pelvis.  Marked increased caliber of the proximal small bowel loops are noted measuring up to 4.1 cm.  Transition to decreased caliber small bowel loops is noted within the central portion of the abdomen, image 67.  The terminal ileum is collapsed.  There is a moderate amount of stool  identified within the proximal colon.  Extensive sigmoid diverticulosis is noted without acute inflammation.  There is no evidence for bowel perforation.  No abscess formation is identified.  Small umbilical hernia contains fat only.  Review of the visualized osseous structures is significant for mild scoliosis and multilevel spondylosis.  There is a retrolisthesis of L1 on L2 and L2 on L3.  Extensive facet degenerative changes noted.  IMPRESSION:  1.  Examination is positive for high-grade small bowel obstruction. The transition point is proximal to the terminal ileum within the central abdomen, image 67 of the axial series. 2.  Very large hiatal hernia.  3.  Bilateral pleural effusions, right greater than left.  Original Report Authenticated By: Rosealee Albee, M.D.   US Abdomen Limited  05/18/2011  Clinical data:  Ovarian carcinoma;   abdominal distention; request is made for therapeutic paracentesis.  LIMITED ULTRASOUND ABDOMEN   05/18/2011  Technique:  Limited ultrasound of the abdomen and pelvis in all four quadrants today revealed only trace amount of ascites. Paracentesis was not performed at this time.  Dr. Darnelle Catalan  was notified of the above findings.  Impression:  Trace amount of ascites.  Paracentesis was not performed at this time.  Read by: Jeananne Rama, P.A.-C  Original Report Authenticated By: Judie Petit. Ruel Favors, M.D.   Dg Chest Port 1 View  05/28/2011  *RADIOLOGY REPORT*  Clinical Data: Cough.  History of ovarian cancer and small bowel obstruction.  PORTABLE CHEST - 1 VIEW 05/28/2011 1105 hours:  Comparison: Two-view chest x-ray 05/02/2011 [facility] and one-view chest x-ray 06/01/2009 [facility].  Findings: Nasogastric tube looped in the stomach which is within the hiatal hernia above the diaphragm.  Cardiac silhouette normal in size, unchanged.  Thoracic aorta tortuous atherosclerotic, unchanged.  Hilar and mediastinal contours otherwise unremarkable. Mild atelectasis in  the lung bases.  Lungs otherwise clear.  Right jugular Port-A-Cath tip remains in the SVC.  IMPRESSION: Mild bibasilar atelectasis.  No acute cardiopulmonary disease otherwise.  Nasogastric tube looped in the stomach which is within the hiatal hernia above the diaphragm.  Original Report Authenticated By: Arnell Sieving, M.D.   Dg Abd 2 Views  05/26/2011  *RADIOLOGY REPORT*  Clinical Data:  ABDOMEN - 2 VIEW  Comparison: None.  Findings: NG tube placement NG tube is coiled within a large hiatal hernia above the diaphragm.  There is interval decrease in size of dilated loops of small bowel which measures 3 cm compared to 4 cm on prior.  There are fluid levels in the small bowel.  There is gas within the rectosigmoid colon.  No free air on the left lateral decubitus view.  IMPRESSION:  1.  NG tube coiled within a  large sliding type hiatal hernia above the diaphragm. 2.  Interval decrease in size of dilated loops of small bowel. 3.  Gas within the rectum. 4.  No free air.  Original Report Authenticated By: Genevive Bi, M.D.   Dg Abd 2 Views  05/24/2011  *RADIOLOGY REPORT*  Clinical Data: Abdominal distention.  Nasogastric tube placed.  ABDOMEN - 2 VIEW  Comparison: CT abdomen pelvis and acute abdominal series 05/23/2011 .  Findings: The lung bases and upper aspect of the abdomen are not included on these images.  Distal aspect of the nasogastric tube projects over the expected location of the proximal stomach.  Right side up decubitus view is submitted.  Evaluation for free air is limited by overexposure of the far lateral right abdomen.  No gross evidence of free air is seen.  There are air-fluid levels and multiple dilated loops of small bowel.  Small bowel dilatation measures up to least 4.5 cm.  There is stool and a small amount of gas visualized in the proximal colon.  Oral contrast material is seen in the visualized portion of the upper urinary bladder (inferior pelvis not completely included on the  image). This contrast is from CT abdomen pelvis performed yesterday.  IMPRESSION:  1.  Persistent high-grade small bowel obstruction pattern. 2.  No gross evidence of free intraperitoneal air. 3.  Nasogastric tube terminates in the proximal stomach (the stomach is not completely included on this image.  Original Report Authenticated By: Britta Mccreedy, M.D.   Dg Abd Acute W/chest  05/23/2011  *RADIOLOGY REPORT*  Clinical Data: Pain and weakness.  ACUTE ABDOMEN SERIES (ABDOMEN 2 VIEW & CHEST 1 VIEW)  Comparison: None.  Findings: Right greater than left pleural effusions.  No infiltrates or nodules.  Cardiomegaly.  Calcified tortuous aorta. Large fluid-filled hiatal hernia.  Dilated small bowel loops with air-fluid levels suggesting partial obstruction.  Moderate stool burden.  Degenerative change lumbar spine and hips. Small bowel obstruction was not present on previous CT.  IMPRESSION: Partial small bowel obstruction is suspected.  Right greater than left pleural effusions with large hiatal hernia.  Original Report Authenticated By: Elsie Stain, M.D.   Dg Abd Portable 1v  05/30/2011  *RADIOLOGY REPORT*  Clinical Data: Small bowel obstruction.  Increased nausea vomiting.  PORTABLE ABDOMEN - 1 VIEW  Comparison: 05/29/2011  Findings: 0520 hours.  Supine film again shows some gaseous small bowel distention in the left abdomen.  A small bowel loops measure up to 4.822 cm in diameter today compared to 5.6 cm in maximum diameter previously.  IMPRESSION: No substantial interval change.  The patient does have some persistent dilated small bowel loops in the left abdomen.  Original Report Authenticated By: ERIC A. MANSELL, M.D.   Dg Naso G Tube Plc W/fl W/rad  05/27/2011  Fluoroscopically guided nasogastric tube placement.  Clinical history:  Small bowel obstruction.  Hiatal hernia.  Fluoroscopic time 2.16 minutes.  Procedure:  18-French nasogastric tube was placed under fluoroscopy.  The patient has a hiatal  hernia.  The tip of the tube was left within the stomach above the hemidiaphragm on the left. Large volume of fluid emanated from the tube after placement.  Impression:  Successful NG tube placement with the tip left within the patient's hiatal hernia.  Original Report Authenticated By: Bernadene Bell. D'ALESSIO, M.D.   Dg Vangie Bicker G Tube Plc Lanell Persons W/rad  05/24/2011  Fluoroscopically guided nasogastric tube placement.  Clinical history:  Small bowel obstruction.  Hiatal hernia.  Fluoroscopic time:  2.26 minutes.  Procedure:  After failed attempt at placing nasogastric tube without fluoroscopic guidance, fluoroscopically guided placement was requested.  Nasogastric tube was placed into the hiatal hernia and became curled within such.  This was not able to be advanced. As there was drainage of green bilious material, nasogastric tube was left in this position.  Impression:  Nasogastric tube curled within the hiatal hernia.  Original Report Authenticated By: Fuller Canada, M.D.    Discharge Laboratory Values: @cprlabs @  Hospital course: For complete details please refer to admission H and P, but in brief, the patient was admitted with a high-grade small bowel obstruction under the care of Dr. Lorain Childes. She came under our service the following day, and surgery was consulted. I also contacted the patient's gynecologic oncologist, Dr. Ivar Drape from [facility] regarding the patient's admission. The patient was treated conservatively, and in the NG tube was placed to continuous suction, she was hydrated, her electrolyte imbalances were corrected, and her malnutrition was treated with TNA given through her port. The patient was encouraged to ambulate but she was very fatigued and for many days her activity was minimal. Over the last 4 days the patient has been more active. 2 days ago she had a bowel movement, and at that point her NG was removed and she was started on clear liquids. Her diet has been advanced and at this point  the patient is taking a full liquids well. She has minimal to no nausea. No vomiting, and essentially no abdominal pain. She is passing gas. She remains weak and only moderately ambulatory with assistance. We are trying to obtain a walker for the patient at to have at home. Our expectation is that the patient will be ready for discharge on the morning of 06/03/2011  Physical Exam at the time of this dictation: BP 125/70  Pulse 78  Temp(Src) 98 F (36.7 C) (Oral)  Resp 20  Ht 5\' 6"  (1.676 m)  Wt 156 lb (70.761 kg)  BMI 25.18 kg/m2  SpO2 95% Gen: Alert, supine in bed, slightly confused but oriented x3 Cardiovascular regular rate and rhythm no murmur appreciated Respiratory: No rales or rhonchi Gastrointestinal: Mild distention of the abdomen, decreased but present bowel sounds, no tenderness to palpation   Principal Problem:  *Small bowel obstruction Active Problems:  Ovarian cancer  Seizure disorder  Hyponatremia   Diet:  As tolerated  Activity:  Fall precautions  Condition at Discharge:   Improved  Signed: Dr. Ruthann Cancer [phone]  06/02/2011, 8:47 AM

## 2011-06-03 ENCOUNTER — Inpatient Hospital Stay (HOSPITAL_COMMUNITY): Payer: Medicare Other

## 2011-06-03 DIAGNOSIS — R1114 Bilious vomiting: Secondary | ICD-10-CM

## 2011-06-03 LAB — COMPREHENSIVE METABOLIC PANEL
AST: 49 U/L — ABNORMAL HIGH (ref 0–37)
BUN: 22 mg/dL (ref 6–23)
CO2: 26 mEq/L (ref 19–32)
Calcium: 8.9 mg/dL (ref 8.4–10.5)
Chloride: 100 mEq/L (ref 96–112)
Creatinine, Ser: 1.02 mg/dL (ref 0.50–1.10)
GFR calc Af Amer: 59 mL/min — ABNORMAL LOW (ref >90)
GFR calc non Af Amer: 51 mL/min — ABNORMAL LOW (ref >90)
Glucose, Bld: 77 mg/dL (ref 70–99)
Total Bilirubin: 0.7 mg/dL (ref 0.3–1.2)

## 2011-06-03 LAB — CBC
Hemoglobin: 9.8 g/dL — ABNORMAL LOW (ref 12.0–15.0)
MCH: 32.2 pg (ref 26.0–34.0)
MCHC: 33.9 g/dL (ref 30.0–36.0)
MCV: 95.1 fL (ref 78.0–100.0)

## 2011-06-03 LAB — DIFFERENTIAL
Basophils Relative: 0 % (ref 0–1)
Eosinophils Absolute: 0 10*3/uL (ref 0.0–0.7)
Lymphs Abs: 0.6 10*3/uL — ABNORMAL LOW (ref 0.7–4.0)
Monocytes Absolute: 0.1 10*3/uL (ref 0.1–1.0)
Neutrophils Relative %: 75 % (ref 43–77)

## 2011-06-03 MED ORDER — METOCLOPRAMIDE HCL 10 MG PO TABS
10.0000 mg | ORAL_TABLET | Freq: Three times a day (TID) | ORAL | Status: DC
  Administered 2011-06-03 – 2011-06-06 (×8): 10 mg via ORAL
  Filled 2011-06-03 (×9): qty 1

## 2011-06-03 MED ORDER — PROMETHAZINE HCL 25 MG/ML IJ SOLN
25.0000 mg | Freq: Four times a day (QID) | INTRAMUSCULAR | Status: DC | PRN
  Administered 2011-06-03 – 2011-06-06 (×3): 25 mg via INTRAVENOUS
  Filled 2011-06-03 (×3): qty 1

## 2011-06-03 NOTE — Progress Notes (Addendum)
Nutrition Brief Note  - Attempted to meet with pt to see how pt was tolerating diet, however pt was coughing multiple times and had what appeared to be small amount of vomiting on shirt - pt reports "I keep vomiting bile". Notified RN. RN will continue to monitor.   Dietitian # [phone]

## 2011-06-03 NOTE — Progress Notes (Signed)
Regina West   DOB:[birth year]   RU#:[id number]   BJY#:[id number]  Subjective:Regina West is not doing as well today. She is a little more nauseated  And is attempting small amounts of liquids. She denies pain. She has been ambulating. Her bowels have been moving small amounts.  Objective:  Filed Vitals:   06/03/11 1055  BP: 130/84  Pulse: 87  Temp:   Resp: 18    Body mass index is 25.18 kg/(m^2).  Intake/Output Summary (Last 24 hours) at 06/03/11 1403 Last data filed at 06/03/11 0905  Gross per 24 hour  Intake    660 ml  Output      0 ml  Net    660 ml     Sclerae unicteric  Oropharynx clear, tongue slightly coated  No peripheral adenopathy  Lungs clear -- no rales or rhonchi  Heart regular rate and rhythm  Abdomen minimal BS, NT, no masses palpated, sl distended , no rebound  MSK no focal spinal tenderness  Neuro nonfocal    CBG (last 3)   Basename 06/01/11 1542 06/01/11 1159 06/01/11 0719  GLUCAP 64* 131* 150*     Labs:  Lab Results  Component Value Date   WBC 2.9* 06/03/2011   HGB 9.8* 06/03/2011   HCT 28.9* 06/03/2011   MCV 95.1 06/03/2011   PLT 95* 06/03/2011   NEUTROABS 2.2 06/03/2011     Lab 06/03/11 0403 05/30/11 0400 05/29/11 1125 05/28/11 0650  NA 134* 134* 134* 133*  K 4.3 3.9 3.1* 3.9  CL 100 102 98 95*  CO2 26 24 26 28   GLUCOSE 77 166* 151* 208*  BUN 22 29* 28* 21  CREATININE 1.02 0.78 0.82 0.92  CALCIUM 8.9 9.1 8.8 9.3  MG -- 1.6 1.5 1.4*    Urine Studies No results found for this basename: UACOL:2,UAPR:2,USPG:2,UPH:2,UTP:2,UGL:2,UKET:2,UBIL:2,UHGB:2,UNIT:2,UROB:2,ULEU:2,UEPI:2,UWBC:2,URBC:2,UBAC:2,CAST:2,CRYS:2,UCOM:2,BILUA:2 in the last 72 hours     Studies:  Dg Abd 1 View  06/03/2011  *RADIOLOGY REPORT*  Clinical Data: A small bowel obstruction.  ABDOMEN - 1 VIEW  Comparison: 06/02/2011 and 06/01/2011.  Findings: 0505 hours. Examination was repeated due to motion. There is persistent small bowel distension in the left mid abdomen. No  pneumatosis is identified.  There is no supine evidence of free intraperitoneal air. The colon remains partially aerated.  The osseous structures appear unchanged.  IMPRESSION: Stable examination with suspicion of residual partial small-bowel obstruction.  Original Report Authenticated By: Gerrianne Scale, M.D.   Dg Abd 1 View  06/02/2011  *RADIOLOGY REPORT*  Clinical Data: Small bowel obstruction.  ABDOMEN - 1 VIEW  Comparison: 06/01/2011  Findings: Small bowel dilatation in the left abdomen is similar to prior study.  Increasing gas within the colon.  No free air organomegaly.  Degenerative changes in the lumbar spine and hips.  IMPRESSION: Continued gaseous distention of small bowel loops in the left abdomen.  Increasing colonic gas since prior study.  Original Report Authenticated By: Cyndie Chime, M.D.    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 After these problems resolved she received 2 additional cycles of single-agent carboplatin completed in March 2011   (2) She had her first recurrence February 2012 treated with carboplatin and Gemzar for a total of 6 cycles  between February and June 2012. Her CEA-125 had normalized before the beginning of cycle 5.   (3) now in second recurrence, currently day 14 cycle 1 carboplatin/doxil, with good tolerance   (4) SBO diagnosed by CT 03/18 with transition point near terminal ileum, improved but not resolved  (5) malnutrition    Plan   She is still having some problems related to SBO as exemplified by her xray. She is still having emesis. i spoke with dr Nedra Hai @ [facility] who said that if she is having  large volume bilious emesis, that she should have her NG replaced and be sent to [facility], where her Fellow will accept the transfer. Otherwise she can stay here. I spoke with her daughter as well who was planning on taking her to the beach. I recommended that the plan be placed on hold.  Dr Nedra Hai also recommended enemas to help clear her from below. We will keep her here for the w/e. :Teriana Danker 06/03/2011

## 2011-06-03 NOTE — Progress Notes (Signed)
No change from 0825 assessment. Continue present plan of care. Regina West

## 2011-06-03 NOTE — Progress Notes (Signed)
Physical Therapy Treatment Patient Details Name: Regina West MRN: [id number] DOB: May 21, 1929 Today's Date: 06/03/2011   10:05 - 10:30 1 gt  1 ta  PT Assessment/Plan  PT - Assessment/Plan Comments on Treatment Session: Pt feeling better and amb with nursing staff as well.  Pt plans to D/C to home. PT Plan: Discharge plan remains appropriate Follow Up Recommendations: Home health PT Equipment Recommended: Rolling walker with 5" wheels PT Goals  Acute Rehab PT Goals PT Goal Formulation: With patient Pt will go Sit to Stand: with modified independence PT Goal: Sit to Stand - Progress: Progressing toward goal Pt will go Stand to Sit: with modified independence PT Goal: Stand to Sit - Progress: Progressing toward goal Pt will Ambulate: >150 feet;with modified independence;with least restrictive assistive device PT Goal: Ambulate - Progress: Progressing toward goal  PT Treatment Precautions/Restrictions  Precautions Precautions: Fall Precaution Comments: NG tube Required Braces or Orthoses: No Restrictions Weight Bearing Restrictions: No Mobility (including Balance) Bed Mobility Bed Mobility: No (Pt OOB) Transfers Transfers: Yes Sit to Stand: 5: Supervision;From chair/3-in-1 Sit to Stand Details (indicate cue type and reason): increased time Stand to Sit: 5: Supervision;To chair/3-in-1 Stand to Sit Details: one VC to complete trun prior to sitting Ambulation/Gait Ambulation/Gait: Yes Ambulation/Gait Assistance: Other (comment) ([employer] Assist) Ambulation/Gait Assistance Details (indicate cue type and reason): 255 VC's on saftey with turns and backward gait completion prior to sit while maintaing proper walker to self distanc Ambulation Distance (Feet): 275 Feet Assistive device: Rolling walker Gait Pattern: Step-through pattern;Decreased stride length Gait velocity: no c/o pain, mild c/o weakness Stairs: No Wheelchair Mobility Wheelchair Mobility: No    Exercise    End of Session PT - End of Session Equipment Utilized During Treatment: Gait belt Activity Tolerance: Patient tolerated treatment well Patient left: in chair;with call bell in reach General Behavior During Session: [facility] for tasks performed Cognition: [facility] for tasks performed  Felecia Shelling  PTA [facility]  Acute  Rehab Pager     [phone]

## 2011-06-04 DIAGNOSIS — R110 Nausea: Secondary | ICD-10-CM

## 2011-06-04 LAB — CBC
HCT: 27.3 % — ABNORMAL LOW (ref 36.0–46.0)
MCH: 32.3 pg (ref 26.0–34.0)
MCV: 95.8 fL (ref 78.0–100.0)
Platelets: 96 10*3/uL — ABNORMAL LOW (ref 150–400)
RDW: 12.4 % (ref 11.5–15.5)

## 2011-06-04 LAB — DIFFERENTIAL
Basophils Absolute: 0 10*3/uL (ref 0.0–0.1)
Eosinophils Absolute: 0 10*3/uL (ref 0.0–0.7)
Eosinophils Relative: 1 % (ref 0–5)
Lymphocytes Relative: 23 % (ref 12–46)
Lymphs Abs: 0.5 10*3/uL — ABNORMAL LOW (ref 0.7–4.0)
Monocytes Absolute: 0.1 10*3/uL (ref 0.1–1.0)

## 2011-06-04 LAB — COMPREHENSIVE METABOLIC PANEL
AST: 30 U/L (ref 0–37)
Albumin: 2.5 g/dL — ABNORMAL LOW (ref 3.5–5.2)
Alkaline Phosphatase: 220 U/L — ABNORMAL HIGH (ref 39–117)
Chloride: 105 mEq/L (ref 96–112)
Potassium: 4.5 mEq/L (ref 3.5–5.1)
Sodium: 137 mEq/L (ref 135–145)
Total Bilirubin: 0.8 mg/dL (ref 0.3–1.2)
Total Protein: 5.4 g/dL — ABNORMAL LOW (ref 6.0–8.3)

## 2011-06-04 MED ORDER — DEXTROSE 5 % IV SOLN
250.0000 mg | INTRAVENOUS | Status: DC
  Administered 2011-06-04 – 2011-06-06 (×3): 250 mg via INTRAVENOUS
  Filled 2011-06-04 (×5): qty 250

## 2011-06-04 MED ORDER — VITAMINS A & D EX OINT
TOPICAL_OINTMENT | CUTANEOUS | Status: AC
  Administered 2011-06-04: 10
  Filled 2011-06-04: qty 10

## 2011-06-04 NOTE — Progress Notes (Signed)
Since Regina West is doing okay although she is very worse today. She is producing some purulent sputum. We will go ahead and get her on some Zithromax help. Taking some clear and full liquids. She's had little nausea but no vomiting. Is no palpable pain. She has a little cough. There is no bleeding. There is no leg swelling..  She did have a abdominal film yesterday. There is a stable exam with some suspicion of residual partial small bowel obstruction.  She is having bowel movements. She says she had one yesterday. Her laboratory studies look okay. Has some slight anemia and thrombocytopenia but again is asymptomatic. Her liver function tests looked okay.  Her physical exam shows stable vital signs. Blood pressure is 107/70. Her lungs are clear. She has some scattered rhonchi. Cardiac exam regular rate and rhythm without murmurs rubs or bruits. Of normal exam shows a soft abdomen with good bowel sounds. There is no rub abdominal distention. There is no guarding or rebound tenderness. No obvious abdominal masses noted. Extremities shows no clubbing cyanosis or edema.  We'll try her on some Zithromax. I'll get his IV for right now. I'll repeat another abdominal film on her on April 1.  We did pray for Regina West. She feels her faith has been tested but I reassured her.  James 1:12  Regina E.

## 2011-06-05 LAB — COMPREHENSIVE METABOLIC PANEL
ALT: 29 U/L (ref 0–35)
CO2: 24 mEq/L (ref 19–32)
Calcium: 9.3 mg/dL (ref 8.4–10.5)
Creatinine, Ser: 0.92 mg/dL (ref 0.50–1.10)
GFR calc Af Amer: 67 mL/min — ABNORMAL LOW (ref >90)
GFR calc non Af Amer: 58 mL/min — ABNORMAL LOW (ref >90)
Glucose, Bld: 126 mg/dL — ABNORMAL HIGH (ref 70–99)
Sodium: 135 mEq/L (ref 135–145)
Total Protein: 5.8 g/dL — ABNORMAL LOW (ref 6.0–8.3)

## 2011-06-05 LAB — DIFFERENTIAL
Lymphocytes Relative: 22 % (ref 12–46)
Lymphs Abs: 0.7 10*3/uL (ref 0.7–4.0)
Monocytes Absolute: 0.1 10*3/uL (ref 0.1–1.0)
Monocytes Relative: 4 % (ref 3–12)
Neutro Abs: 2.1 10*3/uL (ref 1.7–7.7)
Neutrophils Relative %: 73 % (ref 43–77)

## 2011-06-05 LAB — CBC
HCT: 29.3 % — ABNORMAL LOW (ref 36.0–46.0)
Hemoglobin: 9.7 g/dL — ABNORMAL LOW (ref 12.0–15.0)
RBC: 3.02 MIL/uL — ABNORMAL LOW (ref 3.87–5.11)

## 2011-06-05 NOTE — Progress Notes (Signed)
Patient tolerating full liquid diet and is requesting more food choices. Notified Dr. Clelia Croft for mechanical soft diet. Will continue to monitor patient

## 2011-06-05 NOTE — Progress Notes (Signed)
IP PROGRESS NOTE  Subjective:   Patient feels about the same. She tolerating small amounts of liquids without vomiting. No abdominal pain.   Objective:  Vital signs in last 24 hours: Temp:  [98.2 F (36.8 C)-99.1 F (37.3 C)] 99.1 F (37.3 C) (03/31 0425) Pulse Rate:  [86-96] 96  (03/31 0425) Resp:  [18] 18  (03/31 0425) BP: (106-131)/(71-86) 131/86 mmHg (03/31 0425) SpO2:  [93 %-97 %] 93 % (03/31 0425) Weight change:  Last BM Date: 06/03/11  Intake/Output from previous day: 03/30 0701 - 03/31 0700 In: 340 [P.O.:340] Out: -   Mouth: mucous membranes moist, pharynx normal without lesions Resp: clear to auscultation bilaterally Cardio: regular rate and rhythm, S1, S2 normal, no murmur, click, rub or gallop GI: Soft, non tender, very faint bowel sounds.  Extremities: extremities normal, atraumatic, no cyanosis or edema  Portacath/PICC-without erythema  Lab Results:  [facility] 06/05/11 0605 06/04/11 0515  WBC 3.0* 2.3*  HGB 9.7* 9.2*  HCT 29.3* 27.3*  PLT 106* 96*    BMET  Basename 06/05/11 0605 06/04/11 0515  NA 135 137  K 4.6 4.5  CL 104 105  CO2 24 25  GLUCOSE 126* 104*  BUN 11 15  CREATININE 0.92 0.89  CALCIUM 9.3 8.9    Studies/Results: No results found.  Medications: I have reviewed the patient's current medications.  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 She is most recently s/P carboplatin/doxil, with good tolerance. Treatment on hold for now till other issues resolve.   (2) SBO diagnosed by CT 03/18 with transition point near terminal ileum, improved but not resolved.The plan is continue supportive care with consideration of surgery and possible transfer to [facility]. Continue the current management for today.  No need for an NG tube.    (3) malnutrition: on TNA.   (4) Cough and congestion:  She is on day #2 of Azithromycin.          LOS: 13 days   Geralynn Capri 06/05/2011, 7:21 AM

## 2011-06-06 ENCOUNTER — Inpatient Hospital Stay (HOSPITAL_COMMUNITY): Payer: Medicare Other

## 2011-06-06 MED ORDER — MIRTAZAPINE 15 MG PO TBDP
15.0000 mg | ORAL_TABLET | Freq: Every day | ORAL | Status: DC
  Administered 2011-06-06 – 2011-06-07 (×2): 15 mg via ORAL
  Filled 2011-06-06 (×4): qty 1

## 2011-06-06 MED ORDER — MILK AND MOLASSES ENEMA
Freq: Once | RECTAL | Status: AC
  Administered 2011-06-06: 15:00:00 via RECTAL
  Filled 2011-06-06: qty 250

## 2011-06-06 MED ORDER — METOCLOPRAMIDE HCL 5 MG PO TABS
5.0000 mg | ORAL_TABLET | Freq: Three times a day (TID) | ORAL | Status: DC
  Administered 2011-06-06 – 2011-06-08 (×7): 5 mg via ORAL
  Filled 2011-06-06 (×11): qty 1

## 2011-06-06 NOTE — Progress Notes (Signed)
BRYSON PALEN   DOB:22-Jan-1930   NW#:[id number]   MVH#:[id number]  Subjective: Uzma is feeling down--considering "giving up," thinking of herself as a burden. She is doing a lot more walking--he daughter [name] "is a general", she says--but is tolerating some of her medicines poorly (apparently the phenergan caused confusion). She is anxious abut gong home and thinking that she will need significant support there. Denies pain, nausea currently, SOB, though she is bringing up a little phlegm  Objective:  Filed Vitals:   06/06/11 0525  BP: 126/72  Pulse: 99  Temp: 98.2 F (36.8 C)  Resp: 20    Body mass index is 25.18 kg/(m^2).  Intake/Output Summary (Last 24 hours) at 06/06/11 0857 Last data filed at 06/06/11 0525  Gross per 24 hour  Intake   3817 ml  Output      0 ml  Net   3817 ml     Sclerae unicteric  Oropharynx clear, no evidence of thrush  No peripheral adenopathy  Lungs clear -- no rales or rhonchi  Heart regular rate and rhythm  Abdomen minimal BS, NT, no masses palpated  MSK no focal spinal tenderness  Neuro nonfocal    CBG (last 3)  No results found for this basename: GLUCAP:3 in the last 72 hours   Labs:  Lab Results  Component Value Date   WBC 3.0* 06/05/2011   HGB 9.7* 06/05/2011   HCT 29.3* 06/05/2011   MCV 97.0 06/05/2011   PLT 106* 06/05/2011   NEUTROABS 2.1 06/05/2011     Lab 06/05/11 0605 06/04/11 0515 06/03/11 0403  NA 135 137 134*  K 4.6 4.5 4.3  CL 104 105 100  CO2 24 25 26   GLUCOSE 126* 104* 77  BUN 11 15 22   CREATININE 0.92 0.89 1.02  CALCIUM 9.3 8.9 8.9  MG -- -- --    Urine Studies No results found for this basename: UACOL:2,UAPR:2,USPG:2,UPH:2,UTP:2,UGL:2,UKET:2,UBIL:2,UHGB:2,UNIT:2,UROB:2,ULEU:2,UEPI:2,UWBC:2,URBC:2,UBAC:2,CAST:2,CRYS:2,UCOM:2,BILUA:2 in the last 72 hours     Studies:  Dg Abd 2 Views  06/06/2011  *RADIOLOGY REPORT*  Clinical Data: Partial small bowel obstruction.  Ovarian cancer.  ABDOMEN - 2 VIEW   Comparison: 03/29 and 06/02/2011 and a CT scan dated 05/23/2011  Findings: There is persistent dilatation of multiple small bowel loops in the left mid abdomen.  Distal small bowel and colon are not dilated.  Large hiatal hernia is noted.  No free air.  Small right pleural effusion.  IMPRESSION: Persistent small bowel obstruction.  No significant change since the prior study.  Original Report Authenticated By: Gwynn Burly, M.D.    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 After these problems resolved she received 2 additional cycles of single-agent carboplatin completed in March 2011   (2) She had her first recurrence February 2012 treated with carboplatin and Gemzar for a total of 6 cycles between February and June 2012. Her CEA-125 had normalized before the beginning of cycle 5.   (3) now in second recurrence, currently day 14 cycle 1 carboplatin/doxil, with good tolerance   (4) SBO diagnosed by CT 03/18 with transition point near terminal ileum, improved but not resolved  (5) malnutrition  (6) depression    Plan: I don't think Narcissus is ready for discharge--she  wold be back in the ER within 24 hours with a variety of complaints; I will see if "tuning up" her meds and continuing ambulation moves things forward. If not I will discuss possible SNF with her and her family when we meet tomorrow AM  . Lowella Dell 06/06/2011

## 2011-06-06 NOTE — Progress Notes (Signed)
Nutrition Follow-up  Diet Order: Dysphagia 3, thin liquid  - TNA d/c on 3/27. Met with pt who reports eating only a little bit of breakfast r/t not liking the scrambled eggs or "pepto bismol yogurt" however reports eating 100% of chicken noodle soup for lunch which pt stated she enjoyed. Pt reports having a little bit of nausea, no vomiting. Pt sounded hoarse and was almost whispering during visit which pt states is because she doesn't have a voice today. Pt not interested in nutritional supplements, says she has been on them in the past and does not like them. DG of abdomen today showed persistent SBO which was been improved per MD, however not resolved.   Meds: Scheduled Meds:   . antiseptic oral rinse  15 mL Mouth Rinse BID  . azithromycin  250 mg Intravenous Q24H  . levetiracetam  250 mg Intravenous Q12H  . methylphenidate  5 mg Oral BID WC  . metoCLOPramide  5 mg Oral TID AC  . milk and molasses   Rectal Once  . mirtazapine  15 mg Oral QHS  . pantoprazole (PROTONIX) IV  40 mg Intravenous Q24H  . DISCONTD: metoCLOPramide  10 mg Oral TID AC  . DISCONTD: scopolamine  1 patch Transdermal Q72H   Continuous Infusions:   . 0.9 % NaCl with KCl 40 mEq / L 20 mL/hr (06/06/11 0918)   PRN Meds:.phenol, sodium chloride, sodium chloride, DISCONTD: promethazine  Labs:  CMP     Component Value Date/Time   NA 135 06/05/2011 0605   NA 143 05/12/2011 1337   K 4.6 06/05/2011 0605   K 4.1 05/12/2011 1337   CL 104 06/05/2011 0605   CL 92* 05/12/2011 1337   CO2 24 06/05/2011 0605   CO2 32 05/12/2011 1337   GLUCOSE 126* 06/05/2011 0605   GLUCOSE 173* 05/12/2011 1337   BUN 11 06/05/2011 0605   BUN 19 05/12/2011 1337   CREATININE 0.92 06/05/2011 0605   CREATININE 1.3* 05/12/2011 1337   CALCIUM 9.3 06/05/2011 0605   CALCIUM 9.8 05/12/2011 1337   PROT 5.8* 06/05/2011 0605   PROT 6.2* 05/12/2011 1337   ALBUMIN 2.7* 06/05/2011 0605   AST 23 06/05/2011 0605   AST 40* 05/12/2011 1337   ALT 29 06/05/2011 0605   ALKPHOS 207*  06/05/2011 0605   ALKPHOS 95* 05/12/2011 1337   BILITOT 0.6 06/05/2011 0605   BILITOT 0.60 05/12/2011 1337   GFRNONAA 58* 06/05/2011 0605   GFRAA 67* 06/05/2011 0605   - Unclear why alkaline phosphatase elevated, maybe r/t TNA?    Intake/Output Summary (Last 24 hours) at 06/06/11 1314 Last data filed at 06/06/11 0740  Gross per 24 hour  Intake   3577 ml  Output    300 ml  Net   3277 ml   Last BM - 3/31  Weight Status: No new weights  Re-estimated needs: No changes.  Kcal: 2100-2500  Protein: 85-105g  Fluid: 2.1-2.5L  Nutrition Dx:  Inadequate oral intake - gradually improving  Goal: TNA to meet =/> 90% of estimated nutritional needs - not met, TNA d/c.   New goal: Pt to consume >75% of meals.   Intervention: Encouraged increased intake of meals as tolerated. Recommend re-weigh pt.   Monitor: Weights, labs, nausea, intake   Marshall Cork Pager #: [phone]

## 2011-06-06 NOTE — Progress Notes (Signed)
Physical Therapy Treatment Patient Details Name: SIDRAH HARDEN MRN: [id number] DOB: Jul 08, 1929 Today's Date: 06/06/2011  PT Assessment/Plan  PT - Assessment/Plan Comments on Treatment Session: pt is improving in gait, but continues to be deconditioned with decrease hip strength and decreased abiliity to change speeds.  Pt considering short stay at SNF to increase independece prior to D/C home alone PT Plan: Discharge plan needs to be updated PT Frequency: Min 3X/week Follow Up Recommendations: Home health PT;Skilled nursing facility Equipment Recommended: Rolling walker with 5" wheels PT Goals  Acute Rehab PT Goals PT Goal: Supine/Side to Sit - Progress: Progressing toward goal PT Goal: Sit to Stand - Progress: Progressing toward goal PT Goal: Stand to Sit - Progress: Progressing toward goal PT Goal: Ambulate - Progress: Progressing toward goal PT Goal: Perform Home Exercise Program - Progress: Progressing toward goal  PT Treatment Precautions/Restrictions  Precautions Precautions: Fall Precaution Comments: NG tube Required Braces or Orthoses: No Restrictions Weight Bearing Restrictions: No Mobility (including Balance) Bed Mobility Bed Mobility: Yes Supine to Sit: 5: Supervision Transfers Transfers: Yes Sit to Stand: 5: Supervision Stand to Sit: 5: Supervision;To chair/3-in-1 Ambulation/Gait Ambulation/Gait: Yes Ambulation/Gait Assistance: 6: Modified independent (Device/Increase time) Ambulation/Gait Assistance Details (indicate cue type and reason): worked on head turns during gait, changing speeds , stop and start. Ambulation Distance (Feet): 400 Feet Assistive device: Rolling walker Gait Pattern: Step-through pattern (working on erect posture in gait) Gait velocity: pt with difficulty changing speeds Stairs: No Wheelchair Mobility Wheelchair Mobility: No  Posture/Postural Control Posture/Postural Control: No significant limitations Balance Balance Assessed:  No Exercise  Other Exercises Other Exercises: standing balance with core activation to increase strength and endurance of upright posture Other Exercises: standing hip abduction x 5 reps each leg Other Exercises: standing knee raises 5 repetitions with each leg End of Session PT - End of Session Equipment Utilized During Treatment: Gait belt Activity Tolerance: Patient tolerated treatment well Patient left: in chair;with call bell in reach Nurse Communication: Mobility status for ambulation General Behavior During Session: [facility] for tasks performed Cognition: [facility] for tasks performed  Donnetta Hail 06/06/2011, 12:27 PM

## 2011-06-06 NOTE — Plan of Care (Signed)
Problem: Inadequate Intake (NI-2.1) Goal: Food and/or nutrient delivery Individualized approach for food/nutrient provision.  Outcome: Progressing Gradually progressing, intake still not substantiall

## 2011-06-06 NOTE — Progress Notes (Signed)
Reaccessed 4/1

## 2011-06-06 NOTE — Progress Notes (Signed)
Patient received milk of molasses enema, was able to have bowel movement. Will continue to monitor during shift.

## 2011-06-07 ENCOUNTER — Other Ambulatory Visit: Payer: Self-pay | Admitting: Certified Registered Nurse Anesthetist

## 2011-06-07 NOTE — Progress Notes (Signed)
Regina West   DOB:Jul 05, 1929   WU#:[id number]   GNF#:[id number]  Subjective: We had a prolonged discussion about these complex clinical issues and went over the various important aspects to consider. All questions were answered. Discussion with pt and daughter [name]. Went over diagnosis, prognosis, treatment, and discharge options. Best option for patient would be assisted living--I have placed a SW consult to facilitate this  Objective:  Filed Vitals:   06/07/11 0532  BP: 148/83  Pulse: 91  Temp: 98.7 F (37.1 C)  Resp: 16    Body mass index is 25.18 kg/(m^2).  Intake/Output Summary (Last 24 hours) at 06/07/11 0908 Last data filed at 06/07/11 0536  Gross per 24 hour  Intake 1916.67 ml  Output      0 ml  Net 1916.67 ml     Sclerae unicteric  Oropharynx clear, no evidence of thrush  No peripheral adenopathy  Lungs no rales or rhonchi  Heart regular rate and rhythm  Abdomen minimal BS, NT, no masses palpated, moderately distended  MSK no focal spinal tenderness  Neuro nonfocal    CBG (last 3)  No results found for this basename: GLUCAP:3 in the last 72 hours   Labs:  Lab Results  Component Value Date   WBC 3.0* 06/05/2011   HGB 9.7* 06/05/2011   HCT 29.3* 06/05/2011   MCV 97.0 06/05/2011   PLT 106* 06/05/2011   NEUTROABS 2.1 06/05/2011     Lab 06/05/11 0605 06/04/11 0515 06/03/11 0403  NA 135 137 134*  K 4.6 4.5 4.3  CL 104 105 100  CO2 24 25 26   GLUCOSE 126* 104* 77  BUN 11 15 22   CREATININE 0.92 0.89 1.02  CALCIUM 9.3 8.9 8.9  MG -- -- --    Urine Studies No results found for this basename: UACOL:2,UAPR:2,USPG:2,UPH:2,UTP:2,UGL:2,UKET:2,UBIL:2,UHGB:2,UNIT:2,UROB:2,ULEU:2,UEPI:2,UWBC:2,URBC:2,UBAC:2,CAST:2,CRYS:2,UCOM:2,BILUA:2 in the last 72 hours     Studies:  Dg Chest 2 View  06/06/2011  *RADIOLOGY REPORT*  Clinical Data: 78 year old female with cough.  History of ovarian cancer.  CHEST - 2 VIEW  Comparison: 10/28/2011 and earlier.  Findings:  Enteric tube removed.  Stable right chest Port-A-Cath. Slightly larger lung volumes.  Small bilateral pleural effusions. Chronic hiatal hernia.  No pneumothorax or pulmonary edema.  No acute airspace disease.  Stable cardiac size and mediastinal contours.  Visualized tracheal air column is within normal limits. No acute osseous abnormality identified.  IMPRESSION: 1.  NG tube removed. 2.  Continued small pleural effusions. No acute cardiopulmonary abnormality. 3.  Hiatal hernia.  Original Report Authenticated By: Harley Hallmark, M.D.   Dg Abd 2 Views  06/06/2011  *RADIOLOGY REPORT*  Clinical Data: Partial small bowel obstruction.  Ovarian cancer.  ABDOMEN - 2 VIEW  Comparison: 03/29 and 06/02/2011 and a CT scan dated 05/23/2011  Findings: There is persistent dilatation of multiple small bowel loops in the left mid abdomen.  Distal small bowel and colon are not dilated.  Large hiatal hernia is noted.  No free air.  Small right pleural effusion.  IMPRESSION: Persistent small bowel obstruction.  No significant change since the prior study.  Original Report Authenticated By: Gwynn Burly, M.D.    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 After these problems resolved  she received 2 additional cycles of single-agent carboplatin completed in March 2011   (2) She had her first recurrence February 2012 treated with carboplatin and Gemzar for a total of 6 cycles between February and June 2012. Her CEA-125 had normalized before the beginning of cycle 5.   (3) now in second recurrence, cycle 2 due 06/13/2011  (4) SBO diagnosed by CT 03/18 with transition point  near terminal ileum, improved but not resolved  (5) malnutrition  (6) depression    Plan: .Lenetta will need either SNF/Rehab or more likely an assisted living situation. Family is interested in [facility] and/or [facility]. I have asked SW to facilitate. From my point of view we may transfer pt as soon as option is available--she will be on her current meds, diet and activity level. I will be out of town after tomorrow so it would be helpful to have this issue resolved in next 24 hours--patient and family aware . Carel Schnee C 06/07/2011

## 2011-06-07 NOTE — Progress Notes (Signed)
Physical Therapy Treatment Patient Details Name: Regina West MRN: [id number] DOB: 08-12-1929 Today's Date: 06/07/2011  PT Assessment/Plan  PT - Assessment/Plan Comments on Treatment Session: Pt reporting dizziness with standing balance activities so requested return to chair.  Agreeable to perform strengthening exercises seated.  Pt from Independent living and looking into AL.  Would need 24/7 supervision if d/c home otherwise would benefit from SNF for activity tolerance and balance training. PT Plan: Discharge plan remains appropriate;Frequency remains appropriate Follow Up Recommendations: Home health PT;Supervision/Assistance - 24 hour (otherwise SNF) Equipment Recommended: Rolling walker with 5" wheels PT Goals  Acute Rehab PT Goals PT Goal: Stand to Sit - Progress: Progressing toward goal PT Goal: Ambulate - Progress: Progressing toward goal PT Goal: Perform Home Exercise Program - Progress: Progressing toward goal  PT Treatment Precautions/Restrictions  Precautions Precautions: Fall Precaution Comments: NG tube Required Braces or Orthoses: No Restrictions Weight Bearing Restrictions: No Mobility (including Balance) Bed Mobility Bed Mobility: No Transfers Sit to Stand Details (indicate cue type and reason): Pt at doorway with daughter upon arriving. Stand to Sit: 5: Supervision Stand to Sit Details: pt did not reach back for armrests Ambulation/Gait Ambulation/Gait: Yes Ambulation/Gait Assistance: 5: Supervision Ambulation/Gait Assistance Details (indicate cue type and reason): pt reports feeling dizzy today but did not get worse Ambulation Distance (Feet): 240 Feet Assistive device: Rolling walker Gait Pattern: Step-through pattern Gait velocity: slow cadence  High Level Balance High Level Balance Activites: Side stepping;Braiding High Level Balance Comments: performed 20 feet to R and then L with bil UE support, pt could not perform more 2* increase in dizzy with  balance activities today. Exercise  General Exercises - Lower Extremity Ankle Circles/Pumps: AROM;20 reps;Seated;Both Long Arc Quad: AROM;Strengthening;Both;20 reps;Seated Hip ABduction/ADduction: AROM;Strengthening;Both;20 reps;Supine Straight Leg Raises: AROM;10 reps;Supine;Both Hip Flexion/Marching: AROM;Strengthening;Both;Other reps (comment);Seated (20 reps) End of Session PT - End of Session Activity Tolerance: Patient tolerated treatment well Patient left: in chair;with call bell in reach General Behavior During Session: [facility] for tasks performed Cognition: [facility] for tasks performed  Regina West,Regina West 06/07/2011, 1:17 PM Pager: [phone]

## 2011-06-07 NOTE — Progress Notes (Signed)
  Clinical Social Work Department BRIEF PSYCHOSOCIAL ASSESSMENT 06/07/2011  Patient:  Regina West, Regina West     Account Number:  [id number]     Admit date:  05/23/2011  Clinical Social Worker:  Larene Beach  Date/Time:  06/07/2011 11:00 AM  Referred by:  Physician  Date Referred:  06/07/2011 Referred for  ALF Placement   Interview type:  Family   PSYCHOSOCIAL DATA Living Status:  ALONE Admitted from facility:  [facility] Level of care:  Independent Living Primary support name:  Regina West Primary support relationship to patient:  CHILD, ADULT Degree of support available:   Strong/Positive    CURRENT CONCERNS Current Concerns  Post-Acute Placement   Other Concerns:    SOCIAL WORK ASSESSMENT / PLAN Clinical Social Work received consult for ALF at discharge. CSW spoke with pt's daughter Regina West to offer support with discharge plans.  Pt's dtr reported that the pt is already established in an independent living apartment at [facility].  Pt's family has arranged for private duty care at [facility] until supportive care programs begins on May 15.  CSW spoke with Revonda Standard at [facility] to confirm, and discuss home health PT options.  HHPT can be arranged in the independent setting.  CSW was informed that because the pt is in independent living setting nor FL2 or CSW discharge information is needed.  CSW contacted Bonita Quin, Case Manager to inform the need for HHPT, and provided pt's dtr's contact information.  CSW called pt's dtr to inform of confirmed information.  Message was left and waiting return call.  CSW will continue to support as needed.   Assessment/plan status:  Information/Referral to [facility] Other assessment/ plan:   CSW contacted Bonita Quin, Sports coach to inform the need for HHPT, and provided pt's dtr's contact information.  CSW called pt's dtr to inform of confirmed information. Message was left and waiting return call.  CSW will continue to support as needed.    Information/referral to community resources:    PATIENT'S/FAMILY'S RESPONSE TO PLAN OF CARE: Pt's daughter was appreciative of CSW contact and support. Pt's dtr has been communicating with [facility] and proactive in arranging care for the pt.  "We just want to make sure she has appropriate care."    Tamala Julian, MSW, LCSW Clinical Social Worker [facility] [phone]  [phone]

## 2011-06-07 NOTE — Progress Notes (Signed)
Thank you for consult on Ms. Regina West.  Known to [place] from prior stay for PRES.  Note that patient is deconditioned but progressing along well.  She requires supervision for transfers but is independent for ambulating 400 feet.  Note discussion about ALF which should be a good transition for her.  Will defer rehab consult as patient is too high level for CIR.

## 2011-06-07 NOTE — Progress Notes (Signed)
CARE MANAGEMENT NOTE 06/07/2011  Patient:  Regina West, Regina West   Account Number:  [id number]  Date Initiated:  06/07/2011  Documentation initiated by:  Colleen Can  Subjective/Objective Assessment:   dx small bowel obstruction 2nd to recurrent malignancy, pmhx ovariaqn cancer     Action/Plan:   CSW advised that patient and family donot want snf or assisted living . Prefers to go to [employer] [facility] where she has an apartment. PT states pt needs 24/7 supervision. CIR consult- pt too high functioning for inpt rehab   Anticipated DC Date:  06/08/2011   Anticipated DC Plan:  HOME W HOME HEALTH SERVICES  In-house referral  Clinical Social Worker      DC Planning Services  CM consult      [facility] Choice  HOME HEALTH   Choice offered to / List presented to:  C-1 Patient        HH arranged  HH-1 RN  HH-2 PT  HH-4 NURSE'S AIDE  HH-10 DISEASE MANAGEMENT      HH agency  [facility]   Status of service:  In process, will continue to follow Medicare Important Message given?   (If response is "NO", the following Medicare IM given date fields will be blank) Comments:  06/07/2011 Teofilo Pod BSN CCM [phone] Cm spoke with daughter-Elizabeth who is handling business for patient via phone,> She wants patient to go to [facility] [employer] [facility] [employer] apartment with [facility] services in place. Daughter states she plans to make further arrangements and someone will be staying with patient upon discharge to her apartment. States she is planning to obtain  private caregiver for patient. Daughter states she has no specific HH agency request because she does not know agencies in this area. Genevieve Norlander has reprentative for [employer] [phone] who was called and states they could provide [facility] services for patient. CM spoke with daughter/caregiver and they are okay with [place] providing the hh services. Plans are that she will meet them at [facility] upon moving in. Contact phone number  for caregiver-[phone].Anticipate discharge tomorrow. Will faxe face sheet, HH orders, H&P ans summary to [facility] at [phone] when available in Epic. List of HH agencies placed in shadow chart.

## 2011-06-08 LAB — CBC
HCT: 27.8 % — ABNORMAL LOW (ref 36.0–46.0)
Hemoglobin: 9.6 g/dL — ABNORMAL LOW (ref 12.0–15.0)
MCH: 32.8 pg (ref 26.0–34.0)
RBC: 2.93 MIL/uL — ABNORMAL LOW (ref 3.87–5.11)

## 2011-06-08 LAB — COMPREHENSIVE METABOLIC PANEL
ALT: 18 U/L (ref 0–35)
Alkaline Phosphatase: 152 U/L — ABNORMAL HIGH (ref 39–117)
CO2: 24 mEq/L (ref 19–32)
Calcium: 9 mg/dL (ref 8.4–10.5)
GFR calc Af Amer: 65 mL/min — ABNORMAL LOW (ref >90)
GFR calc non Af Amer: 56 mL/min — ABNORMAL LOW (ref >90)
Glucose, Bld: 100 mg/dL — ABNORMAL HIGH (ref 70–99)
Sodium: 133 mEq/L — ABNORMAL LOW (ref 135–145)

## 2011-06-08 MED ORDER — MILK AND MOLASSES ENEMA
Freq: Once | RECTAL | Status: DC
  Filled 2011-06-08: qty 250

## 2011-06-08 MED ORDER — MIRTAZAPINE 15 MG PO TBDP: 15.0000 mg | ORAL_TABLET | Freq: Every day | ORAL | Status: DC

## 2011-06-08 NOTE — Discharge Summary (Signed)
Physician Discharge Summary  Patient ID: Regina West MRN: [id number] DOB/AGE: 08/16/1929 78 y.o.  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      cholecalciferol 1000 UNITS tablet   Commonly known as: VITAMIN D   Take 1,000 Units by mouth 2 (two) times daily.      levETIRAcetam 500 MG tablet   Commonly known as: KEPPRA   Take 250 mg by mouth every 12 (twelve) hours. Pt takes 1/2 tab for 250 mg dose      lidocaine-prilocaine cream   Commonly known as: EMLA   Apply 1 application topically as needed.      metoCLOPramide 5 MG tablet   Commonly known as: REGLAN   Take 1 tablet (5 mg total) by mouth 3 (three) times daily before meals.      mirtazapine 15 MG disintegrating tablet   Commonly known as: REMERON SOL-TAB   Take 1 tablet (15 mg total) by mouth at bedtime.      ondansetron 8 MG disintegrating tablet   Commonly known as: ZOFRAN-ODT   Take 8 mg by mouth every 8 (eight) hours as needed. nausea      PRESCRIPTION MEDICATION   Pt gets chemo at [facility]. Last treatment was on 05-16-11 followed by Dr Bevelyn Buckles             Disposition and Follow-up:  The patient will go to her home accompanied by her daughter, Regina West, who will be in town through 06/10/2011. Patient will have HH, aide and PT services.. The patient has a followup appointment  in our office April 8 at 1:45 PM  Significant Diagnostic Studies:  Dg Abd 1 View  06/02/2011  *RADIOLOGY REPORT*  Clinical Data: Small bowel obstruction.  ABDOMEN - 1 VIEW  Comparison: 06/01/2011  Findings: Small bowel dilatation in the left abdomen is similar to prior study.  Increasing gas within the colon.  No free air organomegaly.  Degenerative changes in the lumbar spine and hips.  IMPRESSION: Continued gaseous distention of small bowel loops in the left abdomen.  Increasing colonic gas since prior study.  Original Report Authenticated By: Cyndie Chime, M.D.   Dg Abd 1 View  06/01/2011  *RADIOLOGY REPORT*  Clinical Data: Follow up small bowel obstruction  ABDOMEN - 1 VIEW  Comparison: 05/31/2011  Findings: Degenerative changes lumbar spine again noted.  Again noted gaseous distended small bowel loops left mid abdomen consistent with partial small bowel obstruction.  Moderate stool in the rectosigmoid colon.  IMPRESSION: Again noted gaseous distended small bowel loops left mid abdomen consistent with partial small bowel obstruction.  Original Report Authenticated By: Natasha Mead, M.D.   Dg Abd 1 View  05/31/2011  *RADIOLOGY REPORT*  Clinical Data: Follow up SBO  ABDOMEN - 1 VIEW  Comparison: 05/30/2011  Findings: Stable moderately dilated loops of small bowel in the left mid abdomen.  Decreased overall stool burden.  Colonic diverticulosis.  Degenerative changes of the visualized thoracolumbar spine.  IMPRESSION: Stable moderately dilated loops of small bowel in the left mid abdomen, compatible with partial small bowel obstruction.  Decreased overall stone burden.  Original Report Authenticated By: Charline Bills, M.D.   Dg Abd 1 View  05/29/2011  *RADIOLOGY REPORT*  Clinical Data: Evaluate small bowel obstruction  ABDOMEN - 1 VIEW  Comparison: 05/28/2011; 05/27/2011; 05/26/2011; abdominal CT - 05/23/2011; chest radiograph - 05/28/2011  Findings:  No change to minimal worsening of gaseous  distension of multiple loops of small bowel with index loop in the mid hemiabdomen now measuring 5.6 cm in diameter, previously, 4.8 cm.  Evaluation for pneumoperitoneum is limited secondary to supine patient positioning.  Limited visualization of the lower thorax demonstrates a portion of the enteric tube within a large hiatal hernia.  Previously ingested enteric contrast remains within the distal small bowel.  There is a conspicuous paucity of distal colonic gas.  Multilevel lumbar spine degenerative change.  IMPRESSION: 1.  No change to minimal worsening of findings compatible with high- grade partial small bowel obstruction. 2.  Enteric tube remains within the herniated portion of the previously identified hiatal herni.  Repositioning may be considered as clinically indicated.  Original Report Authenticated By: Waynard Reeds, M.D.   Dg Abd 1 View  05/28/2011  *RADIOLOGY REPORT*  Clinical Data: Abdominal pain and distention.  History of small bowel obstruction.  Constipation.  ABDOMEN - 1 VIEW  Comparison: 1 day prior  Findings: Single supine view.  Nasogastric tube only partially imaged.  The tip is likely within the hiatal hernia.  No gross free intraperitoneal air.  Gas filled small bowel loops measure up to 4.8 cm.  No pneumatosis.  Gas and contrast within normal caliber colon.  Distal gas identified.  Phleboliths in the pelvis.  Probable vascular or cartilaginous calcifications over the right upper quadrant.  IMPRESSION: Slight decrease in small bowel dilatation.  Either a mild adynamic ileus or low grade partial small bowel obstruction.  Original Report Authenticated By: Consuello Bossier, M.D.   Dg Abd 1 View  05/27/2011  *RADIOLOGY REPORT*  Clinical Data: Small bowel obstruction.  ABDOMEN - 1 VIEW  Comparison: 05/26/2011 11/08/2011  Findings: Single portable view of the abdomen was obtained.  Again noted are dilated loops of small bowel in the left abdomen. Limited evaluation for free air on this  examination.  Evidence for stool and some gas within the colon. A nasogastric tube is not seen within the abdomen.  Small bowel loops measure up to 5.4 cm, suggesting that there may be increased small bowel distention.  IMPRESSION: Persistent dilatation of small bowel loops, predominately along the left side of the abdomen.  There is concern for increased small bowel distention.  Limited evaluation for free air on this examination.  Original Report Authenticated By: Richarda Overlie, M.D.   Dg Abd 1 View  05/25/2011  *RADIOLOGY REPORT*  Clinical Data: Small bowel obstruction, follow-up  ABDOMEN - 1 VIEW  Comparison: Abdomen films of 05/24/2011  Findings: There are persistently dilated small bowel loops consistent with partial small bowel obstruction.  No colonic distention is seen although some air is noted within the rectum. No opaque calculi are seen.  IMPRESSION: Persistent gaseous distention of small bowel consistent with partial small bowel obstruction.  Original Report Authenticated By: Juline Patch, M.D.  Ct Abdomen Pelvis W Contrast  05/23/2011  *RADIOLOGY REPORT*  Clinical Data: Evaluate for small bowel obstruction.  CT ABDOMEN AND PELVIS WITH CONTRAST  Technique:  Multidetector CT imaging of the abdomen and pelvis was performed following the standard protocol during bolus administration of intravenous contrast.  Contrast: OMNIPAQUE IOHEXOL 300 MG/ML IJ SOLN  Comparison: 05/02/2011  Findings: There are bilateral pleural effusions, right greater than left.  Atelectasis is noted within both lung bases.  The patient has a very large hiatal hernia.  The stomach and the small bowel loops are unremarkable.  The adrenal glands are normal. Gallbladder appears normal.  There is no biliary dilatation.  There is moderate atrophy of the pancreatic parenchyma.  Normal appearance of both kidneys.  No enlarged upper abdominal lymph nodes.  There is no pelvic or inguinal adenopathy.  The urinary bladder appears  normal.  There is a small amount of free fluid within the pelvis.  Marked increased caliber of the proximal small bowel loops are noted measuring up to 4.1 cm.  Transition to decreased caliber small bowel loops is noted within the central portion of the abdomen, image 67.  The terminal ileum is collapsed.  There is a moderate amount of stool identified within the proximal colon.  Extensive sigmoid diverticulosis is noted without acute inflammation.  There is no evidence for bowel perforation.  No abscess formation is identified.  Small umbilical hernia contains fat only.  Review of the visualized osseous structures is significant for mild scoliosis and multilevel spondylosis.  There is a retrolisthesis of L1 on L2 and L2 on L3.  Extensive facet degenerative changes noted.  IMPRESSION:  1.  Examination is positive for high-grade small bowel obstruction. The transition point is proximal to the terminal ileum within the central abdomen, image 67 of the axial series. 2.  Very large hiatal hernia.  3.  Bilateral pleural effusions, right greater than left.  Original Report Authenticated By: Rosealee Albee, M.D.   US Abdomen Limited  05/18/2011  Clinical data:  Ovarian carcinoma;   abdominal distention; request is made for therapeutic paracentesis.  LIMITED ULTRASOUND ABDOMEN   05/18/2011  Technique:  Limited ultrasound of the abdomen and pelvis in all four quadrants today revealed only trace amount of ascites. Paracentesis was not performed at this time.  Dr. Darnelle Catalan  was notified of the above findings.  Impression:  Trace amount of ascites.  Paracentesis was not performed at this time.  Read by: Jeananne Rama, P.A.-C  Original Report Authenticated By: Judie Petit. Ruel Favors, M.D.   Dg Chest Port 1 View  05/28/2011  *RADIOLOGY REPORT*  Clinical Data: Cough.  History of ovarian cancer and small bowel obstruction.  PORTABLE CHEST - 1 VIEW 05/28/2011 1105 hours:  Comparison: Two-view chest x-ray 05/02/2011 [facility] and one-view chest x-ray 06/01/2009 [facility]  Findings: Nasogastric tube looped in the stomach which is within the hiatal hernia above the diaphragm.  Cardiac silhouette normal in size, unchanged.  Thoracic aorta tortuous atherosclerotic, unchanged.  Hilar and mediastinal contours otherwise unremarkable. Mild atelectasis in the lung bases.  Lungs otherwise clear.  Right jugular Port-A-Cath tip remains in the SVC.  IMPRESSION: Mild bibasilar atelectasis.  No acute cardiopulmonary disease otherwise.  Nasogastric tube looped in the stomach which is within the hiatal hernia above the diaphragm.  Original Report Authenticated By: Arnell Sieving, M.D.   Dg Abd 2 Views  05/26/2011  *RADIOLOGY REPORT*  Clinical Data:  ABDOMEN - 2 VIEW  Comparison: None.  Findings: NG tube placement NG tube is coiled within a large hiatal hernia above the diaphragm.  There is interval decrease in size of dilated loops of small bowel which measures 3 cm compared to 4 cm on prior.  There are fluid levels in the small bowel.  There is gas within the rectosigmoid colon.  No free air on the left lateral decubitus view.  IMPRESSION:  1.  NG tube coiled within a large sliding type hiatal hernia above the diaphragm. 2.  Interval decrease in size of dilated loops of small bowel. 3.  Gas within the rectum. 4.  No free air.  Original Report Authenticated By: Genevive Bi, M.D.   Dg Abd 2 Views  05/24/2011  *RADIOLOGY REPORT*  Clinical Data: Abdominal distention.  Nasogastric tube placed.  ABDOMEN - 2 VIEW  Comparison: CT abdomen pelvis and acute abdominal series 05/23/2011 .  Findings: The lung bases and upper aspect of the abdomen are not included on these images.  Distal aspect of the nasogastric tube projects over the expected location of the proximal stomach.  Right side up decubitus view is submitted.  Evaluation for free air is limited by overexposure of the far lateral right abdomen.  No gross evidence of free  air is seen.  There are air-fluid levels and multiple dilated loops of small bowel.  Small bowel dilatation measures up to least 4.5 cm.  There is stool and a small amount of gas visualized in the proximal colon.  Oral contrast material is seen in the visualized portion of the upper urinary bladder (inferior pelvis not completely included on the image). This contrast is from CT abdomen pelvis performed yesterday.  IMPRESSION:  1.  Persistent high-grade small bowel obstruction pattern. 2.  No gross evidence of free intraperitoneal air. 3.  Nasogastric tube terminates in the proximal stomach (the stomach is not completely included on this image.  Original Report Authenticated By: Britta Mccreedy, M.D.   Dg Abd Acute W/chest  05/23/2011  *RADIOLOGY REPORT*  Clinical Data: Pain and weakness.  ACUTE ABDOMEN SERIES (ABDOMEN 2 VIEW & CHEST 1 VIEW)  Comparison: None.  Findings: Right greater than left pleural effusions.  No infiltrates or nodules.  Cardiomegaly.  Calcified tortuous aorta. Large fluid-filled hiatal hernia.  Dilated small bowel loops with air-fluid levels suggesting partial obstruction.  Moderate stool burden.  Degenerative change lumbar spine and hips. Small bowel obstruction was not present on previous CT.  IMPRESSION: Partial small bowel obstruction is suspected.  Right greater than left pleural effusions with large hiatal hernia.  Original Report Authenticated By: Elsie Stain, M.D.   Dg Abd Portable 1v  05/30/2011  *RADIOLOGY REPORT*  Clinical Data: Small bowel obstruction.  Increased nausea vomiting.  PORTABLE ABDOMEN - 1 VIEW  Comparison: 05/29/2011  Findings: 0520 hours.  Supine film again shows some gaseous small bowel distention in the left abdomen.  A small bowel loops measure up to 4.822 cm in diameter today compared to 5.6 cm in maximum diameter previously.  IMPRESSION: No substantial interval change.  The patient does have some persistent dilated small bowel loops in the left abdomen.   Original Report Authenticated By: ERIC A. MANSELL, M.D.   Dg Naso G Tube Plc W/fl W/rad  05/27/2011  Fluoroscopically guided nasogastric tube placement.  Clinical history:  Small bowel obstruction.  Hiatal hernia.  Fluoroscopic time 2.16 minutes.  Procedure:  18-French nasogastric tube was placed under fluoroscopy.  The patient has a hiatal hernia.  The tip  of the tube was left within the stomach above the hemidiaphragm on the left. Large volume of fluid emanated from the tube after placement.  Impression:  Successful NG tube placement with the tip left within the patient's hiatal hernia.  Original Report Authenticated By: Bernadene Bell. D'ALESSIO, M.D.   Dg Vangie Bicker G Tube Plc Lanell Persons W/rad  05/24/2011  Fluoroscopically guided nasogastric tube placement.  Clinical history:  Small bowel obstruction.  Hiatal hernia.  Fluoroscopic time:  2.26 minutes.  Procedure:  After failed attempt at placing nasogastric tube without fluoroscopic guidance, fluoroscopically guided placement was requested.  Nasogastric tube was placed into the hiatal hernia and became curled within such.  This was not able to be advanced. As there was drainage of green bilious material, nasogastric tube was left in this position.  Impression:  Nasogastric tube curled within the hiatal hernia.  Original Report Authenticated By: Fuller Canada, M.D.    Discharge Laboratory Values: CBC    Component Value Date/Time   WBC 2.0* 06/08/2011 0350   WBC 2.9* 05/23/2011 1424   RBC 2.93* 06/08/2011 0350   RBC 4.08 05/23/2011 1424   HGB 9.6* 06/08/2011 0350   HGB 13.2 05/23/2011 1424   HCT 27.8* 06/08/2011 0350   HCT 37.0 05/23/2011 1424   PLT 109* 06/08/2011 0350   PLT 226 05/23/2011 1424   MCV 94.9 06/08/2011 0350   MCV 90.7 05/23/2011 1424   MCH 32.8 06/08/2011 0350   MCH 32.4 05/23/2011 1424   MCHC 34.5 06/08/2011 0350   MCHC 35.7 05/23/2011 1424   RDW 12.1 06/08/2011 0350   RDW 11.7 05/23/2011 1424   LYMPHSABS 0.7 06/05/2011 0605   LYMPHSABS 0.4* 05/23/2011 1424    MONOABS 0.1 06/05/2011 0605   MONOABS 0.5 05/23/2011 1424   EOSABS 0.0 06/05/2011 0605   EOSABS 0.0 05/23/2011 1424   BASOSABS 0.0 06/05/2011 0605   BASOSABS 0.0 05/23/2011 1424    .  Hospital course: For complete details please refer to admission H and P, but in brief, the patient was admitted with a high-grade small bowel obstruction under the care of Dr. Lorain Childes. She came under our service the following day, and surgery was consulted. I also contacted the patient's gynecologic oncologist, Dr. Ivar Drape from [facility] regarding the patient's admission. The patient was treated conservatively, and an NG tube was placed to continuous suction, she was hydrated, her electrolyte imbalances were corrected, and her malnutrition was treated with TNA given through her port. The patient was encouraged to ambulate but she was very fatigued and for many days her activity was minimal. Over the last several days the patient has been more active. 4 days ago she had a bowel movement, and at that point her NG was removed and she was started on clear liquids. Her diet has been advanced and at this point the patient is taking a regular diet with moderate tolerancel. She has had no vomiting in the past 24 h and essentially no abdominal pain.  She remains weak and ambulatory with assistance. We are trying to obtain a walker for the patient at to have at home. She will also have Home Health, and Aide, and home PT. Her daughter will be in town the next 72 hours and stay with the patient 24/7 to ensure a safe transition.  Physical Exam at the time of this dictation: BP 130/84  Pulse 96  Temp(Src) 98.7 F (37.1 C) (Oral)  Resp 18  Ht 5\' 6"  (1.676 m)  Wt 156 lb (70.761  kg)  BMI 25.18 kg/m2  SpO2 94% Gen: Alert, supine in bed,  oriented x3 Cardiovascular regular rate and rhythm no murmur appreciated Respiratory: No rales or rhonchi Gastrointestinal: Mild distention of the abdomen, decreased but present bowel sounds, no  tenderness to palpation   Principal Problem:  *Small bowel obstruction Active Problems:  Ovarian cancer  Seizure disorder  Hyponatremia   Diet:  As tolerated  Activity:  Fall precautions  Condition at Discharge:   Improved  Signed: Dr. Ruthann Cancer [phone]  06/08/2011, 8:14 AM

## 2011-06-09 ENCOUNTER — Other Ambulatory Visit: Payer: Self-pay | Admitting: *Deleted

## 2011-06-09 DIAGNOSIS — K56609 Unspecified intestinal obstruction, unspecified as to partial versus complete obstruction: Secondary | ICD-10-CM

## 2011-06-09 DIAGNOSIS — C569 Malignant neoplasm of unspecified ovary: Secondary | ICD-10-CM

## 2011-06-09 MED ORDER — METOCLOPRAMIDE HCL 5 MG PO TABS: 5.0000 mg | ORAL_TABLET | Freq: Three times a day (TID) | ORAL | Status: DC

## 2011-06-09 MED ORDER — ONDANSETRON 8 MG PO TBDP: 8.0000 mg | ORAL_TABLET | Freq: Three times a day (TID) | ORAL | Status: DC | PRN

## 2011-06-13 ENCOUNTER — Ambulatory Visit (HOSPITAL_BASED_OUTPATIENT_CLINIC_OR_DEPARTMENT_OTHER): Payer: Medicare Other

## 2011-06-13 ENCOUNTER — Other Ambulatory Visit: Payer: Self-pay | Admitting: *Deleted

## 2011-06-13 ENCOUNTER — Encounter: Payer: Self-pay | Admitting: Physician Assistant

## 2011-06-13 ENCOUNTER — Other Ambulatory Visit: Payer: Medicare Other | Admitting: Lab

## 2011-06-13 ENCOUNTER — Telehealth: Payer: Self-pay | Admitting: *Deleted

## 2011-06-13 ENCOUNTER — Ambulatory Visit: Payer: Medicare Other | Admitting: Physician Assistant

## 2011-06-13 VITALS — BP 122/79 | HR 85 | Temp 97.7°F | Ht 66.0 in | Wt 146.3 lb

## 2011-06-13 DIAGNOSIS — C569 Malignant neoplasm of unspecified ovary: Secondary | ICD-10-CM

## 2011-06-13 DIAGNOSIS — K219 Gastro-esophageal reflux disease without esophagitis: Secondary | ICD-10-CM

## 2011-06-13 DIAGNOSIS — Z452 Encounter for adjustment and management of vascular access device: Secondary | ICD-10-CM

## 2011-06-13 DIAGNOSIS — Z5111 Encounter for antineoplastic chemotherapy: Secondary | ICD-10-CM

## 2011-06-13 LAB — COMPREHENSIVE METABOLIC PANEL
ALT: 14 U/L (ref 0–35)
AST: 22 U/L (ref 0–37)
Chloride: 89 mEq/L — ABNORMAL LOW (ref 96–112)
Creatinine, Ser: 0.93 mg/dL (ref 0.50–1.10)
Total Bilirubin: 0.3 mg/dL (ref 0.3–1.2)

## 2011-06-13 LAB — CBC WITH DIFFERENTIAL/PLATELET
BASO%: 0.6 % (ref 0.0–2.0)
EOS%: 0.3 % (ref 0.0–7.0)
HCT: 30 % — ABNORMAL LOW (ref 34.8–46.6)
LYMPH%: 28.4 % (ref 14.0–49.7)
MCH: 31.8 pg (ref 25.1–34.0)
MCHC: 35 g/dL (ref 31.5–36.0)
NEUT%: 50.9 % (ref 38.4–76.8)
Platelets: 234 10*3/uL (ref 145–400)

## 2011-06-13 MED ORDER — DEXAMETHASONE SODIUM PHOSPHATE 4 MG/ML IJ SOLN
20.0000 mg | Freq: Once | INTRAMUSCULAR | Status: AC
  Administered 2011-06-13: 20 mg via INTRAVENOUS

## 2011-06-13 MED ORDER — SODIUM CHLORIDE 0.9 % IV SOLN
250.0000 mg | Freq: Once | INTRAVENOUS | Status: AC
  Administered 2011-06-13: 250 mg via INTRAVENOUS
  Filled 2011-06-13: qty 25

## 2011-06-13 MED ORDER — OMEPRAZOLE 40 MG PO CPDR: 40.0000 mg | DELAYED_RELEASE_CAPSULE | Freq: Every day | ORAL | Status: DC

## 2011-06-13 MED ORDER — ALTEPLASE 2 MG IJ SOLR
2.0000 mg | Freq: Once | INTRAMUSCULAR | Status: AC | PRN
  Administered 2011-06-13: 2 mg
  Filled 2011-06-13: qty 2

## 2011-06-13 MED ORDER — ONDANSETRON 16 MG/50ML IVPB (CHCC)
16.0000 mg | Freq: Once | INTRAVENOUS | Status: AC
  Administered 2011-06-13: 16 mg via INTRAVENOUS

## 2011-06-13 MED ORDER — SODIUM CHLORIDE 0.9 % IJ SOLN
100.0000 ug | Freq: Once | INTRAVENOUS | Status: AC
  Administered 2011-06-13: 0.1 mg via INTRADERMAL
  Filled 2011-06-13: qty 0.01

## 2011-06-13 MED ORDER — DOXORUBICIN HCL LIPOSOMAL CHEMO INJECTION 2 MG/ML
30.0000 mg/m2 | Freq: Once | INTRAVENOUS | Status: AC
  Administered 2011-06-13: 54 mg via INTRAVENOUS
  Filled 2011-06-13: qty 27

## 2011-06-13 NOTE — Telephone Encounter (Signed)
gave patient appointment for 07-2011 thru 08-2011 printed out calendar and gave to the patient in the chemo room

## 2011-06-13 NOTE — Telephone Encounter (Signed)
gave patient appointments for 06-22-2011 and 07-11-2011 and 07-18-2011  lab md or midlevel treatment printed out calendar and gave to the patient in the chemo room

## 2011-06-13 NOTE — Progress Notes (Signed)
ID: Regina West   DOB: 10/23/1929  MR#: [id number]  WUJ#:[id number]  HISTORY OF PRESENT ILLNESS: The patient originally presented in the summer of 2010 with cramps and abdominal distension.  I do not have a copy of the initial evaluation, but on November 26, 2008, the patient underwent optimal debulking with bilateral salpingo-oophorectomy, omentectomy, and the placement of an intraperitoneal port.  The pathology from that procedure (Accession Number [id number] at [facility]) showed first of all, significant involvement of the omentum, minimal involvement of the right ovary and right fallopian tube and negative on the left ovary and fallopian tube, with neither ovary being enlarged, consistent with a primary peritoneal serous adenocarcinoma, described as moderately differentiated.  The sample included no lymph nodes.    The patient had an intraperitoneal port placed in the same surgery and was treated with intraperitoneal and IV chemotherapy according to GOG-252 but I am not sure which arm she was in.  (Arm 2 did carboplatin intraperitoneally and Taxol IV.  Arm 3 did cisplatin and paclitaxel intraperitoneally with paclitaxel IV.)  All arms received bevacizumab.  Unfortunately, after 4 cycles of treatment, she had acute mental status changes and was admitted here January of 2011 with what proved to be posterior reversible leukoencephalopathy and SIADH.  She had seizures, aphasia, and required intubation.  Once the patient recovered from this, she was treated with 2 cycles of single-agent carboplatin (I do not have the AUC). Her last adjuvant treatment was May 12, 2009, and her CA-125 at that time was 10.3.  Her intraperitoneal port was removed in April of 2011.    More recently, the patient was in routine follow up when her CA-125 was found to jump up to 2,269.7 (March 26, 2010).  This is 10 months after her last prior chemotherapy.  She had CTs of the chest, abdomen and pelvis January 26th which showed  ascites and enhancing peritoneal nodularity. She had had similar findings at presentation but these had completely resolved by the time she finished treatment in March of 2011.    The patient was felt to be in first relapse. Subsequent treatments are as summarized below   INTERVAL HISTORY: Regina West received one dose of carboplatin and Doxil in mid March. She was hospitalized in thereafter, however, with a small bowel obstruction, admitted on 05/23/2011, and discharged on 06/08/2011. She returns today accompanied by her daughters for followup and assessment, due for her second dose of carboplatin/Doxil today.  She has a home health nurse coming out from [place] on a regular basis now, beginning this week.  REVIEW OF SYSTEMS: Since her discharge from the hospital, Regina West is feeling a little better. She is still having very irregular bowel movements. She feels that she is slightly impacted. She can fill the stool right at the rectum, but simply cannot have the bowel movement. Her appetite is decreased. She has some mild taste aversion. She admits that she is not eating much, but is trying very hard to keep herself well hydrated. She has some mild queasiness, but no actual nausea, and no emesis. She denies any abdominal pain.  She is still very concerned about the fact that she has an apparent "laryngitis" since the NG tube during hospitalization. Her voice has not improved at all over the last week. She denies a sore throat, and has had no problems swallowing. No fevers, chills, or night sweats.  A detailed review of systems is otherwise stable and noncontributory.   PAST MEDICAL HISTORY: Significant for hysterectomy at the  age of 65 with concurrent bladder "tuck up."  She also underwent appendectomy during that procedure.  She is status post prior bilateral cataract surgery. She is status post tonsillectomy and adenoidectomy.  She has a history of hypertension and diabetes. She has a history of  diverticulosis.  She has a large hiatal hernia.  She has degenerative disk disease.  She has been noted to have coronary calcifications and she has hyperlipidemia.  There is a history of remote tobacco abuse. Seizure disorder as per HPI  FAMILY HISTORY The patient's father died at the age of 21 after heart surgery for aortic stenosis.  The patient's mother died at the age of 71.  The patient has one brother in good health.  There is no other family history to her knowledge of ovarian or breast cancer.    GYNECOLOGIC HISTORY: Menarche at age 79.  She is GX P3 with menopause in her late 32s.  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  Her husband was in sales and had a history of Parkinson's disease.  He died following a fall in the year 2000.  The patient's son Regina West lives in [place], and has a history of MS.  Daughter Regina West lives in [place].  She is a Futures trader.  Son Regina West lives in [place], [place] and is in Airline pilot. The patient has 8 grandchildren. She attends the [employer].  She is a good friend of our patients [name]   ADVANCED DIRECTIVES: in place  HEALTH MAINTENANCE: History  Substance Use Topics  . Smoking status: Former Smoker    Quit date: 05/24/1959  . Smokeless tobacco: Never Used  . Alcohol Use: No     Colonoscopy: 2008  PAP: UTD  MM: refuses  Allergies  Allergen Reactions  . Avastin (Bevacizumab) Other (See Comments)    Swelling of the brain     Current Outpatient Prescriptions  Medication Sig Dispense Refill  . antiseptic oral rinse (BIOTENE) LIQD 15 mLs by Mouth Rinse route 2 (two) times daily.  1 Bottle  0  . cholecalciferol (VITAMIN D) 1000 UNITS tablet Take 1,000 Units by mouth 2 (two) times daily.      Marland Kitchen escitalopram (LEXAPRO) 10 MG tablet       . levETIRAcetam (KEPPRA) 500 MG tablet Take 250 mg by mouth every 12  (twelve) hours. Pt takes 1/2 tab for 250 mg dose      . lidocaine-prilocaine (EMLA) cream Apply 1 application topically as needed.      . metoCLOPramide (REGLAN) 5 MG tablet Take 1 tablet (5 mg total) by mouth 3 (three) times daily before meals.  90 tablet  3  . mirtazapine (REMERON SOL-TAB) 15 MG disintegrating tablet Take 1 tablet (15 mg total) by mouth at bedtime.  30 tablet  3  . omeprazole (PRILOSEC) 40 MG capsule Take 1 capsule (40 mg total) by mouth at bedtime.  30 capsule  3  . ondansetron (ZOFRAN-ODT) 8 MG disintegrating tablet Take 1 tablet (8 mg total) by mouth every 8 (eight) hours as needed. nausea  20 tablet  3  . PRESCRIPTION MEDICATION Pt [facility]. Last treatment was on 05-16-11 followed by Dr Bevelyn Buckles      . prochlorperazine (COMPAZINE) 10 MG tablet Take 1 tablet (10 mg total) by mouth every 6 (six) hours as needed.  30 tablet  0  .  prochlorperazine (COMPAZINE) 10 MG tablet        No current facility-administered medications for this visit.   Facility-Administered Medications Ordered in Other Visits  Medication Dose Route Frequency Provider Last Rate Last Dose  . alteplase (CATHFLO ACTIVASE) injection 2 mg  2 mg Intracatheter Once PRN Regina Gravel, PA   2 mg at 06/13/11 1537  . CARBOplatin (PARAPLATIN) 250 mg in sodium chloride 0.9 % 100 mL chemo infusion  250 mg Intravenous Once Tirzah Fross G Luisenrique Conran, PA      . CARBOplatin chemo intradermal Test Dose 100 mcg  100 mcg Intradermal Once Catalino Plascencia G Keionna Kinnaird, PA      . dexamethasone (DECADRON) injection 20 mg  20 mg Intravenous Once Magdalen Cabana G Andilynn Delavega, PA      . DOXOrubicin HCL LIPOSOMAL (DOXIL) 54 mg in dextrose 5 % 250 mL chemo infusion  30 mg/m2 (Treatment Plan Actual) Intravenous Once Solange Emry Allegra Grana, PA      . ondansetron (ZOFRAN) IVPB 16 mg  16 mg Intravenous Once Regina Gravel, PA        OBJECTIVE: Elderly white woman who appears anxious Filed Vitals:   06/13/11 1413  BP: 122/79  Pulse: 85  Temp: 97.7 F (36.5 C)     Body mass index is 23.61  kg/(m^2).    ECOG FS: 2  Sclerae unicteric Oropharynx clear, no evidence of mucositis or candidiasis. No peripheral adenopathy Lungs clear to auscultation, no rales or rhonchi; fair excursion bilaterally Heart regular rate and rhythm, no gallops, murmurs, or rubs. Abd distended, not terse, positive bowel sounds x4, nontender, no organomegaly, no masses palpated.  MSK no focal spinal tenderness, no peripheral edema Skin is benign Neuro: nonfocal  LAB RESULTS:  Lab Results  Component Value Date   CA125 144.4* 06/08/2011   this is a drop from 426.8 just 3 weeks ago, after only one cycle of chemotherapy.  Lab Results  Component Value Date   WBC 3.4* 06/13/2011   NEUTROABS 1.7 06/13/2011   HGB 10.5* 06/13/2011   HCT 30.0* 06/13/2011   MCV 90.9 06/13/2011   PLT 234 06/13/2011      Chemistry      Component Value Date/Time   NA 133* 06/08/2011 0350   NA 143 05/12/2011 1337   K 3.9 06/08/2011 0350   K 4.1 05/12/2011 1337   CL 101 06/08/2011 0350   CL 92* 05/12/2011 1337   CO2 24 06/08/2011 0350   CO2 32 05/12/2011 1337   BUN 11 06/08/2011 0350   BUN 19 05/12/2011 1337   CREATININE 0.94 06/08/2011 0350   CREATININE 1.3* 05/12/2011 1337      Component Value Date/Time   CALCIUM 9.0 06/08/2011 0350   CALCIUM 9.8 05/12/2011 1337   ALKPHOS 152* 06/08/2011 0350   ALKPHOS 95* 05/12/2011 1337   AST 19 06/08/2011 0350   AST 40* 05/12/2011 1337   ALT 18 06/08/2011 0350   BILITOT 0.4 06/08/2011 0350   BILITOT 0.60 05/12/2011 1337      STUDIES: 06/06/2011 RADIOLOGY REPORT*  Clinical Data: Partial small bowel obstruction. Ovarian cancer.  ABDOMEN - 2 VIEW  Comparison: 03/29 and 06/02/2011 and a CT scan dated 05/23/2011  Findings: There is persistent dilatation of multiple small bowel  loops in the left mid abdomen. Distal small bowel and colon are  not dilated. Large hiatal hernia is noted.  No free air. Small right pleural effusion.  IMPRESSION:  Persistent small bowel obstruction. No significant change since  the prior  study.  Original Report Authenticated By: Gwynn Burly, M.D.   06/06/2011 *RADIOLOGY REPORT*  Clinical Data: 78 year old female with cough. History of ovarian  cancer.  CHEST - 2 VIEW  Comparison: 10/28/2011 and earlier.  Findings: Enteric tube removed. Stable right chest Port-A-Cath.  Slightly larger lung volumes. Small bilateral pleural effusions.  Chronic hiatal hernia. No pneumothorax or pulmonary edema. No  acute airspace disease. Stable cardiac size and mediastinal  contours. Visualized tracheal air column is within normal limits.  No acute osseous abnormality identified.  IMPRESSION:  1. NG tube removed.  2. Continued small pleural effusions. No acute cardiopulmonary  abnormality.  3. Hiatal hernia.  Original Report Authenticated By: Harley Hallmark, M.D.   ASSESSMENT: 78 year old [place] woman   (1) status post optimal debulking of a primary peritoneal serous adenocarcinoma September 2010, the tumor being moderately differentiated, pT3c NX (stage IIIC), treated according to GOG 252 with  intraperitoneal platinum and paclitaxel x4 given along with bevacizumab, complicated by SIADHafter cycle 4, also with posterior reversible leukoencephalopathy.  After these problems resolved she received 2 additional cycles of single-agent carboplatin completed in March 2011  (2)  She had her first recurrence February 2012  treated with carboplatin and Gemzar for a total of 6 cycles between February and June 2012.  Her CEA-125 had normalized before the beginning of cycle 5.   (3)  now with second recurrence noted by CT, in addition to an increase in the CA 125 in late February 2013. Currently being treated with carboplatin and Doxil, due for day 1 cycle 2 today, to be given every 28 days.  PLAN:  This case was reviewed with Dr. Darnelle Catalan was also spoken with the patient today. She will proceed for her second dose of carboplatin with Doxil today. The Doxil will be given at a dose of 30 mg  per meter square per Dr. Darrall Dears instruction. She'll return for the next 2 days for supportive IV fluids, and I will see her next week for assessment of chemotoxicity.  In the meanwhile, we are starting Regina West on omeprazole 40 mg nightly to see if this helps at all with her throat, and her "laryngitis". We will reassess at her next visit. We have also advised her to try a glycerin suppository and/or fleets enemas for the impaction. If she does not have a bowel movement within the next day, she understands to call our office and let us know.  We will continue to follow Regina West closely, and we'll check her CA 125 a monthly basis. She'll be seeing Dr. Darnelle Catalan 1 month from now when she returns on May 6 for her third cycle of chemotherapy.  Regina West and her daughters all voice understanding and agreement with this plan. They do understand to call with any changes or problems.   Before she returns to see Dr. Nedra Hai she will have a repeat CT of the abdomen and pelvis as well as a chest x-ray   [facility]    06/13/2011

## 2011-06-13 NOTE — Patient Instructions (Signed)
Dc home with friend, using walker. Call if any problems

## 2011-06-13 NOTE — Progress Notes (Signed)
Carbo test dose place at 1610, read negative at 1610,[id number] and 1640.

## 2011-06-14 ENCOUNTER — Ambulatory Visit (HOSPITAL_BASED_OUTPATIENT_CLINIC_OR_DEPARTMENT_OTHER): Payer: Medicare Other

## 2011-06-14 VITALS — BP 103/68 | HR 83 | Temp 97.8°F

## 2011-06-14 DIAGNOSIS — C569 Malignant neoplasm of unspecified ovary: Secondary | ICD-10-CM

## 2011-06-14 MED ORDER — SODIUM CHLORIDE 0.9 % IV SOLN
Freq: Once | INTRAVENOUS | Status: AC
  Administered 2011-06-14: 15:00:00 via INTRAVENOUS

## 2011-06-15 ENCOUNTER — Ambulatory Visit (HOSPITAL_BASED_OUTPATIENT_CLINIC_OR_DEPARTMENT_OTHER): Payer: Medicare Other

## 2011-06-15 DIAGNOSIS — C569 Malignant neoplasm of unspecified ovary: Secondary | ICD-10-CM

## 2011-06-15 DIAGNOSIS — Z5111 Encounter for antineoplastic chemotherapy: Secondary | ICD-10-CM

## 2011-06-15 MED ORDER — SODIUM CHLORIDE 0.9 % IV SOLN
Freq: Once | INTRAVENOUS | Status: AC
  Administered 2011-06-15: 16:00:00 via INTRAVENOUS

## 2011-06-20 ENCOUNTER — Other Ambulatory Visit: Payer: Self-pay | Admitting: Certified Registered Nurse Anesthetist

## 2011-06-22 ENCOUNTER — Ambulatory Visit (HOSPITAL_BASED_OUTPATIENT_CLINIC_OR_DEPARTMENT_OTHER): Payer: Medicare Other | Admitting: Physician Assistant

## 2011-06-22 ENCOUNTER — Encounter: Payer: Self-pay | Admitting: Physician Assistant

## 2011-06-22 ENCOUNTER — Ambulatory Visit (HOSPITAL_BASED_OUTPATIENT_CLINIC_OR_DEPARTMENT_OTHER): Payer: Medicare Other

## 2011-06-22 ENCOUNTER — Other Ambulatory Visit (HOSPITAL_BASED_OUTPATIENT_CLINIC_OR_DEPARTMENT_OTHER): Payer: Medicare Other | Admitting: Lab

## 2011-06-22 VITALS — BP 127/75 | HR 88 | Temp 98.3°F | Ht 66.0 in | Wt 142.8 lb

## 2011-06-22 DIAGNOSIS — E860 Dehydration: Secondary | ICD-10-CM

## 2011-06-22 DIAGNOSIS — C569 Malignant neoplasm of unspecified ovary: Secondary | ICD-10-CM

## 2011-06-22 LAB — CBC WITH DIFFERENTIAL/PLATELET
Basophils Absolute: 0 10*3/uL (ref 0.0–0.1)
EOS%: 0.6 % (ref 0.0–7.0)
Eosinophils Absolute: 0 10*3/uL (ref 0.0–0.5)
HGB: 10.1 g/dL — ABNORMAL LOW (ref 11.6–15.9)
LYMPH%: 17.7 % (ref 14.0–49.7)
MCH: 32.8 pg (ref 25.1–34.0)
MCV: 95.9 fL (ref 79.5–101.0)
MONO%: 9.5 % (ref 0.0–14.0)
NEUT#: 3.1 10*3/uL (ref 1.5–6.5)
NEUT%: 71.9 % (ref 38.4–76.8)
Platelets: 234 10*3/uL (ref 145–400)

## 2011-06-22 LAB — COMPREHENSIVE METABOLIC PANEL
ALT: 16 U/L (ref 0–35)
BUN: 14 mg/dL (ref 6–23)
CO2: 26 mEq/L (ref 19–32)
Calcium: 9.3 mg/dL (ref 8.4–10.5)
Chloride: 94 mEq/L — ABNORMAL LOW (ref 96–112)
Creatinine, Ser: 0.91 mg/dL (ref 0.50–1.10)
Glucose, Bld: 141 mg/dL — ABNORMAL HIGH (ref 70–99)
Total Bilirubin: 0.5 mg/dL (ref 0.3–1.2)

## 2011-06-22 LAB — CA 125: CA 125: 112.4 U/mL — ABNORMAL HIGH (ref 0.0–30.2)

## 2011-06-22 MED ORDER — SODIUM CHLORIDE 0.9 % IV SOLN
INTRAVENOUS | Status: AC
  Administered 2011-06-22: 16:00:00 via INTRAVENOUS

## 2011-06-22 NOTE — Progress Notes (Signed)
ID: Regina West   DOB: [birth year]  MR#: [id number]  OZH#:[id number]  HISTORY OF PRESENT ILLNESS: The patient originally presented in the summer of 2010 with cramps and abdominal distension.  I do not have a copy of the initial evaluation, but on November 26, 2008, the patient underwent optimal debulking with bilateral salpingo-oophorectomy, omentectomy, and the placement of an intraperitoneal port.  The pathology from that procedure (Accession Number [id number] at [facility]) showed first of all, significant involvement of the omentum, minimal involvement of the right ovary and right fallopian tube and negative on the left ovary and fallopian tube, with neither ovary being enlarged, consistent with a primary peritoneal serous adenocarcinoma, described as moderately differentiated.  The sample included no lymph nodes.    The patient had an intraperitoneal port placed in the same surgery and was treated with intraperitoneal and IV chemotherapy according to GOG-252 but I am not sure which arm she was in.  (Arm 2 did carboplatin intraperitoneally and Taxol IV.  Arm 3 did cisplatin and paclitaxel intraperitoneally with paclitaxel IV.)  All arms received bevacizumab.  Unfortunately, after 4 cycles of treatment, she had acute mental status changes and was admitted here January of 2011 with what proved to be posterior reversible leukoencephalopathy and SIADH.  She had seizures, aphasia, and required intubation.  Once the patient recovered from this, she was treated with 2 cycles of single-agent carboplatin (I do not have the AUC). Her last adjuvant treatment was May 12, 2009, and her CA-125 at that time was 10.3.  Her intraperitoneal port was removed in April of 2011.    More recently, the patient was in routine follow up when her CA-125 was found to jump up to 2,269.7 (March 26, 2010).  This is 10 months after her last prior chemotherapy.  She had CTs of the chest, abdomen and pelvis January 26th which showed  ascites and enhancing peritoneal nodularity. She had had similar findings at presentation but these had completely resolved by the time she finished treatment in March of 2011.    The patient was felt to be in first relapse. Subsequent treatments are as summarized below   INTERVAL HISTORY: Regina West  returns today accompanied by her daughter for assessment of chemotoxicity on day 10 cycle 2 of carboplatin/Doxil, given every 28 days. She received supportive IV fluids on days 2 and 3 which she found helpful. She felt fair on days one and 2, "terrible" on days 3 and 4, fairly good for several days, and fills "weak and tired" today.   received one dose of carboplatin and Doxil in mid March. She was hospitalized in thereafter, however, with a small bowel obstruction, admitted on 05/23/2011, and discharged on 06/08/2011. She returns today accompanied by her daughters for followup and assessment, due for her second dose of carboplatin/Doxil today.  She has a home health nurse coming out from [place] on a regular basis which she finds very helpful. She does mention that her blood pressure was low (approximately 100/70) when the nurses there this morning. She did feel little dizzy when she first got up. She admits the she's been unable to keep herself well hydrated last couple of days, and feels increasingly dehydrated today.   REVIEW OF SYSTEMS: Regina West denies any fevers, chills, or night sweats. She's had no rashes and no signs of abnormal bleeding. Her appetite is decreased. She denies any problems with nausea. She is drinking prune juice and this seems to have helped her constipation issues. She's had no cough  or phlegm production. Mild shortness of breath with exertion alone. No unusual headaches. No change in vision. No abdominal or pelvic pain. No peripheral swelling. She continues to have problems with her throat and her voice, with continued "laryngitis" since the NG tube placement during hospitalization.  She's been on omeprazole now for one week which she thinks may have helped "a little bit".  A detailed review of systems is otherwise stable and noncontributory.   PAST MEDICAL HISTORY: Significant for hysterectomy at the age of 11 with concurrent bladder "tuck up."  She also underwent appendectomy during that procedure.  She is status post prior bilateral cataract surgery. She is status post tonsillectomy and adenoidectomy.  She has a history of hypertension and diabetes. She has a history of diverticulosis.  She has a large hiatal hernia.  She has degenerative disk disease.  She has been noted to have coronary calcifications and she has hyperlipidemia.  There is a history of remote tobacco abuse. Seizure disorder as per HPI  FAMILY HISTORY The patient's father died at the age of 1 after heart surgery for aortic stenosis.  The patient's mother died at the age of 66.  The patient has one brother in good health.  There is no other family history to her knowledge of ovarian or breast cancer.    GYNECOLOGIC HISTORY: Menarche at age 7.  She is GX P3 with menopause in her late 11s.  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  Her husband was in sales and had a history of Parkinson's disease.  He died following a fall in the year 2000.  The patient's son Regina West lives in [place], and has a history of MS.  Daughter Regina West lives in [place].  She is a Futures trader.  Son Regina West lives in [place], [place] and is in Airline pilot. The patient has 8 grandchildren. She attends the [employer].  She is a good friend of our patients [name]   ADVANCED DIRECTIVES: in place  HEALTH MAINTENANCE: History  Substance Use Topics  . Smoking status: Former Smoker    Quit date: 05/24/1959  . Smokeless tobacco: Never Used  . Alcohol Use: No     Colonoscopy: 2008  PAP: UTD  MM:  refuses  Allergies  Allergen Reactions  . Avastin (Bevacizumab) Other (See Comments)    Swelling of the brain     Current Outpatient Prescriptions  Medication Sig Dispense Refill  . antiseptic oral rinse (BIOTENE) LIQD 15 mLs by Mouth Rinse route 2 (two) times daily.  1 Bottle  0  . cholecalciferol (VITAMIN D) 1000 UNITS tablet Take 1,000 Units by mouth 2 (two) times daily.      Regina West Kitchen escitalopram (LEXAPRO) 10 MG tablet       . levETIRAcetam (KEPPRA) 500 MG tablet Take 250 mg by mouth every 12 (twelve) hours. Pt takes 1/2 tab for 250 mg dose      . lidocaine-prilocaine (EMLA) cream Apply 1 application topically as needed.      . metoCLOPramide (REGLAN) 5 MG tablet Take 1 tablet (5 mg total) by mouth 3 (three) times daily before meals.  90 tablet  3  . mirtazapine (REMERON SOL-TAB) 15 MG disintegrating tablet Take 1 tablet (15 mg total) by mouth at bedtime.  30 tablet  3  . omeprazole (PRILOSEC) 40 MG capsule Take 1 capsule (40 mg total) by  mouth at bedtime.  30 capsule  3  . ondansetron (ZOFRAN-ODT) 8 MG disintegrating tablet Take 1 tablet (8 mg total) by mouth every 8 (eight) hours as needed. nausea  20 tablet  3  . PRESCRIPTION MEDICATION Pt gets chemo at [facility]. Last treatment was on 05-16-11 followed by Dr Bevelyn Buckles      . prochlorperazine (COMPAZINE) 10 MG tablet Take 1 tablet (10 mg total) by mouth every 6 (six) hours as needed.  30 tablet  0  . prochlorperazine (COMPAZINE) 10 MG tablet        Current Facility-Administered Medications  Medication Dose Route Frequency Provider Last Rate Last Dose  . 0.9 %  sodium chloride infusion   Intravenous Continuous Timm Bonenberger Allegra Grana, PA        OBJECTIVE: Elderly white woman who appears anxious Filed Vitals:   06/22/11 1355  BP: 127/75  Pulse: 88  Temp: 98.3 F (36.8 C)     Body mass index is 23.05 kg/(m^2).    ECOG FS: 2  Sclerae unicteric Oropharynx clear, no evidence of mucositis or candidiasis. No peripheral adenopathy Lungs clear to  auscultation, no rales or rhonchi; fair excursion bilaterally Heart regular rate and rhythm, no gallops, murmurs, or rubs. Abd only mildly distended, not terse, positive bowel sounds x4, nontender, no organomegaly, no masses palpated.  MSK no focal spinal tenderness, no peripheral edema Skin is benign but with poor skin turgor. Neuro: nonfocal  LAB RESULTS:  Lab Results  Component Value Date   CA125 144.4* 06/08/2011   this is a drop from 426.8 just 3 weeks ago, after only one cycle of chemotherapy.  Lab Results  Component Value Date   WBC 4.3 06/22/2011   NEUTROABS 3.1 06/22/2011   HGB 10.1* 06/22/2011   HCT 29.6* 06/22/2011   MCV 95.9 06/22/2011   PLT 234 06/22/2011      Chemistry      Component Value Date/Time   NA 132* 06/22/2011 1335   NA 143 05/12/2011 1337   K 3.6 06/22/2011 1335   K 4.1 05/12/2011 1337   CL 94* 06/22/2011 1335   CL 92* 05/12/2011 1337   CO2 26 06/22/2011 1335   CO2 32 05/12/2011 1337   BUN 14 06/22/2011 1335   BUN 19 05/12/2011 1337   CREATININE 0.91 06/22/2011 1335   CREATININE 1.3* 05/12/2011 1337      Component Value Date/Time   CALCIUM 9.3 06/22/2011 1335   CALCIUM 9.8 05/12/2011 1337   ALKPHOS 119* 06/22/2011 1335   ALKPHOS 95* 05/12/2011 1337   AST 23 06/22/2011 1335   AST 40* 05/12/2011 1337   ALT 16 06/22/2011 1335   BILITOT 0.5 06/22/2011 1335   BILITOT 0.60 05/12/2011 1337      STUDIES: No recent studies.  ASSESSMENT: 78 year old [place] woman   (1) status post optimal debulking of a primary peritoneal serous adenocarcinoma September 2010, the tumor being moderately differentiated, pT3c NX (stage IIIC), treated according to GOG 252 with  intraperitoneal platinum and paclitaxel x4 given along with bevacizumab, complicated by SIADHafter cycle 4, also with posterior reversible leukoencephalopathy.  After these problems resolved she received 2 additional cycles of single-agent carboplatin completed in March 2011  (2)  She had her first recurrence February 2012   treated with carboplatin and Gemzar for a total of 6 cycles between February and June 2012.  Her CEA-125 had normalized before the beginning of cycle 5.   (3)  now with second recurrence noted by CT, in addition to an increase  in the CA 125 in late February 2013. Currently being treated with carboplatin and Doxil, due for day 1 cycle 2 today, to be given every 28 days.  PLAN:  Regina West will receive 500 mL of normal saline for supportive measures today. I have encouraged her to keep herself well hydrated, but she knows to call if this continues to be an issue. She is already scheduled to return in 3 weeks, May 6, for repeat labs, physical exam, and her third dose of carboplatin/Doxil. We will plan on giving her supportive IV fluids on days 2 and 3 as with cycle 2.  We will continue to follow Regina West closely, and we'll check her CA 125 a monthly basis.  Regina West and her daughters all voice understanding and agreement with this plan. They do understand to call with any changes or problems.   Before she returns to see Dr. Nedra Hai she will have a repeat CT of the abdomen and pelvis as well as a chest x-ray   [facility]    06/22/2011

## 2011-06-27 ENCOUNTER — Other Ambulatory Visit: Payer: Self-pay | Admitting: *Deleted

## 2011-06-27 DIAGNOSIS — C569 Malignant neoplasm of unspecified ovary: Secondary | ICD-10-CM

## 2011-07-04 MED ORDER — ACYCLOVIR 400 MG PO TABS: 400.0000 mg | ORAL_TABLET | Freq: Two times a day (BID) | ORAL | Status: AC

## 2011-07-08 ENCOUNTER — Other Ambulatory Visit: Payer: Self-pay | Admitting: *Deleted

## 2011-07-11 ENCOUNTER — Other Ambulatory Visit: Payer: Self-pay | Admitting: Physician Assistant

## 2011-07-11 ENCOUNTER — Telehealth: Payer: Self-pay | Admitting: Oncology

## 2011-07-11 ENCOUNTER — Ambulatory Visit (HOSPITAL_BASED_OUTPATIENT_CLINIC_OR_DEPARTMENT_OTHER): Payer: Medicare Other

## 2011-07-11 ENCOUNTER — Telehealth: Payer: Self-pay | Admitting: *Deleted

## 2011-07-11 ENCOUNTER — Other Ambulatory Visit (HOSPITAL_BASED_OUTPATIENT_CLINIC_OR_DEPARTMENT_OTHER): Payer: Medicare Other

## 2011-07-11 ENCOUNTER — Ambulatory Visit (HOSPITAL_BASED_OUTPATIENT_CLINIC_OR_DEPARTMENT_OTHER): Payer: Medicare Other | Admitting: Physician Assistant

## 2011-07-11 ENCOUNTER — Encounter: Payer: Self-pay | Admitting: Physician Assistant

## 2011-07-11 ENCOUNTER — Ambulatory Visit: Payer: Medicare Other

## 2011-07-11 VITALS — BP 98/61 | HR 78 | Temp 97.7°F | Ht 66.0 in | Wt 145.8 lb

## 2011-07-11 DIAGNOSIS — C569 Malignant neoplasm of unspecified ovary: Secondary | ICD-10-CM

## 2011-07-11 DIAGNOSIS — Z5111 Encounter for antineoplastic chemotherapy: Secondary | ICD-10-CM

## 2011-07-11 LAB — CBC WITH DIFFERENTIAL/PLATELET
Basophils Absolute: 0 10*3/uL (ref 0.0–0.1)
EOS%: 0.9 % (ref 0.0–7.0)
Eosinophils Absolute: 0 10*3/uL (ref 0.0–0.5)
HCT: 29.5 % — ABNORMAL LOW (ref 34.8–46.6)
HGB: 10 g/dL — ABNORMAL LOW (ref 11.6–15.9)
MCH: 32.8 pg (ref 25.1–34.0)
MCV: 96.7 fL (ref 79.5–101.0)
NEUT#: 1.9 10*3/uL (ref 1.5–6.5)
NEUT%: 54.7 % (ref 38.4–76.8)
RDW: 19 % — ABNORMAL HIGH (ref 11.2–14.5)
lymph#: 0.6 10*3/uL — ABNORMAL LOW (ref 0.9–3.3)

## 2011-07-11 LAB — COMPREHENSIVE METABOLIC PANEL
Albumin: 3.5 g/dL (ref 3.5–5.2)
BUN: 18 mg/dL (ref 6–23)
CO2: 26 mEq/L (ref 19–32)
Calcium: 9.8 mg/dL (ref 8.4–10.5)
Glucose, Bld: 103 mg/dL — ABNORMAL HIGH (ref 70–99)
Potassium: 4.2 mEq/L (ref 3.5–5.3)
Sodium: 135 mEq/L (ref 135–145)
Total Protein: 6.2 g/dL (ref 6.0–8.3)

## 2011-07-11 LAB — CA 125: CA 125: 42.7 U/mL — ABNORMAL HIGH (ref 0.0–30.2)

## 2011-07-11 MED ORDER — DOXORUBICIN HCL LIPOSOMAL CHEMO INJECTION 2 MG/ML
30.0000 mg/m2 | Freq: Once | INTRAVENOUS | Status: AC
  Administered 2011-07-11: 54 mg via INTRAVENOUS
  Filled 2011-07-11: qty 27

## 2011-07-11 MED ORDER — SODIUM CHLORIDE 0.9 % IJ SOLN
10.0000 mL | INTRAMUSCULAR | Status: DC | PRN
  Filled 2011-07-11: qty 10

## 2011-07-11 MED ORDER — DEXAMETHASONE SODIUM PHOSPHATE 4 MG/ML IJ SOLN
20.0000 mg | Freq: Once | INTRAMUSCULAR | Status: AC
  Administered 2011-07-11: 20 mg via INTRAVENOUS

## 2011-07-11 MED ORDER — SODIUM CHLORIDE 0.9 % IV SOLN
300.0000 mg | Freq: Once | INTRAVENOUS | Status: AC
  Administered 2011-07-11: 300 mg via INTRAVENOUS
  Filled 2011-07-11: qty 30

## 2011-07-11 MED ORDER — SODIUM CHLORIDE 0.9 % IJ SOLN
100.0000 ug | Freq: Once | INTRAVENOUS | Status: AC
  Administered 2011-07-11: 0.1 mg via INTRADERMAL
  Filled 2011-07-11: qty 0.01

## 2011-07-11 MED ORDER — SODIUM CHLORIDE 0.9 % IV SOLN
Freq: Once | INTRAVENOUS | Status: AC
  Administered 2011-07-11: 16:00:00 via INTRAVENOUS

## 2011-07-11 MED ORDER — DEXTROSE 5 % IV SOLN: Freq: Once | INTRAVENOUS | Status: DC

## 2011-07-11 MED ORDER — ONDANSETRON 16 MG/50ML IVPB (CHCC)
16.0000 mg | Freq: Once | INTRAVENOUS | Status: AC
  Administered 2011-07-11: 16 mg via INTRAVENOUS

## 2011-07-11 MED ORDER — HEPARIN SOD (PORK) LOCK FLUSH 100 UNIT/ML IV SOLN
500.0000 [IU] | Freq: Once | INTRAVENOUS | Status: DC | PRN
  Filled 2011-07-11: qty 5

## 2011-07-11 NOTE — Patient Instructions (Signed)
[facility] Discharge Instructions for Patients Receiving Chemotherapy  Today you received the following chemotherapy agents:  Doxil,  Carboplatin.  To help prevent nausea and vomiting after your treatment, we encourage you to take your nausea medication as instructed by your physician,  and take it as often as prescribed for the next 2 days and as needed.   If you develop nausea and vomiting that is not controlled by your nausea medication, call the clinic. If it is after clinic hours your family physician or the after hours number for the clinic or go to the Emergency Department.   BELOW ARE SYMPTOMS THAT SHOULD BE REPORTED IMMEDIATELY:  *FEVER GREATER THAN 100.5 F  *CHILLS WITH OR WITHOUT FEVER  NAUSEA AND VOMITING THAT IS NOT CONTROLLED WITH YOUR NAUSEA MEDICATION  *UNUSUAL SHORTNESS OF BREATH  *UNUSUAL BRUISING OR BLEEDING  TENDERNESS IN MOUTH AND THROAT WITH OR WITHOUT PRESENCE OF ULCERS  *URINARY PROBLEMS  *BOWEL PROBLEMS  UNUSUAL RASH Items with * indicate a potential emergency and should be followed up as soon as possible.  One of the nurses will contact you 24 hours after your treatment. Please let the nurse know about any problems that you may have experienced. Feel free to call the clinic you have any questions or concerns. The clinic phone number is [phone].   I have been informed and understand all the instructions given to me. I know to contact the clinic, my physician, or go to the Emergency Department if any problems should occur. I do not have any questions at this time, but understand that I may call the clinic during office hours   should I have any questions or need assistance in obtaining follow up care.    __________________________________________  _____________  __________ Signature of Patient or Authorized Representative            Date                   Time    __________________________________________ Nurse's  Signature

## 2011-07-11 NOTE — Progress Notes (Signed)
ID: Regina West   DOB: 16-Jul-1929  MR#: [id number]  NWG#:[id number]  HISTORY OF PRESENT ILLNESS: The patient originally presented in the summer of 2010 with cramps and abdominal distension.  I do not have a copy of the initial evaluation, but on November 26, 2008, the patient underwent optimal debulking with bilateral salpingo-oophorectomy, omentectomy, and the placement of an intraperitoneal port.  The pathology from that procedure (Accession Number [id number] at [facility]) showed first of all, significant involvement of the omentum, minimal involvement of the right ovary and right fallopian tube and negative on the left ovary and fallopian tube, with neither ovary being enlarged, consistent with a primary peritoneal serous adenocarcinoma, described as moderately differentiated.  The sample included no lymph nodes.    The patient had an intraperitoneal port placed in the same surgery and was treated with intraperitoneal and IV chemotherapy according to GOG-252 but I am not sure which arm she was in.  (Arm 2 did carboplatin intraperitoneally and Taxol IV.  Arm 3 did cisplatin and paclitaxel intraperitoneally with paclitaxel IV.)  All arms received bevacizumab.  Unfortunately, after 4 cycles of treatment, she had acute mental status changes and was admitted here January of 2011 with what proved to be posterior reversible leukoencephalopathy and SIADH.  She had seizures, aphasia, and required intubation.  Once the patient recovered from this, she was treated with 2 cycles of single-agent carboplatin (I do not have the AUC). Her last adjuvant treatment was May 12, 2009, and her CA-125 at that time was 10.3.  Her intraperitoneal port was removed in April of 2011.    More recently, the patient was in routine follow up when her CA-125 was found to jump up to 2,269.7 (March 26, 2010).  This is 10 months after her last prior chemotherapy.  She had CTs of the chest, abdomen and pelvis January 26th which showed  ascites and enhancing peritoneal nodularity. She had had similar findings at presentation but these had completely resolved by the time she finished treatment in March of 2011.    The patient was felt to be in first relapse. Subsequent treatments are as summarized below   INTERVAL HISTORY: Anara  returns today accompanied by her daughters for followup of her ovarian carcinoma. She is due for day 1, cycle 3 of carboplatin/Doxil, given every 28 days. She receives supportive IV fluids on days 2 and 3 which she finds helpful. She feels that she has recovered well from cycle 2, and is ready to proceed with treatment today.  REVIEW OF SYSTEMS: Mallissa denies any fevers, chills, or night sweats. She has developed a rash on the upper part of her back that she would like for [place] to look at today. The area itches, but is not particularly painful. It seems to be gradually improving over the past few days.  Merryn's energy level is fair, although it is improving with time. She's eating and drinking a little better although her appetite is still decreased. She's had no nausea and no additional problems with constipation. She has some shortness of breath with exertion which has not increased, but denies a cough. Her "laryngitis" has almost completely resolved. She denies any abnormal headaches. She's had no abdominal or pelvic pain, and denies any unusual myalgias, arthralgias, bony pain. No peripheral swelling or peripheral neuropathy.  A detailed review of systems is otherwise stable and noncontributory.   PAST MEDICAL HISTORY: Significant for hysterectomy at the age of 89 with concurrent bladder "tuck up."  She also underwent  appendectomy during that procedure.  She is status post prior bilateral cataract surgery. She is status post tonsillectomy and adenoidectomy.  She has a history of hypertension and diabetes. She has a history of diverticulosis.  She has a large hiatal hernia.  She has degenerative disk  disease.  She has been noted to have coronary calcifications and she has hyperlipidemia.  There is a history of remote tobacco abuse. Seizure disorder as per HPI  FAMILY HISTORY The patient's father died at the age of 13 after heart surgery for aortic stenosis.  The patient's mother died at the age of 73.  The patient has one brother in good health.  There is no other family history to her knowledge of ovarian or breast cancer.    GYNECOLOGIC HISTORY: Menarche at age 31.  She is GX P3 with menopause in her late 58s.  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  Her husband was in sales and had a history of Parkinson's disease.  He died following a fall in the year 2000.  The patient's son [name] lives in [place], and has a history of MS.  Daughter [name] lives in [place].  She is a Futures trader.  Son [name] lives in [place], [place] and is in Airline pilot. The patient has 8 grandchildren. She attends the [employer].  She is a good friend of our patients [name]   ADVANCED DIRECTIVES: in place  HEALTH MAINTENANCE: History  Substance Use Topics  . Smoking status: Former Smoker    Quit date: 05/24/1959  . Smokeless tobacco: Never Used  . Alcohol Use: No     Colonoscopy: 2008  PAP: UTD  MM: refuses  Allergies  Allergen Reactions  . Avastin (Bevacizumab) Other (See Comments)    Swelling of the brain     Current Outpatient Prescriptions  Medication Sig Dispense Refill  . acyclovir (ZOVIRAX) 400 MG tablet Take 400 mg by mouth 2 (two) times daily.      Marland Kitchen antiseptic oral rinse (BIOTENE) LIQD 15 mLs by Mouth Rinse route 2 (two) times daily.  1 Bottle  0  . cholecalciferol (VITAMIN D) 1000 UNITS tablet Take 1,000 Units by mouth 2 (two) times daily.      Marland Kitchen escitalopram (LEXAPRO) 10 MG tablet       . levETIRAcetam (KEPPRA) 500 MG tablet Take 250 mg by mouth every  12 (twelve) hours. Pt takes 1/2 tab for 250 mg dose      . lidocaine-prilocaine (EMLA) cream Apply 1 application topically as needed.      . metoCLOPramide (REGLAN) 5 MG tablet Take 1 tablet (5 mg total) by mouth 3 (three) times daily before meals.  90 tablet  3  . mirtazapine (REMERON SOL-TAB) 15 MG disintegrating tablet Take 1 tablet (15 mg total) by mouth at bedtime.  30 tablet  3  . omeprazole (PRILOSEC) 40 MG capsule Take 1 capsule (40 mg total) by mouth at bedtime.  30 capsule  3  . ondansetron (ZOFRAN-ODT) 8 MG disintegrating tablet Take 1 tablet (8 mg total) by mouth every 8 (eight) hours as needed. nausea  20 tablet  3  . PRESCRIPTION MEDICATION Pt gets chemo at [facility]. Last treatment was on 05-16-11 followed by Dr Bevelyn Buckles      . prochlorperazine (COMPAZINE) 10 MG tablet Take 1 tablet (10 mg total) by mouth every 6 (six)  hours as needed.  30 tablet  0  . prochlorperazine (COMPAZINE) 10 MG tablet        No current facility-administered medications for this visit.   Facility-Administered Medications Ordered in Other Visits  Medication Dose Route Frequency Provider Last Rate Last Dose  . CARBOplatin (PARAPLATIN) 300 mg in sodium chloride 0.9 % 100 mL chemo infusion  300 mg Intravenous Once Lowella Dell, MD      . CARBOplatin chemo intradermal Test Dose 100 mcg  100 mcg Intradermal Once Kongmeng Santoro G Alyanna Stoermer, PA      . dexamethasone (DECADRON) injection 20 mg  20 mg Intravenous Once Gaspar Fowle G Sharece Fleischhacker, PA      . dextrose 5 % solution   Intravenous Once Demarus Latterell G Cattaleya Wien, PA      . DOXOrubicin HCL LIPOSOMAL (DOXIL) 54 mg in dextrose 5 % 250 mL chemo infusion  30 mg/m2 (Treatment Plan Actual) Intravenous Once Laurine Kuyper Allegra Grana, PA      . heparin lock flush 100 unit/mL  500 Units Intracatheter Once PRN Catalina Gravel, PA      . ondansetron (ZOFRAN) IVPB 16 mg  16 mg Intravenous Once Devetta Hagenow G Caige Almeda, PA      . sodium chloride 0.9 % injection 10 mL  10 mL Intracatheter PRN Bucky Grigg Allegra Grana, PA        OBJECTIVE: Elderly white woman  who appears anxious Filed Vitals:   07/11/11 1322  BP: 98/61  Pulse: 78  Temp: 97.7 F (36.5 C)     Body mass index is 23.53 kg/(m^2).    ECOG FS: 2 Physical Exam: HEENT:  Sclerae anicteric, conjunctivae pink.  Oropharynx notable for one small ulceration inside the lower lip.  No candidiasis.   Nodes:  No cervical, supraclavicular, or axillary lymphadenopathy palpated.  Breast Exam:  Deferred   Lungs:  Clear to auscultation bilaterally.  No crackles, rhonchi, or wheezes.   Heart:  Regular rate and rhythm.   Abdomen:  Soft, nondistended, nontender.  Positive bowel sounds.  No organomegaly or masses palpated.   Musculoskeletal:  No focal spinal tenderness to palpation.  Extremities:  Benign.  No peripheral edema or cyanosis.   Skin:  Skin exam reveals a localized erythematous rash in the upper back, and spread into the upper left shoulder. There no obvious pustules, or vesicles at this time. Some of the lesions are scabbed over. Skin is otherwise benign.   Neuro:  Nonfocal. Alert and oriented x3   LAB RESULTS:  Lab Results  Component Value Date   CA125 112.4* 06/22/2011   This is a drop from 426.8 in March weeks ago, after only one cycle of chemotherapy.  CA125 and CMET are both being repeated today, 07/11/2011, results pending.  Lab Results  Component Value Date   WBC 3.5* 07/11/2011   NEUTROABS 1.9 07/11/2011   HGB 10.0* 07/11/2011   HCT 29.5* 07/11/2011   MCV 96.7 07/11/2011   PLT 204 07/11/2011      Chemistry      Component Value Date/Time   NA 132* 06/22/2011 1335   NA 143 05/12/2011 1337   K 3.6 06/22/2011 1335   K 4.1 05/12/2011 1337   CL 94* 06/22/2011 1335   CL 92* 05/12/2011 1337   CO2 26 06/22/2011 1335   CO2 32 05/12/2011 1337   BUN 14 06/22/2011 1335   BUN 19 05/12/2011 1337   CREATININE 0.91 06/22/2011 1335   CREATININE 1.3* 05/12/2011 1337      Component Value Date/Time  CALCIUM 9.3 06/22/2011 1335   CALCIUM 9.8 05/12/2011 1337   ALKPHOS 119* 06/22/2011 1335   ALKPHOS 95*  05/12/2011 1337   AST 23 06/22/2011 1335   AST 40* 05/12/2011 1337   ALT 16 06/22/2011 1335   BILITOT 0.5 06/22/2011 1335   BILITOT 0.60 05/12/2011 1337      STUDIES: No recent studies.  ASSESSMENT: 78 year old [place] woman   (1) status post optimal debulking of a primary peritoneal serous adenocarcinoma September 2010, the tumor being moderately differentiated, pT3c NX (stage IIIC), treated according to GOG 252 with  intraperitoneal platinum and paclitaxel x4 given along with bevacizumab, complicated by SIADHafter cycle 4, also with posterior reversible leukoencephalopathy.  After these problems resolved she received 2 additional cycles of single-agent carboplatin completed in March 2011  (2)  She had her first recurrence February 2012  treated with carboplatin and Gemzar for a total of 6 cycles between February and June 2012.  Her CEA-125 had normalized before the beginning of cycle 5.   (3)  now with second recurrence noted by CT, in addition to an increase in the CA 125 in late February 2013. Currently being treated with carboplatin and Doxil, due for day 1 cycle 2 today, to be given every 28 days.  PLAN:  Keyona will proceed to infusion today for day 1 cycle 3 of carboplatin/Doxil. She'll return for supportive IV fluids on days 2 and 3.  We will continue with this regimen, the next cycle being scheduled for June 3. Just prior to that appointment, she'll have a repeat CT of the abdomen and pelvis and will followup with Dr. Nedra Hai. We'll continue to follow her CA 125 at least monthly.  It looks as if she has developed a mild case of shingles on the upper back. This is very localized from the spinal column to the left scapular area. The area does seem to be healing well. She'll continue on her acyclovir twice a day, but will let us know if this rash spreads or worsens.  Jennalyn will call us with any changes or problems. She and her daughters voice understanding and agreement with this  plan.  Gidget Quizhpi    07/11/2011

## 2011-07-11 NOTE — Telephone Encounter (Signed)
Regina West will gve the pt her may and June 2013 appt calendar. Pt has tx today. Sent michelle an onc tx detail

## 2011-07-11 NOTE — Telephone Encounter (Signed)
Per staff message from [place], I have scheduled the appts. Crystal aware.  JMW

## 2011-07-12 ENCOUNTER — Telehealth: Payer: Self-pay | Admitting: Oncology

## 2011-07-12 ENCOUNTER — Ambulatory Visit (HOSPITAL_BASED_OUTPATIENT_CLINIC_OR_DEPARTMENT_OTHER): Payer: Medicare Other

## 2011-07-12 VITALS — BP 103/59 | HR 72

## 2011-07-12 DIAGNOSIS — C569 Malignant neoplasm of unspecified ovary: Secondary | ICD-10-CM

## 2011-07-12 MED ORDER — SODIUM CHLORIDE 0.9 % IV SOLN
INTRAVENOUS | Status: AC
  Administered 2011-07-12: 15:00:00 via INTRAVENOUS

## 2011-07-12 MED ORDER — HEPARIN SOD (PORK) LOCK FLUSH 100 UNIT/ML IV SOLN
500.0000 [IU] | Freq: Once | INTRAVENOUS | Status: AC
  Administered 2011-07-12: 500 [IU] via INTRAVENOUS
  Filled 2011-07-12: qty 5

## 2011-07-12 MED ORDER — SODIUM CHLORIDE 0.9 % IJ SOLN
10.0000 mL | INTRAMUSCULAR | Status: DC | PRN
  Administered 2011-07-12: 10 mL via INTRAVENOUS
  Filled 2011-07-12: qty 10

## 2011-07-12 NOTE — Patient Instructions (Addendum)
[facility] Discharge Instructions for Patients   Today you received Normal Saline Solution for hydration.  Please continue to drink plenty of fluids and eat foods that are appealing to your taste buds.    If you develop nausea and vomiting that is not controlled by your nausea medication, call the clinic. If it is after clinic hours your family physician or the after hours number for the clinic or go to the Emergency Department.   BELOW ARE SYMPTOMS THAT SHOULD BE REPORTED IMMEDIATELY:  *FEVER GREATER THAN 100.5 F  *CHILLS WITH OR WITHOUT FEVER  NAUSEA AND VOMITING THAT IS NOT CONTROLLED WITH YOUR NAUSEA MEDICATION  *UNUSUAL SHORTNESS OF BREATH  *UNUSUAL BRUISING OR BLEEDING  TENDERNESS IN MOUTH AND THROAT WITH OR WITHOUT PRESENCE OF ULCERS  *URINARY PROBLEMS  *BOWEL PROBLEMS  UNUSUAL RASH Items with * indicate a potential emergency and should be followed up as soon as possible.  . Feel free to call the clinic you have any questions or concerns. The clinic phone number is [phone].   I have been informed and understand all the instructions given to me. I know to contact the clinic, my physician, or go to the Emergency Department if any problems should occur. I do not have any questions at this time, but understand that I may call the clinic during office hours   should I have any questions or need assistance in obtaining follow up care.    __________________________________________  _____________  __________ Signature of Patient or Authorized Representative            Date                   Time    __________________________________________ Nurse's Signature

## 2011-07-12 NOTE — Telephone Encounter (Signed)
S/w the pt and she is aware of her iv fluid appt for today and to pick up an appt calendar when she comes in the office

## 2011-07-13 ENCOUNTER — Ambulatory Visit (HOSPITAL_BASED_OUTPATIENT_CLINIC_OR_DEPARTMENT_OTHER): Payer: Medicare Other

## 2011-07-13 VITALS — BP 129/75 | HR 71 | Temp 98.1°F

## 2011-07-13 DIAGNOSIS — C569 Malignant neoplasm of unspecified ovary: Secondary | ICD-10-CM

## 2011-07-13 MED ORDER — SODIUM CHLORIDE 0.9 % IJ SOLN
10.0000 mL | INTRAMUSCULAR | Status: DC | PRN
  Administered 2011-07-13: 10 mL via INTRAVENOUS
  Filled 2011-07-13: qty 10

## 2011-07-13 MED ORDER — SODIUM CHLORIDE 0.9 % IV SOLN
INTRAVENOUS | Status: AC
  Administered 2011-07-13: 09:00:00 via INTRAVENOUS

## 2011-07-13 MED ORDER — HEPARIN SOD (PORK) LOCK FLUSH 100 UNIT/ML IV SOLN
500.0000 [IU] | Freq: Once | INTRAVENOUS | Status: AC
  Administered 2011-07-13: 500 [IU] via INTRAVENOUS
  Filled 2011-07-13: qty 5

## 2011-07-13 NOTE — Patient Instructions (Signed)
[facility] Discharge Instructions   Today you received IV fluids.   If you develop nausea and vomiting that is not controlled by your nausea medication, call the clinic. If it is after clinic hours your family physician or the after hours number for the clinic or go to the Emergency Department.   BELOW ARE SYMPTOMS THAT SHOULD BE REPORTED IMMEDIATELY:  *FEVER GREATER THAN 100.5 F  *CHILLS WITH OR WITHOUT FEVER  NAUSEA AND VOMITING THAT IS NOT CONTROLLED WITH YOUR NAUSEA MEDICATION  *UNUSUAL SHORTNESS OF BREATH  *UNUSUAL BRUISING OR BLEEDING  TENDERNESS IN MOUTH AND THROAT WITH OR WITHOUT PRESENCE OF ULCERS  *URINARY PROBLEMS  *BOWEL PROBLEMS  UNUSUAL RASH Items with * indicate a potential emergency and should be followed up as soon as possible.  One of the nurses will contact you 24 hours after your treatment. Please let the nurse know about any problems that you may have experienced. Feel free to call the clinic you have any questions or concerns. The clinic phone number is [phone].   I have been informed and understand all the instructions given to me. I know to contact the clinic, my physician, or go to the Emergency Department if any problems should occur. I do not have any questions at this time, but understand that I may call the clinic during office hours   should I have any questions or need assistance in obtaining follow up care.    __________________________________________  _____________  __________ Signature of Patient or Authorized Representative            Date                   Time    __________________________________________ Nurse's Signature

## 2011-07-18 ENCOUNTER — Other Ambulatory Visit: Payer: Self-pay

## 2011-07-18 ENCOUNTER — Other Ambulatory Visit: Payer: Self-pay | Admitting: Physician Assistant

## 2011-07-18 DIAGNOSIS — C569 Malignant neoplasm of unspecified ovary: Secondary | ICD-10-CM

## 2011-07-18 MED ORDER — METOCLOPRAMIDE HCL 5 MG PO TABS: 5.0000 mg | ORAL_TABLET | Freq: Three times a day (TID) | ORAL | Status: DC

## 2011-07-25 ENCOUNTER — Encounter (HOSPITAL_COMMUNITY): Payer: Self-pay

## 2011-07-25 ENCOUNTER — Ambulatory Visit (HOSPITAL_COMMUNITY)
Admission: RE | Admit: 2011-07-25 | Discharge: 2011-07-25 | Disposition: A | Discharge status: Home / Self Care | Payer: Medicare Other | Source: Ambulatory Visit | Attending: Physician Assistant | Admitting: Physician Assistant

## 2011-07-25 DIAGNOSIS — C569 Malignant neoplasm of unspecified ovary: Secondary | ICD-10-CM | POA: Insufficient documentation

## 2011-07-25 DIAGNOSIS — K7689 Other specified diseases of liver: Secondary | ICD-10-CM | POA: Insufficient documentation

## 2011-07-25 DIAGNOSIS — K449 Diaphragmatic hernia without obstruction or gangrene: Secondary | ICD-10-CM | POA: Insufficient documentation

## 2011-07-25 DIAGNOSIS — Z90710 Acquired absence of both cervix and uterus: Secondary | ICD-10-CM | POA: Insufficient documentation

## 2011-07-25 MED ORDER — IOHEXOL 300 MG/ML  SOLN
100.0000 mL | Freq: Once | INTRAMUSCULAR | Status: AC | PRN
  Administered 2011-07-25: 100 mL via INTRAVENOUS

## 2011-08-08 ENCOUNTER — Ambulatory Visit (HOSPITAL_BASED_OUTPATIENT_CLINIC_OR_DEPARTMENT_OTHER): Payer: Medicare Other | Admitting: Oncology

## 2011-08-08 ENCOUNTER — Other Ambulatory Visit: Payer: Self-pay | Admitting: Oncology

## 2011-08-08 ENCOUNTER — Ambulatory Visit (HOSPITAL_BASED_OUTPATIENT_CLINIC_OR_DEPARTMENT_OTHER): Payer: Medicare Other

## 2011-08-08 ENCOUNTER — Telehealth: Payer: Self-pay | Admitting: *Deleted

## 2011-08-08 ENCOUNTER — Other Ambulatory Visit (HOSPITAL_BASED_OUTPATIENT_CLINIC_OR_DEPARTMENT_OTHER): Payer: Medicare Other | Admitting: Lab

## 2011-08-08 ENCOUNTER — Telehealth: Payer: Self-pay | Admitting: Oncology

## 2011-08-08 VITALS — BP 115/73 | HR 78 | Temp 98.6°F | Ht 66.0 in | Wt 141.5 lb

## 2011-08-08 DIAGNOSIS — Z5111 Encounter for antineoplastic chemotherapy: Secondary | ICD-10-CM

## 2011-08-08 DIAGNOSIS — C569 Malignant neoplasm of unspecified ovary: Secondary | ICD-10-CM

## 2011-08-08 DIAGNOSIS — Z452 Encounter for adjustment and management of vascular access device: Secondary | ICD-10-CM

## 2011-08-08 DIAGNOSIS — H579 Unspecified disorder of eye and adnexa: Secondary | ICD-10-CM

## 2011-08-08 LAB — CBC WITH DIFFERENTIAL/PLATELET
BASO%: 0.7 % (ref 0.0–2.0)
LYMPH%: 23.7 % (ref 14.0–49.7)
MCHC: 34.3 g/dL (ref 31.5–36.0)
MONO#: 0.9 10*3/uL (ref 0.1–0.9)
NEUT#: 1.4 10*3/uL — ABNORMAL LOW (ref 1.5–6.5)
RBC: 2.87 10*6/uL — ABNORMAL LOW (ref 3.70–5.45)
RDW: 20.2 % — ABNORMAL HIGH (ref 11.2–14.5)
WBC: 3 10*3/uL — ABNORMAL LOW (ref 3.9–10.3)
lymph#: 0.7 10*3/uL — ABNORMAL LOW (ref 0.9–3.3)
nRBC: 0 % (ref 0–0)

## 2011-08-08 LAB — COMPREHENSIVE METABOLIC PANEL
ALT: 11 U/L (ref 0–35)
AST: 21 U/L (ref 0–37)
Albumin: 3.6 g/dL (ref 3.5–5.2)
Alkaline Phosphatase: 87 U/L (ref 39–117)
BUN: 21 mg/dL (ref 6–23)
Potassium: 4.2 mEq/L (ref 3.5–5.3)
Sodium: 136 mEq/L (ref 135–145)

## 2011-08-08 LAB — CA 125: CA 125: 25.2 U/mL (ref 0.0–30.2)

## 2011-08-08 MED ORDER — DEXAMETHASONE SODIUM PHOSPHATE 4 MG/ML IJ SOLN
20.0000 mg | Freq: Once | INTRAMUSCULAR | Status: AC
  Administered 2011-08-08: 20 mg via INTRAVENOUS

## 2011-08-08 MED ORDER — HEPARIN SOD (PORK) LOCK FLUSH 100 UNIT/ML IV SOLN
500.0000 [IU] | Freq: Once | INTRAVENOUS | Status: AC | PRN
  Administered 2011-08-08: 500 [IU]
  Filled 2011-08-08: qty 5

## 2011-08-08 MED ORDER — ONDANSETRON 16 MG/50ML IVPB (CHCC)
16.0000 mg | Freq: Once | INTRAVENOUS | Status: AC
  Administered 2011-08-08: 16 mg via INTRAVENOUS

## 2011-08-08 MED ORDER — DEXTROSE 5 % IV SOLN: Freq: Once | INTRAVENOUS | Status: DC

## 2011-08-08 MED ORDER — SODIUM CHLORIDE 0.9 % IV SOLN
300.0000 mg | Freq: Once | INTRAVENOUS | Status: AC
  Administered 2011-08-08: 300 mg via INTRAVENOUS
  Filled 2011-08-08: qty 30

## 2011-08-08 MED ORDER — SODIUM CHLORIDE 0.9 % IJ SOLN
10.0000 mL | INTRAMUSCULAR | Status: DC | PRN
Start: 2011-08-08 — End: 2011-08-08
  Administered 2011-08-08: 10 mL
  Filled 2011-08-08: qty 10

## 2011-08-08 MED ORDER — DEXTROSE 5 % IV SOLN
30.0000 mg/m2 | Freq: Once | INTRAVENOUS | Status: AC
  Administered 2011-08-08: 54 mg via INTRAVENOUS
  Filled 2011-08-08: qty 27

## 2011-08-08 MED ORDER — SODIUM CHLORIDE 0.9 % IJ SOLN
100.0000 ug | Freq: Once | INTRAVENOUS | Status: AC
  Administered 2011-08-08: 0.1 mg via INTRADERMAL
  Filled 2011-08-08: qty 0.01

## 2011-08-08 MED ORDER — ALTEPLASE 2 MG IJ SOLR
2.0000 mg | Freq: Once | INTRAMUSCULAR | Status: AC | PRN
  Administered 2011-08-08: 2 mg
  Filled 2011-08-08: qty 2

## 2011-08-08 NOTE — Patient Instructions (Addendum)
[facility] Discharge Instructions for Patients Receiving Chemotherapy  Today you received the following chemotherapy agents Doxil/Carboplatin To help prevent nausea and vomiting after your treatment, we encourage you to take your nausea medication as prescribed. If you develop nausea and vomiting that is not controlled by your nausea medication, call the clinic. If it is after clinic hours your family physician or the after hours number for the clinic or go to the Emergency Department.   BELOW ARE SYMPTOMS THAT SHOULD BE REPORTED IMMEDIATELY:  *FEVER GREATER THAN 100.5 F  *CHILLS WITH OR WITHOUT FEVER  NAUSEA AND VOMITING THAT IS NOT CONTROLLED WITH YOUR NAUSEA MEDICATION  *UNUSUAL SHORTNESS OF BREATH  *UNUSUAL BRUISING OR BLEEDING  TENDERNESS IN MOUTH AND THROAT WITH OR WITHOUT PRESENCE OF ULCERS  *URINARY PROBLEMS  *BOWEL PROBLEMS  UNUSUAL RASH Items with * indicate a potential emergency and should be followed up as soon as possible.  One of the nurses will contact you 24 hours after your treatment. Please let the nurse know about any problems that you may have experienced. Feel free to call the clinic you have any questions or concerns. The clinic phone number is [phone].   I have been informed and understand all the instructions given to me. I know to contact the clinic, my physician, or go to the Emergency Department if any problems should occur. I do not have any questions at this time, but understand that I may call the clinic during office hours   should I have any questions or need assistance in obtaining follow up care.    __________________________________________  _____________  __________ Signature of Patient or Authorized Representative            Date                   Time    __________________________________________ Nurse's Signature

## 2011-08-08 NOTE — Telephone Encounter (Signed)
{

## 2011-08-08 NOTE — Progress Notes (Signed)
1240-OK to treat today with WBC- 3.0 and ANC-1.4 per Dr. Darnelle Catalan.

## 2011-08-08 NOTE — Progress Notes (Signed)
ID: Regina West   DOB: Jul 05, 1929  MR#: [id number]  VHQ#:[id number]  HISTORY OF PRESENT ILLNESS: The patient originally presented in the summer of 2010 with cramps and abdominal distension.  I do not have a copy of the initial evaluation, but on November 26, 2008, the patient underwent optimal debulking with bilateral salpingo-oophorectomy, omentectomy, and the placement of an intraperitoneal port.  The pathology from that procedure (Accession Number [id number] at [facility]) showed first of all, significant involvement of the omentum, minimal involvement of the right ovary and right fallopian tube and negative on the left ovary and fallopian tube, with neither ovary being enlarged, consistent with a primary peritoneal serous adenocarcinoma, described as moderately differentiated.  The sample included no lymph nodes.    The patient had an intraperitoneal port placed in the same surgery and was treated with intraperitoneal and IV chemotherapy according to GOG-252 but I am not sure which arm she was in.  (Arm 2 did carboplatin intraperitoneally and Taxol IV.  Arm 3 did cisplatin and paclitaxel intraperitoneally with paclitaxel IV.)  All arms received bevacizumab.  Unfortunately, after 4 cycles of treatment, she had acute mental status changes and was admitted here January of 2011 with what proved to be posterior reversible leukoencephalopathy and SIADH.  She had seizures, aphasia, and required intubation.  Once the patient recovered from this, she was treated with 2 cycles of single-agent carboplatin (I do not have the AUC). Her last adjuvant treatment was May 12, 2009, and her CA-125 at that time was 10.3.  Her intraperitoneal port was removed in April of 2011.    More recently, the patient was in routine follow up when her CA-125 was found to jump up to 2,269.7 (March 26, 2010).  This is 10 months after her last prior chemotherapy.  She had CTs of the chest, abdomen and pelvis January 26th which showed  ascites and enhancing peritoneal nodularity. She had had similar findings at presentation but these had completely resolved by the time she finished treatment in March of 2011. The patient was felt to be in first relapse. Subsequent treatments are as summarized below   INTERVAL HISTORY: The patient returns today with her daughter-in-law [name] and her friend [name] for followup of Alecia's ovarian cancer. Since her last visit here she went back to [facility] and to visit with Dr. Nedra Hai. Dr. Nedra Hai according to the patient started her on a suppositories and stool softeners. Yaritzy however I went to the beach with her family and lost Dr. Marigene Ehlers instructions. I have not yet received a copy of Dr. Marigene Ehlers notes.  REVIEW OF SYSTEMS: Birgitta has developed irritation around both eyes. She saw Dr. Burgess Estelle and Dr. Dagoberto Ligas regarding this. The general feeling is that most likely this is due to allergies. She is taking Claritin in the morning and Benadryl in the evening. This is helping a little but her eyes are still itchy. There are no visual changes, no headaches, and no photophobia or scotoma. She continues to feel mildly fatigued, has occasional hemorrhoidal bleeding, and can't quite tell me exactly how much fluid she drinks during the day but she feels it to "not enough". There have been no intercurrent fevers, falls, cough, phlegm production, pleurisy, chest pain or pressure, or other rash or bleeding. A detailed review of systems today was stable.  PAST MEDICAL HISTORY: Significant for hysterectomy at the age of 41 with concurrent bladder "tuck up."  She also underwent appendectomy during that procedure.  She is status post prior bilateral  cataract surgery. She is status post tonsillectomy and adenoidectomy.  She has a history of hypertension and diabetes. She has a history of diverticulosis.  She has a large hiatal hernia.  She has degenerative disk disease.  She has been noted to have coronary calcifications and she has  hyperlipidemia.  There is a history of remote tobacco abuse. Seizure disorder as per HPI  FAMILY HISTORY The patient's father died at the age of 73 after heart surgery for aortic stenosis.  The patient's mother died at the age of 20.  The patient has one brother in good health.  There is no other family history to her knowledge of ovarian or breast cancer.    GYNECOLOGIC HISTORY: Menarche at age 50.  She is GX P3 with menopause in her late 54s.  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  Her husband was in sales and had a history of Parkinson's disease.  He died following a fall in the year 2000.  The patient's son [name] lives in [place], and has a history of MS.  Daughter [name] lives in [place]. She is a Futures trader.  Son [name] lives in [place], [place] and is in Airline pilot. The patient has 8 grandchildren. She attends the [employer].  She is a good friend of our patients [name]   ADVANCED DIRECTIVES: in place  HEALTH MAINTENANCE: History  Substance Use Topics  . Smoking status: Former Smoker    Quit date: 05/24/1959  . Smokeless tobacco: Never Used  . Alcohol Use: No     Colonoscopy: 2008  PAP: UTD  MM: refuses  Allergies  Allergen Reactions  . Avastin (Bevacizumab) Other (See Comments)    Swelling of the brain     Current Outpatient Prescriptions  Medication Sig Dispense Refill  . acyclovir (ZOVIRAX) 400 MG tablet Take 400 mg by mouth 2 (two) times daily.      Marland Kitchen antiseptic oral rinse (BIOTENE) LIQD 15 mLs by Mouth Rinse route 2 (two) times daily.  1 Bottle  0  . cholecalciferol (VITAMIN D) 1000 UNITS tablet Take 1,000 Units by mouth 2 (two) times daily.      Marland Kitchen escitalopram (LEXAPRO) 10 MG tablet       . levETIRAcetam (KEPPRA) 500 MG tablet Take 250 mg by mouth every 12 (twelve) hours. Pt takes 1/2 tab for 250 mg dose      .  lidocaine-prilocaine (EMLA) cream Apply 1 application topically as needed.      . metoCLOPramide (REGLAN) 5 MG tablet Take 1 tablet (5 mg total) by mouth 3 (three) times daily before meals.  90 tablet  2  . omeprazole (PRILOSEC) 40 MG capsule Take 1 capsule (40 mg total) by mouth at bedtime.  30 capsule  3  . Epinastine HCl 0.05 % ophthalmic solution       . LOTEMAX 0.5 % OINT       . mirtazapine (REMERON SOL-TAB) 15 MG disintegrating tablet Take 1 tablet (15 mg total) by mouth at bedtime.  30 tablet  3  . neomycin-polymyxin b-dexamethasone (MAXITROL) 3.5-10000-0.1 OINT       . ondansetron (ZOFRAN-ODT) 8 MG disintegrating tablet Take 1 tablet (8 mg total) by mouth every 8 (eight) hours as needed. nausea  20 tablet  3  . PRESCRIPTION MEDICATION Pt gets chemo at [facility]. Last treatment was on 05-16-11 followed by  Dr Bevelyn Buckles      . prochlorperazine (COMPAZINE) 10 MG tablet Take 1 tablet (10 mg total) by mouth every 6 (six) hours as needed.  30 tablet  0  . prochlorperazine (COMPAZINE) 10 MG tablet         OBJECTIVE: Elderly white woman in no acute distress Filed Vitals:   08/08/11 1154  BP: 115/73  Pulse: 78  Temp: 98.6 F (37 C)     Body mass index is 22.84 kg/(m^2).    ECOG FS: 2  Sclerae unicteric; the periorbital tissues are irritated and slightly hyperpigmented, not quite erythematous; there is no skin breakdown or suppuration Oropharynx clear No peripheral adenopathy Lungs no rales or rhonchi; fair excursion bilaterally Heart regular rate and rhythm Abd mildly distended, not terse, nontender, no organomegaly, no masses palpated.  MSK no focal spinal tenderness, no peripheral edema Neuro: nonfocal  LAB RESULTS: Results for KIP, KAUTZMAN (MRN [id number]) as of 08/08/2011 11:42  Ref. Range 05/02/2011 14:14 05/23/2011 14:23 06/08/2011 03:50 06/22/2011 13:35 07/11/2011 13:52  CA 125 Latest Range: 0.0-30.2 U/mL 360.8 (H) 426.8 (H) 144.4 (H) 112.4 (H) 42.7 (H)     Lab Results  Component  Value Date   CA125 42.7* 07/11/2011   Lab Results  Component Value Date   WBC 3.0* 08/08/2011   NEUTROABS 1.4* 08/08/2011   HGB 9.8* 08/08/2011   HCT 28.6* 08/08/2011   MCV 99.7 08/08/2011   PLT 154 08/08/2011      Chemistry      Component Value Date/Time   NA 135 07/11/2011 1309   NA 143 05/12/2011 1337   K 4.2 07/11/2011 1309   K 4.1 05/12/2011 1337   CL 98 07/11/2011 1309   CL 92* 05/12/2011 1337   CO2 26 07/11/2011 1309   CO2 32 05/12/2011 1337   BUN 18 07/11/2011 1309   BUN 19 05/12/2011 1337   CREATININE 1.07 07/11/2011 1309   CREATININE 1.3* 05/12/2011 1337      Component Value Date/Time   CALCIUM 9.8 07/11/2011 1309   CALCIUM 9.8 05/12/2011 1337   ALKPHOS 92 07/11/2011 1309   ALKPHOS 95* 05/12/2011 1337   AST 20 07/11/2011 1309   AST 40* 05/12/2011 1337   ALT 14 07/11/2011 1309   BILITOT 0.2* 07/11/2011 1309   BILITOT 0.60 05/12/2011 1337      STUDIES: CT ABDOMEN AND PELVIS WITH CONTRAST  Technique: Multidetector CT imaging of the abdomen and pelvis was  performed following the standard protocol during bolus  administration of intravenous contrast.  Contrast: OMNIPAQUE IOHEXOL 300 MG/ML SOLN  Comparison: 05/23/2011  Findings: Pleural effusions seen previously have resolved. Large  hiatal hernia again noted. 1.6 x 0.7 cm low density lesion along  the lateral surface of the liver is better seen than on the  previous study, but decreased when comparing back to 05/02/2011.  Liver is otherwise unremarkable. Spleen is stable. The distal  stomach, duodenum, pancreas, gallbladder, and adrenal glands are  without acute findings. Kidneys are unremarkable.  No abdominal aortic aneurysm. There is no free fluid or  lymphadenopathy in the abdomen.  Imaging through the pelvis shows no free intraperitoneal fluid. No  pelvic sidewall lymphadenopathy. Bladder is not distended and  there is circumferential irregular bladder wall thickening. A  small 2.0 x 3.8 cm collection of rim enhancing fluid is seen in the    right pelvis, just cranial to the bladder (see image 60 of series  2).  Uterus is surgically absent. No adnexal mass. Advanced  diverticular changes are seen in the sigmoid colon without  diverticulitis. The colon is diffusely stool-filled raising the  question of clinical constipation. There is some enhancing or  mineralized irregular soft tissue tracking down the right  peritoneum, just anterior to the right colon. Terminal ileum is  unremarkable. The appendix is not visualized, but there is no edema  or inflammation in the region of the cecum.  Bone windows reveal no worrisome lytic or sclerotic osseous  lesions.  IMPRESSION:  Probable metastatic disease involving the surface of the liver is  better seen than on the previous study, but it is clearly decreased  when comparing back to 05/02/2011.  Small collection of rim enhancing fluid in the right pelvis is  probably also related to metastatic disease but is smaller than on  05/02/2011.  No evidence for new or progressive metastatic disease.  Stable large hiatal hernia. No evidence for bowel obstruction on  today's study.  Prominent stool volume: Raises a question of clinical  constipation.  Original Report Authenticated By: ERIC A. MANSELL, M.D.   ASSESSMENT: 78 year old [place] woman   (1) status post optimal debulking of a primary peritoneal serous adenocarcinoma September 2010, the tumor being moderately differentiated, pT3c NX (stage IIIC), treated according to GOG 252 with  intraperitoneal platinum and paclitaxel x4 given along with bevacizumab, complicated by SIADHafter cycle 4, also with posterior reversible leukoencephalopathy.  After these problems resolved she received 2 additional cycles of single-agent carboplatin completed in March 2011  (2)  She had her first recurrence February 2012  treated with carboplatin and Gemzar for a total of 6 cycles between February and June 2012.  Her CEA-125 had normalized before the  beginning of cycle 5.   (3) second recurrence documented February 2013, being treated with carboplatin/doxil  PLAN: Today is day 1 of cycle 4 of her carboplatin/Doxil. Overall she is tolerating treatment well. I think he would be useful if we saw her on a Q2 week basis, with lab work and exam. My suspicion is that she will need an additional 4 cycles beyond today's if we're going to get her to something resembling a complete remission. She benefits from intravenous fluids on days 2 and 3 and that is being operationalized.  I am hopeful that in addition to the Claritin and Benadryl, the steroids we use for chemotherapy nausea prevention I will be helpful as far as her "raccoon eyes" are concerned. We discussed postponing her next chemotherapy until after the July 4 holiday but actually thinks would work much better for her if we do it before, so her next dose will be July 1. She knows to call for any problems that may develop before the next visit.   Cederic Mozley C    08/08/2011

## 2011-08-08 NOTE — Telephone Encounter (Signed)
gve the pt her June-aug 2013 appt calendar. They are aware that the tx appts will be added. Sent michelle a staff message

## 2011-08-08 NOTE — Progress Notes (Signed)
TPA administered 08/08/2011 @ 1305 Postitive blood return 08/08/2011 @ 1335 Ok to treat with ANC 1.4 per Dr. Darnelle Catalan. Ok to treat with serum creatinine 1.03mg /dl per Dr. Darnelle Catalan.  Carbo skin test administered 1521. Carbo skin test checked at 5 min, 15 min and 30 min, no redness, or induration noted at the site. Patient denies itching or other signs of reaction at skin test site.

## 2011-08-09 ENCOUNTER — Ambulatory Visit (HOSPITAL_BASED_OUTPATIENT_CLINIC_OR_DEPARTMENT_OTHER): Payer: Medicare Other

## 2011-08-09 VITALS — BP 114/65 | HR 76 | Temp 97.8°F

## 2011-08-09 DIAGNOSIS — C569 Malignant neoplasm of unspecified ovary: Secondary | ICD-10-CM

## 2011-08-09 MED ORDER — SODIUM CHLORIDE 0.45 % IV SOLN
INTRAVENOUS | Status: DC
  Administered 2011-08-09: 09:00:00 via INTRAVENOUS
  Filled 2011-08-09: qty 1000

## 2011-08-10 ENCOUNTER — Ambulatory Visit (HOSPITAL_BASED_OUTPATIENT_CLINIC_OR_DEPARTMENT_OTHER): Payer: Medicare Other

## 2011-08-10 VITALS — BP 107/59 | HR 64 | Temp 97.5°F

## 2011-08-10 DIAGNOSIS — C569 Malignant neoplasm of unspecified ovary: Secondary | ICD-10-CM

## 2011-08-10 DIAGNOSIS — E860 Dehydration: Secondary | ICD-10-CM

## 2011-08-10 MED ORDER — HEPARIN SOD (PORK) LOCK FLUSH 100 UNIT/ML IV SOLN
500.0000 [IU] | Freq: Once | INTRAVENOUS | Status: AC
  Administered 2011-08-10: 500 [IU] via INTRAVENOUS
  Filled 2011-08-10: qty 5

## 2011-08-10 MED ORDER — SODIUM CHLORIDE 0.9 % IV SOLN
INTRAVENOUS | Status: AC
  Administered 2011-08-10: 09:00:00 via INTRAVENOUS

## 2011-08-10 MED ORDER — SODIUM CHLORIDE 0.9 % IJ SOLN
10.0000 mL | INTRAMUSCULAR | Status: DC | PRN
  Administered 2011-08-10: 10 mL via INTRAVENOUS
  Filled 2011-08-10: qty 10

## 2011-08-10 NOTE — Patient Instructions (Signed)
Dehydration, Adult Dehydration is when you lose more fluids from the body than you take in. Vital organs like the kidneys, brain, and heart cannot function without a proper amount of fluids and salt. Any loss of fluids from the body can cause dehydration.  CAUSES   Vomiting.   Diarrhea.   Excessive sweating.   Excessive urine output.   Fever.  SYMPTOMS  Mild dehydration  Thirst.   Dry lips.   Slightly dry mouth.  Moderate dehydration  Very dry mouth.   Sunken eyes.   Skin does not bounce back quickly when lightly pinched and released.   Dark urine and decreased urine production.   Decreased tear production.   Headache.  Severe dehydration  Very dry mouth.   Extreme thirst.   Rapid, weak pulse (more than 100 beats per minute at rest).   Cold hands and feet.   Not able to sweat in spite of heat and temperature.   Rapid breathing.   Blue lips.   Confusion and lethargy.   Difficulty being awakened.   Minimal urine production.   No tears.  DIAGNOSIS  Your caregiver will diagnose dehydration based on your symptoms and your exam. Blood and urine tests will help confirm the diagnosis. The diagnostic evaluation should also identify the cause of dehydration. TREATMENT  Treatment of mild or moderate dehydration can often be done at home by increasing the amount of fluids that you drink. It is best to drink small amounts of fluid more often. Drinking too much at one time can make vomiting worse. Refer to the home care instructions below. Severe dehydration needs to be treated at the hospital where you will probably be given intravenous (IV) fluids that contain water and electrolytes. HOME CARE INSTRUCTIONS   Ask your caregiver about specific rehydration instructions.   Drink enough fluids to keep your urine clear or pale yellow.   Drink small amounts frequently if you have nausea and vomiting.   Eat as you normally do.   Avoid:   Foods or drinks high in  sugar.   Carbonated drinks.   Juice.   Extremely hot or cold fluids.   Drinks with caffeine.   Fatty, greasy foods.   Alcohol.   Tobacco.   Overeating.   Gelatin desserts.   Wash your hands well to avoid spreading bacteria and viruses.   Only take over-the-counter or prescription medicines for pain, discomfort, or fever as directed by your caregiver.   Ask your caregiver if you should continue all prescribed and over-the-counter medicines.   Keep all follow-up appointments with your caregiver.  SEEK MEDICAL CARE IF:  You have abdominal pain and it increases or stays in one area (localizes).   You have a rash, stiff neck, or severe headache.   You are irritable, sleepy, or difficult to awaken.   You are weak, dizzy, or extremely thirsty.  SEEK IMMEDIATE MEDICAL CARE IF:   You are unable to keep fluids down or you get worse despite treatment.   You have frequent episodes of vomiting or diarrhea.   You have blood or green matter (bile) in your vomit.   You have blood in your stool or your stool looks black and tarry.   You have not urinated in 6 to 8 hours, or you have only urinated a small amount of very dark urine.   You have a fever.   You faint.  MAKE SURE YOU:   Understand these instructions.   Will watch your condition.     Will get help right away if you are not doing well or get worse.  Document Released: 02/21/2005 Document Revised: 02/10/2011 Document Reviewed: 10/11/2010 Exit[employer] Patient Information 2012 [employer]. 

## 2011-08-15 ENCOUNTER — Other Ambulatory Visit: Payer: Self-pay | Admitting: Certified Registered Nurse Anesthetist

## 2011-08-22 ENCOUNTER — Other Ambulatory Visit (HOSPITAL_BASED_OUTPATIENT_CLINIC_OR_DEPARTMENT_OTHER): Payer: Medicare Other | Admitting: Lab

## 2011-08-22 ENCOUNTER — Other Ambulatory Visit: Payer: Self-pay | Admitting: *Deleted

## 2011-08-22 ENCOUNTER — Ambulatory Visit (HOSPITAL_BASED_OUTPATIENT_CLINIC_OR_DEPARTMENT_OTHER): Payer: Medicare Other | Admitting: Physician Assistant

## 2011-08-22 ENCOUNTER — Encounter: Payer: Self-pay | Admitting: Physician Assistant

## 2011-08-22 ENCOUNTER — Ambulatory Visit (HOSPITAL_BASED_OUTPATIENT_CLINIC_OR_DEPARTMENT_OTHER): Payer: Medicare Other | Admitting: Lab

## 2011-08-22 VITALS — BP 111/70 | HR 86 | Temp 98.2°F | Ht 66.0 in | Wt 141.4 lb

## 2011-08-22 DIAGNOSIS — C482 Malignant neoplasm of peritoneum, unspecified: Secondary | ICD-10-CM

## 2011-08-22 DIAGNOSIS — R300 Dysuria: Secondary | ICD-10-CM

## 2011-08-22 DIAGNOSIS — C569 Malignant neoplasm of unspecified ovary: Secondary | ICD-10-CM

## 2011-08-22 DIAGNOSIS — N390 Urinary tract infection, site not specified: Secondary | ICD-10-CM

## 2011-08-22 DIAGNOSIS — Z5111 Encounter for antineoplastic chemotherapy: Secondary | ICD-10-CM

## 2011-08-22 LAB — CBC WITH DIFFERENTIAL/PLATELET
BASO%: 0 % (ref 0.0–2.0)
EOS%: 0.7 % (ref 0.0–7.0)
LYMPH%: 25 % (ref 14.0–49.7)
MCHC: 34.5 g/dL (ref 31.5–36.0)
MCV: 101.1 fL — ABNORMAL HIGH (ref 79.5–101.0)
MONO%: 9 % (ref 0.0–14.0)
NEUT#: 2 10*3/uL (ref 1.5–6.5)
Platelets: 67 10*3/uL — ABNORMAL LOW (ref 145–400)
RBC: 2.64 10*6/uL — ABNORMAL LOW (ref 3.70–5.45)
RDW: 18.3 % — ABNORMAL HIGH (ref 11.2–14.5)
nRBC: 0 % (ref 0–0)

## 2011-08-22 LAB — URINALYSIS, MICROSCOPIC - CHCC
Bilirubin (Urine): NEGATIVE
Glucose: NEGATIVE g/dL
Ketones: NEGATIVE mg/dL
pH: 6 (ref 4.6–8.0)

## 2011-08-22 LAB — COMPREHENSIVE METABOLIC PANEL
ALT: 14 U/L (ref 0–35)
BUN: 20 mg/dL (ref 6–23)
CO2: 27 mEq/L (ref 19–32)
Calcium: 9.7 mg/dL (ref 8.4–10.5)
Creatinine, Ser: 1.08 mg/dL (ref 0.50–1.10)
Glucose, Bld: 177 mg/dL — ABNORMAL HIGH (ref 70–99)
Potassium: 4.4 mEq/L (ref 3.5–5.3)
Sodium: 139 mEq/L (ref 135–145)
Total Bilirubin: 0.5 mg/dL (ref 0.3–1.2)

## 2011-08-22 LAB — CA 125: CA 125: 20.6 U/mL (ref 0.0–30.2)

## 2011-08-22 LAB — MAGNESIUM: Magnesium: 1.5 mg/dL (ref 1.5–2.5)

## 2011-08-22 MED ORDER — NITROFURANTOIN MONOHYD MACRO 100 MG PO CAPS: 100.0000 mg | ORAL_CAPSULE | Freq: Two times a day (BID) | ORAL | Status: AC

## 2011-08-22 NOTE — Telephone Encounter (Signed)
Sent patient back to the lab on 08-22-2011 cancelled lab for 09-19-2011 added on lab and ab on 09-21-2011 added on lab and md for 10-03-2011 added on fluids 10-04-2011 printed out calendar and gave to the patient

## 2011-08-22 NOTE — Progress Notes (Signed)
ID: Regina West   DOB: [birth year]  MR#: [id number]  CSN#:[id number]  HISTORY OF PRESENT ILLNESS: The patient originally presented in the summer of 2010 with cramps and abdominal distension.  I do not have a copy of the initial evaluation, but on November 26, 2008, the patient underwent optimal debulking with bilateral salpingo-oophorectomy, omentectomy, and the placement of an intraperitoneal port.  The pathology from that procedure (Accession Number [id number] at [facility]) showed first of all, significant involvement of the omentum, minimal involvement of the right ovary and right fallopian tube and negative on the left ovary and fallopian tube, with neither ovary being enlarged, consistent with a primary peritoneal serous adenocarcinoma, described as moderately differentiated.  The sample included no lymph nodes.    The patient had an intraperitoneal port placed in the same surgery and was treated with intraperitoneal and IV chemotherapy according to GOG-252 but I am not sure which arm she was in.  (Arm 2 did carboplatin intraperitoneally and Taxol IV.  Arm 3 did cisplatin and paclitaxel intraperitoneally with paclitaxel IV.)  All arms received bevacizumab.  Unfortunately, after 4 cycles of treatment, she had acute mental status changes and was admitted here January of 2011 with what proved to be posterior reversible leukoencephalopathy and SIADH.  She had seizures, aphasia, and required intubation.  Once the patient recovered from this, she was treated with 2 cycles of single-agent carboplatin (I do not have the AUC). Her last adjuvant treatment was May 12, 2009, and her CA-125 at that time was 10.3.  Her intraperitoneal port was removed in April of 2011.    More recently, the patient was in routine follow up when her CA-125 was found to jump up to 2,269.7 (March 26, 2010).  This is 10 months after her last prior chemotherapy.  She had CTs of the chest, abdomen and pelvis January 26th which showed  ascites and enhancing peritoneal nodularity. She had had similar findings at presentation but these had completely resolved by the time she finished treatment in March of 2011. The patient was felt to be in first relapse. Subsequent treatments are as summarized below   INTERVAL HISTORY:  Regina West returns today with her daughter-in-law Regina West for followup of Regina West's ovarian cancer. Interval history is notable for Regina West "finally feeling almost normal". They have a family celebration for father's day yesterday and she was actually able to help with the dishes. Her energy level has improved.   REVIEW OF SYSTEMS:  Regina West continues to have some irritation around both eyes, although this has improved significantly, and did in fact improve after taking steroids 2 weeks ago. She continues to take Claritin in the morning as well. She was taking Benadryl at night, but found that she felt dizzy in the morning so this was discontinued. She is sleeping reasonably well. She's had no fevers or chills. No nausea or emesis. She drinks prune juice for mild constipation and continues to have regular bowel movements. She has mild dysuria and notes that her urine is a little cloudy. No hematuria noted. No cough or increased shortness of breath. No chest pain. No abdominal pain or pain elsewhere. No abnormal headaches.  A detailed review of systems is otherwise noncontributory.   PAST MEDICAL HISTORY: Significant for hysterectomy at the age of 28 with concurrent bladder "tuck up."  She also underwent appendectomy during that procedure.  She is status post prior bilateral cataract surgery. She is status post tonsillectomy and adenoidectomy.  She has a history of hypertension and diabetes.  She has a history of diverticulosis.  She has a large hiatal hernia.  She has degenerative disk disease.  She has been noted to have coronary calcifications and she has hyperlipidemia.  There is a history of remote tobacco abuse. Seizure  disorder as per HPI  FAMILY HISTORY The patient's father died at the age of 105 after heart surgery for aortic stenosis.  The patient's mother died at the age of 10.  The patient has one brother in good health.  There is no other family history to her knowledge of ovarian or breast cancer.    GYNECOLOGIC HISTORY: Menarche at age 62.  She is GX P3 with menopause in her late 92s.  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  Her husband was in sales and had a history of Parkinson's disease.  He died following a fall in the year 2000.  The patient's son Regina West lives in [place], and has a history of MS.  Daughter Regina West lives in [place]. She is a Futures trader.  Son Regina West lives in [place], [place] and is in Airline pilot. The patient has 8 grandchildren. She attends the [employer].  She is a good friend of our patients [name]   ADVANCED DIRECTIVES: in place  HEALTH MAINTENANCE: History  Substance Use Topics  . Smoking status: Former Smoker    Quit date: 05/24/1959  . Smokeless tobacco: Never Used  . Alcohol Use: No     Colonoscopy: 2008  PAP: UTD  MM: refuses  Allergies  Allergen Reactions  . Avastin (Bevacizumab) Other (See Comments)    Swelling of the brain     Current Outpatient Prescriptions  Medication Sig Dispense Refill  . acyclovir (ZOVIRAX) 400 MG tablet Take 400 mg by mouth 2 (two) times daily.      Marland Kitchen antiseptic oral rinse (BIOTENE) LIQD 15 mLs by Mouth Rinse route 2 (two) times daily.  1 Bottle  0  . cholecalciferol (VITAMIN D) 1000 UNITS tablet Take 1,000 Units by mouth 2 (two) times daily.      Marland Kitchen Epinastine HCl 0.05 % ophthalmic solution       . escitalopram (LEXAPRO) 10 MG tablet       . levETIRAcetam (KEPPRA) 500 MG tablet Take 250 mg by mouth every 12 (twelve) hours. Pt takes 1/2 tab for 250 mg dose      . lidocaine-prilocaine (EMLA)  cream Apply 1 application topically as needed.      Marland Kitchen LOTEMAX 0.5 % OINT       . metoCLOPramide (REGLAN) 5 MG tablet Take 1 tablet (5 mg total) by mouth 3 (three) times daily before meals.  90 tablet  2  . mirtazapine (REMERON SOL-TAB) 15 MG disintegrating tablet Take 1 tablet (15 mg total) by mouth at bedtime.  30 tablet  3  . neomycin-polymyxin b-dexamethasone (MAXITROL) 3.5-10000-0.1 OINT       . omeprazole (PRILOSEC) 40 MG capsule Take 1 capsule (40 mg total) by mouth at bedtime.  30 capsule  3  . ondansetron (ZOFRAN-ODT) 8 MG disintegrating tablet Take 1 tablet (8 mg total) by mouth every 8 (eight) hours as needed. nausea  20 tablet  3  . PRESCRIPTION MEDICATION Pt gets chemo at [facility]. Last treatment was on 05-16-11 followed by Dr Bevelyn Buckles      . prochlorperazine (COMPAZINE) 10 MG tablet Take 1 tablet (10 mg  total) by mouth every 6 (six) hours as needed.  30 tablet  0  . prochlorperazine (COMPAZINE) 10 MG tablet         OBJECTIVE: Elderly white woman in no acute distress Filed Vitals:   08/22/11 1135  BP: 111/70  Pulse: 86  Temp: 98.2 F (36.8 C)     Body mass index is 22.82 kg/(m^2).    ECOG FS: 2 Filed Weights   08/22/11 1135  Weight: 141 lb 6.4 oz (64.139 kg)   Physical Exam: HEENT:  Sclerae anicteric, . Orbital tissue continues to be slightly irritated and hyperpigmented, although this is improving.  Oropharynx clear.  No mucositis or candidiasis.   Nodes:  No cervical, supraclavicular, or axillary lymphadenopathy palpated.  Breast Exam:  Deferred  Lungs:  Clear to auscultation bilaterally.  No crackles, rhonchi, or wheezes.   Heart:  Regular rate and rhythm.   Abdomen:  Soft, mildly distended, nontender.  Positive bowel sounds.  No organomegaly or masses palpated.   Musculoskeletal:  No focal spinal tenderness to palpation.  Extremities:  Benign.  No peripheral edema or cyanosis.   Skin:  Benign.   Neuro:  Nonfocal. Alert and oriented x3.    LAB RESULTS: Results for  CHERIKA, JESSIE (MRN [id number]) as of 08/08/2011 11:42  Ref. Range 05/02/2011 14:14 05/23/2011 14:23 06/08/2011 03:50 06/22/2011 13:35 07/11/2011 13:52  CA 125 Latest Range: 0.0-30.2 U/mL 360.8 (H) 426.8 (H) 144.4 (H) 112.4 (H) 42.7 (H)     Lab Results  Component Value Date   CA125 25.2 08/08/2011   Lab Results  Component Value Date   WBC 3.0* 08/22/2011   NEUTROABS 2.0 08/22/2011   HGB 9.2* 08/22/2011   HCT 26.7* 08/22/2011   MCV 101.1* 08/22/2011   PLT 67* 08/22/2011      Chemistry      Component Value Date/Time   NA 136 08/08/2011 1139   NA 143 05/12/2011 1337   K 4.2 08/08/2011 1139   K 4.1 05/12/2011 1337   CL 100 08/08/2011 1139   CL 92* 05/12/2011 1337   CO2 28 08/08/2011 1139   CO2 32 05/12/2011 1337   BUN 21 08/08/2011 1139   BUN 19 05/12/2011 1337   CREATININE 1.03 08/08/2011 1139   CREATININE 1.3* 05/12/2011 1337      Component Value Date/Time   CALCIUM 10.0 08/08/2011 1139   CALCIUM 9.8 05/12/2011 1337   ALKPHOS 87 08/08/2011 1139   ALKPHOS 95* 05/12/2011 1337   AST 21 08/08/2011 1139   AST 40* 05/12/2011 1337   ALT 11 08/08/2011 1139   BILITOT 0.3 08/08/2011 1139   BILITOT 0.60 05/12/2011 1337      STUDIES:  No recent studies.   ASSESSMENT: 78 year old [place] woman   (1) status post optimal debulking of a primary peritoneal serous adenocarcinoma September 2010, the tumor being moderately differentiated, pT3c NX (stage IIIC), treated according to GOG 252 with  intraperitoneal platinum and paclitaxel x4 given along with bevacizumab, complicated by SIADHafter cycle 4, also with posterior reversible leukoencephalopathy.  After these problems resolved she received 2 additional cycles of single-agent carboplatin completed in March 2011  (2)  She had her first recurrence February 2012  treated with carboplatin and Gemzar for a total of 6 cycles between February and June 2012.  Her CEA-125 had normalized before the beginning of cycle 5.   (3) second recurrence documented February 2013, being treated with  carboplatin/doxil  PLAN:  Myliyah continues to tolerate this regimen well, and was very pleased  to see the normalization of her CEA 125 as of labs on 08/08/2011. We will continue to see her on a every 2 week basis, and we'll follow her labs closely. Per Dr. Darrall Dears previous note, she will likely need an additional 4 cycles.  She scheduled to return here on July 1 for labs, followup visit, and her fifth cycle of carboplatin/Doxil. Again we will give her IV fluids on days 2 and 3 which seems to be very helpful for her.  We will check a urinalysis and urine culture today to evaluate for possible urinary tract infection.  Syvilla and her daughter both voice understanding and agreement with our plan, she will call with any changes or problems.  Ramiel Forti    08/22/2011

## 2011-08-24 LAB — URINE CULTURE

## 2011-09-05 ENCOUNTER — Ambulatory Visit: Payer: Medicare Other | Admitting: Physician Assistant

## 2011-09-05 ENCOUNTER — Ambulatory Visit (HOSPITAL_BASED_OUTPATIENT_CLINIC_OR_DEPARTMENT_OTHER): Payer: Medicare Other

## 2011-09-05 ENCOUNTER — Encounter: Payer: Self-pay | Admitting: Physician Assistant

## 2011-09-05 ENCOUNTER — Other Ambulatory Visit: Payer: Medicare Other | Admitting: Lab

## 2011-09-05 ENCOUNTER — Ambulatory Visit (HOSPITAL_BASED_OUTPATIENT_CLINIC_OR_DEPARTMENT_OTHER): Payer: Medicare Other | Admitting: Physician Assistant

## 2011-09-05 ENCOUNTER — Telehealth: Payer: Self-pay | Admitting: Oncology

## 2011-09-05 VITALS — BP 128/73 | HR 72 | Temp 97.7°F | Ht 66.0 in | Wt 146.0 lb

## 2011-09-05 DIAGNOSIS — N390 Urinary tract infection, site not specified: Secondary | ICD-10-CM

## 2011-09-05 DIAGNOSIS — C482 Malignant neoplasm of peritoneum, unspecified: Secondary | ICD-10-CM

## 2011-09-05 DIAGNOSIS — C569 Malignant neoplasm of unspecified ovary: Secondary | ICD-10-CM

## 2011-09-05 DIAGNOSIS — Z5111 Encounter for antineoplastic chemotherapy: Secondary | ICD-10-CM

## 2011-09-05 LAB — CBC WITH DIFFERENTIAL/PLATELET
BASO%: 0.4 % (ref 0.0–2.0)
EOS%: 1.4 % (ref 0.0–7.0)
HCT: 26.9 % — ABNORMAL LOW (ref 34.8–46.6)
LYMPH%: 23.9 % (ref 14.0–49.7)
MCH: 36 pg — ABNORMAL HIGH (ref 25.1–34.0)
MCHC: 33.8 g/dL (ref 31.5–36.0)
MONO#: 0.6 10*3/uL (ref 0.1–0.9)
NEUT%: 52.5 % (ref 38.4–76.8)
RBC: 2.53 10*6/uL — ABNORMAL LOW (ref 3.70–5.45)
WBC: 2.8 10*3/uL — ABNORMAL LOW (ref 3.9–10.3)
lymph#: 0.7 10*3/uL — ABNORMAL LOW (ref 0.9–3.3)
nRBC: 0 % (ref 0–0)

## 2011-09-05 LAB — COMPREHENSIVE METABOLIC PANEL
ALT: 16 U/L (ref 0–35)
AST: 23 U/L (ref 0–37)
Albumin: 3.9 g/dL (ref 3.5–5.2)
Alkaline Phosphatase: 87 U/L (ref 39–117)
BUN: 20 mg/dL (ref 6–23)
CO2: 23 mEq/L (ref 19–32)
Calcium: 9 mg/dL (ref 8.4–10.5)
Chloride: 103 mEq/L (ref 96–112)
Creatinine, Ser: 0.88 mg/dL (ref 0.50–1.10)
Glucose, Bld: 83 mg/dL (ref 70–99)
Potassium: 4.7 mEq/L (ref 3.5–5.3)
Sodium: 136 mEq/L (ref 135–145)
Total Bilirubin: 0.4 mg/dL (ref 0.3–1.2)
Total Protein: 5.5 g/dL — ABNORMAL LOW (ref 6.0–8.3)

## 2011-09-05 LAB — CA 125: CA 125: 14 U/mL (ref 0.0–30.2)

## 2011-09-05 MED ORDER — SODIUM CHLORIDE 0.9 % IJ SOLN
100.0000 ug | Freq: Once | INTRAVENOUS | Status: AC
  Administered 2011-09-05: 0.1 mg via INTRADERMAL
  Filled 2011-09-05: qty 0.01

## 2011-09-05 MED ORDER — DEXAMETHASONE SODIUM PHOSPHATE 4 MG/ML IJ SOLN
20.0000 mg | Freq: Once | INTRAMUSCULAR | Status: AC
  Administered 2011-09-05: 20 mg via INTRAVENOUS

## 2011-09-05 MED ORDER — SODIUM CHLORIDE 0.9 % IJ SOLN
10.0000 mL | INTRAMUSCULAR | Status: DC | PRN
  Administered 2011-09-05: 10 mL
  Filled 2011-09-05: qty 10

## 2011-09-05 MED ORDER — HEPARIN SOD (PORK) LOCK FLUSH 100 UNIT/ML IV SOLN
500.0000 [IU] | Freq: Once | INTRAVENOUS | Status: AC | PRN
  Administered 2011-09-05: 500 [IU]
  Filled 2011-09-05: qty 5

## 2011-09-05 MED ORDER — SODIUM CHLORIDE 0.9 % IV SOLN
300.0000 mg | Freq: Once | INTRAVENOUS | Status: AC
  Administered 2011-09-05: 300 mg via INTRAVENOUS
  Filled 2011-09-05: qty 30

## 2011-09-05 MED ORDER — DOXORUBICIN HCL LIPOSOMAL CHEMO INJECTION 2 MG/ML
30.0000 mg/m2 | Freq: Once | INTRAVENOUS | Status: AC
  Administered 2011-09-05: 54 mg via INTRAVENOUS
  Filled 2011-09-05: qty 27

## 2011-09-05 MED ORDER — ONDANSETRON 16 MG/50ML IVPB (CHCC)
16.0000 mg | Freq: Once | INTRAVENOUS | Status: AC
  Administered 2011-09-05: 16 mg via INTRAVENOUS

## 2011-09-05 NOTE — Patient Instructions (Signed)
[facility] Discharge Instructions for Patients Receiving Chemotherapy  Today you received the following chemotherapy agents Doxil and Carboplatin. To help prevent nausea and vomiting after your treatment, we encourage you to take your nausea medication Zofran ODT. Begin taking it at bedtime and take it as often as prescribed for the next 48 hours. Take Compazine every six hours as needed for nausea.   If you develop nausea and vomiting that is not controlled by your nausea medication, call the clinic. If it is after clinic hours your family physician or the after hours number for the clinic or go to the Emergency Department.   BELOW ARE SYMPTOMS THAT SHOULD BE REPORTED IMMEDIATELY:  *FEVER GREATER THAN 100.5 F  *CHILLS WITH OR WITHOUT FEVER  NAUSEA AND VOMITING THAT IS NOT CONTROLLED WITH YOUR NAUSEA MEDICATION  *UNUSUAL SHORTNESS OF BREATH  *UNUSUAL BRUISING OR BLEEDING  TENDERNESS IN MOUTH AND THROAT WITH OR WITHOUT PRESENCE OF ULCERS  *URINARY PROBLEMS  *BOWEL PROBLEMS  UNUSUAL RASH Items with * indicate a potential emergency and should be followed up as soon as possible.  Feel free to call the clinic you have any questions or concerns. The clinic phone number is [phone].   I have been informed and understand all the instructions given to me. I know to contact the clinic, my physician, or go to the Emergency Department if any problems should occur. I do not have any questions at this time, but understand that I may call the clinic during office hours   should I have any questions or need assistance in obtaining follow up care.    __________________________________________  _____________  __________ Signature of Patient or Authorized Representative            Date                   Time    __________________________________________ Nurse's Signature

## 2011-09-05 NOTE — Telephone Encounter (Signed)
gve the pt her July,aug 2013 appt calendar. °

## 2011-09-05 NOTE — Progress Notes (Signed)
Carboplatin skin test negative. Placed: 1215 R anterior forearm. 1220: Neg 1230: Neg 1245: Negative.

## 2011-09-05 NOTE — Progress Notes (Signed)
ID: Regina West   DOB: Nov 21, 1929  MR#: [id number]  JXB#:[id number]  HISTORY OF PRESENT ILLNESS: The patient originally presented in the summer of 2010 with cramps and abdominal distension.  I do not have a copy of the initial evaluation, but on November 26, 2008, the patient underwent optimal debulking with bilateral salpingo-oophorectomy, omentectomy, and the placement of an intraperitoneal port.  The pathology from that procedure (Accession Number [id number] at [facility]) showed first of all, significant involvement of the omentum, minimal involvement of the right ovary and right fallopian tube and negative on the left ovary and fallopian tube, with neither ovary being enlarged, consistent with a primary peritoneal serous adenocarcinoma, described as moderately differentiated.  The sample included no lymph nodes.    The patient had an intraperitoneal port placed in the same surgery and was treated with intraperitoneal and IV chemotherapy according to GOG-252 but I am not sure which arm she was in.  (Arm 2 did carboplatin intraperitoneally and Taxol IV.  Arm 3 did cisplatin and paclitaxel intraperitoneally with paclitaxel IV.)  All arms received bevacizumab.  Unfortunately, after 4 cycles of treatment, she had acute mental status changes and was admitted here January of 2011 with what proved to be posterior reversible leukoencephalopathy and SIADH.  She had seizures, aphasia, and required intubation.  Once the patient recovered from this, she was treated with 2 cycles of single-agent carboplatin (I do not have the AUC). Her last adjuvant treatment was May 12, 2009, and her CA-125 at that time was 10.3.  Her intraperitoneal port was removed in April of 2011.    More recently, the patient was in routine follow up when her CA-125 was found to jump up to 2,269.7 (March 26, 2010).  This is 10 months after her last prior chemotherapy.  She had CTs of the chest, abdomen and pelvis January 26th which showed  ascites and enhancing peritoneal nodularity. She had had similar findings at presentation but these had completely resolved by the time she finished treatment in March of 2011. The patient was felt to be in first relapse. Subsequent treatments are as summarized below   INTERVAL HISTORY:  Regina West returns today with her daughter and daughter-in-law  for followup of Regina West's ovarian cancer. Regina West tells me she is feeling well, "as normal as she can feel". Her energy level has improved.  REVIEW OF SYSTEMS:  Regina West  is sleeping reasonably well. She's had no fevers or chills. No nausea or emesis. She drinks prune juice for mild constipation and continues to have regular bowel movements on a daily basis. She completed a course of Macrobid for a recent urinary tract infection, and currently denies any dysuria or hematuria. No cough or increased shortness of breath. No chest pain. No abdominal pain, pelvic pain, or pain elsewhere. No abnormal headaches.  A detailed review of systems is otherwise noncontributory.   PAST MEDICAL HISTORY: Significant for hysterectomy at the age of 71 with concurrent bladder "tuck up."  She also underwent appendectomy during that procedure.  She is status post prior bilateral cataract surgery. She is status post tonsillectomy and adenoidectomy.  She has a history of hypertension and diabetes. She has a history of diverticulosis.  She has a large hiatal hernia.  She has degenerative disk disease.  She has been noted to have coronary calcifications and she has hyperlipidemia.  There is a history of remote tobacco abuse. Seizure disorder as per HPI  FAMILY HISTORY The patient's father died at the age of 6 after  heart surgery for aortic stenosis.  The patient's mother died at the age of 18.  The patient has one brother in good health.  There is no other family history to her knowledge of ovarian or breast cancer.    GYNECOLOGIC HISTORY: Menarche at age 56.  She is GX P3 with  menopause in her late 50s.  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  Her husband was in sales and had a history of Parkinson's disease.  He died following a fall in the year 2000.  The patient's son Regina West lives in [place], and has a history of MS.  Daughter Regina West lives in [place]. She is a Futures trader.  Son Regina West lives in [place], [place] and is in Airline pilot. The patient has 8 grandchildren. She attends the [employer].  She is a good friend of our patients [name]   ADVANCED DIRECTIVES: in place  HEALTH MAINTENANCE: History  Substance Use Topics  . Smoking status: Former Smoker    Quit date: 05/24/1959  . Smokeless tobacco: Never Used  . Alcohol Use: No     Colonoscopy: 2008  PAP: UTD  MM: refuses  Allergies  Allergen Reactions  . Avastin (Bevacizumab) Other (See Comments)    Swelling of the brain     Current Outpatient Prescriptions  Medication Sig Dispense Refill  . acyclovir (ZOVIRAX) 400 MG tablet Take 400 mg by mouth 2 (two) times daily.      Marland Kitchen antiseptic oral rinse (BIOTENE) LIQD 15 mLs by Mouth Rinse route 2 (two) times daily.  1 Bottle  0  . cholecalciferol (VITAMIN D) 1000 UNITS tablet Take 1,000 Units by mouth 2 (two) times daily.      Marland Kitchen Epinastine HCl 0.05 % ophthalmic solution       . escitalopram (LEXAPRO) 10 MG tablet       . levETIRAcetam (KEPPRA) 500 MG tablet Take 250 mg by mouth every 12 (twelve) hours. Pt takes 1/2 tab for 250 mg dose      . lidocaine-prilocaine (EMLA) cream Apply 1 application topically as needed.      Marland Kitchen LOTEMAX 0.5 % OINT       . metoCLOPramide (REGLAN) 5 MG tablet Take 1 tablet (5 mg total) by mouth 3 (three) times daily before meals.  90 tablet  2  . mirtazapine (REMERON SOL-TAB) 15 MG disintegrating tablet Take 1 tablet (15 mg total) by mouth at bedtime.  30 tablet  3  . neomycin-polymyxin  b-dexamethasone (MAXITROL) 3.5-10000-0.1 OINT       . omeprazole (PRILOSEC) 40 MG capsule Take 1 capsule (40 mg total) by mouth at bedtime.  30 capsule  3  . ondansetron (ZOFRAN-ODT) 8 MG disintegrating tablet Take 1 tablet (8 mg total) by mouth every 8 (eight) hours as needed. nausea  20 tablet  3  . PRESCRIPTION MEDICATION Pt gets chemo at [facility]. Last treatment was on 05-16-11 followed by Dr Bevelyn Buckles      . prochlorperazine (COMPAZINE) 10 MG tablet Take 1 tablet (10 mg total) by mouth every 6 (six) hours as needed.  30 tablet  0  . prochlorperazine (COMPAZINE) 10 MG tablet        No current facility-administered medications for this visit.   Facility-Administered Medications Ordered in Other Visits  Medication Dose Route Frequency Provider Last Rate Last Dose  . CARBOplatin (PARAPLATIN) 300 mg  in sodium chloride 0.9 % 100 mL chemo infusion  300 mg Intravenous Once Lowella Dell, MD      . CARBOplatin chemo intradermal Test Dose 100 mcg  100 mcg Intradermal Once Lowella Dell, MD   0.1 mg at 09/05/11 1215  . dexamethasone (DECADRON) injection 20 mg  20 mg Intravenous Once Lowella Dell, MD   20 mg at 09/05/11 1248  . DOXOrubicin HCL LIPOSOMAL (DOXIL) 54 mg in dextrose 5 % 250 mL chemo infusion  30 mg/m2 (Treatment Plan Actual) Intravenous Once Lowella Dell, MD      . ondansetron (ZOFRAN) IVPB 16 mg  16 mg Intravenous Once Lowella Dell, MD   16 mg at 09/05/11 1248    OBJECTIVE: Elderly white woman in no acute distress Filed Vitals:   09/05/11 1113  BP: 128/73  Pulse: 72  Temp: 97.7 F (36.5 C)     Body mass index is 23.56 kg/(m^2).    ECOG FS: 2 Filed Weights   09/05/11 1113  Weight: 146 lb (66.225 kg)   Physical Exam: HEENT:  Sclerae anicteric.  Oropharynx clear.  No mucositis or candidiasis.   Nodes:  No cervical, supraclavicular, or axillary lymphadenopathy palpated.  Breast Exam:  Deferred  Lungs:  Clear to auscultation bilaterally.  No crackles, rhonchi, or  wheezes.   Heart:  Regular rate and rhythm.   Abdomen:  Soft, mildly distended, nontender.  Positive bowel sounds.  No organomegaly or masses palpated.   Musculoskeletal:  No focal spinal tenderness to palpation.  Extremities:  Benign.  No peripheral edema or cyanosis.   Skin:  Benign.   Neuro:  Nonfocal. Alert and oriented x3.    LAB RESULTS: Results for AUNDREA, HORACE (MRN [id number]) as of 08/08/2011 11:42  Ref. Range 05/02/2011 14:14 05/23/2011 14:23 06/08/2011 03:50 06/22/2011 13:35 07/11/2011 13:52  CA 125 Latest Range: 0.0-30.2 U/mL 360.8 (H) 426.8 (H) 144.4 (H) 112.4 (H) 42.7 (H)     Lab Results  Component Value Date   CA125 20.6 08/22/2011   Lab Results  Component Value Date   WBC 2.8* 09/05/2011   NEUTROABS 1.5 09/05/2011   HGB 9.1* 09/05/2011   HCT 26.9* 09/05/2011   MCV 106.3* 09/05/2011   PLT 119* 09/05/2011      Chemistry      Component Value Date/Time   NA 139 08/22/2011 1125   NA 143 05/12/2011 1337   K 4.4 08/22/2011 1125   K 4.1 05/12/2011 1337   CL 103 08/22/2011 1125   CL 92* 05/12/2011 1337   CO2 27 08/22/2011 1125   CO2 32 05/12/2011 1337   BUN 20 08/22/2011 1125   BUN 19 05/12/2011 1337   CREATININE 1.08 08/22/2011 1125   CREATININE 1.3* 05/12/2011 1337      Component Value Date/Time   CALCIUM 9.7 08/22/2011 1125   CALCIUM 9.8 05/12/2011 1337   ALKPHOS 94 08/22/2011 1125   ALKPHOS 95* 05/12/2011 1337   AST 19 08/22/2011 1125   AST 40* 05/12/2011 1337   ALT 14 08/22/2011 1125   BILITOT 0.5 08/22/2011 1125   BILITOT 0.60 05/12/2011 1337      STUDIES:  No recent studies.   ASSESSMENT: 78 year old [place] woman   (1) status post optimal debulking of a primary peritoneal serous adenocarcinoma September 2010, the tumor being moderately differentiated, pT3c NX (stage IIIC), treated according to GOG 252 with  intraperitoneal platinum and paclitaxel x4 given along with bevacizumab, complicated by SIADHafter cycle 4, also with posterior reversible  leukoencephalopathy.  After these  problems resolved she received 2 additional cycles of single-agent carboplatin completed in March 2011  (2)  She had her first recurrence February 2012  treated with carboplatin and Gemzar for a total of 6 cycles between February and June 2012.  Her CEA-125 had normalized before the beginning of cycle 5.   (3) second recurrence documented February 2013, being treated with carboplatin/doxil  PLAN:  Larayah will proceed to treatment today as scheduled for day 1 cycle 5 of carboplatin/Doxil she receive supportive IV fluids the next 2 days, and we'll continue to see her every 2 weeks for labs and followup visit. Per Dr. Darrall Dears previous note, she will likely need an additional 3-4 cycles.    We will check a urinalysis and urine culture later this week to ensure that her urinary tract infection has cleared, having just completed a course of Macrobid.   Milani and her daughter both voice understanding and agreement with our plan, she will call with any changes or problems.  Khori Rosevear    09/05/2011

## 2011-09-06 ENCOUNTER — Ambulatory Visit (HOSPITAL_BASED_OUTPATIENT_CLINIC_OR_DEPARTMENT_OTHER): Payer: Medicare Other

## 2011-09-06 VITALS — BP 143/79 | HR 69 | Temp 98.0°F

## 2011-09-06 DIAGNOSIS — Z5111 Encounter for antineoplastic chemotherapy: Secondary | ICD-10-CM

## 2011-09-06 DIAGNOSIS — C482 Malignant neoplasm of peritoneum, unspecified: Secondary | ICD-10-CM

## 2011-09-06 DIAGNOSIS — C569 Malignant neoplasm of unspecified ovary: Secondary | ICD-10-CM

## 2011-09-06 MED ORDER — SODIUM CHLORIDE 0.9 % IJ SOLN
10.0000 mL | INTRAMUSCULAR | Status: DC | PRN
  Administered 2011-09-06: 10 mL via INTRAVENOUS
  Filled 2011-09-06: qty 10

## 2011-09-06 MED ORDER — SODIUM CHLORIDE 0.9 % IV SOLN
INTRAVENOUS | Status: AC
  Administered 2011-09-06: 09:00:00 via INTRAVENOUS

## 2011-09-06 MED ORDER — HEPARIN SOD (PORK) LOCK FLUSH 100 UNIT/ML IV SOLN
500.0000 [IU] | Freq: Once | INTRAVENOUS | Status: AC
  Administered 2011-09-06: 500 [IU] via INTRAVENOUS
  Filled 2011-09-06: qty 5

## 2011-09-07 ENCOUNTER — Other Ambulatory Visit: Payer: Self-pay | Admitting: Oncology

## 2011-09-07 ENCOUNTER — Other Ambulatory Visit (HOSPITAL_BASED_OUTPATIENT_CLINIC_OR_DEPARTMENT_OTHER): Payer: Medicare Other | Admitting: Lab

## 2011-09-07 ENCOUNTER — Ambulatory Visit (HOSPITAL_BASED_OUTPATIENT_CLINIC_OR_DEPARTMENT_OTHER): Payer: Medicare Other

## 2011-09-07 VITALS — BP 118/74 | HR 61 | Temp 98.1°F

## 2011-09-07 DIAGNOSIS — C569 Malignant neoplasm of unspecified ovary: Secondary | ICD-10-CM

## 2011-09-07 DIAGNOSIS — R300 Dysuria: Secondary | ICD-10-CM

## 2011-09-07 DIAGNOSIS — C482 Malignant neoplasm of peritoneum, unspecified: Secondary | ICD-10-CM

## 2011-09-07 DIAGNOSIS — N390 Urinary tract infection, site not specified: Secondary | ICD-10-CM

## 2011-09-07 LAB — CBC WITH DIFFERENTIAL/PLATELET
Basophils Absolute: 0 10*3/uL (ref 0.0–0.1)
Eosinophils Absolute: 0 10*3/uL (ref 0.0–0.5)
HCT: 26.6 % — ABNORMAL LOW (ref 34.8–46.6)
HGB: 9 g/dL — ABNORMAL LOW (ref 11.6–15.9)
MONO#: 0.8 10*3/uL (ref 0.1–0.9)
NEUT#: 2.5 10*3/uL (ref 1.5–6.5)
NEUT%: 55.1 % (ref 38.4–76.8)
WBC: 4.5 10*3/uL (ref 3.9–10.3)
lymph#: 1.1 10*3/uL (ref 0.9–3.3)

## 2011-09-07 LAB — URINALYSIS, MICROSCOPIC - CHCC
Bilirubin (Urine): NEGATIVE
Blood: NEGATIVE
Ketones: NEGATIVE mg/dL
Specific Gravity, Urine: 1.01 (ref 1.003–1.035)
pH: 6 (ref 4.6–8.0)

## 2011-09-07 LAB — COMPREHENSIVE METABOLIC PANEL
Albumin: 3.9 g/dL (ref 3.5–5.2)
BUN: 26 mg/dL — ABNORMAL HIGH (ref 6–23)
Calcium: 9.3 mg/dL (ref 8.4–10.5)
Chloride: 106 mEq/L (ref 96–112)
Glucose, Bld: 134 mg/dL — ABNORMAL HIGH (ref 70–99)
Potassium: 4.3 mEq/L (ref 3.5–5.3)
Sodium: 140 mEq/L (ref 135–145)
Total Protein: 5.9 g/dL — ABNORMAL LOW (ref 6.0–8.3)

## 2011-09-07 LAB — MAGNESIUM: Magnesium: 1.6 mg/dL (ref 1.5–2.5)

## 2011-09-07 MED ORDER — SODIUM CHLORIDE 0.9 % IV SOLN
INTRAVENOUS | Status: AC
  Administered 2011-09-07: 10:00:00 via INTRAVENOUS

## 2011-09-11 LAB — URINE CULTURE

## 2011-09-19 ENCOUNTER — Other Ambulatory Visit: Payer: Medicare Other | Admitting: Lab

## 2011-09-21 ENCOUNTER — Ambulatory Visit (HOSPITAL_BASED_OUTPATIENT_CLINIC_OR_DEPARTMENT_OTHER): Payer: Medicare Other | Admitting: Physician Assistant

## 2011-09-21 ENCOUNTER — Other Ambulatory Visit (HOSPITAL_BASED_OUTPATIENT_CLINIC_OR_DEPARTMENT_OTHER): Payer: Medicare Other | Admitting: Lab

## 2011-09-21 ENCOUNTER — Telehealth: Payer: Self-pay | Admitting: *Deleted

## 2011-09-21 ENCOUNTER — Encounter: Payer: Self-pay | Admitting: Physician Assistant

## 2011-09-21 VITALS — BP 113/69 | HR 80 | Temp 98.0°F | Ht 66.0 in | Wt 145.4 lb

## 2011-09-21 DIAGNOSIS — C569 Malignant neoplasm of unspecified ovary: Secondary | ICD-10-CM

## 2011-09-21 DIAGNOSIS — C482 Malignant neoplasm of peritoneum, unspecified: Secondary | ICD-10-CM

## 2011-09-21 DIAGNOSIS — N390 Urinary tract infection, site not specified: Secondary | ICD-10-CM

## 2011-09-21 DIAGNOSIS — D630 Anemia in neoplastic disease: Secondary | ICD-10-CM | POA: Insufficient documentation

## 2011-09-21 DIAGNOSIS — C7960 Secondary malignant neoplasm of unspecified ovary: Secondary | ICD-10-CM

## 2011-09-21 DIAGNOSIS — D649 Anemia, unspecified: Secondary | ICD-10-CM

## 2011-09-21 LAB — COMPREHENSIVE METABOLIC PANEL
ALT: 14 U/L (ref 0–35)
AST: 20 U/L (ref 0–37)
CO2: 27 mEq/L (ref 19–32)
Chloride: 100 mEq/L (ref 96–112)
Sodium: 135 mEq/L (ref 135–145)
Total Bilirubin: 0.4 mg/dL (ref 0.3–1.2)
Total Protein: 5.8 g/dL — ABNORMAL LOW (ref 6.0–8.3)

## 2011-09-21 LAB — CBC WITH DIFFERENTIAL/PLATELET
BASO%: 0.2 % (ref 0.0–2.0)
MCHC: 34.3 g/dL (ref 31.5–36.0)
MONO#: 0.3 10*3/uL (ref 0.1–0.9)
RBC: 2.26 10*6/uL — ABNORMAL LOW (ref 3.70–5.45)
WBC: 2.1 10*3/uL — ABNORMAL LOW (ref 3.9–10.3)
lymph#: 0.3 10*3/uL — ABNORMAL LOW (ref 0.9–3.3)

## 2011-09-21 LAB — CA 125: CA 125: 13.8 U/mL (ref 0.0–30.2)

## 2011-09-21 MED ORDER — CIPROFLOXACIN HCL 500 MG PO TABS: 500.0000 mg | ORAL_TABLET | Freq: Two times a day (BID) | ORAL | Status: AC

## 2011-09-21 NOTE — Progress Notes (Signed)
ID: Regina West   DOB: February 01, 1930  MR#: [id number]  YQM#:[id number]  HISTORY OF PRESENT ILLNESS: The patient originally presented in the summer of 2010 with cramps and abdominal distension.  I do not have a copy of the initial evaluation, but on November 26, 2008, the patient underwent optimal debulking with bilateral salpingo-oophorectomy, omentectomy, and the placement of an intraperitoneal port.  The pathology from that procedure (Accession Number [id number] at [facility]) showed first of all, significant involvement of the omentum, minimal involvement of the right ovary and right fallopian tube and negative on the left ovary and fallopian tube, with neither ovary being enlarged, consistent with a primary peritoneal serous adenocarcinoma, described as moderately differentiated.  The sample included no lymph nodes.    The patient had an intraperitoneal port placed in the same surgery and was treated with intraperitoneal and IV chemotherapy according to GOG-252 but I am not sure which arm she was in.  (Arm 2 did carboplatin intraperitoneally and Taxol IV.  Arm 3 did cisplatin and paclitaxel intraperitoneally with paclitaxel IV.)  All arms received bevacizumab.  Unfortunately, after 4 cycles of treatment, she had acute mental status changes and was admitted here January of 2011 with what proved to be posterior reversible leukoencephalopathy and SIADH.  She had seizures, aphasia, and required intubation.  Once the patient recovered from this, she was treated with 2 cycles of single-agent carboplatin (I do not have the AUC). Her last adjuvant treatment was May 12, 2009, and her CA-125 at that time was 10.3.  Her intraperitoneal port was removed in April of 2011.    More recently, the patient was in routine follow up when her CA-125 was found to jump up to 2,269.7 (March 26, 2010).  This is 10 months after her last prior chemotherapy.  She had CTs of the chest, abdomen and pelvis January 26th which showed  ascites and enhancing peritoneal nodularity. She had had similar findings at presentation but these had completely resolved by the time she finished treatment in March of 2011. The patient was felt to be in first relapse. Subsequent treatments are as summarized below   INTERVAL HISTORY:  Quentin returns today with her daughter  for followup of Asiah's ovarian cancer. Heavenlee continues to feel well with only some mild fatigue today. Interval history is generally unremarkable, with the exception of some increased dysuria and urinary frequency since her last visit here.  REVIEW OF SYSTEMS:  Suellyn has had no fevers or chills. No rashes or skin changes. No signs of abnormal bleeding whatsoever. She tends to be slightly constipated and drink spring juice regularly with good results. She is having daily bowel movements. She denies any nausea or emesis. No increased cough or shortness of breath. No chest pain or palpitations. No abnormal headaches or dizziness. No unusual myalgias or arthralgias, and no abdominal or pelvic pain.  A detailed review of systems is otherwise noncontributory.   PAST MEDICAL HISTORY: Significant for hysterectomy at the age of 48 with concurrent bladder "tuck up."  She also underwent appendectomy during that procedure.  She is status post prior bilateral cataract surgery. She is status post tonsillectomy and adenoidectomy.  She has a history of hypertension and diabetes. She has a history of diverticulosis.  She has a large hiatal hernia.  She has degenerative disk disease.  She has been noted to have coronary calcifications and she has hyperlipidemia.  There is a history of remote tobacco abuse. Seizure disorder as per HPI  FAMILY HISTORY The  patient's father died at the age of 42 after heart surgery for aortic stenosis.  The patient's mother died at the age of 55.  The patient has one brother in good health.  There is no other family history to her knowledge of ovarian or breast  cancer.    GYNECOLOGIC HISTORY: Menarche at age 89.  She is GX P3 with menopause in her late 81s.  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  Her husband was in sales and had a history of Parkinson's disease.  He died following a fall in the year 2000.  The patient's son [name] lives in [place], and has a history of MS.  Daughter [name] lives in [place]. She is a Futures trader.  Son [name] lives in [place], [place] and is in Airline pilot. The patient has 8 grandchildren. She attends the [employer].  She is a good friend of our patients [name]   ADVANCED DIRECTIVES: in place  HEALTH MAINTENANCE: History  Substance Use Topics  . Smoking status: Former Smoker    Quit date: 05/24/1959  . Smokeless tobacco: Never Used  . Alcohol Use: No     Colonoscopy: 2008  PAP: UTD  MM: refuses  Allergies  Allergen Reactions  . Avastin (Bevacizumab) Other (See Comments)    Swelling of the brain     Current Outpatient Prescriptions  Medication Sig Dispense Refill  . acyclovir (ZOVIRAX) 400 MG tablet Take 400 mg by mouth 2 (two) times daily.      Marland Kitchen antiseptic oral rinse (BIOTENE) LIQD 15 mLs by Mouth Rinse route 2 (two) times daily.  1 Bottle  0  . cholecalciferol (VITAMIN D) 1000 UNITS tablet Take 1,000 Units by mouth 2 (two) times daily.      . ciprofloxacin (CIPRO) 500 MG tablet Take 1 tablet (500 mg total) by mouth 2 (two) times daily.  14 tablet  0  . Epinastine HCl 0.05 % ophthalmic solution       . escitalopram (LEXAPRO) 10 MG tablet       . levETIRAcetam (KEPPRA) 500 MG tablet Take 250 mg by mouth every 12 (twelve) hours. Pt takes 1/2 tab for 250 mg dose      . lidocaine-prilocaine (EMLA) cream Apply 1 application topically as needed.      Marland Kitchen LOTEMAX 0.5 % OINT       . metoCLOPramide (REGLAN) 5 MG tablet Take 1 tablet (5 mg total) by mouth 3 (three) times daily  before meals.  90 tablet  2  . mirtazapine (REMERON SOL-TAB) 15 MG disintegrating tablet Take 1 tablet (15 mg total) by mouth at bedtime.  30 tablet  3  . neomycin-polymyxin b-dexamethasone (MAXITROL) 3.5-10000-0.1 OINT       . omeprazole (PRILOSEC) 40 MG capsule Take 1 capsule (40 mg total) by mouth at bedtime.  30 capsule  3  . ondansetron (ZOFRAN-ODT) 8 MG disintegrating tablet Take 1 tablet (8 mg total) by mouth every 8 (eight) hours as needed. nausea  20 tablet  3  . PRESCRIPTION MEDICATION Pt gets chemo at [facility]. Last treatment was on 05-16-11 followed by Dr Bevelyn Buckles      . prochlorperazine (COMPAZINE) 10 MG tablet Take 1 tablet (10 mg total) by mouth every 6 (six) hours as needed.  30 tablet  0  . prochlorperazine (COMPAZINE) 10 MG tablet  OBJECTIVE: Elderly white woman in no acute distress Filed Vitals:   09/21/11 1120  BP: 113/69  Pulse: 80  Temp: 98 F (36.7 C)     Body mass index is 23.47 kg/(m^2).    ECOG FS: 2 Filed Weights   09/21/11 1120  Weight: 145 lb 6.4 oz (65.953 kg)   Physical Exam: HEENT:  Sclerae anicteric.  Oropharynx clear.  No mucositis or candidiasis.  Buccal mucosa is pink and moist Nodes:  No cervical, supraclavicular, or axillary lymphadenopathy palpated.  Breast Exam:  Deferred Lungs:  Clear to auscultation bilaterally.  No crackles, rhonchi, or wheezes.   Heart:  Regular rate and rhythm.   Abdomen:  Soft, mildly distended, nontender.  Positive bowel sounds.  No organomegaly or masses palpated.   Musculoskeletal:  No focal spinal tenderness to palpation.  Extremities:  Benign.  No peripheral edema or cyanosis.   Skin:  Benign. There are 2 hyperpigmented seborrheic keratoses on the inferior portion of the right breast, and one on the left breast. These appear benign, with no additional skin changes noted.  Neuro:  Nonfocal. Alert and oriented x3.    LAB RESULTS: Results for MIRIAN, CASCO (MRN [id number]) as of 08/08/2011 11:42  Ref. Range  05/02/2011 14:14 05/23/2011 14:23 06/08/2011 03:50 06/22/2011 13:35 07/11/2011 13:52  CA 125 Latest Range: 0.0-30.2 U/mL 360.8 (H) 426.8 (H) 144.4 (H) 112.4 (H) 42.7 (H)     Lab Results  Component Value Date   CA125 13.9 09/07/2011   Lab Results  Component Value Date   WBC 2.1* 09/21/2011   NEUTROABS 1.5 09/21/2011   HGB 8.7* 09/21/2011   HCT 25.4* 09/21/2011   MCV 112.4* 09/21/2011   PLT 47* 09/21/2011      Chemistry      Component Value Date/Time   NA 140 09/07/2011 0939   NA 143 05/12/2011 1337   K 4.3 09/07/2011 0939   K 4.1 05/12/2011 1337   CL 106 09/07/2011 0939   CL 92* 05/12/2011 1337   CO2 20 09/07/2011 0939   CO2 32 05/12/2011 1337   BUN 26* 09/07/2011 0939   BUN 19 05/12/2011 1337   CREATININE 1.02 09/07/2011 0939   CREATININE 1.3* 05/12/2011 1337      Component Value Date/Time   CALCIUM 9.3 09/07/2011 0939   CALCIUM 9.8 05/12/2011 1337   ALKPHOS 82 09/07/2011 0939   ALKPHOS 95* 05/12/2011 1337   AST 38* 09/07/2011 0939   AST 40* 05/12/2011 1337   ALT 34 09/07/2011 0939   BILITOT 0.3 09/07/2011 0939   BILITOT 0.60 05/12/2011 1337      STUDIES:  No recent studies.   ASSESSMENT: 78 year old [place] woman   (1) status post optimal debulking of a primary peritoneal serous adenocarcinoma September 2010, the tumor being moderately differentiated, pT3c NX (stage IIIC), treated according to GOG 252 with  intraperitoneal platinum and paclitaxel x4 given along with bevacizumab, complicated by SIADHafter cycle 4, also with posterior reversible leukoencephalopathy.  After these problems resolved she received 2 additional cycles of single-agent carboplatin completed in March 2011  (2)  She had her first recurrence February 2012  treated with carboplatin and Gemzar for a total of 6 cycles between February and June 2012.  Her CEA-125 had normalized before the beginning of cycle 5.   (3) second recurrence documented February 2013, being treated with carboplatin/doxil  PLAN:  Chelsie continues to tolerate this  regimen well. She does believe the supportive IV fluids on days 2 and 3 help significantly. Per  Dr. Darnelle Catalan previous note she will likely need an additional 3-4 cycles, but she scheduled to speak with him again when she returns on July 29. She'll receive her sixth dose of carbo/Doxil that day.  In the meanwhile, I am starting Johnny Bridge on Cipro, 500 mg by mouth twice a day for her symptomatic urinary tract infection. I am also having her return next week for labs only. We'll keep an eye on her hemoglobin, and can consider transfusion if and when that becomes necessary. I've also ordered a repeat echocardiogram, with results available prior to her appointment on July 29.  All of this was reviewed in detail with Malissie today and she voices her understanding and agreement. She will call with any changes or problems.  Leonid Manus    09/21/2011

## 2011-09-21 NOTE — Telephone Encounter (Signed)
Gave patient appointment for 09-26-2011 at 10:15am lab only appointment

## 2011-09-23 ENCOUNTER — Telehealth: Payer: Self-pay | Admitting: Oncology

## 2011-09-23 NOTE — Telephone Encounter (Signed)
S/w the pt and she is aware of her echo appt at [facility] on 09/29/2011

## 2011-09-26 ENCOUNTER — Other Ambulatory Visit (HOSPITAL_BASED_OUTPATIENT_CLINIC_OR_DEPARTMENT_OTHER): Payer: Medicare Other | Admitting: Lab

## 2011-09-26 DIAGNOSIS — C569 Malignant neoplasm of unspecified ovary: Secondary | ICD-10-CM

## 2011-09-26 DIAGNOSIS — D649 Anemia, unspecified: Secondary | ICD-10-CM

## 2011-09-26 LAB — COMPREHENSIVE METABOLIC PANEL
ALT: 14 U/L (ref 0–35)
AST: 21 U/L (ref 0–37)
Calcium: 9.8 mg/dL (ref 8.4–10.5)
Chloride: 102 mEq/L (ref 96–112)
Creatinine, Ser: 1.19 mg/dL — ABNORMAL HIGH (ref 0.50–1.10)
Sodium: 139 mEq/L (ref 135–145)
Total Bilirubin: 0.4 mg/dL (ref 0.3–1.2)
Total Protein: 6.1 g/dL (ref 6.0–8.3)

## 2011-09-26 LAB — CBC WITH DIFFERENTIAL/PLATELET
Basophils Absolute: 0 10*3/uL (ref 0.0–0.1)
EOS%: 1 % (ref 0.0–7.0)
HGB: 9.7 g/dL — ABNORMAL LOW (ref 11.6–15.9)
LYMPH%: 26.4 % (ref 14.0–49.7)
MCH: 38.9 pg — ABNORMAL HIGH (ref 25.1–34.0)
MCV: 112.8 fL — ABNORMAL HIGH (ref 79.5–101.0)
MONO%: 20.3 % — ABNORMAL HIGH (ref 0.0–14.0)
NEUT%: 51.9 % (ref 38.4–76.8)
Platelets: 54 10*3/uL — ABNORMAL LOW (ref 145–400)
RDW: 16.9 % — ABNORMAL HIGH (ref 11.2–14.5)

## 2011-09-26 LAB — HOLD TUBE, BLOOD BANK

## 2011-09-29 ENCOUNTER — Ambulatory Visit (HOSPITAL_COMMUNITY)
Admission: RE | Admit: 2011-09-29 | Discharge: 2011-09-29 | Disposition: A | Discharge status: Home / Self Care | Payer: Medicare Other | Source: Ambulatory Visit | Attending: Oncology | Admitting: Oncology

## 2011-09-29 DIAGNOSIS — C569 Malignant neoplasm of unspecified ovary: Secondary | ICD-10-CM | POA: Insufficient documentation

## 2011-09-29 DIAGNOSIS — Z09 Encounter for follow-up examination after completed treatment for conditions other than malignant neoplasm: Secondary | ICD-10-CM | POA: Insufficient documentation

## 2011-09-29 DIAGNOSIS — I517 Cardiomegaly: Secondary | ICD-10-CM

## 2011-09-29 NOTE — Progress Notes (Signed)
  Echocardiogram 2D Echocardiogram has been performed.  Regina West 09/29/2011, 10:36 AM

## 2011-10-03 ENCOUNTER — Ambulatory Visit (HOSPITAL_BASED_OUTPATIENT_CLINIC_OR_DEPARTMENT_OTHER): Payer: Medicare Other | Admitting: Oncology

## 2011-10-03 ENCOUNTER — Other Ambulatory Visit (HOSPITAL_BASED_OUTPATIENT_CLINIC_OR_DEPARTMENT_OTHER): Payer: Medicare Other | Admitting: Lab

## 2011-10-03 ENCOUNTER — Other Ambulatory Visit: Payer: Self-pay | Admitting: Oncology

## 2011-10-03 ENCOUNTER — Ambulatory Visit (HOSPITAL_BASED_OUTPATIENT_CLINIC_OR_DEPARTMENT_OTHER): Payer: Medicare Other

## 2011-10-03 ENCOUNTER — Telehealth: Payer: Self-pay | Admitting: *Deleted

## 2011-10-03 VITALS — BP 109/70 | HR 68 | Temp 98.9°F | Ht 66.0 in | Wt 144.2 lb

## 2011-10-03 DIAGNOSIS — C569 Malignant neoplasm of unspecified ovary: Secondary | ICD-10-CM

## 2011-10-03 DIAGNOSIS — C482 Malignant neoplasm of peritoneum, unspecified: Secondary | ICD-10-CM

## 2011-10-03 DIAGNOSIS — Z452 Encounter for adjustment and management of vascular access device: Secondary | ICD-10-CM

## 2011-10-03 DIAGNOSIS — Z5111 Encounter for antineoplastic chemotherapy: Secondary | ICD-10-CM

## 2011-10-03 LAB — COMPREHENSIVE METABOLIC PANEL
Albumin: 4.2 g/dL (ref 3.5–5.2)
Alkaline Phosphatase: 85 U/L (ref 39–117)
BUN: 23 mg/dL (ref 6–23)
Creatinine, Ser: 1.1 mg/dL (ref 0.50–1.10)
Glucose, Bld: 88 mg/dL (ref 70–99)
Total Bilirubin: 0.4 mg/dL (ref 0.3–1.2)

## 2011-10-03 LAB — CBC WITH DIFFERENTIAL/PLATELET
Basophils Absolute: 0 10*3/uL (ref 0.0–0.1)
EOS%: 1.7 % (ref 0.0–7.0)
HCT: 27.6 % — ABNORMAL LOW (ref 34.8–46.6)
HGB: 9.6 g/dL — ABNORMAL LOW (ref 11.6–15.9)
MCH: 37.2 pg — ABNORMAL HIGH (ref 25.1–34.0)
MCV: 107 fL — ABNORMAL HIGH (ref 79.5–101.0)
MONO%: 22.9 % — ABNORMAL HIGH (ref 0.0–14.0)
NEUT%: 55.2 % (ref 38.4–76.8)

## 2011-10-03 MED ORDER — DOXORUBICIN HCL LIPOSOMAL CHEMO INJECTION 2 MG/ML
30.0000 mg/m2 | Freq: Once | INTRAVENOUS | Status: AC
  Administered 2011-10-03: 54 mg via INTRAVENOUS
  Filled 2011-10-03: qty 27

## 2011-10-03 MED ORDER — SODIUM CHLORIDE 0.9 % IJ SOLN
10.0000 mL | INTRAMUSCULAR | Status: DC | PRN
  Administered 2011-10-03: 10 mL
  Filled 2011-10-03: qty 10

## 2011-10-03 MED ORDER — SODIUM CHLORIDE 0.9 % IJ SOLN
100.0000 ug | Freq: Once | INTRAVENOUS | Status: AC
  Administered 2011-10-03: 0.1 mg via INTRADERMAL
  Filled 2011-10-03: qty 0.01

## 2011-10-03 MED ORDER — DEXAMETHASONE SODIUM PHOSPHATE 4 MG/ML IJ SOLN
20.0000 mg | Freq: Once | INTRAMUSCULAR | Status: AC
  Administered 2011-10-03: 20 mg via INTRAVENOUS

## 2011-10-03 MED ORDER — ONDANSETRON 16 MG/50ML IVPB (CHCC)
16.0000 mg | Freq: Once | INTRAVENOUS | Status: AC
  Administered 2011-10-03: 16 mg via INTRAVENOUS

## 2011-10-03 MED ORDER — ALTEPLASE 2 MG IJ SOLR
2.0000 mg | Freq: Once | INTRAMUSCULAR | Status: AC | PRN
  Administered 2011-10-03: 2 mg
  Filled 2011-10-03: qty 2

## 2011-10-03 MED ORDER — SODIUM CHLORIDE 0.9 % IV SOLN
300.0000 mg | Freq: Once | INTRAVENOUS | Status: AC
  Administered 2011-10-03: 300 mg via INTRAVENOUS
  Filled 2011-10-03: qty 30

## 2011-10-03 MED ORDER — HEPARIN SOD (PORK) LOCK FLUSH 100 UNIT/ML IV SOLN
500.0000 [IU] | Freq: Once | INTRAVENOUS | Status: AC | PRN
  Administered 2011-10-03: 500 [IU]
  Filled 2011-10-03: qty 5

## 2011-10-03 NOTE — Progress Notes (Signed)
Per Dr Darnelle Catalan, patient to be treated dispite platelet count.  1223- CarCarbo test dose site unremarkable. No signs of redness or swelling noted. 1233-bo test dose site unremarkable. No signs of redness or swelling noted.  1248-Carbo test dose site unremarkable. No signs of redness or swelling noted.

## 2011-10-03 NOTE — Telephone Encounter (Signed)
Gave patient appointment for 10-31-2011 arrival time 10:15am ct and chest x-ray gave patient appointment for 11-10-2011 at 10:00am printed out calendar and gave to the patient

## 2011-10-03 NOTE — Progress Notes (Signed)
Port-a-cath HN repositioned and flushed multiple times with saline  without success of blood return. 1240HR TPA instilled into port and at around 1300hr, Positive blood return noted, 10 ml of blood wasted and lab drawn via port.  IVF dripped well via gravity and treatment started.

## 2011-10-03 NOTE — Progress Notes (Signed)
ID: Regina West   DOB: 76-Oct-1931  MR#: [id number]  GUY#:[id number]  HISTORY OF PRESENT ILLNESS: The patient originally presented in the summer of 2010 with cramps and abdominal distension.  I do not have a copy of the initial evaluation, but on November 26, 2008, the patient underwent optimal debulking with bilateral salpingo-oophorectomy, omentectomy, and the placement of an intraperitoneal port.  The pathology from that procedure (Accession Number [id number] at [facility]) showed first of all, significant involvement of the omentum, minimal involvement of the right ovary and right fallopian tube and negative on the left ovary and fallopian tube, with neither ovary being enlarged, consistent with a primary peritoneal serous adenocarcinoma, described as moderately differentiated.  The sample included no lymph nodes.    The patient had an intraperitoneal port placed in the same surgery and was treated with intraperitoneal and IV chemotherapy according to GOG-252 but I am not sure which arm she was in.  (Arm 2 did carboplatin intraperitoneally and Taxol IV.  Arm 3 did cisplatin and paclitaxel intraperitoneally with paclitaxel IV.)  All arms received bevacizumab.  Unfortunately, after 4 cycles of treatment, she had acute mental status changes and was admitted here January of 2011 with what proved to be posterior reversible leukoencephalopathy and SIADH.  She had seizures, aphasia, and required intubation.  Once the patient recovered from this, she was treated with 2 cycles of single-agent carboplatin (I do not have the AUC). Her last adjuvant treatment was May 12, 2009, and her CA-125 at that time was 10.3.  Her intraperitoneal port was removed in April of 2011.    More recently, the patient was in routine follow up when her CA-125 was found to jump up to 2,269.7 (March 26, 2010).  This is 10 months after her last prior chemotherapy.  She had CTs of the chest, abdomen and pelvis January 26th which showed  ascites and enhancing peritoneal nodularity. She had had similar findings at presentation but these had completely resolved by the time she finished treatment in March of 2011. The patient was felt to be in first relapse. Subsequent treatments are as summarized below   INTERVAL HISTORY:  Kiylah returns today with her daughter  and daughter in lawfor followup of Renesmay's ovarian cancer. Fortunata is still staying at [facility], but she also has kept her home, and she is wondering when she is going to finally get back to her own place.  REVIEW OF SYSTEMS:  She is tolerating treatment remarkably well. She still has most of her here. She's had no fevers, drenching sweats, significant weight loss, or neuropathy. There have been no symptoms of congestive heart failure. She had a blood blister in her lower lip, and some skin rashes here in there which have resolved. Otherwise a detailed review of systems today was noncontributory.   PAST MEDICAL HISTORY: Significant for hysterectomy at the age of 27 with concurrent bladder "tuck up."  She also underwent appendectomy during that procedure.  She is status post prior bilateral cataract surgery. She is status post tonsillectomy and adenoidectomy.  She has a history of hypertension and diabetes. She has a history of diverticulosis.  She has a large hiatal hernia.  She has degenerative disk disease.  She has been noted to have coronary calcifications and she has hyperlipidemia.  There is a history of remote tobacco abuse. Seizure disorder as per HPI  FAMILY HISTORY The patient's father died at the age of 55 after heart surgery for aortic stenosis.  The patient's mother died  at the age of 23.  The patient has one brother in good health.  There is no other family history to her knowledge of ovarian or breast cancer.    GYNECOLOGIC HISTORY: Menarche at age 78.  She is GX P3 with menopause in her late 43s.  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  Her husband was in sales and had a history of Parkinson's disease.  He died following a fall in the year 2000.  The patient's son [name] lives in [place], and has a history of MS.  Daughter [name] lives in [place]. She is a Futures trader.  Son [name] lives in [place], [place] and is in Airline pilot. The patient has 8 grandchildren. She attends the [employer].  She is a good friend of our patients [name]   ADVANCED DIRECTIVES: in place  HEALTH MAINTENANCE: History  Substance Use Topics  . Smoking status: Former Smoker    Quit date: 05/24/1959  . Smokeless tobacco: Never Used  . Alcohol Use: No     Colonoscopy: 2008  PAP: UTD  MM: refuses  Allergies  Allergen Reactions  . Avastin (Bevacizumab) Other (See Comments)    Swelling of the brain     Current Outpatient Prescriptions  Medication Sig Dispense Refill  . acyclovir (ZOVIRAX) 400 MG tablet Take 400 mg by mouth 2 (two) times daily.      Marland Kitchen antiseptic oral rinse (BIOTENE) LIQD 15 mLs by Mouth Rinse route 2 (two) times daily.  1 Bottle  0  . cholecalciferol (VITAMIN D) 1000 UNITS tablet Take 1,000 Units by mouth 2 (two) times daily.      Marland Kitchen Epinastine HCl 0.05 % ophthalmic solution       . escitalopram (LEXAPRO) 10 MG tablet       . levETIRAcetam (KEPPRA) 500 MG tablet Take 250 mg by mouth every 12 (twelve) hours. Pt takes 1/2 tab for 250 mg dose      . lidocaine-prilocaine (EMLA) cream Apply 1 application topically as needed.      . metoCLOPramide (REGLAN) 5 MG tablet Take 1 tablet (5 mg total) by mouth 3 (three) times daily before meals.  90 tablet  2  . omeprazole (PRILOSEC) 40 MG capsule Take 1 capsule (40 mg total) by mouth at bedtime.  30 capsule  3  . LOTEMAX 0.5 % OINT       . mirtazapine (REMERON SOL-TAB) 15 MG disintegrating tablet Take 1 tablet (15 mg total) by mouth at bedtime.  30 tablet  3  .  neomycin-polymyxin b-dexamethasone (MAXITROL) 3.5-10000-0.1 OINT       . ondansetron (ZOFRAN-ODT) 8 MG disintegrating tablet Take 1 tablet (8 mg total) by mouth every 8 (eight) hours as needed. nausea  20 tablet  3  . PRESCRIPTION MEDICATION Pt gets chemo at [facility]. Last treatment was on 05-16-11 followed by Dr Bevelyn Buckles      . prochlorperazine (COMPAZINE) 10 MG tablet Take 1 tablet (10 mg total) by mouth every 6 (six) hours as needed.  30 tablet  0  . prochlorperazine (COMPAZINE) 10 MG tablet         OBJECTIVE: Elderly white woman who appears well Filed Vitals:   10/03/11 1108  BP: 109/70  Pulse: 68  Temp: 98.9 F (37.2 C)     Body mass index is 23.27 kg/(m^2).    ECOG FS:  2 Filed Weights   10/03/11 1108  Weight: 144 lb 3.2 oz (65.409 kg)   Physical Exam: HEENT:  Sclerae anicteric.  Oropharynx clear.  No mucositis or candidiasis.  The sore on her lower lip is healing nicely Nodes:  No cervical, supraclavicular, or axillary lymphadenopathy palpated.  Breast Exam:  Deferred Lungs:  Clear to auscultation bilaterally.  No crackles, rhonchi, or wheezes.   Heart:  Regular rate and rhythm.   Abdomen:  Soft, mildly distended, nontender.  Positive bowel sounds.  No organomegaly or masses palpated.   Musculoskeletal:  No focal spinal tenderness to palpation.  Extremities:  No peripheral edema or cyanosis.   Skin:  Benign.  Neuro:  Nonfocal. Alert and oriented x3.    LAB RESULTS:   Lab Results  Component Value Date   CA125 13.8 09/21/2011   Lab Results  Component Value Date   WBC 2.9* 10/03/2011   NEUTROABS 1.6 10/03/2011   HGB 9.6* 10/03/2011   HCT 27.6* 10/03/2011   MCV 107.0* 10/03/2011   PLT 95* 10/03/2011      Chemistry      Component Value Date/Time   NA 139 09/26/2011 1034   NA 143 05/12/2011 1337   K 4.1 09/26/2011 1034   K 4.1 05/12/2011 1337   CL 102 09/26/2011 1034   CL 92* 05/12/2011 1337   CO2 30 09/26/2011 1034   CO2 32 05/12/2011 1337   BUN 19 09/26/2011 1034   BUN 19  05/12/2011 1337   CREATININE 1.19* 09/26/2011 1034   CREATININE 1.3* 05/12/2011 1337      Component Value Date/Time   CALCIUM 9.8 09/26/2011 1034   CALCIUM 9.8 05/12/2011 1337   ALKPHOS 96 09/26/2011 1034   ALKPHOS 95* 05/12/2011 1337   AST 21 09/26/2011 1034   AST 40* 05/12/2011 1337   ALT 14 09/26/2011 1034   BILITOT 0.4 09/26/2011 1034   BILITOT 0.60 05/12/2011 1337      STUDIES:  [name]* *[facility]* [address] [place], [place] [address] [phone]  ------------------------------------------------------------ Transthoracic Echocardiography  Patient: Aisha, Greenberger MR #: [id number] Study Date: 09/29/2011 Gender: F Age: 55 Height: 170.2cm Weight: 65.3kg BSA: 1.25m^2 Pt. Status: Room:  PERFORMING [facility] ATTENDING Chrisean Kloth, Cicero Duck REFERRING Jacky Kindle, Richard Artist Pais, Amy SONOGRAPHER Emelia Loron, RDCS cc:  ------------------------------------------------------------ LV EF: 60% - 65%  ------------------------------------------------------------ History: PMH: Folllow-up monitoring for chemotherapy. Ovarian cancer.  ------------------------------------------------------------ Study Conclusions  - Left ventricle: The cavity size was normal. Wall thickness was increased in a pattern of mild LVH. Systolic function was normal. The estimated ejection fraction was in the range of 60% to 65%. Wall motion was normal; there were no regional wall motion abnormalities. Indeterminant diastolic function. - Aortic valve: There was no stenosis. - Mitral valve: No significant regurgitation. - Left atrium: The atrium was mildly dilated. - Right ventricle: The cavity size was normal. Systolic function was normal. - Pulmonary arteries: No complete TR doppler jet so unable to estimate PA systolic pressure. - Inferior vena cava: The vessel was normal in size; the respirophasic diameter changes were in the normal  range (= 50%); findings are consistent with normal central venous pressure. Impressions:  - Normal LV size and systolic function, EF 60-65%. Mild LV hypertrophy. Normal RV size and systolic function    ASSESSMENT: 78 y.o.  [place] woman   (1) status post optimal debulking of 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  After these problems resolved she received 2 additional cycles of single-agent carboplatin completed in March 2011  (2)  She had her first recurrence February 2012  treated with carboplatin and Gemzar for a total of 6 cycles between February and June 2012.  Her CEA-125 had normalized before the beginning of cycle 5.   (3) second recurrence documented February 2013, being treated with carboplatin/doxil, with evidence of response and good tolerance  PLAN:  She will receive her sixth and final cycle today. She will see [place] again August 12, just to make sure she is doing well, and then on August 26 she'll have restaging studies. She will see Dr. Nedra Hai at [facility] on August 30, and will return to see me the first week in September. At that point I expect that we will initiate a close followup routine. Juliett is hoping to get back to her own home, and possibly sometime in September, if she is not to receive any further treatments, we can operationalize that.  Torrin Frein C    10/03/2011

## 2011-10-03 NOTE — Patient Instructions (Addendum)
[facility] Discharge Instructions for Patients Receiving Chemotherapy  Today you received the following chemotherapy agents Doxil and Carboplatin. To help prevent nausea and vomiting after your treatment, we encourage you to take your nausea medication  As per Dr. Darnelle Catalan.  If you develop nausea and vomiting that is not controlled by your nausea medication, call the clinic. If it is after clinic hours your family physician or the after hours number for the clinic or go to the Emergency Department.   BELOW ARE SYMPTOMS THAT SHOULD BE REPORTED IMMEDIATELY:  *FEVER GREATER THAN 100.5 F  *CHILLS WITH OR WITHOUT FEVER  NAUSEA AND VOMITING THAT IS NOT CONTROLLED WITH YOUR NAUSEA MEDICATION  *UNUSUAL SHORTNESS OF BREATH  *UNUSUAL BRUISING OR BLEEDING  TENDERNESS IN MOUTH AND THROAT WITH OR WITHOUT PRESENCE OF ULCERS  *URINARY PROBLEMS  *BOWEL PROBLEMS  UNUSUAL RASH Items with * indicate a potential emergency and should be followed up as soon as possible.  . Feel free to call the clinic you have any questions or concerns. The clinic phone number is [phone].   I have been informed and understand all the instructions given to me. I know to contact the clinic, my physician, or go to the Emergency Department if any problems should occur. I do not have any questions at this time, but understand that I may call the clinic during office hours   should I have any questions or need assistance in obtaining follow up care.    __________________________________________  _____________  __________ Signature of Patient or Authorized Representative            Date                   Time    __________________________________________ Nurse's Signature

## 2011-10-04 ENCOUNTER — Ambulatory Visit (HOSPITAL_BASED_OUTPATIENT_CLINIC_OR_DEPARTMENT_OTHER): Payer: Medicare Other

## 2011-10-04 VITALS — BP 127/71 | HR 68 | Temp 98.1°F

## 2011-10-04 DIAGNOSIS — Z5111 Encounter for antineoplastic chemotherapy: Secondary | ICD-10-CM

## 2011-10-04 DIAGNOSIS — C482 Malignant neoplasm of peritoneum, unspecified: Secondary | ICD-10-CM

## 2011-10-04 DIAGNOSIS — C569 Malignant neoplasm of unspecified ovary: Secondary | ICD-10-CM

## 2011-10-04 MED ORDER — SODIUM CHLORIDE 0.9 % IV SOLN
INTRAVENOUS | Status: AC
  Administered 2011-10-04: 12:00:00 via INTRAVENOUS

## 2011-10-05 ENCOUNTER — Other Ambulatory Visit: Payer: Self-pay | Admitting: Oncology

## 2011-10-05 ENCOUNTER — Ambulatory Visit (HOSPITAL_BASED_OUTPATIENT_CLINIC_OR_DEPARTMENT_OTHER): Payer: Medicare Other

## 2011-10-05 VITALS — BP 147/75 | HR 63 | Temp 98.4°F

## 2011-10-05 DIAGNOSIS — C482 Malignant neoplasm of peritoneum, unspecified: Secondary | ICD-10-CM

## 2011-10-05 DIAGNOSIS — Z5111 Encounter for antineoplastic chemotherapy: Secondary | ICD-10-CM

## 2011-10-05 DIAGNOSIS — C569 Malignant neoplasm of unspecified ovary: Secondary | ICD-10-CM

## 2011-10-05 MED ORDER — SODIUM CHLORIDE 0.9 % IJ SOLN
10.0000 mL | INTRAMUSCULAR | Status: DC | PRN
  Administered 2011-10-05: 10 mL via INTRAVENOUS
  Filled 2011-10-05: qty 10

## 2011-10-05 MED ORDER — SODIUM CHLORIDE 0.9 % IV SOLN
INTRAVENOUS | Status: AC
  Administered 2011-10-05: 12:00:00 via INTRAVENOUS

## 2011-10-05 MED ORDER — HEPARIN SOD (PORK) LOCK FLUSH 100 UNIT/ML IV SOLN
500.0000 [IU] | Freq: Once | INTRAVENOUS | Status: AC
  Administered 2011-10-05: 500 [IU] via INTRAVENOUS
  Filled 2011-10-05: qty 5

## 2011-10-05 NOTE — Patient Instructions (Addendum)
[facility] Discharge Instructions for Patients Receiving Chemotherapy  Today you received the following Normal Saline  To help prevent nausea and vomiting after your treatment, we encourage you to take your nausea medication Begin taking it at 7 pm and take it as often as prescribed for the next 24 to 72 hours.   If you develop nausea and vomiting that is not controlled by your nausea medication, call the clinic. If it is after clinic hours your family physician or the after hours number for the clinic or go to the Emergency Department.   BELOW ARE SYMPTOMS THAT SHOULD BE REPORTED IMMEDIATELY:  *FEVER GREATER THAN 100.5 F  *CHILLS WITH OR WITHOUT FEVER  NAUSEA AND VOMITING THAT IS NOT CONTROLLED WITH YOUR NAUSEA MEDICATION  *UNUSUAL SHORTNESS OF BREATH  *UNUSUAL BRUISING OR BLEEDING  TENDERNESS IN MOUTH AND THROAT WITH OR WITHOUT PRESENCE OF ULCERS  *URINARY PROBLEMS  *BOWEL PROBLEMS  UNUSUAL RASH Items with * indicate a potential emergency and should be followed up as soon as possible.  One of the nurses will contact you 24 hours after your treatment. Please let the nurse know about any problems that you may have experienced. Feel free to call the clinic you have any questions or concerns. The clinic phone number is [phone].   I have been informed and understand all the instructions given to me. I know to contact the clinic, my physician, or go to the Emergency Department if any problems should occur. I do not have any questions at this time, but understand that I may call the clinic during office hours   should I have any questions or need assistance in obtaining follow up care.    __________________________________________  _____________  __________ Signature of Patient or Authorized Representative            Date                   Time    __________________________________________ Nurse's Signature    

## 2011-10-17 ENCOUNTER — Telehealth: Payer: Self-pay | Admitting: *Deleted

## 2011-10-17 ENCOUNTER — Ambulatory Visit (HOSPITAL_BASED_OUTPATIENT_CLINIC_OR_DEPARTMENT_OTHER): Payer: Medicare Other | Admitting: Physician Assistant

## 2011-10-17 ENCOUNTER — Other Ambulatory Visit (HOSPITAL_BASED_OUTPATIENT_CLINIC_OR_DEPARTMENT_OTHER): Payer: Medicare Other | Admitting: Lab

## 2011-10-17 ENCOUNTER — Encounter: Payer: Self-pay | Admitting: Physician Assistant

## 2011-10-17 VITALS — BP 133/75 | HR 73 | Temp 98.6°F | Resp 20 | Ht 66.0 in | Wt 144.1 lb

## 2011-10-17 DIAGNOSIS — C569 Malignant neoplasm of unspecified ovary: Secondary | ICD-10-CM

## 2011-10-17 DIAGNOSIS — D649 Anemia, unspecified: Secondary | ICD-10-CM

## 2011-10-17 DIAGNOSIS — Z452 Encounter for adjustment and management of vascular access device: Secondary | ICD-10-CM

## 2011-10-17 DIAGNOSIS — C482 Malignant neoplasm of peritoneum, unspecified: Secondary | ICD-10-CM

## 2011-10-17 DIAGNOSIS — N390 Urinary tract infection, site not specified: Secondary | ICD-10-CM

## 2011-10-17 LAB — URINALYSIS, MICROSCOPIC - CHCC
Bilirubin (Urine): NEGATIVE
Glucose: NEGATIVE g/dL
Ketones: NEGATIVE mg/dL
Protein: <30 mg/dL
RBC / HPF: NEGATIVE (ref 0–2)

## 2011-10-17 LAB — COMPREHENSIVE METABOLIC PANEL
Albumin: 3.7 g/dL (ref 3.5–5.2)
BUN: 25 mg/dL — ABNORMAL HIGH (ref 6–23)
CO2: 27 mEq/L (ref 19–32)
Calcium: 9.8 mg/dL (ref 8.4–10.5)
Chloride: 100 mEq/L (ref 96–112)
Glucose, Bld: 154 mg/dL — ABNORMAL HIGH (ref 70–99)
Potassium: 3.9 mEq/L (ref 3.5–5.3)

## 2011-10-17 LAB — CBC WITH DIFFERENTIAL/PLATELET
Basophils Absolute: 0 10*3/uL (ref 0.0–0.1)
Eosinophils Absolute: 0 10*3/uL (ref 0.0–0.5)
HCT: 26.4 % — ABNORMAL LOW (ref 34.8–46.6)
HGB: 9.1 g/dL — ABNORMAL LOW (ref 11.6–15.9)
NEUT#: 1.5 10*3/uL (ref 1.5–6.5)
RDW: 15 % — ABNORMAL HIGH (ref 11.2–14.5)
lymph#: 0.6 10*3/uL — ABNORMAL LOW (ref 0.9–3.3)

## 2011-10-17 LAB — CA 125: CA 125: 11.9 U/mL (ref 0.0–30.2)

## 2011-10-17 NOTE — Progress Notes (Signed)
ID: Regina West   DOB: 1930/01/24  MR#: [id number]  WUJ#:[id number]  HISTORY OF PRESENT ILLNESS: The patient originally presented in the summer of 2010 with cramps and abdominal distension.  I do not have a copy of the initial evaluation, but on November 26, 2008, the patient underwent optimal debulking with bilateral salpingo-oophorectomy, omentectomy, and the placement of an intraperitoneal port.  The pathology from that procedure (Accession Number [id number] at [facility]) showed first of all, significant involvement of the omentum, minimal involvement of the right ovary and right fallopian tube and negative on the left ovary and fallopian tube, with neither ovary being enlarged, consistent with a primary peritoneal serous adenocarcinoma, described as moderately differentiated.  The sample included no lymph nodes.    The patient had an intraperitoneal port placed in the same surgery and was treated with intraperitoneal and IV chemotherapy according to GOG-252 but I am not sure which arm she was in.  (Arm 2 did carboplatin intraperitoneally and Taxol IV.  Arm 3 did cisplatin and paclitaxel intraperitoneally with paclitaxel IV.)  All arms received bevacizumab.  Unfortunately, after 4 cycles of treatment, she had acute mental status changes and was admitted here January of 2011 with what proved to be posterior reversible leukoencephalopathy and SIADH.  She had seizures, aphasia, and required intubation.  Once the patient recovered from this, she was treated with 2 cycles of single-agent carboplatin (I do not have the AUC). Her last adjuvant treatment was May 12, 2009, and her CA-125 at that time was 10.3.  Her intraperitoneal port was removed in April of 2011.    More recently, the patient was in routine follow up when her CA-125 was found to jump up to 2,269.7 (March 26, 2010).  This is 10 months after her last prior chemotherapy.  She had CTs of the chest, abdomen and pelvis January 26th which showed  ascites and enhancing peritoneal nodularity. She had had similar findings at presentation but these had completely resolved by the time she finished treatment in March of 2011. The patient was felt to be in first relapse. Subsequent treatments are as summarized below   INTERVAL HISTORY:  Regina West returns today with her daughter for followup of Regina West's ovarian cancer, currently day 15 cycle 6 of treatment with carboplatin/Doxil. Regina West tolerated this treatment extremely well, and interval history is generally unremarkable. She is trying to do more walking, and also has a set of stairs at [place] that she "practices on" to increase her stamina and strength.  REVIEW OF SYSTEMS:  Regina West denies any fevers or chills. She has a "bump" on her buttock she would like for me to look at. It does not hurt or itch. In fact she believes it is improving. She's had no additional skin changes or rashes. No nausea or emesis, no diarrhea or constipation. No signs of abnormal bleeding. No cough or phlegm production. No orthopnea. She does have some shortness of breath with exertion which is not new. No peripheral swelling. No new or unusual myalgias, arthralgias, abnormal headaches, or abdominal/pelvic pain.  A detailed review of systems is otherwise stable and noncontributory.   PAST MEDICAL HISTORY: Significant for hysterectomy at the age of 58 with concurrent bladder "tuck up."  She also underwent appendectomy during that procedure.  She is status post prior bilateral cataract surgery. She is status post tonsillectomy and adenoidectomy.  She has a history of hypertension and diabetes. She has a history of diverticulosis.  She has a large hiatal hernia.  She has  degenerative disk disease.  She has been noted to have coronary calcifications and she has hyperlipidemia.  There is a history of remote tobacco abuse. Seizure disorder as per HPI  FAMILY HISTORY The patient's father died at the age of 42 after heart surgery  for aortic stenosis.  The patient's mother died at the age of 1.  The patient has one brother in good health.  There is no other family history to her knowledge of ovarian or breast cancer.    GYNECOLOGIC HISTORY: Menarche at age 10.  She is GX P3 with menopause in her late 36s.  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  Her husband was in sales and had a history of Parkinson's disease.  He died following a fall in the year 2000.  The patient's son Regina West lives in [place], and has a history of MS.  Daughter Regina West lives in [place]. She is a Futures trader.  Son Regina West lives in [place], [place] and is in Airline pilot. The patient has 8 grandchildren. She attends the [employer].  She is a good friend of our patients [name]   ADVANCED DIRECTIVES: in place  HEALTH MAINTENANCE: History  Substance Use Topics  . Smoking status: Former Smoker    Quit date: 05/24/1959  . Smokeless tobacco: Never Used  . Alcohol Use: No     Colonoscopy: 2008  PAP: UTD  MM: refuses  Allergies  Allergen Reactions  . Avastin (Bevacizumab) Other (See Comments)    Swelling of the brain     Current Outpatient Prescriptions  Medication Sig Dispense Refill  . acyclovir (ZOVIRAX) 400 MG tablet Take 400 mg by mouth 2 (two) times daily.      Marland Kitchen antiseptic oral rinse (BIOTENE) LIQD 15 mLs by Mouth Rinse route 2 (two) times daily.  1 Bottle  0  . cholecalciferol (VITAMIN D) 1000 UNITS tablet Take 1,000 Units by mouth 2 (two) times daily.      Marland Kitchen Epinastine HCl 0.05 % ophthalmic solution       . escitalopram (LEXAPRO) 10 MG tablet       . levETIRAcetam (KEPPRA) 500 MG tablet Take 250 mg by mouth every 12 (twelve) hours. Pt takes 1/2 tab for 250 mg dose      . lidocaine-prilocaine (EMLA) cream Apply 1 application topically as needed.      Marland Kitchen LOTEMAX 0.5 % OINT       . metoCLOPramide (REGLAN)  5 MG tablet Take 1 tablet (5 mg total) by mouth 3 (three) times daily before meals.  90 tablet  2  . mirtazapine (REMERON SOL-TAB) 15 MG disintegrating tablet Take 1 tablet (15 mg total) by mouth at bedtime.  30 tablet  3  . neomycin-polymyxin b-dexamethasone (MAXITROL) 3.5-10000-0.1 OINT       . omeprazole (PRILOSEC) 40 MG capsule Take 1 capsule (40 mg total) by mouth at bedtime.  30 capsule  3  . ondansetron (ZOFRAN-ODT) 8 MG disintegrating tablet Take 1 tablet (8 mg total) by mouth every 8 (eight) hours as needed. nausea  20 tablet  3  . PRESCRIPTION MEDICATION Pt gets [facility]. Last treatment was on 05-16-11 followed by Dr Bevelyn Buckles      . prochlorperazine (COMPAZINE) 10 MG tablet Take 1 tablet (10 mg total) by mouth every 6 (six) hours as needed.  30 tablet  0  .  prochlorperazine (COMPAZINE) 10 MG tablet         OBJECTIVE: Elderly white woman who appears well and in no acute distress. In Filed Vitals:   10/17/11 1137  BP: 133/75  Pulse: 73  Temp: 98.6 F (37 C)  Resp: 20     Body mass index is 23.26 kg/(m^2).    ECOG FS: 1 Filed Weights   10/17/11 1137  Weight: 144 lb 1.6 oz (65.363 kg)   Physical Exam: HEENT:  Sclerae anicteric.  Oropharynx clear.  No mucositis or candidiasis.   Nodes:  No cervical, supraclavicular, or axillary lymphadenopathy palpated.  Breast Exam:  Deferred Lungs:  Clear to auscultation bilaterally.  No crackles, rhonchi, or wheezes.   Heart:  Regular rate and rhythm.   Abdomen:  Soft, mildly distended, nontender.  Positive bowel sounds.  No organomegaly or masses palpated.   Musculoskeletal:  No focal spinal tenderness to palpation.  Extremities:  No peripheral edema or cyanosis.   Skin:  There is a small red bump on the right buttock which appears to be healing. No additional rashes are noted. No evidence of skin breakdown, and skin is otherwise benign.  Neuro:  Nonfocal. Alert and oriented x3.    LAB RESULTS:   Lab Results  Component Value Date     CA125 13.8 09/21/2011   Lab Results  Component Value Date   WBC 2.2* 10/17/2011   NEUTROABS 1.5 10/17/2011   HGB 9.1* 10/17/2011   HCT 26.4* 10/17/2011   MCV 111.6* 10/17/2011   PLT 43* 10/17/2011      Chemistry      Component Value Date/Time   NA 137 10/03/2011 1048   NA 143 05/12/2011 1337   K 4.4 10/03/2011 1048   K 4.1 05/12/2011 1337   CL 100 10/03/2011 1048   CL 92* 05/12/2011 1337   CO2 26 10/03/2011 1048   CO2 32 05/12/2011 1337   BUN 23 10/03/2011 1048   BUN 19 05/12/2011 1337   CREATININE 1.10 10/03/2011 1048   CREATININE 1.3* 05/12/2011 1337      Component Value Date/Time   CALCIUM 9.7 10/03/2011 1048   CALCIUM 9.8 05/12/2011 1337   ALKPHOS 85 10/03/2011 1048   ALKPHOS 95* 05/12/2011 1337   AST 22 10/03/2011 1048   AST 40* 05/12/2011 1337   ALT 13 10/03/2011 1048   BILITOT 0.4 10/03/2011 1048   BILITOT 0.60 05/12/2011 1337      STUDIES:  [name]* *[facility]* [address] [place], [place] [address] [phone]  ------------------------------------------------------------ Transthoracic Echocardiography  Patient: Amonda, Brillhart MR #: [id number] Study Date: 09/29/2011 Gender: F Age: 78 Height: 170.2cm Weight: 65.3kg BSA: 1.32m^2 Pt. Status: Room:  PERFORMING [facility] ATTENDING Magrinat, Cicero Duck REFERRING Jacky Kindle, Richard Artist Pais, Alquan Morrish SONOGRAPHER Emelia Loron, RDCS cc:  ------------------------------------------------------------ LV EF: 60% - 65%  ------------------------------------------------------------ History: PMH: Folllow-up monitoring for chemotherapy. Ovarian cancer.  ------------------------------------------------------------ Study Conclusions  - Left ventricle: The cavity size was normal. Wall thickness was increased in a pattern of mild LVH. Systolic function was normal. The estimated ejection fraction was in the range of 60% to 65%. Wall motion was normal; there were no regional  wall motion abnormalities. Indeterminant diastolic function. - Aortic valve: There was no stenosis. - Mitral valve: No significant regurgitation. - Left atrium: The atrium was mildly dilated. - Right ventricle: The cavity size was normal. Systolic function was normal. - Pulmonary arteries: No complete TR doppler jet so unable to estimate  PA systolic pressure. - Inferior vena cava: The vessel was normal in size; the respirophasic diameter changes were in the normal range (= 50%); findings are consistent with normal central venous pressure. Impressions:  - Normal LV size and systolic function, EF 60-65%. Mild LV hypertrophy. Normal RV size and systolic function    ASSESSMENT: 78 y.o.  [place] woman   (1) status post optimal debulking of a primary peritoneal serous adenocarcinoma September 2010, the tumor being moderately differentiated, pT3c NX (stage IIIC), treated according to GOG 252 with  intraperitoneal platinum and paclitaxel x4 given along with bevacizumab, complicated by SIADHafter cycle 4, also with posterior reversible leukoencephalopathy.  After these problems resolved she received 2 additional cycles of single-agent carboplatin completed in March 2011  (2)  She had her first recurrence February 2012  treated with carboplatin and Gemzar for a total of 6 cycles between February and June 2012.  Her CEA-125 had normalized before the beginning of cycle 5.   (3) second recurrence documented February 2013,  treated with carboplatin/doxil x 6, with evidence of response and good tolerance  PLAN:  Blossom will be returning to followup with Dr. Darnelle Catalan on September 5. Prior to that appointment, we will repeat her labs and scans on August 26, and she will see Dr. Nedra Hai at [facility] on August 29. At that point we expect that we will initiate a close followup routine. She voices her understanding and agreement with this plan, and will call with any changes or problems.   Ahmaud Duthie     10/17/2011

## 2011-10-17 NOTE — Telephone Encounter (Signed)
Gave patient appointment for 10-31-2011 add on lab before the patient gets her scans

## 2011-10-18 ENCOUNTER — Other Ambulatory Visit: Payer: Self-pay | Admitting: *Deleted

## 2011-10-18 DIAGNOSIS — C569 Malignant neoplasm of unspecified ovary: Secondary | ICD-10-CM

## 2011-10-18 DIAGNOSIS — K219 Gastro-esophageal reflux disease without esophagitis: Secondary | ICD-10-CM

## 2011-10-18 MED ORDER — OMEPRAZOLE 40 MG PO CPDR: 40.0000 mg | DELAYED_RELEASE_CAPSULE | Freq: Every day | ORAL | Status: DC

## 2011-10-19 LAB — URINE CULTURE

## 2011-10-21 ENCOUNTER — Other Ambulatory Visit: Payer: Self-pay | Admitting: *Deleted

## 2011-10-21 DIAGNOSIS — N390 Urinary tract infection, site not specified: Secondary | ICD-10-CM | POA: Insufficient documentation

## 2011-10-26 ENCOUNTER — Other Ambulatory Visit: Payer: Self-pay | Admitting: Physician Assistant

## 2011-10-26 ENCOUNTER — Other Ambulatory Visit: Payer: Self-pay | Admitting: Emergency Medicine

## 2011-10-26 DIAGNOSIS — N390 Urinary tract infection, site not specified: Secondary | ICD-10-CM

## 2011-10-26 MED ORDER — CIPROFLOXACIN HCL 500 MG PO TABS: 500.0000 mg | ORAL_TABLET | Freq: Two times a day (BID) | ORAL | Status: AC

## 2011-10-26 NOTE — Telephone Encounter (Signed)
Spoke with patient, she is currently taking Cipro 500mg  BID x 7 days which she filled on 8/16.   Patient denies any problems at this time.   Encouraged patient to keep future appointments and call for any concerns.   Patient inquired about sending results of CT scan to [facility] Dr Conley Rolls before her scheduled appointment on 9/30.

## 2011-10-31 ENCOUNTER — Other Ambulatory Visit: Payer: Medicare Other | Admitting: Lab

## 2011-10-31 ENCOUNTER — Other Ambulatory Visit (HOSPITAL_BASED_OUTPATIENT_CLINIC_OR_DEPARTMENT_OTHER): Payer: Medicare Other

## 2011-10-31 ENCOUNTER — Ambulatory Visit (HOSPITAL_COMMUNITY)
Admission: RE | Admit: 2011-10-31 | Discharge: 2011-10-31 | Disposition: A | Discharge status: Home / Self Care | Payer: Medicare Other | Source: Ambulatory Visit | Attending: Oncology | Admitting: Oncology

## 2011-10-31 ENCOUNTER — Other Ambulatory Visit: Payer: Self-pay | Admitting: Oncology

## 2011-10-31 DIAGNOSIS — C569 Malignant neoplasm of unspecified ovary: Secondary | ICD-10-CM

## 2011-10-31 DIAGNOSIS — K5730 Diverticulosis of large intestine without perforation or abscess without bleeding: Secondary | ICD-10-CM | POA: Insufficient documentation

## 2011-10-31 DIAGNOSIS — N390 Urinary tract infection, site not specified: Secondary | ICD-10-CM

## 2011-10-31 DIAGNOSIS — Z90710 Acquired absence of both cervix and uterus: Secondary | ICD-10-CM | POA: Insufficient documentation

## 2011-10-31 DIAGNOSIS — K449 Diaphragmatic hernia without obstruction or gangrene: Secondary | ICD-10-CM | POA: Insufficient documentation

## 2011-10-31 DIAGNOSIS — I517 Cardiomegaly: Secondary | ICD-10-CM | POA: Insufficient documentation

## 2011-10-31 LAB — COMPREHENSIVE METABOLIC PANEL (CC13)
ALT: 13 U/L (ref 0–55)
Albumin: 3.5 g/dL (ref 3.5–5.0)
CO2: 26 mEq/L (ref 22–29)
Glucose: 89 mg/dl (ref 70–99)
Potassium: 4.3 mEq/L (ref 3.5–5.1)
Sodium: 132 mEq/L — ABNORMAL LOW (ref 136–145)
Total Bilirubin: 0.5 mg/dL (ref 0.20–1.20)

## 2011-10-31 LAB — URINALYSIS, MICROSCOPIC - CHCC
Bilirubin (Urine): NEGATIVE
Leukocyte Esterase: NEGATIVE
Protein: NEGATIVE mg/dL
pH: 6 (ref 4.6–8.0)

## 2011-10-31 LAB — CBC WITH DIFFERENTIAL/PLATELET
Basophils Absolute: 0 10*3/uL (ref 0.0–0.1)
Eosinophils Absolute: 0 10*3/uL (ref 0.0–0.5)
HCT: 24.5 % — ABNORMAL LOW (ref 34.8–46.6)
HGB: 8.6 g/dL — ABNORMAL LOW (ref 11.6–15.9)
MCV: 112.5 fL — ABNORMAL HIGH (ref 79.5–101.0)
MONO%: 26.4 % — ABNORMAL HIGH (ref 0.0–14.0)
NEUT#: 1 10*3/uL — ABNORMAL LOW (ref 1.5–6.5)
NEUT%: 50.2 % (ref 38.4–76.8)
Platelets: 84 10*3/uL — ABNORMAL LOW (ref 145–400)
RDW: 16.1 % — ABNORMAL HIGH (ref 11.2–14.5)

## 2011-10-31 LAB — CA 125: CA 125: 11 U/mL (ref 0.0–30.2)

## 2011-10-31 MED ORDER — IOHEXOL 300 MG/ML  SOLN
100.0000 mL | Freq: Once | INTRAMUSCULAR | Status: AC | PRN
  Administered 2011-10-31: 100 mL via INTRAVENOUS

## 2011-11-04 ENCOUNTER — Telehealth: Payer: Self-pay | Admitting: *Deleted

## 2011-11-04 NOTE — Telephone Encounter (Signed)
Per request from Lupita Leash from [facility], last office note, labs, and ct scan have been faxed to [phone]

## 2011-11-08 ENCOUNTER — Other Ambulatory Visit: Payer: Self-pay | Admitting: *Deleted

## 2011-11-08 MED ORDER — DOXYCYCLINE HYCLATE 100 MG PO TABS: 100.0000 mg | ORAL_TABLET | ORAL | Status: AC

## 2011-11-08 NOTE — Telephone Encounter (Signed)
Per MD review of urine culture- request given for pt to be on doxycycline.  This RN called to pt's home number and was directed to a nonidentified VM .- general message left requesting a return call.  Call then placed to pt's dtr in law [name] - and obtained an identified VM-  Message left stating need for pt to be on ABX-and that prescription was sent to pharmacy.  Requested return call to verify above plan of care.

## 2011-11-10 ENCOUNTER — Ambulatory Visit (HOSPITAL_BASED_OUTPATIENT_CLINIC_OR_DEPARTMENT_OTHER): Payer: Medicare Other | Admitting: Oncology

## 2011-11-10 VITALS — BP 119/72 | HR 76 | Temp 97.7°F | Resp 20 | Ht 66.0 in | Wt 146.9 lb

## 2011-11-10 DIAGNOSIS — C569 Malignant neoplasm of unspecified ovary: Secondary | ICD-10-CM

## 2011-11-10 DIAGNOSIS — C482 Malignant neoplasm of peritoneum, unspecified: Secondary | ICD-10-CM

## 2011-11-10 NOTE — Progress Notes (Signed)
ID: Regina West   DOB: 03/30/1929  MR#: [id number]  YNW#:[id number]  HISTORY OF PRESENT ILLNESS: The patient originally presented in the summer of 2010 with cramps and abdominal distension.  I do not have a copy of the initial evaluation, but on November 26, 2008, the patient underwent optimal debulking with bilateral salpingo-oophorectomy, omentectomy, and the placement of an intraperitoneal port.  The pathology from that procedure (Accession Number [id number] at [facility]) showed first of all, significant involvement of the omentum, minimal involvement of the right ovary and right fallopian tube and negative on the left ovary and fallopian tube, with neither ovary being enlarged, consistent with a primary peritoneal serous adenocarcinoma, described as moderately differentiated.  The sample included no lymph nodes.    The patient had an intraperitoneal port placed in the same surgery and was treated with intraperitoneal and IV chemotherapy according to GOG-252 but I am not sure which arm she was in.  (Arm 2 did carboplatin intraperitoneally and Taxol IV.  Arm 3 did cisplatin and paclitaxel intraperitoneally with paclitaxel IV.)  All arms received bevacizumab.  Unfortunately, after 4 cycles of treatment, she had acute mental status changes and was admitted here January of 2011 with what proved to be posterior reversible leukoencephalopathy and SIADH.  She had seizures, aphasia, and required intubation.  Once the patient recovered from this, she was treated with 2 cycles of single-agent carboplatin (I do not have the AUC). Her last adjuvant treatment was May 12, 2009, and her CA-125 at that time was 10.3.  Her intraperitoneal port was removed in April of 2011.    More recently, the patient was in routine follow up when her CA-125 was found to jump up to 2,269.7 (March 26, 2010).  This is 10 months after her last prior chemotherapy.  She had CTs of the chest, abdomen and pelvis January 26th which showed  ascites and enhancing peritoneal nodularity. She had had similar findings at presentation but these had completely resolved by the time she finished treatment in March of 2011. The patient was felt to be in first relapse. Subsequent treatments are as summarized below   INTERVAL HISTORY:  Regina West returns today with her daughter-in-law Regina West for followup of Regina West's ovarian cancer. She completed her chemotherapy at the end of July, and last week she met with Dr. Nedra Hai at [facility], with plans as noted below.  REVIEW OF SYSTEMS:  Regina West has pretty much salt the constipation problem by taking a Senokot daily. She has no urinary symptoms, although she has chronic stress incontinence, which has not changed. She still has some peeling and tightness in her hands, over the palmar surfaces. Otherwise a detailed review of systems today was noncontributory.  PAST MEDICAL HISTORY: Significant for hysterectomy at the age of 72 with concurrent bladder "tuck up."  She also underwent appendectomy during that procedure.  She is status post prior bilateral cataract surgery. She is status post tonsillectomy and adenoidectomy.  She has a history of hypertension and diabetes. She has a history of diverticulosis.  She has a large hiatal hernia.  She has degenerative disk disease.  She has been noted to have coronary calcifications and she has hyperlipidemia.  There is a history of remote tobacco abuse. Seizure disorder as per HPI  FAMILY HISTORY The patient's father died at the age of 67 after heart surgery for aortic stenosis.  The patient's mother died at the age of 43.  The patient has one brother in good health.  There is no other  family history to her knowledge of ovarian or breast cancer.    GYNECOLOGIC HISTORY: Menarche at age 56.  She is GX P3 with menopause in her late 57s.  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  Her husband was in sales and had a history of Parkinson's disease.  He died following a fall in the year 2000.  The patient's son Regina West lives in [place], and has a history of MS.  Daughter Regina West lives in [place]. She is a Futures trader.  Son Regina West lives in [place], [place] and is in Airline pilot. The patient has 8 grandchildren. She attends the [employer].  She is a good friend of our patients [name]   ADVANCED DIRECTIVES: in place  HEALTH MAINTENANCE: History  Substance Use Topics  . Smoking status: Former Smoker    Quit date: 05/24/1959  . Smokeless tobacco: Never Used  . Alcohol Use: No     Colonoscopy: 2008  PAP: UTD  MM: refuses  Allergies  Allergen Reactions  . Avastin (Bevacizumab) Other (See Comments)    Swelling of the brain     Current Outpatient Prescriptions  Medication Sig Dispense Refill  . acyclovir (ZOVIRAX) 400 MG tablet Take 400 mg by mouth 2 (two) times daily.      Marland Kitchen antiseptic oral rinse (BIOTENE) LIQD 15 mLs by Mouth Rinse route 2 (two) times daily.  1 Bottle  0  . cholecalciferol (VITAMIN D) 1000 UNITS tablet Take 1,000 Units by mouth 2 (two) times daily.      Marland Kitchen doxycycline (VIBRA-TABS) 100 MG tablet Take 1 tablet (100 mg total) by mouth as directed. Take one tablet bid x 7 days then 1 po daily  30 tablet  1  . Epinastine HCl 0.05 % ophthalmic solution       . escitalopram (LEXAPRO) 10 MG tablet       . levETIRAcetam (KEPPRA) 500 MG tablet Take 250 mg by mouth every 12 (twelve) hours. Pt takes 1/2 tab for 250 mg dose      . lidocaine-prilocaine (EMLA) cream Apply 1 application topically as needed.      . metoCLOPramide (REGLAN) 5 MG tablet Take 1 tablet (5 mg total) by mouth 3 (three) times daily before meals.  90 tablet  2  . neomycin-polymyxin b-dexamethasone (MAXITROL) 3.5-10000-0.1 OINT       . omeprazole (PRILOSEC) 40 MG capsule Take 1 capsule (40 mg total) by mouth at bedtime.  30 capsule  1  . ondansetron  (ZOFRAN-ODT) 8 MG disintegrating tablet Take 1 tablet (8 mg total) by mouth every 8 (eight) hours as needed. nausea  20 tablet  3  . LOTEMAX 0.5 % OINT       . mirtazapine (REMERON SOL-TAB) 15 MG disintegrating tablet Take 1 tablet (15 mg total) by mouth at bedtime.  30 tablet  3  . prochlorperazine (COMPAZINE) 10 MG tablet Take 1 tablet (10 mg total) by mouth every 6 (six) hours as needed.  30 tablet  0  . prochlorperazine (COMPAZINE) 10 MG tablet         OBJECTIVE: Elderly white woman who appears well   Filed Vitals:   11/10/11 1018  BP: 119/72  Pulse: 76  Temp: 97.7 F (36.5 C)  Resp: 20     Body mass index is 23.71 kg/(m^2).    ECOG FS: 1 Filed Weights  11/10/11 1018  Weight: 146 lb 14.4 oz (66.633 kg)   Physical Exam: HEENT:  Sclerae anicteric.  Oropharynx clear.   Nodes:  No cervical, supraclavicular, or axillary lymphadenopathy   Breast Exam:  Deferred Lungs:  Clear to auscultation bilaterally.  Heart:  Regular rate and rhythm.   Abdomen:  Soft, nontender.  Positive bowel sounds.  No organomegaly or masses palpated.   Musculoskeletal:  No focal spinal tenderness  Extremities:  No peripheral edema    Neuro:  Nonfocal.   LAB RESULTS:   Lab Results  Component Value Date   CA125 11.0 10/31/2011   Lab Results  Component Value Date   WBC 2.0* 10/31/2011   NEUTROABS 1.0* 10/31/2011   HGB 8.6* 10/31/2011   HCT 24.5* 10/31/2011   MCV 112.5* 10/31/2011   PLT 84* 10/31/2011      Chemistry      Component Value Date/Time   NA 132* 10/31/2011 1130   NA 136 10/17/2011 1118   NA 143 05/12/2011 1337   K 4.3 10/31/2011 1130   K 3.9 10/17/2011 1118   K 4.1 05/12/2011 1337   CL 97* 10/31/2011 1130   CL 100 10/17/2011 1118   CL 92* 05/12/2011 1337   CO2 26 10/31/2011 1130   CO2 27 10/17/2011 1118   CO2 32 05/12/2011 1337   BUN 19.0 10/31/2011 1130   BUN 25* 10/17/2011 1118   BUN 19 05/12/2011 1337   CREATININE 1.2* 10/31/2011 1130   CREATININE 1.10 10/17/2011 1118   CREATININE 1.3*  05/12/2011 1337      Component Value Date/Time   CALCIUM 9.4 10/31/2011 1130   CALCIUM 9.8 10/17/2011 1118   CALCIUM 9.8 05/12/2011 1337   ALKPHOS 106 10/31/2011 1130   ALKPHOS 109 10/17/2011 1118   ALKPHOS 95* 05/12/2011 1337   AST 23 10/31/2011 1130   AST 22 10/17/2011 1118   AST 40* 05/12/2011 1337   ALT 13 10/31/2011 1130   ALT 14 10/17/2011 1118   BILITOT 0.50 10/31/2011 1130   BILITOT 0.4 10/17/2011 1118   BILITOT 0.60 05/12/2011 1337      STUDIES: Dg Chest 2 View  10/31/2011  *RADIOLOGY REPORT*  Clinical Data: History of ovarian cancer  CHEST - 2 VIEW  Comparison: 06/06/2011  Findings: There is a right chest wall porta-catheter with tip in the projection of the cavoatrial junction.  The heart size appears normal.  Moderate size hiatal hernia is stable from previous exam.  Scar versus plate-like atelectasis noted in the left base.  No pleural effusions or edema.  No airspace consolidation.  IMPRESSION:  1.  No evidence for metastatic disease. 2.  Left base scar versus atelectasis.   Original Report Authenticated By: Rosealee Albee, M.D.    Ct Abdomen Pelvis W Contrast  10/31/2011  *RADIOLOGY REPORT*  Clinical Data: Ovarian cancer.  Restaging.  Ongoing chemotherapy. Hysterectomy.  Appendectomy.  CT ABDOMEN AND PELVIS WITH CONTRAST  Technique:  Multidetector CT imaging of the abdomen and pelvis was performed following the standard protocol during bolus administration of intravenous contrast.  Contrast: OMNIPAQUE IOHEXOL 300 MG/ML  SOLN  Comparison: 07/25/2011.  Findings: Clear lung bases.  Mild cardiomegaly.  Moderate hiatal hernia. No pericardial or pleural effusion.  The capsular based low density hepatic focus is no longer identified.  No parenchymal liver lesions.  Normal spleen.  Gastric body appears thick-walled on image 28, favored to be due to underdistension.  Normal pancreas, gallbladder, biliary tract, adrenal glands, kidneys.  Small retroperitoneal nodes are  stable.  There is a focus of  soft tissue density along the left periaortic region at the level of the left hemidiaphragm.  This measures 1.0 cm on image 20 and is felt to be similar.  No retrocrural adenopathy.  Colonic stool burden suggests constipation.  Scattered colonic diverticula.  Normal terminal ileum.  Normal caliber of small bowel loops.  No ascites.  No omental nodularity.  Low density focus in the right hemi pelvis measures 2.3 x 1.1 cm on image 64 versus 3.8 x 2.0 cm on the prior exam.  No pelvic sidewall adenopathy.  Normal urinary bladder.  Hysterectomy. No adnexal mass or significant free fluid.  No acute osseous abnormality.  Marked thoracolumbar spondylosis with areas of likely secondary trace malalignment.  IMPRESSION:  1.  Resolution of capsular based low density perihepatic lesion, likely representing response to therapy. 2.  A low density focus in the right hemi pelvis is also slightly decreased likely represents response to therapy. 3.  No new or progressive disease identified. 4. Possible constipation. 5.  Similar large hiatal hernia. 6.  Soft tissue density along the left periaortic space at the level of the left hemidiaphragm is similar and warrants followup attention.   Original Report Authenticated By: Consuello Bossier, M.D.     ASSESSMENT: 78 y.o.  [place] woman   (1) status post optimal debulking of a primary peritoneal serous adenocarcinoma September 2010, the tumor being moderately differentiated, pT3c NX (stage IIIC), treated according to GOG 252 with  intraperitoneal platinum and paclitaxel x4 given along with bevacizumab, complicated by SIADHafter cycle 4, also with posterior reversible leukoencephalopathy.  After these problems resolved she received 2 additional cycles of single-agent carboplatin completed in March 2011  (2)  She had her first recurrence February 2012  treated with carboplatin and Gemzar for a total of 6 cycles between February and June 2012.  Her CEA-125 had normalized before the  beginning of cycle 5.   (3) second recurrence documented February 2013,  treated with carboplatin/ doxil x 6, completed 10/03/2011, with a good response detailed in CT scans above.  PLAN:  Anabell is doing terrific, and doubtless will do even better once her counts more completely normalize. She is going to be seeing Dr. Nedra Hai again in 3 months. We will obtain lab work here before that visit so it is available to her for review. [place] we'll then see [place] 6 months from now, with lab work again before that visit. She was advised to call us for any problems with fever, rash, bleeding, or abdominal pain or distention, or she has difficulty with bowel movements.  MAGRINAT,GUSTAV C    11/10/2011

## 2011-11-14 ENCOUNTER — Other Ambulatory Visit (HOSPITAL_BASED_OUTPATIENT_CLINIC_OR_DEPARTMENT_OTHER): Payer: Medicare Other | Admitting: Lab

## 2011-11-14 DIAGNOSIS — C569 Malignant neoplasm of unspecified ovary: Secondary | ICD-10-CM

## 2011-11-14 LAB — CBC WITH DIFFERENTIAL/PLATELET
BASO%: 0.4 % (ref 0.0–2.0)
LYMPH%: 30.1 % (ref 14.0–49.7)
MCH: 39.2 pg — ABNORMAL HIGH (ref 25.1–34.0)
MCHC: 34.7 g/dL (ref 31.5–36.0)
MCV: 113.2 fL — ABNORMAL HIGH (ref 79.5–101.0)
MONO%: 23.7 % — ABNORMAL HIGH (ref 0.0–14.0)
Platelets: 142 10*3/uL — ABNORMAL LOW (ref 145–400)
RBC: 2.66 10*6/uL — ABNORMAL LOW (ref 3.70–5.45)

## 2011-11-14 LAB — COMPREHENSIVE METABOLIC PANEL (CC13)
ALT: 13 U/L (ref 0–55)
Alkaline Phosphatase: 108 U/L (ref 40–150)
Sodium: 135 mEq/L — ABNORMAL LOW (ref 136–145)
Total Bilirubin: 0.5 mg/dL (ref 0.20–1.20)
Total Protein: 6.2 g/dL — ABNORMAL LOW (ref 6.4–8.3)

## 2011-11-18 ENCOUNTER — Other Ambulatory Visit: Payer: Self-pay | Admitting: *Deleted

## 2011-11-18 DIAGNOSIS — C569 Malignant neoplasm of unspecified ovary: Secondary | ICD-10-CM

## 2011-11-18 MED ORDER — METOCLOPRAMIDE HCL 5 MG PO TABS: 5.0000 mg | ORAL_TABLET | Freq: Three times a day (TID) | ORAL | Status: DC

## 2011-11-18 NOTE — Telephone Encounter (Signed)
Called pharmacist.  Patient's reglan refill granted and in her profile for future refill when needed.

## 2011-11-28 ENCOUNTER — Other Ambulatory Visit (HOSPITAL_BASED_OUTPATIENT_CLINIC_OR_DEPARTMENT_OTHER): Payer: Medicare Other | Admitting: Lab

## 2011-11-28 DIAGNOSIS — C569 Malignant neoplasm of unspecified ovary: Secondary | ICD-10-CM

## 2011-11-28 LAB — CBC WITH DIFFERENTIAL/PLATELET
Eosinophils Absolute: 0.3 10*3/uL (ref 0.0–0.5)
HCT: 31.4 % — ABNORMAL LOW (ref 34.8–46.6)
LYMPH%: 20 % (ref 14.0–49.7)
MCHC: 34.4 g/dL (ref 31.5–36.0)
MCV: 112.2 fL — ABNORMAL HIGH (ref 79.5–101.0)
MONO%: 12.1 % (ref 0.0–14.0)
NEUT#: 2.2 10*3/uL (ref 1.5–6.5)
NEUT%: 58.1 % (ref 38.4–76.8)
Platelets: 115 10*3/uL — ABNORMAL LOW (ref 145–400)
RBC: 2.8 10*6/uL — ABNORMAL LOW (ref 3.70–5.45)

## 2011-12-13 ENCOUNTER — Telehealth: Payer: Self-pay | Admitting: *Deleted

## 2011-12-19 ENCOUNTER — Telehealth: Payer: Self-pay | Admitting: Oncology

## 2011-12-19 ENCOUNTER — Other Ambulatory Visit: Payer: Self-pay | Admitting: *Deleted

## 2011-12-19 NOTE — Telephone Encounter (Signed)
S/w the pt's dtr Regina West and she is aware of the lab appt on 01/16/2012. She is aware to pick up the rest of the appts at that time

## 2011-12-20 ENCOUNTER — Other Ambulatory Visit: Payer: Self-pay | Admitting: *Deleted

## 2011-12-20 DIAGNOSIS — K219 Gastro-esophageal reflux disease without esophagitis: Secondary | ICD-10-CM

## 2011-12-20 DIAGNOSIS — C569 Malignant neoplasm of unspecified ovary: Secondary | ICD-10-CM

## 2011-12-20 MED ORDER — OMEPRAZOLE 40 MG PO CPDR: 40.0000 mg | DELAYED_RELEASE_CAPSULE | Freq: Every day | ORAL | Status: DC

## 2012-01-13 ENCOUNTER — Other Ambulatory Visit: Payer: Self-pay | Admitting: *Deleted

## 2012-01-13 DIAGNOSIS — C569 Malignant neoplasm of unspecified ovary: Secondary | ICD-10-CM

## 2012-01-13 MED ORDER — METOCLOPRAMIDE HCL 5 MG PO TABS: 5.0000 mg | ORAL_TABLET | Freq: Three times a day (TID) | ORAL | Status: DC

## 2012-01-16 ENCOUNTER — Other Ambulatory Visit (HOSPITAL_BASED_OUTPATIENT_CLINIC_OR_DEPARTMENT_OTHER): Payer: Medicare Other | Admitting: Lab

## 2012-01-16 DIAGNOSIS — C569 Malignant neoplasm of unspecified ovary: Secondary | ICD-10-CM

## 2012-01-16 LAB — CBC WITH DIFFERENTIAL/PLATELET
BASO%: 0.2 % (ref 0.0–2.0)
EOS%: 4 % (ref 0.0–7.0)
LYMPH%: 22.7 % (ref 14.0–49.7)
MCH: 34.8 pg — ABNORMAL HIGH (ref 25.1–34.0)
MCHC: 32.8 g/dL (ref 31.5–36.0)
MONO#: 0.5 10*3/uL (ref 0.1–0.9)
MONO%: 12.5 % (ref 0.0–14.0)
NEUT%: 60.6 % (ref 38.4–76.8)
Platelets: 120 10*3/uL — ABNORMAL LOW (ref 145–400)
RBC: 3.59 10*6/uL — ABNORMAL LOW (ref 3.70–5.45)
WBC: 4.2 10*3/uL (ref 3.9–10.3)

## 2012-01-17 ENCOUNTER — Encounter: Payer: Self-pay | Admitting: *Deleted

## 2012-01-17 ENCOUNTER — Other Ambulatory Visit: Payer: Self-pay | Admitting: *Deleted

## 2012-01-17 ENCOUNTER — Other Ambulatory Visit: Payer: Medicare Other | Admitting: Lab

## 2012-01-17 DIAGNOSIS — C569 Malignant neoplasm of unspecified ovary: Secondary | ICD-10-CM

## 2012-01-17 NOTE — Telephone Encounter (Signed)
Old duplicate entry  

## 2012-01-18 ENCOUNTER — Telehealth: Payer: Self-pay | Admitting: Oncology

## 2012-01-18 NOTE — Telephone Encounter (Signed)
S/w the pt and she is aware of her lab appt in dec.

## 2012-02-06 ENCOUNTER — Other Ambulatory Visit (HOSPITAL_BASED_OUTPATIENT_CLINIC_OR_DEPARTMENT_OTHER): Payer: Medicare Other | Admitting: Lab

## 2012-02-06 DIAGNOSIS — C569 Malignant neoplasm of unspecified ovary: Secondary | ICD-10-CM

## 2012-02-06 LAB — CBC WITH DIFFERENTIAL/PLATELET
BASO%: 0.1 % (ref 0.0–2.0)
Basophils Absolute: 0 10*3/uL (ref 0.0–0.1)
HCT: 34.9 % (ref 34.8–46.6)
HGB: 11.9 g/dL (ref 11.6–15.9)
LYMPH%: 20.7 % (ref 14.0–49.7)
MCHC: 34.2 g/dL (ref 31.5–36.0)
MONO#: 0.5 10*3/uL (ref 0.1–0.9)
NEUT%: 62.3 % (ref 38.4–76.8)
Platelets: 120 10*3/uL — ABNORMAL LOW (ref 145–400)
WBC: 4.6 10*3/uL (ref 3.9–10.3)

## 2012-02-06 LAB — COMPREHENSIVE METABOLIC PANEL (CC13)
AST: 23 U/L (ref 5–34)
Albumin: 3.4 g/dL — ABNORMAL LOW (ref 3.5–5.0)
BUN: 20 mg/dL (ref 7.0–26.0)
CO2: 28 mEq/L (ref 22–29)
Chloride: 103 mEq/L (ref 98–107)
Creatinine: 1.2 mg/dL — ABNORMAL HIGH (ref 0.6–1.1)
Glucose: 108 mg/dl — ABNORMAL HIGH (ref 70–99)
Potassium: 4.4 mEq/L (ref 3.5–5.1)

## 2012-02-07 ENCOUNTER — Other Ambulatory Visit: Payer: Self-pay | Admitting: *Deleted

## 2012-02-07 ENCOUNTER — Other Ambulatory Visit (HOSPITAL_BASED_OUTPATIENT_CLINIC_OR_DEPARTMENT_OTHER): Payer: Medicare Other

## 2012-02-07 ENCOUNTER — Other Ambulatory Visit: Payer: Medicare Other | Admitting: Lab

## 2012-02-07 DIAGNOSIS — R309 Painful micturition, unspecified: Secondary | ICD-10-CM

## 2012-02-07 DIAGNOSIS — C569 Malignant neoplasm of unspecified ovary: Secondary | ICD-10-CM

## 2012-02-07 DIAGNOSIS — N390 Urinary tract infection, site not specified: Secondary | ICD-10-CM

## 2012-02-07 LAB — URINALYSIS, MICROSCOPIC - CHCC
Blood: NEGATIVE
Nitrite: NEGATIVE
Protein: NEGATIVE mg/dL
pH: 6 (ref 4.6–8.0)

## 2012-02-07 MED ORDER — NITROFURANTOIN MONOHYD MACRO 100 MG PO CAPS: 100.0000 mg | ORAL_CAPSULE | Freq: Every day | ORAL | Status: DC

## 2012-02-10 ENCOUNTER — Telehealth: Payer: Self-pay | Admitting: *Deleted

## 2012-02-10 NOTE — Telephone Encounter (Signed)
REQUESTING THE CA125 RESULTS FROM PT.'S LAST TWO VISITS. CA125 RESULTS OF 10/31/11 AND 02/06/12 WERE FAXED TO [phone].

## 2012-02-13 ENCOUNTER — Other Ambulatory Visit: Payer: Self-pay | Admitting: Emergency Medicine

## 2012-02-13 ENCOUNTER — Other Ambulatory Visit: Payer: Self-pay | Admitting: Oncology

## 2012-02-17 ENCOUNTER — Other Ambulatory Visit: Payer: Self-pay | Admitting: *Deleted

## 2012-02-17 DIAGNOSIS — C569 Malignant neoplasm of unspecified ovary: Secondary | ICD-10-CM

## 2012-02-17 MED ORDER — ACYCLOVIR 400 MG PO TABS: 400.0000 mg | ORAL_TABLET | Freq: Two times a day (BID) | ORAL | Status: DC

## 2012-02-24 ENCOUNTER — Ambulatory Visit (HOSPITAL_BASED_OUTPATIENT_CLINIC_OR_DEPARTMENT_OTHER): Payer: Medicare Other | Admitting: Oncology

## 2012-02-24 ENCOUNTER — Other Ambulatory Visit: Payer: Self-pay | Admitting: Oncology

## 2012-02-24 ENCOUNTER — Ambulatory Visit (HOSPITAL_COMMUNITY)
Admission: RE | Admit: 2012-02-24 | Discharge: 2012-02-24 | Disposition: A | Discharge status: Home / Self Care | Payer: Medicare Other | Source: Ambulatory Visit | Attending: Oncology | Admitting: Oncology

## 2012-02-24 ENCOUNTER — Telehealth: Payer: Self-pay | Admitting: *Deleted

## 2012-02-24 DIAGNOSIS — C569 Malignant neoplasm of unspecified ovary: Secondary | ICD-10-CM

## 2012-02-24 DIAGNOSIS — K449 Diaphragmatic hernia without obstruction or gangrene: Secondary | ICD-10-CM | POA: Insufficient documentation

## 2012-02-24 DIAGNOSIS — K5900 Constipation, unspecified: Secondary | ICD-10-CM | POA: Insufficient documentation

## 2012-02-24 DIAGNOSIS — I10 Essential (primary) hypertension: Secondary | ICD-10-CM | POA: Insufficient documentation

## 2012-02-24 DIAGNOSIS — R109 Unspecified abdominal pain: Secondary | ICD-10-CM | POA: Insufficient documentation

## 2012-02-24 NOTE — Telephone Encounter (Signed)
Regina West called to this RN to state concerns for " developing an impaction ".   She states good BM on Monday. Cramps and constipation then developed with pt having " very runny stool with rabbit pellets" on Thursday.  Regina West has used her routine interventions with minimial benefit.  Plan is for pt to obtain a KUB to evaluate for SBO at [facility] and then ask for this RN post for results and recommendations.

## 2012-02-26 NOTE — Progress Notes (Signed)
ID: Regina West   DOB: 1929-08-15  MR#: [id number]  XBJ#:[id number]  HISTORY OF PRESENT ILLNESS: The patient originally presented in the summer of 2010 with cramps and abdominal distension.  I do not have a copy of the initial evaluation, but on November 26, 2008, the patient underwent optimal debulking with bilateral salpingo-oophorectomy, omentectomy, and the placement of an intraperitoneal port.  The pathology from that procedure (Accession Number [id number] at [facility]) showed first of all, significant involvement of the omentum, minimal involvement of the right ovary and right fallopian tube and negative on the left ovary and fallopian tube, with neither ovary being enlarged, consistent with a primary peritoneal serous adenocarcinoma, described as moderately differentiated.  The sample included no lymph nodes.    The patient had an intraperitoneal port placed in the same surgery and was treated with intraperitoneal and IV chemotherapy according to GOG-252 but I am not sure which arm she was in.  (Arm 2 did carboplatin intraperitoneally and Taxol IV.  Arm 3 did cisplatin and paclitaxel intraperitoneally with paclitaxel IV.)  All arms received bevacizumab.  Unfortunately, after 4 cycles of treatment, she had acute mental status changes and was admitted here January of 2011 with what proved to be posterior reversible leukoencephalopathy and SIADH.  She had seizures, aphasia, and required intubation.  Once the patient recovered from this, she was treated with 2 cycles of single-agent carboplatin (I do not have the AUC). Her last adjuvant treatment was May 12, 2009, and her CA-125 at that time was 10.3.  Her intraperitoneal port was removed in April of 2011.    More recently, the patient was in routine follow up when her CA-125 was found to jump up to 2,269.7 (March 26, 2010).  This is 10 months after her last prior chemotherapy.  She had CTs of the chest, abdomen and pelvis January 26th which showed  ascites and enhancing peritoneal nodularity. She had had similar findings at presentation but these had completely resolved by the time she finished treatment in March of 2011. The patient was felt to be in first relapse. Subsequent treatments are as summarized below   INTERVAL HISTORY:  Regina West returns today with one of her daughters for followup of Regina West's ovarian cancer. This was not a scheduled visit. Instead she called today to state that she had been having abdominal pain now for several days  REVIEW OF SYSTEMS:  She states "not feeling well". She saw Dr. Nedra Hai at [facility] on December 6 and everything at that time was falling. On December 16 she started noticing a little bit of abdominal discomfort, and on the 17th she had diarrhea. On the next couple of days of she continued to have abdominal problems, slight distention, and more discomfort and pain. She is "eating things that shouldn't be eating". This morning she felt weak, dehydrated, with increasing abdominal pain. We set her up for a KUB prior to the visit, and this does show a few mildly dilated air-filled loops of small bowel in the midabdomen. Aside from all this a detailed review of systems today was noncontributory   PAST MEDICAL HISTORY: Significant for hysterectomy at the age of 40 with concurrent bladder "tuck up."  She also underwent appendectomy during that procedure.  She is status post prior bilateral cataract surgery. She is status post tonsillectomy and adenoidectomy.  She has a history of hypertension and diabetes. She has a history of diverticulosis.  She has a large hiatal hernia.  She has degenerative disk disease.  She  has been noted to have coronary calcifications and she has hyperlipidemia.  There is a history of remote tobacco abuse. Seizure disorder as per HPI  FAMILY HISTORY The patient's father died at the age of 42 after heart surgery for aortic stenosis.  The patient's mother died at the age of 33.  The patient has one  brother in good health.  There is no other family history to her knowledge of ovarian or breast cancer.    GYNECOLOGIC HISTORY: Menarche at age 45.  She is GX P3 with menopause in her late 23s.  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  Her husband was in sales and had a history of Parkinson's disease.  He died following a fall in the year 2000.  The patient's son Regina West lives in [place], and has a history of MS.  Daughter Regina West lives in [place]. She is a Futures trader.  Son Regina West lives in [place], [place] and is in Airline pilot. The patient has 8 grandchildren. She attends the [employer].  She is a good friend of our patients [name]   ADVANCED DIRECTIVES: in place  HEALTH MAINTENANCE: History  Substance Use Topics  . Smoking status: Former Smoker    Quit date: 05/24/1959  . Smokeless tobacco: Never Used  . Alcohol Use: No     Colonoscopy: 2008  PAP: UTD  MM: refuses  Allergies  Allergen Reactions  . Avastin (Bevacizumab) Other (See Comments)    Swelling of the brain     Current Outpatient Prescriptions  Medication Sig Dispense Refill  . acyclovir (ZOVIRAX) 400 MG tablet Take 1 tablet (400 mg total) by mouth 2 (two) times daily.  60 tablet  2  . antiseptic oral rinse (BIOTENE) LIQD 15 mLs by Mouth Rinse route 2 (two) times daily.  1 Bottle  0  . cholecalciferol (VITAMIN D) 1000 UNITS tablet Take 1,000 Units by mouth 2 (two) times daily.      Marland Kitchen Epinastine HCl 0.05 % ophthalmic solution       . escitalopram (LEXAPRO) 10 MG tablet       . levETIRAcetam (KEPPRA) 500 MG tablet Take 250 mg by mouth every 12 (twelve) hours. Pt takes 1/2 tab for 250 mg dose      . lidocaine-prilocaine (EMLA) cream Apply 1 application topically as needed.      Marland Kitchen LOTEMAX 0.5 % OINT       . metoCLOPramide (REGLAN) 5 MG tablet Take 1 tablet (5 mg total) by mouth 3 (three)  times daily before meals.  90 tablet  2  . mirtazapine (REMERON SOL-TAB) 15 MG disintegrating tablet Take 1 tablet (15 mg total) by mouth at bedtime.  30 tablet  3  . neomycin-polymyxin b-dexamethasone (MAXITROL) 3.5-10000-0.1 OINT       . nitrofurantoin, macrocrystal-monohydrate, (MACROBID) 100 MG capsule Take 1 capsule (100 mg total) by mouth daily.  5 capsule  0  . omeprazole (PRILOSEC) 40 MG capsule Take 1 capsule (40 mg total) by mouth at bedtime.  30 capsule  3  . ondansetron (ZOFRAN-ODT) 8 MG disintegrating tablet Take 1 tablet (8 mg total) by mouth every 8 (eight) hours as needed. nausea  20 tablet  3  . prochlorperazine (COMPAZINE) 10 MG tablet Take 1 tablet (10 mg total) by mouth every 6 (six) hours as needed.  30 tablet  0  . prochlorperazine (  COMPAZINE) 10 MG tablet         OBJECTIVE: Elderly white woman in no acute distress  There were no vitals filed for this visit.   There is no height or weight on file to calculate BMI.    ECOG FS: 1 There were no vitals filed for this visit.  Sclerae unicteric Oropharynx clear No cervical or supraclavicular adenopathy Lungs no rales or rhonchi Heart regular rate and rhythm Abd soft, moderately distended, positive bowel sounds, no masses palpated, no tenderness to palpation MSK no focal spinal tenderness, no peripheral edema Neuro: nonfocal Breasts: Deferred  LAB RESULTS: Lab Results  Component Value Date   CA125 18.6 02/06/2012   Lab Results  Component Value Date   WBC 4.6 02/06/2012   NEUTROABS 2.8 02/06/2012   HGB 11.9 02/06/2012   HCT 34.9 02/06/2012   MCV 105.3* 02/06/2012   PLT 120* 02/06/2012      Chemistry      Component Value Date/Time   NA 140 02/06/2012 1137   NA 136 10/17/2011 1118   NA 143 05/12/2011 1337   K 4.4 02/06/2012 1137   K 3.9 10/17/2011 1118   K 4.1 05/12/2011 1337   CL 103 02/06/2012 1137   CL 100 10/17/2011 1118   CL 92* 05/12/2011 1337   CO2 28 02/06/2012 1137   CO2 27 10/17/2011 1118   CO2 32 05/12/2011 1337    BUN 20.0 02/06/2012 1137   BUN 25* 10/17/2011 1118   BUN 19 05/12/2011 1337   CREATININE 1.2* 02/06/2012 1137   CREATININE 1.10 10/17/2011 1118   CREATININE 1.3* 05/12/2011 1337      Component Value Date/Time   CALCIUM 9.7 02/06/2012 1137   CALCIUM 9.8 10/17/2011 1118   CALCIUM 9.8 05/12/2011 1337   ALKPHOS 123 02/06/2012 1137   ALKPHOS 109 10/17/2011 1118   ALKPHOS 95* 05/12/2011 1337   AST 23 02/06/2012 1137   AST 22 10/17/2011 1118   AST 40* 05/12/2011 1337   ALT 16 02/06/2012 1137   ALT 14 10/17/2011 1118   BILITOT 0.47 02/06/2012 1137   BILITOT 0.4 10/17/2011 1118   BILITOT 0.60 05/12/2011 1337     CA 125 <20 February 06, 2012  STUDIES: Dg Abd 2 Views  02/24/2012  *RADIOLOGY REPORT*  Clinical Data: Abdominal discomfort, constipation, evaluate for obstruction, history hypertension, hiatal hernia, ovarian cancer  ABDOMEN - 2 VIEW  Comparison: 06/06/2011  Findings: Several mildly dilated small bowel loops seen in mid abdomen up to 3.6 cm diameter. Small amount of gas present in the proximal half of the colon. No definite bowel wall thickening or free intraperitoneal air. Large hiatal hernia. Bones diffusely demineralized with degenerative changes of the thoracolumbar spine. No definite urinary tract calcification. Lung bases clear.  IMPRESSION: Few mildly dilated air-filled loops of small bowel in the mid abdomen, could represent early or partial small bowel obstruction. Large hiatal hernia.   Original Report Authenticated By: Ulyses Southward, M.D.    ASSESSMENT: 78 y.o.  [place] woman   (1) status post optimal debulking of a primary peritoneal serous adenocarcinoma September 2010, the tumor being moderately differentiated, pT3c NX (stage IIIC), treated according to GOG 252 with  intraperitoneal platinum and paclitaxel x4 given along with bevacizumab, complicated by SIADHafter cycle 4, also with posterior reversible leukoencephalopathy.  After these problems resolved she received 2 additional cycles of  single-agent carboplatin completed in March 2011  (2)  She had her first recurrence February 2012  treated with carboplatin and Gemzar for  a total of 6 cycles between February and June 2012.  Her CEA-125 had normalized before the beginning of cycle 5.   (3) second recurrence documented February 2013,  treated with carboplatin/ doxil x 6, completed 10/03/2011, with a good response noted on restaging scans  PLAN:  We discussed small bowel obstruction issues in ovarian cancer patients. She understands this problem can occur in the absence of disease progression. I think she may indeed be having early partial bowel obstruction. I suggested she go on clear liquids, you suppositories and enemas to "move things from below", and reassess after 24 hours. If things are better she can go to full liquids, and when she has no dominant discomfort and normal bowel movements she can go to a regular diet. She should avoid narcotics and use nonsteroidals for pain, but she has very little pain at present. She understands if her pain does get worse she will have to go to the emergency room likely for admission. Of course this is happened before in the past and she has a good understanding of all this.  She was planning a trip out [place] to visit [place], but I don't think that that is a good idea. I went ahead and wrote her a note to see if she can get a medical excuse and get some of her travel money back.  She is ready scheduled for followup here in February, but of course she knows to call for any problems that may develop before the next visit.  MAGRINAT,GUSTAV C    02/26/2012

## 2012-03-12 ENCOUNTER — Other Ambulatory Visit: Payer: Self-pay | Admitting: *Deleted

## 2012-03-12 ENCOUNTER — Other Ambulatory Visit (HOSPITAL_BASED_OUTPATIENT_CLINIC_OR_DEPARTMENT_OTHER): Payer: Medicare Other | Admitting: Lab

## 2012-03-12 DIAGNOSIS — R309 Painful micturition, unspecified: Secondary | ICD-10-CM

## 2012-03-12 DIAGNOSIS — N23 Unspecified renal colic: Secondary | ICD-10-CM

## 2012-03-12 DIAGNOSIS — C569 Malignant neoplasm of unspecified ovary: Secondary | ICD-10-CM

## 2012-03-12 LAB — URINALYSIS, MICROSCOPIC - CHCC
Bilirubin (Urine): NEGATIVE
Ketones: NEGATIVE mg/dL
Protein: 30 mg/dL
Specific Gravity, Urine: 1.015 (ref 1.003–1.035)
pH: 6 (ref 4.6–8.0)

## 2012-03-12 MED ORDER — NITROFURANTOIN MONOHYD MACRO 100 MG PO CAPS: ORAL_CAPSULE | ORAL | Status: DC

## 2012-03-12 NOTE — Telephone Encounter (Signed)
Pt called to this RN earlier in day stating " I think I need to have my urine checked ".  U/A obtained with noted abnormality.  Per MD review specimen sent for culture and order obtained for macrodantin.

## 2012-04-09 ENCOUNTER — Other Ambulatory Visit (HOSPITAL_BASED_OUTPATIENT_CLINIC_OR_DEPARTMENT_OTHER): Payer: Medicare Other | Admitting: Lab

## 2012-04-09 DIAGNOSIS — C569 Malignant neoplasm of unspecified ovary: Secondary | ICD-10-CM

## 2012-04-09 LAB — COMPREHENSIVE METABOLIC PANEL (CC13)
AST: 16 U/L (ref 5–34)
BUN: 30.3 mg/dL — ABNORMAL HIGH (ref 7.0–26.0)
Calcium: 9.9 mg/dL (ref 8.4–10.4)
Chloride: 102 mEq/L (ref 98–107)
Creatinine: 1.4 mg/dL — ABNORMAL HIGH (ref 0.6–1.1)
Total Bilirubin: 0.63 mg/dL (ref 0.20–1.20)

## 2012-04-09 LAB — CBC WITH DIFFERENTIAL/PLATELET
BASO%: 0.3 % (ref 0.0–2.0)
Eosinophils Absolute: 0.2 10*3/uL (ref 0.0–0.5)
HCT: 35.6 % (ref 34.8–46.6)
MCHC: 34.5 g/dL (ref 31.5–36.0)
MONO#: 0.5 10*3/uL (ref 0.1–0.9)
NEUT#: 3.5 10*3/uL (ref 1.5–6.5)
NEUT%: 70.1 % (ref 38.4–76.8)
Platelets: 141 10*3/uL — ABNORMAL LOW (ref 145–400)
RBC: 3.45 10*6/uL — ABNORMAL LOW (ref 3.70–5.45)
WBC: 4.9 10*3/uL (ref 3.9–10.3)
lymph#: 0.8 10*3/uL — ABNORMAL LOW (ref 0.9–3.3)

## 2012-04-09 LAB — CA 125: CA 125: 79.6 U/mL — ABNORMAL HIGH (ref 0.0–30.2)

## 2012-04-10 ENCOUNTER — Other Ambulatory Visit: Payer: Self-pay | Admitting: Oncology

## 2012-04-10 ENCOUNTER — Other Ambulatory Visit: Payer: Medicare Other | Admitting: Lab

## 2012-04-10 ENCOUNTER — Telehealth: Payer: Self-pay | Admitting: Oncology

## 2012-04-10 ENCOUNTER — Other Ambulatory Visit: Payer: Self-pay | Admitting: Physician Assistant

## 2012-04-10 DIAGNOSIS — C569 Malignant neoplasm of unspecified ovary: Secondary | ICD-10-CM

## 2012-04-10 NOTE — Telephone Encounter (Signed)
Add to previous note...Marland KitchenMarland KitchenMarland Kitchen [facility] will contact pt re scan appt.

## 2012-04-10 NOTE — Telephone Encounter (Signed)
Moved 2/10 appt from AB to GM @ 10am per AB. S/w pt she is aware.

## 2012-04-13 ENCOUNTER — Ambulatory Visit (HOSPITAL_COMMUNITY)
Admission: RE | Admit: 2012-04-13 | Discharge: 2012-04-13 | Disposition: A | Discharge status: Home / Self Care | Payer: Medicare Other | Source: Ambulatory Visit | Attending: Oncology | Admitting: Oncology

## 2012-04-13 DIAGNOSIS — C569 Malignant neoplasm of unspecified ovary: Secondary | ICD-10-CM | POA: Insufficient documentation

## 2012-04-13 DIAGNOSIS — K5730 Diverticulosis of large intestine without perforation or abscess without bleeding: Secondary | ICD-10-CM | POA: Insufficient documentation

## 2012-04-13 DIAGNOSIS — K449 Diaphragmatic hernia without obstruction or gangrene: Secondary | ICD-10-CM | POA: Insufficient documentation

## 2012-04-13 DIAGNOSIS — Z90710 Acquired absence of both cervix and uterus: Secondary | ICD-10-CM | POA: Insufficient documentation

## 2012-04-13 DIAGNOSIS — I745 Embolism and thrombosis of iliac artery: Secondary | ICD-10-CM | POA: Insufficient documentation

## 2012-04-13 DIAGNOSIS — Z9221 Personal history of antineoplastic chemotherapy: Secondary | ICD-10-CM | POA: Insufficient documentation

## 2012-04-13 DIAGNOSIS — K8689 Other specified diseases of pancreas: Secondary | ICD-10-CM | POA: Insufficient documentation

## 2012-04-13 DIAGNOSIS — J9819 Other pulmonary collapse: Secondary | ICD-10-CM | POA: Insufficient documentation

## 2012-04-13 MED ORDER — IOHEXOL 300 MG/ML  SOLN
100.0000 mL | Freq: Once | INTRAMUSCULAR | Status: AC | PRN
  Administered 2012-04-13: 100 mL via INTRAVENOUS

## 2012-04-16 ENCOUNTER — Ambulatory Visit (HOSPITAL_BASED_OUTPATIENT_CLINIC_OR_DEPARTMENT_OTHER): Payer: Medicare Other | Admitting: Oncology

## 2012-04-16 ENCOUNTER — Other Ambulatory Visit: Payer: Self-pay | Admitting: Oncology

## 2012-04-16 ENCOUNTER — Telehealth: Payer: Self-pay | Admitting: Oncology

## 2012-04-16 VITALS — BP 145/87 | HR 77 | Temp 98.5°F | Resp 20 | Ht 66.0 in | Wt 151.9 lb

## 2012-04-16 DIAGNOSIS — C569 Malignant neoplasm of unspecified ovary: Secondary | ICD-10-CM

## 2012-04-16 NOTE — Telephone Encounter (Signed)
gv dtr appt schedule for februray.

## 2012-04-16 NOTE — Progress Notes (Signed)
ID: Regina West   DOB: 1929-12-10  MR#: [id number]  WUJ#:[id number]  PCP: Minda Meo, MD GYN: SU: Ivar Drape OTHER MD: Porfirio Mylar Dohmeier  HISTORY OF PRESENT ILLNESS: The patient originally presented in the summer of 2010 with cramps and abdominal distension.  I do not have a copy of the initial evaluation, but on November 26, 2008, the patient underwent optimal debulking with bilateral salpingo-oophorectomy, omentectomy, and the placement of an intraperitoneal port.  The pathology from that procedure (Accession Number [id number] at [facility]) showed first of all, significant involvement of the omentum, minimal involvement of the right ovary and right fallopian tube and negative on the left ovary and fallopian tube, with neither ovary being enlarged, consistent with a primary peritoneal serous adenocarcinoma, described as moderately differentiated.  The sample included no lymph nodes.    The patient had an intraperitoneal port placed in the same surgery and was treated with intraperitoneal and IV chemotherapy according to GOG-252 but I am not sure which arm she was in.  (Arm 2 did carboplatin intraperitoneally and Taxol IV.  Arm 3 did cisplatin and paclitaxel intraperitoneally with paclitaxel IV.)  All arms received bevacizumab.  Unfortunately, after 4 cycles of treatment, she had acute mental status changes and was admitted here January of 2011 with what proved to be posterior reversible leukoencephalopathy and SIADH.  She had seizures, aphasia, and required intubation.  Once the patient recovered from this, she was treated with 2 cycles of single-agent carboplatin (I do not have the AUC). Her last adjuvant treatment was May 12, 2009, and her CA-125 at that time was 10.3.  Her intraperitoneal port was removed in April of 2011.    More recently, the patient was in routine follow up when her CA-125 was found to jump up to 2,269.7 (March 26, 2010).  This is 10 months after her last prior chemotherapy.   She had CTs of the chest, abdomen and pelvis January 26th which showed ascites and enhancing peritoneal nodularity. She had had similar findings at presentation but these had completely resolved by the time she finished treatment in March of 2011. The patient was felt to be in first relapse. Subsequent treatments are as summarized below   INTERVAL HISTORY:  Regina West returns today with her daughter and her daughter-in-law for followup of Regina West's ovarian cancer. This was not a scheduled visit. Instead I called her last week with the elevated CA 125, now up to 80, and set her up for restaging scans. These were done very 09/23/2012 and are consistent with disease recurrence.  REVIEW OF SYSTEMS:  She is having no symptoms related to her cancer. She is moderately fatigued, but that is not a new problem for her, and her fatigue is not worse than usual. She gets a little "bruit" due to the weather, and takes an occasional afternoon nap, but not every day. She gets out of the house most of the day and yesterday she was out raking gum balls. She has had no problems with bowel movements or bladder function. She her antidepressants were changed and she is now on Viibryd, which she tells me is "working better". She has been taking Reglan but is not sure why and would like to stop that medication. Otherwise a detailed review of systems today was noncontributory.  PAST MEDICAL HISTORY: Significant for hysterectomy at the age of 3 with concurrent bladder "tuck up."  She also underwent appendectomy during that procedure.  She is status post prior bilateral cataract surgery. She is status  post tonsillectomy and adenoidectomy.  She has a history of hypertension and diabetes. She has a history of diverticulosis.  She has a large hiatal hernia.  She has degenerative disk disease.  She has been noted to have coronary calcifications and she has hyperlipidemia.  There is a history of remote tobacco abuse. Seizure disorder as per  HPI  FAMILY HISTORY The patient's father died at the age of 34 after heart surgery for aortic stenosis.  The patient's mother died at the age of 35.  The patient has one brother in good health.  There is no other family history to her knowledge of ovarian or breast cancer.    GYNECOLOGIC HISTORY: Menarche at age 36.  She is GX P3 with menopause in her late 63s.  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  Her husband was in sales and had a history of Parkinson's disease.  He died following a fall in the year 2000.  The patient's son Regina West lives in [place], and has a history of MS.  Daughter Regina West lives in [place]. She is a Futures trader.  Son Regina West lives in [place], [place] and is in Airline pilot. The patient has 8 grandchildren. She attends the [employer].  She is a good friend of our patients [name]   ADVANCED DIRECTIVES: in place  HEALTH MAINTENANCE: History  Substance Use Topics  . Smoking status: Former Smoker    Quit date: 05/24/1959  . Smokeless tobacco: Never Used  . Alcohol Use: No     Colonoscopy: 2008  PAP: UTD  MM: refuses  Allergies  Allergen Reactions  . Avastin (Bevacizumab) Other (See Comments)    Swelling of the brain     Current Outpatient Prescriptions  Medication Sig Dispense Refill  . acyclovir (ZOVIRAX) 400 MG tablet Take 1 tablet (400 mg total) by mouth 2 (two) times daily.  60 tablet  2  . antiseptic oral rinse (BIOTENE) LIQD 15 mLs by Mouth Rinse route 2 (two) times daily.  1 Bottle  0  . cholecalciferol (VITAMIN D) 1000 UNITS tablet Take 1,000 Units by mouth 2 (two) times daily.      Marland Kitchen Epinastine HCl 0.05 % ophthalmic solution       . escitalopram (LEXAPRO) 10 MG tablet       . levETIRAcetam (KEPPRA) 500 MG tablet Take 250 mg by mouth every 12 (twelve) hours. Pt takes 1/2 tab for 250 mg dose      . lidocaine-prilocaine  (EMLA) cream Apply 1 application topically as needed.      Marland Kitchen LOTEMAX 0.5 % OINT       . metoCLOPramide (REGLAN) 5 MG tablet Take 1 tablet (5 mg total) by mouth 3 (three) times daily before meals.  90 tablet  2  . mirtazapine (REMERON SOL-TAB) 15 MG disintegrating tablet Take 1 tablet (15 mg total) by mouth at bedtime.  30 tablet  3  . neomycin-polymyxin b-dexamethasone (MAXITROL) 3.5-10000-0.1 OINT       . nitrofurantoin, macrocrystal-monohydrate, (MACROBID) 100 MG capsule Take 1 tablet bid x 3 days then 1 tablet daily indifently  30 capsule  6  . omeprazole (PRILOSEC) 40 MG capsule Take 1 capsule (40 mg total) by mouth at bedtime.  30 capsule  3  . ondansetron (ZOFRAN-ODT) 8 MG disintegrating tablet Take 1 tablet (8 mg total) by mouth every 8 (eight) hours  as needed. nausea  20 tablet  3  . prochlorperazine (COMPAZINE) 10 MG tablet Take 1 tablet (10 mg total) by mouth every 6 (six) hours as needed.  30 tablet  0  . prochlorperazine (COMPAZINE) 10 MG tablet        No current facility-administered medications for this visit.    OBJECTIVE: Elderly white woman in no acute distress  Filed Vitals:   04/16/12 1129  BP: 145/87  Pulse: 77  Temp: 98.5 F (36.9 C)  Resp: 20     Body mass index is 24.53 kg/(m^2).    ECOG FS: 1 Filed Weights   04/16/12 1129  Weight: 151 lb 14.4 oz (68.901 kg)    Sclerae unicteric Oropharynx clear No cervical or supraclavicular adenopathy Lungs no rales or rhonchi Heart regular rate and rhythm Abd soft, minimally distended, positive bowel sounds, no masses palpated, no tenderness to palpation MSK no focal spinal tenderness, no peripheral edema Neuro: nonfocal, positive affect Breasts: Deferred  LAB RESULTS: Lab Results  Component Value Date   CA125 79.6* 04/09/2012   Lab Results  Component Value Date   WBC 4.9 04/09/2012   NEUTROABS 3.5 04/09/2012   HGB 12.3 04/09/2012   HCT 35.6 04/09/2012   MCV 103.1* 04/09/2012   PLT 141* 04/09/2012      Chemistry       Component Value Date/Time   NA 137 04/09/2012 1119   NA 136 10/17/2011 1118   NA 143 05/12/2011 1337   K 4.2 04/09/2012 1119   K 3.9 10/17/2011 1118   K 4.1 05/12/2011 1337   CL 102 04/09/2012 1119   CL 100 10/17/2011 1118   CL 92* 05/12/2011 1337   CO2 23 04/09/2012 1119   CO2 27 10/17/2011 1118   CO2 32 05/12/2011 1337   BUN 30.3* 04/09/2012 1119   BUN 25* 10/17/2011 1118   BUN 19 05/12/2011 1337   CREATININE 1.4* 04/09/2012 1119   CREATININE 1.10 10/17/2011 1118   CREATININE 1.3* 05/12/2011 1337      Component Value Date/Time   CALCIUM 9.9 04/09/2012 1119   CALCIUM 9.8 10/17/2011 1118   CALCIUM 9.8 05/12/2011 1337   ALKPHOS 124 04/09/2012 1119   ALKPHOS 109 10/17/2011 1118   ALKPHOS 95* 05/12/2011 1337   AST 16 04/09/2012 1119   AST 22 10/17/2011 1118   AST 40* 05/12/2011 1337   ALT 13 04/09/2012 1119   ALT 14 10/17/2011 1118   BILITOT 0.63 04/09/2012 1119   BILITOT 0.4 10/17/2011 1118   BILITOT 0.60 05/12/2011 1337       STUDIES: Dg Chest 2 View  04/13/2012  *RADIOLOGY REPORT*  Clinical Data: 79 year old female with history of ovarian cancer.  CHEST - 2 VIEW  Comparison: 10/31/2011 and earlier.  Findings: Chronic moderate to large hiatal hernia.  Contrast level in the hernia today. Other mediastinal contours are within normal limits.  Stable right chest Port-A-Cath.  No pulmonary nodule or mass identified.  Decreased mild atelectasis or scarring at the left costophrenic angle.  No pneumothorax, pulmonary edema, pleural effusion or acute pulmonary opacity. Stable visualized osseous structures.  IMPRESSION: No acute cardiopulmonary abnormality.   Original Report Authenticated By: Erskine Speed, M.D.    Ct Abdomen Pelvis W Contrast  04/13/2012  **ADDENDUM** CREATED: 04/13/2012 15:39:04  Extensive colonic diverticulosis as detailed above.  Apparent wall thickening in the sigmoid which may be due to underdistension or muscular hypertrophy.  Correlate with any left lower quadrant pain, as focal diverticulitis cannot be entirely  excluded. Recommend attention on follow-up.  **END ADDENDUM** SIGNED BY: Karn Cassis. Reche Dixon, M.D.   04/13/2012  *RADIOLOGY REPORT*  Clinical Data: Ovarian cancer diagnosed 4 years ago with chemotherapy completed 8/13.  Hysterectomy.  CT ABDOMEN AND PELVIS WITH CONTRAST  Technique:  Multidetector CT imaging of the abdomen and pelvis was performed following the standard protocol during bolus administration of intravenous contrast.  Contrast: OMNIPAQUE IOHEXOL 300 MG/ML  SOLN  Comparison: 10/31/2011  Findings: Lung bases:  Atelectasis at the anterior left lung base. Borderline cardiomegaly, without pericardial or pleural effusion.  A large hiatal hernia, similar.  Abdomen/pelvis:  Similar high density focus along the capsule of the right lobe of the liver on image 27/series 2.  Possibly dystrophic.  No intraparenchymal liver lesions.  Normal spleen, distal stomach.  Moderate pancreatic atrophy.  Normal gallbladder, biliary tract.  Normal right adrenal gland with minimal left adrenal nodularity, unchanged.  A left extrarenal pelvis.  Normal right kidney.  Retroperitoneal nodes are small and similar.  Similar appearance of soft tissue density along the left periaortic space on image 21/series 2.  Moderate stool within the rectum.  Extensive colonic diverticulosis.  Apparent wall thickening within the sigmoid on image 68/series 2. This could relate to underdistension or muscular hypertrophy.Normal terminal ileum.  Small bowel loops measure up to 3.3 cm on image 45/series 2 in the left side of the abdomen.  The immediately distal small bowel is more normal in caliber, including on image 60/series 2.    There are anteriorly positioned small bowel loops throughout, suggesting adhesions.  Subtle peritoneal/omental nodularity is suspected.  Example image 11/series 2 left upper quadrant and anterior to the splenic flexure colon image 16/series 2.  Absent on the prior.  There is mild fascial thickening in the right anterior  upper pelvis on image 49/series 2. Not readily apparent on the prior.  Mild right internal iliac dilatation with wall thrombus within image 58/series 2. No pelvic adenopathy.    Normal urinary bladder. Hysterectomy.  No adnexal mass.  The low density lesion described on the prior exam within the right central pelvis is no longer present. No significant free fluid.  Bones/Musculoskeletal:  No acute osseous abnormality. Thoracolumbar spondylosis with areas of trace retrolisthesis, likely secondary.  IMPRESSION: 1.  Subtle findings, including developing peritoneal/omental nodularity and serosal fascial thickening, which are suspicious for recurrent peritoneal disease. PET may be informative. 2.  Probable adhesions, with anterior position of small bowel loops.  Mildly dilated loop of mid small bowel for which low grade obstruction cannot be excluded.  Correlate with symptoms to suggest a low grade bowel obstruction. 3.  Similar soft tissue fullness along the left diaphragmatic crura; resolution of tiny low density lesions within the right central pelvis since the prior. 4. Large hiatal hernia, similar.   Original Report Authenticated By: Jeronimo Greaves, M.D.   .    ASSESSMENT: 79 y.o.  [place] woman   (1) status post optimal debulking of a primary peritoneal serous adenocarcinoma September 2010, the tumor being moderately differentiated, pT3c NX (stage IIIC), treated according to GOG 252 with  intraperitoneal platinum and paclitaxel x4 given along with bevacizumab, complicated by SIADHafter cycle 4, also with posterior reversible leukoencephalopathy.  After these problems resolved she received 2 additional cycles of single-agent carboplatin completed in March 2011  (2)  She had her first recurrence February 2012  treated with carboplatin and Gemzar for a total of 6 cycles between February and June 2012.  Her CEA-125 had normalized before  the beginning of cycle 5.   (3) second recurrence documented February  2013,  treated with carboplatin/ doxil x 6, completed 10/03/2011, with a good response noted on restaging scans  (4) third recurrence documented February 2014  PLAN:  Her CA125, which had been less than 20 last checked is now 68. CT scan is consistent with peritoneal recurrence. The family is very concerned that this may quickly lead to obstruction, since this has happened in the past. On the other hand she is having no symptoms now. When I called Zanetta to give her the news she told me she was seriously thinking of not having any more chemotherapy, but since then she has reconsidered and is willing to have further treatment.  I suggested she see Dr. Nedra Hai at [facility] and we could move up her appointment there, but what Marquasia prefers is for me to contact Dr. Nedra Hai and "due to work out" what her next treatment should be. She is going to see me again February 21 with a view to discussing the specific agents and hopes to start her treatment on February 24. She knows to call for any problems that may develop before the next visit. MAGRINAT,GUSTAV C    04/16/2012

## 2012-04-16 NOTE — Progress Notes (Signed)
ID: Regina West   DOB: 05-Jun-1929  MR#: [id number]  WUJ#:[id number]  HISTORY OF PRESENT ILLNESS: The patient originally presented in the summer of 2010 with cramps and abdominal distension.  I do not have a copy of the initial evaluation, but on November 26, 2008, the patient underwent optimal debulking with bilateral salpingo-oophorectomy, omentectomy, and the placement of an intraperitoneal port.  The pathology from that procedure (Accession Number [id number] at [facility]) showed first of all, significant involvement of the omentum, minimal involvement of the right ovary and right fallopian tube and negative on the left ovary and fallopian tube, with neither ovary being enlarged, consistent with a primary peritoneal serous adenocarcinoma, described as moderately differentiated.  The sample included no lymph nodes.    The patient had an intraperitoneal port placed in the same surgery and was treated with intraperitoneal and IV chemotherapy according to GOG-252 but I am not sure which arm she was in.  (Arm 2 did carboplatin intraperitoneally and Taxol IV.  Arm 3 did cisplatin and paclitaxel intraperitoneally with paclitaxel IV.)  All arms received bevacizumab.  Unfortunately, after 4 cycles of treatment, she had acute mental status changes and was admitted here January of 2011 with what proved to be posterior reversible leukoencephalopathy and SIADH.  She had seizures, aphasia, and required intubation.  Once the patient recovered from this, she was treated with 2 cycles of single-agent carboplatin (I do not have the AUC). Her last adjuvant treatment was May 12, 2009, and her CA-125 at that time was 10.3.  Her intraperitoneal port was removed in April of 2011.    More recently, the patient was in routine follow up when her CA-125 was found to jump up to 2,269.7 (March 26, 2010).  This is 10 months after her last prior chemotherapy.  She had CTs of the chest, abdomen and pelvis January 26th which showed  ascites and enhancing peritoneal nodularity. She had had similar findings at presentation but these had completely resolved by the time she finished treatment in March of 2011. The patient was felt to be in first relapse. Subsequent treatments are as summarized below   INTERVAL HISTORY:  Regina West returns today with one of her daughters for followup of Regina West's ovarian cancer. This was not a scheduled visit. Instead she called today to state that she had been having abdominal pain now for several days  REVIEW OF SYSTEMS:  She states "not feeling well". She saw Dr. Nedra West at [facility] on December 6 and everything at that time was falling. On December 16 she started noticing a little bit of abdominal discomfort, and on the 17th she had diarrhea. On the next couple of days of she continued to have abdominal problems, slight distention, and more discomfort and pain. She is "eating things that shouldn't be eating". This morning she felt weak, dehydrated, with increasing abdominal pain. We set her up for a KUB prior to the visit, and this does show a few mildly dilated air-filled loops of small bowel in the midabdomen. Aside from all this a detailed review of systems today was noncontributory   PAST MEDICAL HISTORY: Significant for hysterectomy at the age of 67 with concurrent bladder "tuck up."  She also underwent appendectomy during that procedure.  She is status post prior bilateral cataract surgery. She is status post tonsillectomy and adenoidectomy.  She has a history of hypertension and diabetes. She has a history of diverticulosis.  She has a large hiatal hernia.  She has degenerative disk disease.  She  has been noted to have coronary calcifications and she has hyperlipidemia.  There is a history of remote tobacco abuse. Seizure disorder as per HPI  FAMILY HISTORY The patient's father died at the age of 66 after heart surgery for aortic stenosis.  The patient's mother died at the age of 47.  The patient has one  brother in good health.  There is no other family history to her knowledge of ovarian or breast cancer.    GYNECOLOGIC HISTORY: Menarche at age 32.  She is GX P3 with menopause in her late 25s.  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  Her husband was in sales and had a history of Parkinson's disease.  He died following a fall in the year 2000.  The patient's son Regina West lives in [place], and has a history of MS.  Daughter Regina West lives in [place]. She is a Futures trader.  Son Regina West lives in [place], [place] and is in Airline pilot. The patient has 8 grandchildren. She attends the [employer].  She is a good friend of our patients [name]   ADVANCED DIRECTIVES: in place  HEALTH MAINTENANCE: History  Substance Use Topics  . Smoking status: Former Smoker    Quit date: 05/24/1959  . Smokeless tobacco: Never Used  . Alcohol Use: No     Colonoscopy: 2008  PAP: UTD  MM: refuses  Allergies  Allergen Reactions  . Avastin (Bevacizumab) Other (See Comments)    Swelling of the brain     Current Outpatient Prescriptions  Medication Sig Dispense Refill  . acyclovir (ZOVIRAX) 400 MG tablet Take 1 tablet (400 mg total) by mouth 2 (two) times daily.  60 tablet  2  . antiseptic oral rinse (BIOTENE) LIQD 15 mLs by Mouth Rinse route 2 (two) times daily.  1 Bottle  0  . cholecalciferol (VITAMIN D) 1000 UNITS tablet Take 1,000 Units by mouth 2 (two) times daily.      Marland Kitchen Epinastine HCl 0.05 % ophthalmic solution       . escitalopram (LEXAPRO) 10 MG tablet       . levETIRAcetam (KEPPRA) 500 MG tablet Take 250 mg by mouth every 12 (twelve) hours. Pt takes 1/2 tab for 250 mg dose      . lidocaine-prilocaine (EMLA) cream Apply 1 application topically as needed.      Marland Kitchen LOTEMAX 0.5 % OINT       . metoCLOPramide (REGLAN) 5 MG tablet Take 1 tablet (5 mg total) by mouth 3 (three)  times daily before meals.  90 tablet  2  . mirtazapine (REMERON SOL-TAB) 15 MG disintegrating tablet Take 1 tablet (15 mg total) by mouth at bedtime.  30 tablet  3  . neomycin-polymyxin b-dexamethasone (MAXITROL) 3.5-10000-0.1 OINT       . nitrofurantoin, macrocrystal-monohydrate, (MACROBID) 100 MG capsule Take 1 tablet bid x 3 days then 1 tablet daily indifently  30 capsule  6  . omeprazole (PRILOSEC) 40 MG capsule Take 1 capsule (40 mg total) by mouth at bedtime.  30 capsule  3  . ondansetron (ZOFRAN-ODT) 8 MG disintegrating tablet Take 1 tablet (8 mg total) by mouth every 8 (eight) hours as needed. nausea  20 tablet  3  . prochlorperazine (COMPAZINE) 10 MG tablet Take 1 tablet (10 mg total) by mouth every 6 (six) hours as needed.  30 tablet  0  .  prochlorperazine (COMPAZINE) 10 MG tablet        No current facility-administered medications for this visit.    OBJECTIVE: Elderly white woman in no acute distress  Filed Vitals:   04/16/12 1129  BP: 145/87  Pulse: 77  Temp: 98.5 F (36.9 C)  Resp: 20     Body mass index is 24.53 kg/(m^2).    ECOG FS: 1 Filed Weights   04/16/12 1129  Weight: 151 lb 14.4 oz (68.901 kg)    Sclerae unicteric Oropharynx clear No cervical or supraclavicular adenopathy Lungs no rales or rhonchi Heart regular rate and rhythm Abd soft, moderately distended, positive bowel sounds, no masses palpated, no tenderness to palpation MSK no focal spinal tenderness, no peripheral edema Neuro: nonfocal Breasts: Deferred  LAB RESULTS: Lab Results  Component Value Date   CA125 79.6* 04/09/2012   Lab Results  Component Value Date   WBC 4.9 04/09/2012   NEUTROABS 3.5 04/09/2012   HGB 12.3 04/09/2012   HCT 35.6 04/09/2012   MCV 103.1* 04/09/2012   PLT 141* 04/09/2012      Chemistry      Component Value Date/Time   NA 137 04/09/2012 1119   NA 136 10/17/2011 1118   NA 143 05/12/2011 1337   K 4.2 04/09/2012 1119   K 3.9 10/17/2011 1118   K 4.1 05/12/2011 1337   CL 102  04/09/2012 1119   CL 100 10/17/2011 1118   CL 92* 05/12/2011 1337   CO2 23 04/09/2012 1119   CO2 27 10/17/2011 1118   CO2 32 05/12/2011 1337   BUN 30.3* 04/09/2012 1119   BUN 25* 10/17/2011 1118   BUN 19 05/12/2011 1337   CREATININE 1.4* 04/09/2012 1119   CREATININE 1.10 10/17/2011 1118   CREATININE 1.3* 05/12/2011 1337      Component Value Date/Time   CALCIUM 9.9 04/09/2012 1119   CALCIUM 9.8 10/17/2011 1118   CALCIUM 9.8 05/12/2011 1337   ALKPHOS 124 04/09/2012 1119   ALKPHOS 109 10/17/2011 1118   ALKPHOS 95* 05/12/2011 1337   AST 16 04/09/2012 1119   AST 22 10/17/2011 1118   AST 40* 05/12/2011 1337   ALT 13 04/09/2012 1119   ALT 14 10/17/2011 1118   BILITOT 0.63 04/09/2012 1119   BILITOT 0.4 10/17/2011 1118   BILITOT 0.60 05/12/2011 1337     CA 125 <20 February 06, 2012  STUDIES: Dg Abd 2 Views  02/24/2012  *RADIOLOGY REPORT*  Clinical Data: Abdominal discomfort, constipation, evaluate for obstruction, history hypertension, hiatal hernia, ovarian cancer  ABDOMEN - 2 VIEW  Comparison: 06/06/2011  Findings: Several mildly dilated small bowel loops seen in mid abdomen up to 3.6 cm diameter. Small amount of gas present in the proximal half of the colon. No definite bowel wall thickening or free intraperitoneal air. Large hiatal hernia. Bones diffusely demineralized with degenerative changes of the thoracolumbar spine. No definite urinary tract calcification. Lung bases clear.  IMPRESSION: Few mildly dilated air-filled loops of small bowel in the mid abdomen, could represent early or partial small bowel obstruction. Large hiatal hernia.   Original Report Authenticated By: Ulyses Southward, M.D.    ASSESSMENT: 79 y.o.  [place] woman   (1) status post optimal debulking of a primary peritoneal serous adenocarcinoma September 2010, the tumor being moderately differentiated, pT3c NX (stage IIIC), treated according to GOG 252 with  intraperitoneal platinum and paclitaxel x4 given along with bevacizumab, complicated by SIADHafter  cycle 4, also with posterior reversible leukoencephalopathy.  After these problems resolved she  received 2 additional cycles of single-agent carboplatin completed in March 2011  (2)  She had her first recurrence February 2012  treated with carboplatin and Gemzar for a total of 6 cycles between February and June 2012.  Her CEA-125 had normalized before the beginning of cycle 5.   (3) second recurrence documented February 2013,  treated with carboplatin/ doxil x 6, completed 10/03/2011, with a good response noted on restaging scans  PLAN:  We discussed small bowel obstruction issues in ovarian cancer patients. She understands this problem can occur in the absence of disease progression. I think she may indeed be having early partial bowel obstruction. I suggested she go on clear liquids, you suppositories and enemas to "move things from below", and reassess after 24 hours. If things are better she can go to full liquids, and when she has no dominant discomfort and normal bowel movements she can go to a regular diet. She should avoid narcotics and use nonsteroidals for pain, but she has very little pain at present. She understands if her pain does get worse she will have to go to the emergency room likely for admission. Of course this is happened before in the past and she has a good understanding of all this.  She was planning a trip out [place] to visit family, but I don't think that that is a good idea. I went ahead and wrote her a note to see if she can get a medical excuse and get some of her travel money back.  She is ready scheduled for followup here in February, but of course she knows to call for any problems that may develop before the next visit.  MAGRINAT,GUSTAV C    04/16/2012

## 2012-04-17 ENCOUNTER — Ambulatory Visit: Payer: Medicare Other | Admitting: Physician Assistant

## 2012-04-23 ENCOUNTER — Other Ambulatory Visit: Payer: Self-pay | Admitting: *Deleted

## 2012-04-23 ENCOUNTER — Other Ambulatory Visit (HOSPITAL_BASED_OUTPATIENT_CLINIC_OR_DEPARTMENT_OTHER): Payer: Medicare Other | Admitting: Lab

## 2012-04-23 DIAGNOSIS — N390 Urinary tract infection, site not specified: Secondary | ICD-10-CM

## 2012-04-23 LAB — URINALYSIS, MICROSCOPIC - CHCC
Blood: NEGATIVE
Ketones: NEGATIVE mg/dL
Protein: NEGATIVE mg/dL
RBC / HPF: NEGATIVE (ref 0–2)
Specific Gravity, Urine: 1.02 (ref 1.003–1.035)

## 2012-04-27 ENCOUNTER — Telehealth: Payer: Self-pay | Admitting: Physician Assistant

## 2012-04-27 ENCOUNTER — Ambulatory Visit (HOSPITAL_BASED_OUTPATIENT_CLINIC_OR_DEPARTMENT_OTHER): Payer: Medicare Other | Admitting: Oncology

## 2012-04-27 ENCOUNTER — Other Ambulatory Visit (HOSPITAL_BASED_OUTPATIENT_CLINIC_OR_DEPARTMENT_OTHER): Payer: Medicare Other | Admitting: Lab

## 2012-04-27 ENCOUNTER — Telehealth: Payer: Self-pay | Admitting: *Deleted

## 2012-04-27 VITALS — BP 120/74 | HR 74 | Temp 98.3°F | Resp 20 | Ht 66.0 in | Wt 151.9 lb

## 2012-04-27 DIAGNOSIS — C569 Malignant neoplasm of unspecified ovary: Secondary | ICD-10-CM

## 2012-04-27 LAB — CBC WITH DIFFERENTIAL/PLATELET
Basophils Absolute: 0 10*3/uL (ref 0.0–0.1)
HCT: 35.1 % (ref 34.8–46.6)
HGB: 11.7 g/dL (ref 11.6–15.9)
LYMPH%: 20.1 % (ref 14.0–49.7)
MCH: 34.3 pg — ABNORMAL HIGH (ref 25.1–34.0)
MCHC: 33.3 g/dL (ref 31.5–36.0)
MONO#: 0.4 10*3/uL (ref 0.1–0.9)
MONO%: 10.6 % (ref 0.0–14.0)
NEUT%: 66.6 % (ref 38.4–76.8)
Platelets: 130 10*3/uL — ABNORMAL LOW (ref 145–400)

## 2012-04-27 LAB — COMPREHENSIVE METABOLIC PANEL (CC13)
ALT: 14 U/L (ref 0–55)
Alkaline Phosphatase: 114 U/L (ref 40–150)
CO2: 28 mEq/L (ref 22–29)
Potassium: 4.6 mEq/L (ref 3.5–5.1)
Sodium: 140 mEq/L (ref 136–145)
Total Bilirubin: 0.6 mg/dL (ref 0.20–1.20)
Total Protein: 6.5 g/dL (ref 6.4–8.3)

## 2012-04-27 LAB — CA 125: CA 125: 139.3 U/mL — ABNORMAL HIGH (ref 0.0–30.2)

## 2012-04-27 MED ORDER — PROCHLORPERAZINE MALEATE 10 MG PO TABS: 10.0000 mg | ORAL_TABLET | Freq: Four times a day (QID) | ORAL | Status: DC | PRN

## 2012-04-27 MED ORDER — PROCHLORPERAZINE 25 MG RE SUPP: 25.0000 mg | Freq: Two times a day (BID) | RECTAL | Status: DC | PRN

## 2012-04-27 MED ORDER — ONDANSETRON HCL 8 MG PO TABS: 8.0000 mg | ORAL_TABLET | Freq: Two times a day (BID) | ORAL | Status: DC

## 2012-04-27 MED ORDER — DEXAMETHASONE 4 MG PO TABS: 8.0000 mg | ORAL_TABLET | Freq: Two times a day (BID) | ORAL | Status: DC

## 2012-04-27 NOTE — Telephone Encounter (Signed)
Per staff message and POF I have scheduled appts.  JMW  

## 2012-04-27 NOTE — Progress Notes (Signed)
ID: Regina West   DOB: 12-30-1929  MR#: [id number]  OZH#:[id number]  PCP: Minda Meo, MD GYN: SU: Ivar Drape OTHER MD: Porfirio Mylar Dohmeier  HISTORY OF PRESENT ILLNESS: The patient originally presented in the summer of 2010 with cramps and abdominal distension.  I do not have a copy of the initial evaluation, but on November 26, 2008, the patient underwent optimal debulking with bilateral salpingo-oophorectomy, omentectomy, and the placement of an intraperitoneal port.  The pathology from that procedure (Accession Number [id number] at [facility]) showed first of all, significant involvement of the omentum, minimal involvement of the right ovary and right fallopian tube and negative on the left ovary and fallopian tube, with neither ovary being enlarged, consistent with a primary peritoneal serous adenocarcinoma, described as moderately differentiated.  The sample included no lymph nodes.    The patient had an intraperitoneal port placed in the same surgery and was treated with intraperitoneal and IV chemotherapy according to GOG-252 but I am not sure which arm she was in.  (Arm 2 did carboplatin intraperitoneally and Taxol IV.  Arm 3 did cisplatin and paclitaxel intraperitoneally with paclitaxel IV.)  All arms received bevacizumab.  Unfortunately, after 4 cycles of treatment, she had acute mental status changes and was admitted here January of 2011 with what proved to be posterior reversible leukoencephalopathy and SIADH.  She had seizures, aphasia, and required intubation.  Once the patient recovered from this, she was treated with 2 cycles of single-agent carboplatin (I do not have the AUC). Her last adjuvant treatment was May 12, 2009, and her CA-125 at that time was 10.3.  Her intraperitoneal port was removed in April of 2011.    More recently, the patient was in routine follow up when her CA-125 was found to jump up to 2,269.7 (March 26, 2010).  This is 10 months after her last prior chemotherapy.   She had CTs of the chest, abdomen and pelvis January 26th which showed ascites and enhancing peritoneal nodularity. She had had similar findings at presentation but these had completely resolved by the time she finished treatment in March of 2011. The patient was felt to be in first relapse. Subsequent treatments are as summarized below   INTERVAL HISTORY:  Regina West returns today with her daughter and her daughter-in-law for followup of Regina West's ovarian cancer. Since her last visit here we conferred with Dr. Nedra Hai at [facility] and she suggested carboplatin alone as initial treatment. Regina West is here to operationalize that decision.  REVIEW OF SYSTEMS:  She continues to be asymptomatic as far as her ovarian cancer is concerned, and in particular there have been no problems with bowel movements or bladder function. There has been no abdominal swelling or pain. Her appetite is fine and her sense of taste is normal. She does have some hot hot flashes. Overall there have been no new symptoms since the last visit here.  PAST MEDICAL HISTORY: Significant for hysterectomy at the age of 82 with concurrent bladder "tuck up."  She also underwent appendectomy during that procedure.  She is status post prior bilateral cataract surgery. She is status post tonsillectomy and adenoidectomy.  She has a history of hypertension and diabetes. She has a history of diverticulosis.  She has a large hiatal hernia.  She has degenerative disk disease.  She has been noted to have coronary calcifications and she has hyperlipidemia.  There is a history of remote tobacco abuse. Seizure disorder as per HPI  FAMILY HISTORY The patient's father died at the age  of 74 after heart surgery for aortic stenosis.  The patient's mother died at the age of 30.  The patient has one brother in good health.  There is no other family history to her knowledge of ovarian or breast cancer.    GYNECOLOGIC HISTORY: Menarche at age 87.  She is GX P3 with  menopause in her late 79s.  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  Her husband was in sales and had a history of Parkinson's disease.  He died following a fall in the year 2000.  The patient's son Regina West lives in [place], and has a history of MS.  Daughter Regina West lives in [place]. She is a Futures trader.  Son Regina West lives in [place], [place] and is in Airline pilot. The patient has 8 grandchildren. She attends the [employer].  She is a good friend of our patients [name]   ADVANCED DIRECTIVES: in place  HEALTH MAINTENANCE: History  Substance Use Topics  . Smoking status: Former Smoker    Quit date: 05/24/1959  . Smokeless tobacco: Never Used  . Alcohol Use: No     Colonoscopy: 2008  PAP: UTD  MM: refuses  Allergies  Allergen Reactions  . Avastin (Bevacizumab) Other (See Comments)    Swelling of the brain     Current Outpatient Prescriptions  Medication Sig Dispense Refill  . acyclovir (ZOVIRAX) 400 MG tablet Take 1 tablet (400 mg total) by mouth 2 (two) times daily.  60 tablet  2  . antiseptic oral rinse (BIOTENE) LIQD 15 mLs by Mouth Rinse route 2 (two) times daily.  1 Bottle  0  . cholecalciferol (VITAMIN D) 1000 UNITS tablet Take 1,000 Units by mouth 2 (two) times daily.      Marland Kitchen Epinastine HCl 0.05 % ophthalmic solution       . escitalopram (LEXAPRO) 10 MG tablet       . levETIRAcetam (KEPPRA) 500 MG tablet Take 250 mg by mouth every 12 (twelve) hours. Pt takes 1/2 tab for 250 mg dose      . lidocaine-prilocaine (EMLA) cream Apply 1 application topically as needed.      Marland Kitchen LOTEMAX 0.5 % OINT       . metoCLOPramide (REGLAN) 5 MG tablet Take 1 tablet (5 mg total) by mouth 3 (three) times daily before meals.  90 tablet  2  . mirtazapine (REMERON SOL-TAB) 15 MG disintegrating tablet Take 1 tablet (15 mg total) by mouth at bedtime.  30 tablet  3  .  neomycin-polymyxin b-dexamethasone (MAXITROL) 3.5-10000-0.1 OINT       . nitrofurantoin, macrocrystal-monohydrate, (MACROBID) 100 MG capsule Take 1 tablet bid x 3 days then 1 tablet daily indifently  30 capsule  6  . omeprazole (PRILOSEC) 40 MG capsule Take 1 capsule (40 mg total) by mouth at bedtime.  30 capsule  3  . ondansetron (ZOFRAN-ODT) 8 MG disintegrating tablet Take 1 tablet (8 mg total) by mouth every 8 (eight) hours as needed. nausea  20 tablet  3  . prochlorperazine (COMPAZINE) 10 MG tablet Take 1 tablet (10 mg total) by mouth every 6 (six) hours as needed.  30 tablet  0  . prochlorperazine (COMPAZINE) 10 MG tablet        No current facility-administered medications for this visit.    OBJECTIVE: Elderly white woman who appears well  Filed Vitals:  04/27/12 1118  BP: 120/74  Pulse: 74  Temp: 98.3 F (36.8 C)  Resp: 20     Body mass index is 24.53 kg/(m^2).    ECOG FS: 1 Filed Weights   04/27/12 1118  Weight: 151 lb 14.4 oz (68.901 kg)    Sclerae unicteric Oropharynx clear No cervical or supraclavicular adenopathy Lungs no rales or rhonchi Heart regular rate and rhythm Abd soft, notdistended, positive bowel sounds, no masses palpated, no tenderness to palpation MSK no focal spinal tenderness, no peripheral edema Neuro: nonfocal, positive affect Breasts: Deferred  LAB RESULTS: Lab Results  Component Value Date   CA125 79.6* 04/09/2012   Lab Results  Component Value Date   WBC 3.8* 04/27/2012   NEUTROABS 2.5 04/27/2012   HGB 11.7 04/27/2012   HCT 35.1 04/27/2012   MCV 102.9* 04/27/2012   PLT 130* 04/27/2012      Chemistry      Component Value Date/Time   NA 140 04/27/2012 1050   NA 136 10/17/2011 1118   NA 143 05/12/2011 1337   K 4.6 04/27/2012 1050   K 3.9 10/17/2011 1118   K 4.1 05/12/2011 1337   CL 103 04/27/2012 1050   CL 100 10/17/2011 1118   CL 92* 05/12/2011 1337   CO2 28 04/27/2012 1050   CO2 27 10/17/2011 1118   CO2 32 05/12/2011 1337   BUN 26.7* 04/27/2012  1050   BUN 25* 10/17/2011 1118   BUN 19 05/12/2011 1337   CREATININE 1.3* 04/27/2012 1050   CREATININE 1.10 10/17/2011 1118   CREATININE 1.3* 05/12/2011 1337      Component Value Date/Time   CALCIUM 10.2 04/27/2012 1050   CALCIUM 9.8 10/17/2011 1118   CALCIUM 9.8 05/12/2011 1337   ALKPHOS 114 04/27/2012 1050   ALKPHOS 109 10/17/2011 1118   ALKPHOS 95* 05/12/2011 1337   AST 20 04/27/2012 1050   AST 22 10/17/2011 1118   AST 40* 05/12/2011 1337   ALT 14 04/27/2012 1050   ALT 14 10/17/2011 1118   BILITOT 0.60 04/27/2012 1050   BILITOT 0.4 10/17/2011 1118   BILITOT 0.60 05/12/2011 1337       STUDIES: Dg Chest 2 View  04/13/2012  *RADIOLOGY REPORT*  Clinical Data: 79 year old female with history of ovarian cancer.  CHEST - 2 VIEW  Comparison: 10/31/2011 and earlier.  Findings: Chronic moderate to large hiatal hernia.  Contrast level in the hernia today. Other mediastinal contours are within normal limits.  Stable right chest Port-A-Cath.  No pulmonary nodule or mass identified.  Decreased mild atelectasis or scarring at the left costophrenic angle.  No pneumothorax, pulmonary edema, pleural effusion or acute pulmonary opacity. Stable visualized osseous structures.  IMPRESSION: No acute cardiopulmonary abnormality.   Original Report Authenticated By: Erskine Speed, M.D.    Ct Abdomen Pelvis W Contrast  04/13/2012  **ADDENDUM** CREATED: 04/13/2012 15:39:04  Extensive colonic diverticulosis as detailed above.  Apparent wall thickening in the sigmoid which may be due to underdistension or muscular hypertrophy.  Correlate with any left lower quadrant pain, as focal diverticulitis cannot be entirely excluded. Recommend attention on follow-up.  **END ADDENDUM** SIGNED BY: Karn Cassis. Reche Dixon, M.D.   04/13/2012  *RADIOLOGY REPORT*  Clinical Data: Ovarian cancer diagnosed 4 years ago with chemotherapy completed 8/13.  Hysterectomy.  CT ABDOMEN AND PELVIS WITH CONTRAST  Technique:  Multidetector CT imaging of the abdomen and pelvis was  performed following the standard protocol during bolus administration of intravenous contrast.  Contrast: OMNIPAQUE IOHEXOL 300 MG/ML  SOLN  Comparison: 10/31/2011  Findings: Lung bases:  Atelectasis at the anterior left lung base. Borderline cardiomegaly, without pericardial or pleural effusion.  A large hiatal hernia, similar.  Abdomen/pelvis:  Similar high density focus along the capsule of the right lobe of the liver on image 27/series 2.  Possibly dystrophic.  No intraparenchymal liver lesions.  Normal spleen, distal stomach.  Moderate pancreatic atrophy.  Normal gallbladder, biliary tract.  Normal right adrenal gland with minimal left adrenal nodularity, unchanged.  A left extrarenal pelvis.  Normal right kidney.  Retroperitoneal nodes are small and similar.  Similar appearance of soft tissue density along the left periaortic space on image 21/series 2.  Moderate stool within the rectum.  Extensive colonic diverticulosis.  Apparent wall thickening within the sigmoid on image 68/series 2. This could relate to underdistension or muscular hypertrophy.Normal terminal ileum.  Small bowel loops measure up to 3.3 cm on image 45/series 2 in the left side of the abdomen.  The immediately distal small bowel is more normal in caliber, including on image 60/series 2.    There are anteriorly positioned small bowel loops throughout, suggesting adhesions.  Subtle peritoneal/omental nodularity is suspected.  Example image 11/series 2 left upper quadrant and anterior to the splenic flexure colon image 16/series 2.  Absent on the prior.  There is mild fascial thickening in the right anterior upper pelvis on image 49/series 2. Not readily apparent on the prior.  Mild right internal iliac dilatation with wall thrombus within image 58/series 2. No pelvic adenopathy.    Normal urinary bladder. Hysterectomy.  No adnexal mass.  The low density lesion described on the prior exam within the right central pelvis is no longer  present. No significant free fluid.  Bones/Musculoskeletal:  No acute osseous abnormality. Thoracolumbar spondylosis with areas of trace retrolisthesis, likely secondary.  IMPRESSION: 1.  Subtle findings, including developing peritoneal/omental nodularity and serosal fascial thickening, which are suspicious for recurrent peritoneal disease. PET may be informative. 2.  Probable adhesions, with anterior position of small bowel loops.  Mildly dilated loop of mid small bowel for which low grade obstruction cannot be excluded.  Correlate with symptoms to suggest a low grade bowel obstruction. 3.  Similar soft tissue fullness along the left diaphragmatic crura; resolution of tiny low density lesions within the right central pelvis since the prior. 4. Large hiatal hernia, similar.   Original Report Authenticated By: Jeronimo Greaves, M.D.   .    ASSESSMENT: 79 y.o.  [place] woman   (1) status post optimal debulking of a primary peritoneal serous adenocarcinoma September 2010, the tumor being moderately differentiated, pT3c NX (stage IIIC), treated according to GOG 252 with  intraperitoneal platinum and paclitaxel x4 given along with bevacizumab, complicated by SIADHafter cycle 4, also with posterior reversible leukoencephalopathy.  After these problems resolved she received 2 additional cycles of single-agent carboplatin completed in March 2011  (2)  She had her first recurrence February 2012  treated with carboplatin and Gemzar for a total of 6 cycles between February and June 2012.  Her CEA-125 had normalized before the beginning of cycle 5.   (3) second recurrence documented February 2013,  treated with carboplatin/ doxil x 6, completed 10/03/2011, with a good response noted on restaging scans  (4) third recurrence documented February 2014  PLAN:  We will start her every three-week carboplatin on February 24. Today I wrote for ondansetron, dexamethasone, and prochlorperazine, with the first 2 taken twice  daily for 3 days and a prochlorperazine to  be taken every 6 hours as needed. Shaquera will see as the first week in March just to make sure she tolerated the first cycle well, and she will see [place] with each subsequent treatment and the second week of each treatment. She will see me after 3 cycles. We will check her CA 125 at the beginning of each treatment, and if we are not having a significant response by then Dr. Nedra Hai suggests weekly Taxol at 60-80 mg per meter squared. The patient is moving her appointment with Dr. Nedra Hai to April, so she can discuss her treatment results and prognosis. Williemae knows to call us for any problems that may develop before next visit here. Vladislav Axelson C    04/27/2012

## 2012-04-30 ENCOUNTER — Telehealth: Payer: Self-pay | Admitting: Oncology

## 2012-04-30 ENCOUNTER — Other Ambulatory Visit (HOSPITAL_BASED_OUTPATIENT_CLINIC_OR_DEPARTMENT_OTHER): Payer: Medicare Other | Admitting: Lab

## 2012-04-30 ENCOUNTER — Ambulatory Visit (HOSPITAL_BASED_OUTPATIENT_CLINIC_OR_DEPARTMENT_OTHER): Payer: Medicare Other

## 2012-04-30 VITALS — BP 120/63 | HR 64 | Temp 98.5°F | Resp 18

## 2012-04-30 DIAGNOSIS — C569 Malignant neoplasm of unspecified ovary: Secondary | ICD-10-CM

## 2012-04-30 DIAGNOSIS — Z5111 Encounter for antineoplastic chemotherapy: Secondary | ICD-10-CM

## 2012-04-30 LAB — COMPREHENSIVE METABOLIC PANEL (CC13)
ALT: 15 U/L (ref 0–55)
AST: 20 U/L (ref 5–34)
Alkaline Phosphatase: 106 U/L (ref 40–150)
Sodium: 140 mEq/L (ref 136–145)
Total Bilirubin: 0.38 mg/dL (ref 0.20–1.20)
Total Protein: 6.4 g/dL (ref 6.4–8.3)

## 2012-04-30 LAB — CBC WITH DIFFERENTIAL/PLATELET
BASO%: 0.2 % (ref 0.0–2.0)
EOS%: 2.4 % (ref 0.0–7.0)
Eosinophils Absolute: 0.1 10*3/uL (ref 0.0–0.5)
MCHC: 33.6 g/dL (ref 31.5–36.0)
MCV: 103.4 fL — ABNORMAL HIGH (ref 79.5–101.0)
MONO%: 8.5 % (ref 0.0–14.0)
NEUT#: 3.3 10*3/uL (ref 1.5–6.5)
RBC: 3.22 10*6/uL — ABNORMAL LOW (ref 3.70–5.45)
RDW: 13 % (ref 11.2–14.5)
nRBC: 0 % (ref 0–0)

## 2012-04-30 MED ORDER — CARBOPLATIN CHEMO INTRADERMAL TEST DOSE 100MCG/0.02ML
100.0000 ug | Freq: Once | INTRADERMAL | Status: AC
  Administered 2012-04-30: 100 ug via INTRADERMAL
  Filled 2012-04-30: qty 0.01

## 2012-04-30 MED ORDER — HEPARIN SOD (PORK) LOCK FLUSH 100 UNIT/ML IV SOLN
500.0000 [IU] | Freq: Once | INTRAVENOUS | Status: AC | PRN
  Administered 2012-04-30: 500 [IU]
  Filled 2012-04-30: qty 5

## 2012-04-30 MED ORDER — SODIUM CHLORIDE 0.9 % IV SOLN
312.5000 mg | Freq: Once | INTRAVENOUS | Status: AC
  Administered 2012-04-30: 310 mg via INTRAVENOUS
  Filled 2012-04-30: qty 31

## 2012-04-30 MED ORDER — SODIUM CHLORIDE 0.9 % IV SOLN
Freq: Once | INTRAVENOUS | Status: AC
  Administered 2012-04-30: 12:00:00 via INTRAVENOUS

## 2012-04-30 MED ORDER — DEXAMETHASONE SODIUM PHOSPHATE 4 MG/ML IJ SOLN
20.0000 mg | Freq: Once | INTRAMUSCULAR | Status: AC
  Administered 2012-04-30: 20 mg via INTRAVENOUS

## 2012-04-30 MED ORDER — ONDANSETRON 16 MG/50ML IVPB (CHCC)
16.0000 mg | Freq: Once | INTRAVENOUS | Status: AC
  Administered 2012-04-30: 16 mg via INTRAVENOUS

## 2012-04-30 MED ORDER — SODIUM CHLORIDE 0.9 % IJ SOLN
10.0000 mL | INTRAMUSCULAR | Status: DC | PRN
  Administered 2012-04-30: 10 mL
  Filled 2012-04-30: qty 10

## 2012-04-30 NOTE — Telephone Encounter (Signed)
Dtr stopped by to check on 3/17 appt. Still awaiting time for 3/17 f/u. dtr was given given lb/fu for 3/4 as ordered per 2/21 pof to see AB 1st wk in March. dtr aware she will be contacted re appt.

## 2012-04-30 NOTE — Patient Instructions (Addendum)
[facility] Discharge Instructions for Patients Receiving Chemotherapy  Today you received the following chemotherapy agents carboplatin  To help prevent nausea and vomiting after your treatment, we encourage you to take your nausea medication  and take it as often as prescribed   If you develop nausea and vomiting that is not controlled by your nausea medication, call the clinic. If it is after clinic hours your family physician or the after hours number for the clinic or go to the Emergency Department.   BELOW ARE SYMPTOMS THAT SHOULD BE REPORTED IMMEDIATELY:  *FEVER GREATER THAN 100.5 F  *CHILLS WITH OR WITHOUT FEVER  NAUSEA AND VOMITING THAT IS NOT CONTROLLED WITH YOUR NAUSEA MEDICATION  *UNUSUAL SHORTNESS OF BREATH  *UNUSUAL BRUISING OR BLEEDING  TENDERNESS IN MOUTH AND THROAT WITH OR WITHOUT PRESENCE OF ULCERS  *URINARY PROBLEMS  *BOWEL PROBLEMS  UNUSUAL RASH Items with * indicate a potential emergency and should be followed up as soon as possible.  One of the nurses will contact you 24 hours after your treatment. Please let the nurse know about any problems that you may have experienced. Feel free to call the clinic you have any questions or concerns. The clinic phone number is [phone].   I have been informed and understand all the instructions given to me. I know to contact the clinic, my physician, or go to the Emergency Department if any problems should occur. I do not have any questions at this time, but understand that I may call the clinic during office hours   should I have any questions or need assistance in obtaining follow up care.    __________________________________________  _____________  __________ Signature of Patient or Authorized Representative            Date                   Time    __________________________________________ Nurse's Signature

## 2012-05-04 ENCOUNTER — Telehealth: Payer: Self-pay | Admitting: *Deleted

## 2012-05-07 ENCOUNTER — Ambulatory Visit (HOSPITAL_BASED_OUTPATIENT_CLINIC_OR_DEPARTMENT_OTHER): Payer: Medicare Other

## 2012-05-07 ENCOUNTER — Other Ambulatory Visit: Payer: Self-pay | Admitting: *Deleted

## 2012-05-07 ENCOUNTER — Other Ambulatory Visit (HOSPITAL_BASED_OUTPATIENT_CLINIC_OR_DEPARTMENT_OTHER): Payer: Medicare Other | Admitting: Lab

## 2012-05-07 DIAGNOSIS — C569 Malignant neoplasm of unspecified ovary: Secondary | ICD-10-CM

## 2012-05-07 DIAGNOSIS — E860 Dehydration: Secondary | ICD-10-CM

## 2012-05-07 DIAGNOSIS — I951 Orthostatic hypotension: Secondary | ICD-10-CM | POA: Insufficient documentation

## 2012-05-07 LAB — CBC WITH DIFFERENTIAL/PLATELET
Eosinophils Absolute: 0 10*3/uL (ref 0.0–0.5)
HCT: 36.8 % (ref 34.8–46.6)
HGB: 12.6 g/dL (ref 11.6–15.9)
LYMPH%: 24 % (ref 14.0–49.7)
MONO#: 0.5 10*3/uL (ref 0.1–0.9)
NEUT#: 3.2 10*3/uL (ref 1.5–6.5)
NEUT%: 65.2 % (ref 38.4–76.8)
Platelets: 118 10*3/uL — ABNORMAL LOW (ref 145–400)
WBC: 4.9 10*3/uL (ref 3.9–10.3)

## 2012-05-07 MED ORDER — SODIUM CHLORIDE 0.9 % IV SOLN
INTRAVENOUS | Status: DC
  Administered 2012-05-07: 14:00:00 via INTRAVENOUS

## 2012-05-07 MED ORDER — HEPARIN SOD (PORK) LOCK FLUSH 100 UNIT/ML IV SOLN
500.0000 [IU] | Freq: Once | INTRAVENOUS | Status: AC
  Administered 2012-05-07: 500 [IU] via INTRAVENOUS
  Filled 2012-05-07: qty 5

## 2012-05-07 MED ORDER — SODIUM CHLORIDE 0.9 % IJ SOLN
10.0000 mL | INTRAMUSCULAR | Status: DC | PRN
  Administered 2012-05-07: 10 mL via INTRAVENOUS
  Filled 2012-05-07: qty 10

## 2012-05-07 NOTE — Patient Instructions (Addendum)
Dehydration, Adult Dehydration is when you lose more fluids from the body than you take in. Vital organs like the kidneys, brain, and heart cannot function without a proper amount of fluids and salt. Any loss of fluids from the body can cause dehydration.  CAUSES   Vomiting.  Diarrhea.  Excessive sweating.  Excessive urine output.  Fever. SYMPTOMS  Mild dehydration  Thirst.  Dry lips.  Slightly dry mouth. Moderate dehydration  Very dry mouth.  Sunken eyes.  Skin does not bounce back quickly when lightly pinched and released.  Dark urine and decreased urine production.  Decreased tear production.  Headache. Severe dehydration  Very dry mouth.  Extreme thirst.  Rapid, weak pulse (more than 100 beats per minute at rest).  Cold hands and feet.  Not able to sweat in spite of heat and temperature.  Rapid breathing.  Blue lips.  Confusion and lethargy.  Difficulty being awakened.  Minimal urine production.  No tears. DIAGNOSIS  Your caregiver will diagnose dehydration based on your symptoms and your exam. Blood and urine tests will help confirm the diagnosis. The diagnostic evaluation should also identify the cause of dehydration. TREATMENT  Treatment of mild or moderate dehydration can often be done at home by increasing the amount of fluids that you drink. It is best to drink small amounts of fluid more often. Drinking too much at one time can make vomiting worse. Refer to the home care instructions below. Severe dehydration needs to be treated at the hospital where you will probably be given intravenous (IV) fluids that contain water and electrolytes. HOME CARE INSTRUCTIONS   Ask your caregiver about specific rehydration instructions.  Drink enough fluids to keep your urine clear or pale yellow.  Drink small amounts frequently if you have nausea and vomiting.  Eat as you normally do.  Avoid:  Foods or drinks high in sugar.  Carbonated  drinks.  Juice.  Extremely hot or cold fluids.  Drinks with caffeine.  Fatty, greasy foods.  Alcohol.  Tobacco.  Overeating.  Gelatin desserts.  Wash your hands well to avoid spreading bacteria and viruses.  Only take over-the-counter or prescription medicines for pain, discomfort, or fever as directed by your caregiver.  Ask your caregiver if you should continue all prescribed and over-the-counter medicines.  Keep all follow-up appointments with your caregiver. SEEK MEDICAL CARE IF:  You have abdominal pain and it increases or stays in one area (localizes).  You have a rash, stiff neck, or severe headache.  You are irritable, sleepy, or difficult to awaken.  You are weak, dizzy, or extremely thirsty. SEEK IMMEDIATE MEDICAL CARE IF:   You are unable to keep fluids down or you get worse despite treatment.  You have frequent episodes of vomiting or diarrhea.  You have blood or green matter (bile) in your vomit.  You have blood in your stool or your stool looks black and tarry.  You have not urinated in 6 to 8 hours, or you have only urinated a small amount of very dark urine.  You have a fever.  You faint. MAKE SURE YOU:   Understand these instructions.  Will watch your condition.  Will get help right away if you are not doing well or get worse. Document Released: 02/21/2005 Document Revised: 05/16/2011 Document Reviewed: 10/11/2010 Exit[employer] Patient Information 2013 [employer].  

## 2012-05-07 NOTE — Progress Notes (Signed)
Pt came in for d8 lab and RN assessment post chemo last week.  Lab results reviewed and discussed.  Pt denies any vomiting or diarrhea post treatment, Able to eat and drink well.  Only complaint is noted " dizziness which started up Saturday ".  Per visit pt got up to leave and experianced severe weakness in knees with dizziness.  This RN obtained orthostatic bp and pulse readings with noted changes.  Discussed with AB/PA and arranged for IV over 2 hours.

## 2012-05-08 ENCOUNTER — Other Ambulatory Visit: Payer: Self-pay | Admitting: Physician Assistant

## 2012-05-08 ENCOUNTER — Other Ambulatory Visit: Payer: Medicare Other | Admitting: Lab

## 2012-05-08 ENCOUNTER — Ambulatory Visit: Payer: Medicare Other | Admitting: Physician Assistant

## 2012-05-09 ENCOUNTER — Telehealth: Payer: Self-pay | Admitting: Oncology

## 2012-05-09 NOTE — Telephone Encounter (Signed)
Moved 3/24 appt from AB to GM @ 10:30am - poof. S/w pt re change and new time for 10am. Pt will get new schedule when she comes in on 3/17.

## 2012-05-14 ENCOUNTER — Other Ambulatory Visit: Payer: Self-pay | Admitting: *Deleted

## 2012-05-14 DIAGNOSIS — C569 Malignant neoplasm of unspecified ovary: Secondary | ICD-10-CM

## 2012-05-14 DIAGNOSIS — K219 Gastro-esophageal reflux disease without esophagitis: Secondary | ICD-10-CM

## 2012-05-14 MED ORDER — OMEPRAZOLE 40 MG PO CPDR: 40.0000 mg | DELAYED_RELEASE_CAPSULE | Freq: Every day | ORAL | Status: DC

## 2012-05-21 ENCOUNTER — Other Ambulatory Visit (HOSPITAL_BASED_OUTPATIENT_CLINIC_OR_DEPARTMENT_OTHER): Payer: Medicare Other | Admitting: Lab

## 2012-05-21 ENCOUNTER — Ambulatory Visit (HOSPITAL_BASED_OUTPATIENT_CLINIC_OR_DEPARTMENT_OTHER): Payer: Medicare Other | Admitting: Oncology

## 2012-05-21 ENCOUNTER — Ambulatory Visit (HOSPITAL_BASED_OUTPATIENT_CLINIC_OR_DEPARTMENT_OTHER): Payer: Medicare Other

## 2012-05-21 VITALS — BP 109/63 | HR 62 | Temp 97.4°F | Resp 17

## 2012-05-21 VITALS — BP 120/70 | HR 93 | Temp 98.4°F | Resp 20 | Ht 66.0 in | Wt 155.5 lb

## 2012-05-21 DIAGNOSIS — R5381 Other malaise: Secondary | ICD-10-CM

## 2012-05-21 DIAGNOSIS — C569 Malignant neoplasm of unspecified ovary: Secondary | ICD-10-CM

## 2012-05-21 DIAGNOSIS — E860 Dehydration: Secondary | ICD-10-CM

## 2012-05-21 DIAGNOSIS — Z5111 Encounter for antineoplastic chemotherapy: Secondary | ICD-10-CM

## 2012-05-21 LAB — COMPREHENSIVE METABOLIC PANEL (CC13)
ALT: 18 U/L (ref 0–55)
Albumin: 3.3 g/dL — ABNORMAL LOW (ref 3.5–5.0)
CO2: 25 mEq/L (ref 22–29)
Calcium: 9.9 mg/dL (ref 8.4–10.4)
Chloride: 105 mEq/L (ref 98–107)
Glucose: 105 mg/dl — ABNORMAL HIGH (ref 70–99)
Potassium: 4.4 mEq/L (ref 3.5–5.1)
Sodium: 140 mEq/L (ref 136–145)
Total Bilirubin: 0.37 mg/dL (ref 0.20–1.20)
Total Protein: 6.4 g/dL (ref 6.4–8.3)

## 2012-05-21 LAB — CBC WITH DIFFERENTIAL/PLATELET
BASO%: 0.3 % (ref 0.0–2.0)
Basophils Absolute: 0 10*3/uL (ref 0.0–0.1)
EOS%: 0.4 % (ref 0.0–7.0)
HCT: 33.9 % — ABNORMAL LOW (ref 34.8–46.6)
LYMPH%: 22.8 % (ref 14.0–49.7)
MCH: 35.8 pg — ABNORMAL HIGH (ref 25.1–34.0)
MCHC: 34.3 g/dL (ref 31.5–36.0)
MONO#: 0.3 10*3/uL (ref 0.1–0.9)
NEUT%: 65.4 % (ref 38.4–76.8)
Platelets: 89 10*3/uL — ABNORMAL LOW (ref 145–400)

## 2012-05-21 LAB — CA 125: CA 125: 91.1 U/mL — ABNORMAL HIGH (ref 0.0–30.2)

## 2012-05-21 MED ORDER — CARBOPLATIN CHEMO INTRADERMAL TEST DOSE 100MCG/0.02ML
100.0000 ug | Freq: Once | INTRADERMAL | Status: AC
  Administered 2012-05-21: 100 ug via INTRADERMAL
  Filled 2012-05-21: qty 0.01

## 2012-05-21 MED ORDER — SODIUM CHLORIDE 0.9 % IJ SOLN
10.0000 mL | INTRAMUSCULAR | Status: DC | PRN
  Administered 2012-05-21: 10 mL
  Filled 2012-05-21: qty 10

## 2012-05-21 MED ORDER — HEPARIN SOD (PORK) LOCK FLUSH 100 UNIT/ML IV SOLN
500.0000 [IU] | Freq: Once | INTRAVENOUS | Status: AC | PRN
  Administered 2012-05-21: 500 [IU]
  Filled 2012-05-21: qty 5

## 2012-05-21 MED ORDER — SODIUM CHLORIDE 0.9 % IV SOLN
280.0000 mg | Freq: Once | INTRAVENOUS | Status: AC
  Administered 2012-05-21: 280 mg via INTRAVENOUS
  Filled 2012-05-21: qty 28

## 2012-05-21 MED ORDER — DEXAMETHASONE SODIUM PHOSPHATE 4 MG/ML IJ SOLN
20.0000 mg | Freq: Once | INTRAMUSCULAR | Status: AC
  Administered 2012-05-21: 20 mg via INTRAVENOUS

## 2012-05-21 MED ORDER — SODIUM CHLORIDE 0.9 % IV SOLN: Freq: Once | INTRAVENOUS | Status: DC

## 2012-05-21 MED ORDER — ONDANSETRON 16 MG/50ML IVPB (CHCC)
16.0000 mg | Freq: Once | INTRAVENOUS | Status: AC
  Administered 2012-05-21: 16 mg via INTRAVENOUS

## 2012-05-21 MED ORDER — SODIUM CHLORIDE 0.9 % IV SOLN
Freq: Once | INTRAVENOUS | Status: AC
  Administered 2012-05-21: 11:00:00 via INTRAVENOUS

## 2012-05-21 NOTE — Progress Notes (Signed)
ID: Regina West   DOB: September 14, 1929  MR#: [id number]  WUJ#:[id number]  PCP: Minda Meo, MD GYN: SU: Ivar Drape OTHER MD: Porfirio Mylar Dohmeier  HISTORY OF PRESENT ILLNESS: The patient originally presented in the summer of 2010 with cramps and abdominal distension.  I do not have a copy of the initial evaluation, but on November 26, 2008, the patient underwent optimal debulking with bilateral salpingo-oophorectomy, omentectomy, and the placement of an intraperitoneal port.  The pathology from that procedure (Accession Number [id number] at [facility]) showed first of all, significant involvement of the omentum, minimal involvement of the right ovary and right fallopian tube and negative on the left ovary and fallopian tube, with neither ovary being enlarged, consistent with a primary peritoneal serous adenocarcinoma, described as moderately differentiated.  The sample included no lymph nodes.    The patient had an intraperitoneal port placed in the same surgery and was treated with intraperitoneal and IV chemotherapy according to GOG-252 but I am not sure which arm she was in.  (Arm 2 did carboplatin intraperitoneally and Taxol IV.  Arm 3 did cisplatin and paclitaxel intraperitoneally with paclitaxel IV.)  All arms received bevacizumab.  Unfortunately, after 4 cycles of treatment, she had acute mental status changes and was admitted here January of 2011 with what proved to be posterior reversible leukoencephalopathy and SIADH.  She had seizures, aphasia, and required intubation.  Once the patient recovered from this, she was treated with 2 cycles of single-agent carboplatin (I do not have the AUC). Her last adjuvant treatment was May 12, 2009, and her CA-125 at that time was 10.3.  Her intraperitoneal port was removed in April of 2011.    More recently, the patient was in routine follow up when her CA-125 was found to jump up to 2,269.7 (March 26, 2010).  This is 10 months after her last prior chemotherapy.   She had CTs of the chest, abdomen and pelvis January 26th which showed ascites and enhancing peritoneal nodularity. She had had similar findings at presentation but these had completely resolved by the time she finished treatment in March of 2011. The patient was felt to be in first relapse. Subsequent treatments are as summarized below   INTERVAL HISTORY:  Sherman returns today with her daughter Regina West for followup of Regina West's ovarian cancer. Today is day 1 cycle 2 of her every 21 day carboplatin treatments  REVIEW OF SYSTEMS:  She felt progressively weaker over several days after her first cycle, and also dehydrated. She had no nausea vomiting, she just "didn't remember 2 drinks". She came back on day 8 and have IV fluids and that helped considerably. She continues to have mild urinary incontinence, but no constipation, back pain, abdominal swelling, or other problems suggestive of disease progression. She feels forgetful and a little depressed. A detailed review of systems today was otherwise noncontributory  PAST MEDICAL HISTORY: Significant for hysterectomy at the age of 64 with concurrent bladder "tuck up."  She also underwent appendectomy during that procedure.  She is status post prior bilateral cataract surgery. She is status post tonsillectomy and adenoidectomy.  She has a history of hypertension and diabetes. She has a history of diverticulosis.  She has a large hiatal hernia.  She has degenerative disk disease.  She has been noted to have coronary calcifications and she has hyperlipidemia.  There is a history of remote tobacco abuse. Seizure disorder as per HPI  FAMILY HISTORY The patient's father died at the age of 65 after heart surgery  for aortic stenosis.  The patient's mother died at the age of 88.  The patient has one brother in good health.  There is no other family history to her knowledge of ovarian or breast cancer.    GYNECOLOGIC HISTORY: Menarche at age 30.  She is GX P3 with  menopause in her late 61s.  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  Her husband was in sales and had a history of Parkinson's disease.  He died following a fall in the year 2000.  The patient's son Regina West lives in [place], and has a history of MS.  Daughter Regina West lives in [place]. She is a Futures trader.  Son Regina West lives in [place], [place] and is in Airline pilot. The patient has 8 grandchildren. She attends the [employer].  She is a good friend of our patients [name]   ADVANCED DIRECTIVES: in place  HEALTH MAINTENANCE: History  Substance Use Topics  . Smoking status: Former Smoker    Quit date: 05/24/1959  . Smokeless tobacco: Never Used  . Alcohol Use: No     Colonoscopy: 2008  PAP: UTD  MM: refuses  Allergies  Allergen Reactions  . Avastin (Bevacizumab) Other (See Comments)    Swelling of the brain     Current Outpatient Prescriptions  Medication Sig Dispense Refill  . acyclovir (ZOVIRAX) 400 MG tablet Take 1 tablet (400 mg total) by mouth 2 (two) times daily.  60 tablet  2  . antiseptic oral rinse (BIOTENE) LIQD 15 mLs by Mouth Rinse route 2 (two) times daily.  1 Bottle  0  . cholecalciferol (VITAMIN D) 1000 UNITS tablet Take 1,000 Units by mouth 2 (two) times daily.      Marland Kitchen dexamethasone (DECADRON) 4 MG tablet Take 2 tablets (8 mg total) by mouth 2 (two) times daily with a meal. Take two times a day starting the day after chemotherapy for 3 days.  30 tablet  1  . Epinastine HCl 0.05 % ophthalmic solution       . escitalopram (LEXAPRO) 10 MG tablet       . levETIRAcetam (KEPPRA) 500 MG tablet Take 250 mg by mouth every 12 (twelve) hours. Pt takes 1/2 tab for 250 mg dose      . lidocaine-prilocaine (EMLA) cream Apply 1 application topically as needed.      Marland Kitchen LOTEMAX 0.5 % OINT       . metoCLOPramide (REGLAN) 5 MG tablet Take 1 tablet (5 mg  total) by mouth 3 (three) times daily before meals.  90 tablet  2  . mirtazapine (REMERON SOL-TAB) 15 MG disintegrating tablet Take 1 tablet (15 mg total) by mouth at bedtime.  30 tablet  3  . neomycin-polymyxin b-dexamethasone (MAXITROL) 3.5-10000-0.1 OINT       . nitrofurantoin, macrocrystal-monohydrate, (MACROBID) 100 MG capsule Take 1 tablet bid x 3 days then 1 tablet daily indifently  30 capsule  6  . omeprazole (PRILOSEC) 40 MG capsule Take 1 capsule (40 mg total) by mouth at bedtime.  30 capsule  2  . ondansetron (ZOFRAN) 8 MG tablet Take 1 tablet (8 mg total) by mouth 2 (two) times daily. Take two times a day starting the day after chemo for 3 days. Then take two times a day as needed for nausea or vomiting.  30 tablet  1  . ondansetron (ZOFRAN-ODT) 8  MG disintegrating tablet Take 1 tablet (8 mg total) by mouth every 8 (eight) hours as needed. nausea  20 tablet  3  . prochlorperazine (COMPAZINE) 10 MG tablet Take 1 tablet (10 mg total) by mouth every 6 (six) hours as needed.  30 tablet  0  . prochlorperazine (COMPAZINE) 10 MG tablet Take 1 tablet (10 mg total) by mouth every 6 (six) hours as needed.  30 tablet  3  . prochlorperazine (COMPAZINE) 25 MG suppository Place 1 suppository (25 mg total) rectally every 12 (twelve) hours as needed for nausea.  12 suppository  3   Current Facility-Administered Medications  Medication Dose Route Frequency Provider Last Rate Last Dose  . 0.9 %  sodium chloride infusion   Intravenous Once Lowella Dell, MD        OBJECTIVE: Elderly white woman in no acute distress  Filed Vitals:   05/21/12 0846  BP: 120/70  Pulse: 93  Temp: 98.4 F (36.9 C)  Resp: 20     Body mass index is 25.11 kg/(m^2).    ECOG FS: 1 Filed Weights   05/21/12 0846  Weight: 155 lb 8 oz (70.534 kg)    Sclerae unicteric Oropharynx clear No cervical or supraclavicular adenopathy Lungs no rales or rhonchi Heart regular rate and rhythm Abd soft, notdistended, positive  bowel sounds, no masses palpated, no tenderness to palpation MSK no focal spinal tenderness, no peripheral edema Neuro: nonfocal, positive affect, well oriented Breasts: Deferred  LAB RESULTS: Lab Results  Component Value Date   CA125 139.3* 04/27/2012   Lab Results  Component Value Date   WBC 3.0* 05/21/2012   NEUTROABS 2.0 05/21/2012   HGB 11.6 05/21/2012   HCT 33.9* 05/21/2012   MCV 104.3* 05/21/2012   PLT 89* 05/21/2012      Chemistry      Component Value Date/Time   NA 140 04/30/2012 1103   NA 136 10/17/2011 1118   NA 143 05/12/2011 1337   K 4.2 04/30/2012 1103   K 3.9 10/17/2011 1118   K 4.1 05/12/2011 1337   CL 103 04/30/2012 1103   CL 100 10/17/2011 1118   CL 92* 05/12/2011 1337   CO2 27 04/30/2012 1103   CO2 27 10/17/2011 1118   CO2 32 05/12/2011 1337   BUN 25.4 04/30/2012 1103   BUN 25* 10/17/2011 1118   BUN 19 05/12/2011 1337   CREATININE 1.4* 04/30/2012 1103   CREATININE 1.10 10/17/2011 1118   CREATININE 1.3* 05/12/2011 1337      Component Value Date/Time   CALCIUM 9.6 04/30/2012 1103   CALCIUM 9.8 10/17/2011 1118   CALCIUM 9.8 05/12/2011 1337   ALKPHOS 106 04/30/2012 1103   ALKPHOS 109 10/17/2011 1118   ALKPHOS 95* 05/12/2011 1337   AST 20 04/30/2012 1103   AST 22 10/17/2011 1118   AST 40* 05/12/2011 1337   ALT 15 04/30/2012 1103   ALT 14 10/17/2011 1118   BILITOT 0.38 04/30/2012 1103   BILITOT 0.4 10/17/2011 1118   BILITOT 0.60 05/12/2011 1337       STUDIES: Dg Chest 2 View  04/13/2012  *RADIOLOGY REPORT*  Clinical Data: 79 year old female with history of ovarian cancer.  CHEST - 2 VIEW  Comparison: 10/31/2011 and earlier.  Findings: Chronic moderate to large hiatal hernia.  Contrast level in the hernia today. Other mediastinal contours are within normal limits.  Stable right chest Port-A-Cath.  No pulmonary nodule or mass identified.  Decreased mild atelectasis or scarring at the left costophrenic angle.  No pneumothorax, pulmonary edema, pleural effusion or acute pulmonary opacity.  Stable visualized osseous structures.  IMPRESSION: No acute cardiopulmonary abnormality.   Original Report Authenticated By: Erskine Speed, M.D.    Ct Abdomen Pelvis W Contrast  04/13/2012  **ADDENDUM** CREATED: 04/13/2012 15:39:04  Extensive colonic diverticulosis as detailed above.  Apparent wall thickening in the sigmoid which may be due to underdistension or muscular hypertrophy.  Correlate with any left lower quadrant pain, as focal diverticulitis cannot be entirely excluded. Recommend attention on follow-up.  **END ADDENDUM** SIGNED BY: Karn Cassis. Reche Dixon, M.D.   04/13/2012  *RADIOLOGY REPORT*  Clinical Data: Ovarian cancer diagnosed 4 years ago with chemotherapy completed 8/13.  Hysterectomy.  CT ABDOMEN AND PELVIS WITH CONTRAST  Technique:  Multidetector CT imaging of the abdomen and pelvis was performed following the standard protocol during bolus administration of intravenous contrast.  Contrast: OMNIPAQUE IOHEXOL 300 MG/ML  SOLN  Comparison: 10/31/2011  Findings: Lung bases:  Atelectasis at the anterior left lung base. Borderline cardiomegaly, without pericardial or pleural effusion.  A large hiatal hernia, similar.  Abdomen/pelvis:  Similar high density focus along the capsule of the right lobe of the liver on image 27/series 2.  Possibly dystrophic.  No intraparenchymal liver lesions.  Normal spleen, distal stomach.  Moderate pancreatic atrophy.  Normal gallbladder, biliary tract.  Normal right adrenal gland with minimal left adrenal nodularity, unchanged.  A left extrarenal pelvis.  Normal right kidney.  Retroperitoneal nodes are small and similar.  Similar appearance of soft tissue density along the left periaortic space on image 21/series 2.  Moderate stool within the rectum.  Extensive colonic diverticulosis.  Apparent wall thickening within the sigmoid on image 68/series 2. This could relate to underdistension or muscular hypertrophy.Normal terminal ileum.  Small bowel loops measure up to 3.3 cm on  image 45/series 2 in the left side of the abdomen.  The immediately distal small bowel is more normal in caliber, including on image 60/series 2.    There are anteriorly positioned small bowel loops throughout, suggesting adhesions.  Subtle peritoneal/omental nodularity is suspected.  Example image 11/series 2 left upper quadrant and anterior to the splenic flexure colon image 16/series 2.  Absent on the prior.  There is mild fascial thickening in the right anterior upper pelvis on image 49/series 2. Not readily apparent on the prior.  Mild right internal iliac dilatation with wall thrombus within image 58/series 2. No pelvic adenopathy.    Normal urinary bladder. Hysterectomy.  No adnexal mass.  The low density lesion described on the prior exam within the right central pelvis is no longer present. No significant free fluid.  Bones/Musculoskeletal:  No acute osseous abnormality. Thoracolumbar spondylosis with areas of trace retrolisthesis, likely secondary.  IMPRESSION: 1.  Subtle findings, including developing peritoneal/omental nodularity and serosal fascial thickening, which are suspicious for recurrent peritoneal disease. PET may be informative. 2.  Probable adhesions, with anterior position of small bowel loops.  Mildly dilated loop of mid small bowel for which low grade obstruction cannot be excluded.  Correlate with symptoms to suggest a low grade bowel obstruction. 3.  Similar soft tissue fullness along the left diaphragmatic crura; resolution of tiny low density lesions within the right central pelvis since the prior. 4. Large hiatal hernia, similar.   Original Report Authenticated By: Jeronimo Greaves, M.D.   .    ASSESSMENT: 79 y.o.  [place] woman   (1) status post optimal debulking of a primary peritoneal serous adenocarcinoma September 2010, the tumor  being moderately differentiated, pT3c NX (stage IIIC), treated according to GOG 252 with  intraperitoneal platinum and paclitaxel x4 given along with  bevacizumab, complicated by SIADHafter cycle 4, also with posterior reversible leukoencephalopathy.  After these problems resolved she received 2 additional cycles of single-agent carboplatin completed in March 2011  (2)  She had her first recurrence February 2012  treated with carboplatin and Gemzar for a total of 6 cycles between February and June 2012.  Her CEA-125 had normalized before the beginning of cycle 5.   (3) second recurrence documented February 2013,  treated with carboplatin/ doxil x 6, completed 10/03/2011, with a good response noted on restaging scans  (4) third recurrence documented February 2014, receiving single-agent carboplatin every 3 weeks  PLAN:  Her platelets are not quite up to 100,000, so we will proceed with treatment today but I will cut the dose by 10%. I am also adding IV fluids for later this week (her daughter will be with her on Friday so she can easily bring her in on March 21). Hopefully that will take care of the dehydration and weakness problem. She had no nausea or vomiting and otherwise did well with the treatment. We will check a CA 125 of today and again in 3 weeks. If we can document her response after 2 cycles of we will continue, otherwise we will consider weekly paclitaxel. MAGRINAT,GUSTAV C    05/21/2012

## 2012-05-21 NOTE — Patient Instructions (Signed)
Patient aware of next appointment; discharged home with no complaints. 

## 2012-05-25 ENCOUNTER — Other Ambulatory Visit: Payer: Self-pay | Admitting: Physician Assistant

## 2012-05-25 ENCOUNTER — Ambulatory Visit (HOSPITAL_BASED_OUTPATIENT_CLINIC_OR_DEPARTMENT_OTHER): Payer: Medicare Other

## 2012-05-25 VITALS — BP 142/84 | HR 59 | Temp 98.2°F | Resp 17

## 2012-05-25 DIAGNOSIS — E860 Dehydration: Secondary | ICD-10-CM

## 2012-05-25 DIAGNOSIS — C569 Malignant neoplasm of unspecified ovary: Secondary | ICD-10-CM

## 2012-05-25 MED ORDER — SODIUM CHLORIDE 0.45 % IV SOLN
Freq: Once | INTRAVENOUS | Status: AC
  Administered 2012-05-25: 11:00:00 via INTRAVENOUS
  Filled 2012-05-25: qty 1000

## 2012-05-25 NOTE — Patient Instructions (Addendum)
Dehydration, Adult Dehydration is when you lose more fluids from the body than you take in. Vital organs like the kidneys, brain, and heart cannot function without a proper amount of fluids and salt. Any loss of fluids from the body can cause dehydration.  CAUSES   Vomiting.  Diarrhea.  Excessive sweating.  Excessive urine output.  Fever. SYMPTOMS  Mild dehydration  Thirst.  Dry lips.  Slightly dry mouth. Moderate dehydration  Very dry mouth.  Sunken eyes.  Skin does not bounce back quickly when lightly pinched and released.  Dark urine and decreased urine production.  Decreased tear production.  Headache. Severe dehydration  Very dry mouth.  Extreme thirst.  Rapid, weak pulse (more than 100 beats per minute at rest).  Cold hands and feet.  Not able to sweat in spite of heat and temperature.  Rapid breathing.  Blue lips.  Confusion and lethargy.  Difficulty being awakened.  Minimal urine production.  No tears. DIAGNOSIS  Your caregiver will diagnose dehydration based on your symptoms and your exam. Blood and urine tests will help confirm the diagnosis. The diagnostic evaluation should also identify the cause of dehydration. TREATMENT  Treatment of mild or moderate dehydration can often be done at home by increasing the amount of fluids that you drink. It is best to drink small amounts of fluid more often. Drinking too much at one time can make vomiting worse. Refer to the home care instructions below. Severe dehydration needs to be treated at the hospital where you will probably be given intravenous (IV) fluids that contain water and electrolytes. HOME CARE INSTRUCTIONS   Ask your caregiver about specific rehydration instructions.  Drink enough fluids to keep your urine clear or pale yellow.  Drink small amounts frequently if you have nausea and vomiting.  Eat as you normally do.  Avoid:  Foods or drinks high in sugar.  Carbonated  drinks.  Juice.  Extremely hot or cold fluids.  Drinks with caffeine.  Fatty, greasy foods.  Alcohol.  Tobacco.  Overeating.  Gelatin desserts.  Wash your hands well to avoid spreading bacteria and viruses.  Only take over-the-counter or prescription medicines for pain, discomfort, or fever as directed by your caregiver.  Ask your caregiver if you should continue all prescribed and over-the-counter medicines.  Keep all follow-up appointments with your caregiver. SEEK MEDICAL CARE IF:  You have abdominal pain and it increases or stays in one area (localizes).  You have a rash, stiff neck, or severe headache.  You are irritable, sleepy, or difficult to awaken.  You are weak, dizzy, or extremely thirsty. SEEK IMMEDIATE MEDICAL CARE IF:   You are unable to keep fluids down or you get worse despite treatment.  You have frequent episodes of vomiting or diarrhea.  You have blood or green matter (bile) in your vomit.  You have blood in your stool or your stool looks black and tarry.  You have not urinated in 6 to 8 hours, or you have only urinated a small amount of very dark urine.  You have a fever.  You faint. MAKE SURE YOU:   Understand these instructions.  Will watch your condition.  Will get help right away if you are not doing well or get worse. Document Released: 02/21/2005 Document Revised: 05/16/2011 Document Reviewed: 10/11/2010 Exit[employer] Patient Information 2013 [employer].  

## 2012-05-25 NOTE — Progress Notes (Signed)
1305 VSS. Patient with dizziness, or other complaints. Discharged ambulating with daughter. AVS provided. Patient knows to call MD for any questions or concerns.

## 2012-05-28 ENCOUNTER — Other Ambulatory Visit (HOSPITAL_BASED_OUTPATIENT_CLINIC_OR_DEPARTMENT_OTHER): Payer: Medicare Other | Admitting: Lab

## 2012-05-28 ENCOUNTER — Telehealth: Payer: Self-pay | Admitting: *Deleted

## 2012-05-28 ENCOUNTER — Ambulatory Visit (HOSPITAL_BASED_OUTPATIENT_CLINIC_OR_DEPARTMENT_OTHER): Payer: Medicare Other | Admitting: Oncology

## 2012-05-28 VITALS — BP 135/78 | HR 87 | Temp 98.0°F | Resp 20 | Ht 66.0 in | Wt 154.4 lb

## 2012-05-28 DIAGNOSIS — F3289 Other specified depressive episodes: Secondary | ICD-10-CM

## 2012-05-28 DIAGNOSIS — R5381 Other malaise: Secondary | ICD-10-CM

## 2012-05-28 DIAGNOSIS — C482 Malignant neoplasm of peritoneum, unspecified: Secondary | ICD-10-CM

## 2012-05-28 DIAGNOSIS — C569 Malignant neoplasm of unspecified ovary: Secondary | ICD-10-CM

## 2012-05-28 DIAGNOSIS — F329 Major depressive disorder, single episode, unspecified: Secondary | ICD-10-CM

## 2012-05-28 LAB — CBC WITH DIFFERENTIAL/PLATELET
BASO%: 0.1 % (ref 0.0–2.0)
Basophils Absolute: 0 10*3/uL (ref 0.0–0.1)
EOS%: 0.4 % (ref 0.0–7.0)
Eosinophils Absolute: 0 10*3/uL (ref 0.0–0.5)
HCT: 33.7 % — ABNORMAL LOW (ref 34.8–46.6)
HGB: 11.8 g/dL (ref 11.6–15.9)
LYMPH%: 24.2 % (ref 14.0–49.7)
MCH: 36.1 pg — ABNORMAL HIGH (ref 25.1–34.0)
MCHC: 35.1 g/dL (ref 31.5–36.0)
MCV: 102.6 fL — ABNORMAL HIGH (ref 79.5–101.0)
MONO#: 0.5 10*3/uL (ref 0.1–0.9)
MONO%: 14.6 % — ABNORMAL HIGH (ref 0.0–14.0)
NEUT#: 2.2 10*3/uL (ref 1.5–6.5)
NEUT%: 60.7 % (ref 38.4–76.8)
Platelets: 157 10*3/uL (ref 145–400)
RBC: 3.28 10*6/uL — ABNORMAL LOW (ref 3.70–5.45)
RDW: 13.2 % (ref 11.2–14.5)
WBC: 3.6 10*3/uL — ABNORMAL LOW (ref 3.9–10.3)
lymph#: 0.9 10*3/uL (ref 0.9–3.3)

## 2012-05-28 LAB — COMPREHENSIVE METABOLIC PANEL (CC13)
AST: 21 U/L (ref 5–34)
Albumin: 3.2 g/dL — ABNORMAL LOW (ref 3.5–5.0)
Alkaline Phosphatase: 118 U/L (ref 40–150)
BUN: 21.6 mg/dL (ref 7.0–26.0)
Calcium: 9.7 mg/dL (ref 8.4–10.4)
Creatinine: 1.3 mg/dL — ABNORMAL HIGH (ref 0.6–1.1)
Glucose: 179 mg/dl — ABNORMAL HIGH (ref 70–99)
Potassium: 3.9 mEq/L (ref 3.5–5.1)

## 2012-05-28 NOTE — Telephone Encounter (Signed)
appts made and printed 

## 2012-05-28 NOTE — Progress Notes (Signed)
ID: Regina West   DOB: 08-11-1929  MR#: [id number]  WUJ#:[id number]  PCP: Minda Meo, MD GYN: SU: Ivar Drape OTHER MD: Porfirio Mylar Dohmeier  HISTORY OF PRESENT ILLNESS: The patient originally presented in the summer of 2010 with cramps and abdominal distension.  I do not have a copy of the initial evaluation, but on November 26, 2008, the patient underwent optimal debulking with bilateral salpingo-oophorectomy, omentectomy, and the placement of an intraperitoneal port.  The pathology from that procedure (Accession Number [id number] at [facility]) showed first of all, significant involvement of the omentum, minimal involvement of the right ovary and right fallopian tube and negative on the left ovary and fallopian tube, with neither ovary being enlarged, consistent with a primary peritoneal serous adenocarcinoma, described as moderately differentiated.  The sample included no lymph nodes.    The patient had an intraperitoneal port placed in the same surgery and was treated with intraperitoneal and IV chemotherapy according to GOG-252 but I am not sure which arm she was in.  (Arm 2 did carboplatin intraperitoneally and Taxol IV.  Arm 3 did cisplatin and paclitaxel intraperitoneally with paclitaxel IV.)  All arms received bevacizumab.  Unfortunately, after 4 cycles of treatment, she had acute mental status changes and was admitted here January of 2011 with what proved to be posterior reversible leukoencephalopathy and SIADH.  She had seizures, aphasia, and required intubation.  Once the patient recovered from this, she was treated with 2 cycles of single-agent carboplatin (I do not have the AUC). Her last adjuvant treatment was May 12, 2009, and her CA-125 at that time was 10.3.  Her intraperitoneal port was removed in April of 2011.    More recently, the patient was in routine follow up when her CA-125 was found to jump up to 2,269.7 (March 26, 2010).  This is 10 months after her last prior chemotherapy.   She had CTs of the chest, abdomen and pelvis January 26th which showed ascites and enhancing peritoneal nodularity. She had had similar findings at presentation but these had completely resolved by the time she finished treatment in March of 2011. The patient was felt to be in first relapse. Subsequent treatments are as summarized below   INTERVAL HISTORY:  Regina West returns today with her daughter Regina West for followup of Regina West's ovarian cancer. Today is day 8 cycle 2 of her every 21 day carboplatin treatments  REVIEW OF SYSTEMS:  She felt weak achy and but did much better when we added intravenous fluids on the Friday. Unfortunately her daughter can only driver here on Mondays and Fridays. If she could get a ride on Wednesdays we could give her fluids on day 3 as well, which I think would help her fatigue. She feels moderately depressed. She did not think this treatment would be a stressful as it is proving to be for her, but she felt better after she heard that the numbers are looking up (as documented below. She has had no nausea or vomiting of, and she is benefiting from physical therapy. A detailed review of systems today was otherwise stable.  PAST MEDICAL HISTORY: Significant for hysterectomy at the age of 57 with concurrent bladder "tuck up."  She also underwent appendectomy during that procedure.  She is status post prior bilateral cataract surgery. She is status post tonsillectomy and adenoidectomy.  She has a history of hypertension and diabetes. She has a history of diverticulosis.  She has a large hiatal hernia.  She has degenerative disk disease.  She has  been noted to have coronary calcifications and she has hyperlipidemia.  There is a history of remote tobacco abuse. Seizure disorder as per HPI  FAMILY HISTORY The patient's father died at the age of 19 after heart surgery for aortic stenosis.  The patient's mother died at the age of 23.  The patient has one brother in good health.  There is  no other family history to her knowledge of ovarian or breast cancer.    GYNECOLOGIC HISTORY: Menarche at age 35.  She is GX P3 with menopause in her late 16s.  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  Her husband was in sales and had a history of Parkinson's disease.  He died following a fall in the year 2000.  The patient's son [name] lives in [place], and has a history of MS.  Daughter [name] lives in [place]. She is a Futures trader.  Son [name] lives in [place], [place] and is in Airline pilot. The patient has 8 grandchildren. She attends the [employer].  She is a good friend of our patients [name]   ADVANCED DIRECTIVES: in place  HEALTH MAINTENANCE: History  Substance Use Topics  . Smoking status: Former Smoker    Quit date: 05/24/1959  . Smokeless tobacco: Never Used  . Alcohol Use: No     Colonoscopy: 2008  PAP: UTD  MM: refuses  Allergies  Allergen Reactions  . Avastin (Bevacizumab) Other (See Comments)    Swelling of the brain     Current Outpatient Prescriptions  Medication Sig Dispense Refill  . acyclovir (ZOVIRAX) 400 MG tablet Take 1 tablet (400 mg total) by mouth 2 (two) times daily.  60 tablet  2  . antiseptic oral rinse (BIOTENE) LIQD 15 mLs by Mouth Rinse route 2 (two) times daily.  1 Bottle  0  . cholecalciferol (VITAMIN D) 1000 UNITS tablet Take 1,000 Units by mouth 2 (two) times daily.      Marland Kitchen dexamethasone (DECADRON) 4 MG tablet Take 2 tablets (8 mg total) by mouth 2 (two) times daily with a meal. Take two times a day starting the day after chemotherapy for 3 days.  30 tablet  1  . Epinastine HCl 0.05 % ophthalmic solution       . escitalopram (LEXAPRO) 10 MG tablet       . levETIRAcetam (KEPPRA) 500 MG tablet Take 250 mg by mouth every 12 (twelve) hours. Pt takes 1/2 tab for 250 mg dose      . lidocaine-prilocaine (EMLA) cream  Apply 1 application topically as needed.      Marland Kitchen LOTEMAX 0.5 % OINT       . metoCLOPramide (REGLAN) 5 MG tablet Take 1 tablet (5 mg total) by mouth 3 (three) times daily before meals.  90 tablet  2  . mirtazapine (REMERON SOL-TAB) 15 MG disintegrating tablet Take 1 tablet (15 mg total) by mouth at bedtime.  30 tablet  3  . neomycin-polymyxin b-dexamethasone (MAXITROL) 3.5-10000-0.1 OINT       . nitrofurantoin, macrocrystal-monohydrate, (MACROBID) 100 MG capsule Take 1 tablet bid x 3 days then 1 tablet daily indifently  30 capsule  6  . omeprazole (PRILOSEC) 40 MG capsule Take 1 capsule (40 mg total) by mouth at bedtime.  30 capsule  2  . ondansetron (ZOFRAN) 8 MG tablet Take 1 tablet (8 mg total) by mouth  2 (two) times daily. Take two times a day starting the day after chemo for 3 days. Then take two times a day as needed for nausea or vomiting.  30 tablet  1  . ondansetron (ZOFRAN-ODT) 8 MG disintegrating tablet Take 1 tablet (8 mg total) by mouth every 8 (eight) hours as needed. nausea  20 tablet  3  . prochlorperazine (COMPAZINE) 10 MG tablet Take 1 tablet (10 mg total) by mouth every 6 (six) hours as needed.  30 tablet  0  . prochlorperazine (COMPAZINE) 10 MG tablet Take 1 tablet (10 mg total) by mouth every 6 (six) hours as needed.  30 tablet  3  . prochlorperazine (COMPAZINE) 25 MG suppository Place 1 suppository (25 mg total) rectally every 12 (twelve) hours as needed for nausea.  12 suppository  3   No current facility-administered medications for this visit.    OBJECTIVE: Elderly white woman in no acute distress  Filed Vitals:   05/28/12 1048  BP: 135/78  Pulse: 87  Temp: 98 F (36.7 C)  Resp: 20     Body mass index is 24.93 kg/(m^2).    ECOG FS: 2 Filed Weights   05/28/12 1048  Weight: 154 lb 6.4 oz (70.035 kg)    Sclerae unicteric Oropharynx clear No cervical or supraclavicular adenopathy Lungs no rales or rhonchi Heart regular rate and rhythm Abd soft, notdistended,  positive bowel sounds, no masses palpated, no tenderness to palpation MSK no focal spinal tenderness, no peripheral edema Neuro: nonfocal, pleasant affect, well oriented Breasts: Deferred  LAB RESULTS: Lab Results  Component Value Date   CA125 91.1* 05/21/2012   Lab Results  Component Value Date   WBC 3.6* 05/28/2012   NEUTROABS 2.2 05/28/2012   HGB 11.8 05/28/2012   HCT 33.7* 05/28/2012   MCV 102.6* 05/28/2012   PLT 157 05/28/2012      Chemistry      Component Value Date/Time   NA 140 05/21/2012 0813   NA 136 10/17/2011 1118   NA 143 05/12/2011 1337   K 4.4 05/21/2012 0813   K 3.9 10/17/2011 1118   K 4.1 05/12/2011 1337   CL 105 05/21/2012 0813   CL 100 10/17/2011 1118   CL 92* 05/12/2011 1337   CO2 25 05/21/2012 0813   CO2 27 10/17/2011 1118   CO2 32 05/12/2011 1337   BUN 26.2* 05/21/2012 0813   BUN 25* 10/17/2011 1118   BUN 19 05/12/2011 1337   CREATININE 1.3* 05/21/2012 0813   CREATININE 1.10 10/17/2011 1118   CREATININE 1.3* 05/12/2011 1337      Component Value Date/Time   CALCIUM 9.9 05/21/2012 0813   CALCIUM 9.8 10/17/2011 1118   CALCIUM 9.8 05/12/2011 1337   ALKPHOS 112 05/21/2012 0813   ALKPHOS 109 10/17/2011 1118   ALKPHOS 95* 05/12/2011 1337   AST 20 05/21/2012 0813   AST 22 10/17/2011 1118   AST 40* 05/12/2011 1337   ALT 18 05/21/2012 0813   ALT 14 10/17/2011 1118   BILITOT 0.37 05/21/2012 0813   BILITOT 0.4 10/17/2011 1118   BILITOT 0.60 05/12/2011 1337     Results for CARRIEANNE, KLEEN (MRN [id number]) as of 05/28/2012 11:00  Ref. Range 10/31/2011 11:30 02/06/2012 11:37 04/09/2012 11:19 04/27/2012 10:50 05/21/2012 08:13  CA 125 Latest Range: 0.0-30.2 U/mL 11.0 18.6 79.6 (H) 139.3 (H) 91.1 (H)    STUDIES: No results found.  ASSESSMENT: 79 y.o.  [place] woman   (1) status post optimal debulking of a primary peritoneal serous  adenocarcinoma September 2010, the tumor being moderately differentiated, pT3c NX (stage IIIC), treated according to GOG 252 with  intraperitoneal platinum and  paclitaxel x4 given along with bevacizumab, complicated by SIADHafter cycle 4, also with posterior reversible leukoencephalopathy.  After these problems resolved she received 2 additional cycles of single-agent carboplatin completed in March 2011  (2)  She had her first recurrence February 2012  treated with carboplatin and Gemzar for a total of 6 cycles between February and June 2012.  Her CEA-125 had normalized before the beginning of cycle 5.   (3) second recurrence documented February 2013,  treated with carboplatin/ doxil x 6, completed 10/03/2011, with a good response noted on restaging scans  (4) third recurrence documented February 2014, receiving single-agent carboplatin every 3 weeks, first dose 04/30/2012  PLAN:  I think she is tolerating the carboplatin moderately well. We will hydrate her as needed, currently on day 5, but we could easily do it on day 3 as well as she can get herself a ride here. Meanwhile I again encouraged her to keep herself well hydrated at home. Her platelets are much better having dropped of the carbo dose minimally. The plan then is to resume treatment on April 7 as before. She knows to call for any problems that may develop before the next visit. Bobbie Virden C    05/28/2012

## 2012-05-28 NOTE — Telephone Encounter (Signed)
Per staff phone call and POF I have schedueld appts.  JMW  

## 2012-05-29 ENCOUNTER — Other Ambulatory Visit: Payer: Medicare Other | Admitting: Lab

## 2012-05-29 ENCOUNTER — Ambulatory Visit: Payer: Medicare Other | Admitting: Physician Assistant

## 2012-06-01 ENCOUNTER — Other Ambulatory Visit: Payer: Self-pay | Admitting: *Deleted

## 2012-06-01 DIAGNOSIS — C569 Malignant neoplasm of unspecified ovary: Secondary | ICD-10-CM

## 2012-06-01 MED ORDER — ACYCLOVIR 400 MG PO TABS: 400.0000 mg | ORAL_TABLET | Freq: Two times a day (BID) | ORAL | Status: DC

## 2012-06-11 ENCOUNTER — Other Ambulatory Visit: Payer: Self-pay | Admitting: Oncology

## 2012-06-11 ENCOUNTER — Other Ambulatory Visit (HOSPITAL_BASED_OUTPATIENT_CLINIC_OR_DEPARTMENT_OTHER): Payer: Medicare Other | Admitting: Lab

## 2012-06-11 ENCOUNTER — Ambulatory Visit (HOSPITAL_BASED_OUTPATIENT_CLINIC_OR_DEPARTMENT_OTHER): Payer: Medicare Other

## 2012-06-11 ENCOUNTER — Telehealth: Payer: Self-pay | Admitting: *Deleted

## 2012-06-11 ENCOUNTER — Ambulatory Visit (HOSPITAL_BASED_OUTPATIENT_CLINIC_OR_DEPARTMENT_OTHER): Payer: Medicare Other | Admitting: Oncology

## 2012-06-11 VITALS — BP 108/66 | HR 97 | Temp 97.9°F | Resp 20 | Ht 66.0 in | Wt 157.1 lb

## 2012-06-11 DIAGNOSIS — Z5111 Encounter for antineoplastic chemotherapy: Secondary | ICD-10-CM

## 2012-06-11 DIAGNOSIS — C569 Malignant neoplasm of unspecified ovary: Secondary | ICD-10-CM

## 2012-06-11 LAB — CBC WITH DIFFERENTIAL/PLATELET
Basophils Absolute: 0 10*3/uL (ref 0.0–0.1)
Eosinophils Absolute: 0 10*3/uL (ref 0.0–0.5)
HCT: 32.7 % — ABNORMAL LOW (ref 34.8–46.6)
HGB: 11.2 g/dL — ABNORMAL LOW (ref 11.6–15.9)
MCV: 101.6 fL — ABNORMAL HIGH (ref 79.5–101.0)
NEUT#: 2.6 10*3/uL (ref 1.5–6.5)
NEUT%: 67.6 % (ref 38.4–76.8)
RDW: 14.4 % (ref 11.2–14.5)
lymph#: 0.7 10*3/uL — ABNORMAL LOW (ref 0.9–3.3)

## 2012-06-11 LAB — COMPREHENSIVE METABOLIC PANEL (CC13)
Albumin: 3.3 g/dL — ABNORMAL LOW (ref 3.5–5.0)
CO2: 27 mEq/L (ref 22–29)
Glucose: 119 mg/dl — ABNORMAL HIGH (ref 70–99)
Potassium: 4.2 mEq/L (ref 3.5–5.1)
Sodium: 136 mEq/L (ref 136–145)
Total Protein: 6.4 g/dL (ref 6.4–8.3)

## 2012-06-11 MED ORDER — CARBOPLATIN CHEMO INTRADERMAL TEST DOSE 100MCG/0.02ML
100.0000 ug | Freq: Once | INTRADERMAL | Status: AC
  Administered 2012-06-11: 100 ug via INTRADERMAL
  Filled 2012-06-11: qty 0.01

## 2012-06-11 MED ORDER — SODIUM CHLORIDE 0.9 % IV SOLN
Freq: Once | INTRAVENOUS | Status: AC
  Administered 2012-06-11: 10:00:00 via INTRAVENOUS

## 2012-06-11 MED ORDER — SODIUM CHLORIDE 0.9 % IJ SOLN
10.0000 mL | INTRAMUSCULAR | Status: DC | PRN
  Administered 2012-06-11: 10 mL
  Filled 2012-06-11: qty 10

## 2012-06-11 MED ORDER — DEXAMETHASONE SODIUM PHOSPHATE 4 MG/ML IJ SOLN
20.0000 mg | Freq: Once | INTRAMUSCULAR | Status: AC
  Administered 2012-06-11: 20 mg via INTRAVENOUS

## 2012-06-11 MED ORDER — ONDANSETRON 16 MG/50ML IVPB (CHCC)
16.0000 mg | Freq: Once | INTRAVENOUS | Status: AC
  Administered 2012-06-11: 16 mg via INTRAVENOUS

## 2012-06-11 MED ORDER — HEPARIN SOD (PORK) LOCK FLUSH 100 UNIT/ML IV SOLN
500.0000 [IU] | Freq: Once | INTRAVENOUS | Status: AC | PRN
  Administered 2012-06-11: 500 [IU]
  Filled 2012-06-11: qty 5

## 2012-06-11 MED ORDER — SODIUM CHLORIDE 0.9 % IV SOLN
312.5000 mg | Freq: Once | INTRAVENOUS | Status: AC
  Administered 2012-06-11: 310 mg via INTRAVENOUS
  Filled 2012-06-11: qty 31

## 2012-06-11 NOTE — Progress Notes (Signed)
ID: Regina West   DOB: [birth year]  MR#: [id number]  BJY#:[id number]  PCP: Regina West, Regina West GYN: SU: Regina West OTHER Regina West: Regina West  HISTORY OF PRESENT ILLNESS: The patient originally presented in the summer of 2010 with cramps and abdominal distension.  I do not have a copy of the initial evaluation, but on November 26, 2008, the patient underwent optimal debulking with bilateral salpingo-oophorectomy, omentectomy, and the placement of an intraperitoneal port.  The pathology from that procedure (Accession Number [id number] at [facility]) showed first of all, significant involvement of the omentum, minimal involvement of the right ovary and right fallopian tube and negative on the left ovary and fallopian tube, with neither ovary being enlarged, consistent with a primary peritoneal serous adenocarcinoma, described as moderately differentiated.  The sample included no lymph nodes.    The patient had an intraperitoneal port placed in the same surgery and was treated with intraperitoneal and IV chemotherapy according to GOG-252 but I am not sure which arm she was in.  (Arm 2 did carboplatin intraperitoneally and Taxol IV.  Arm 3 did cisplatin and paclitaxel intraperitoneally with paclitaxel IV.)  All arms received bevacizumab.  Unfortunately, after 4 cycles of treatment, she had acute mental status changes and was admitted here January of 2011 with what proved to be posterior reversible leukoencephalopathy and SIADH.  She had seizures, aphasia, and required intubation.  Once the patient recovered from this, she was treated with 2 cycles of single-agent carboplatin (I do not have the AUC). Her last adjuvant treatment was May 12, 2009, and her CA-125 at that time was 10.3.  Her intraperitoneal port was removed in April of 2011.    More recently, the patient was in routine follow up when her CA-125 was found to jump up to 2,269.7 (March 26, 2010).  This is 10 months after her last prior chemotherapy.   She had CTs of the chest, abdomen and pelvis January 26th which showed ascites and enhancing peritoneal nodularity. She had had similar findings at presentation but these had completely resolved by the time she finished treatment in March of 2011. The patient was felt to be in first relapse. Subsequent treatments are as summarized below   INTERVAL HISTORY:  Regina West returns today with her daughter-in-law Regina West for followup of Regina West's ovarian cancer. Today is day 1 cycle 3 of her every 21 day carboplatin treatments  REVIEW OF SYSTEMS:  She does well days 1 and 2 of each treatment. Day 3 she starts "fading". She did find that additional fluids on day 5 helped, and now agrees to additional fluids on day 3 as well. In the meantime she is doing her best to keep herself well hydrated. She's had a couple of bruises in her left dorsal wrist and left ankle area she has not fallen. She doesn't recall any obvious "bumps". She's had no nausea or vomiting, no change in bowel or bladder habits, and her appetite and sense of taste is good. A detailed review of systems today was otherwise stable.  PAST MEDICAL HISTORY: Significant for hysterectomy at the age of 12 with concurrent bladder "tuck up."  She also underwent appendectomy during that procedure.  She is status post prior bilateral cataract surgery. She is status post tonsillectomy and adenoidectomy.  She has a history of hypertension and diabetes. She has a history of diverticulosis.  She has a large hiatal hernia.  She has degenerative disk disease.  She has been noted to have coronary calcifications and she has hyperlipidemia.  There is a history of remote tobacco abuse. Seizure disorder as per HPI  FAMILY HISTORY The patient's father died at the age of 38 after heart surgery for aortic stenosis.  The patient's mother died at the age of 73.  The patient has one brother in good health.  There is no other family history to her knowledge of ovarian or breast  cancer.    GYNECOLOGIC HISTORY: Menarche at age 21.  She is GX P3 with menopause in her late 24s.  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  Her husband was in sales and had a history of Parkinson's disease.  He died following a fall in the year 2000.  The patient's son Regina West lives in [place], and has a history of MS.  Daughter Regina West lives in [place]. She is a Futures trader.  Son Regina West lives in [place], [place] and is in Airline pilot. The patient has 8 grandchildren. She attends the [employer].  She is a good friend of our patients [name]   ADVANCED DIRECTIVES: in place  HEALTH MAINTENANCE: History  Substance Use Topics  . Smoking status: Former Smoker    Quit date: 05/24/1959  . Smokeless tobacco: Never Used  . Alcohol Use: No     Colonoscopy: 2008  PAP: UTD  MM: refuses  Allergies  Allergen Reactions  . Avastin (Bevacizumab) Other (See Comments)    Swelling of the brain     Current Outpatient Prescriptions  Medication Sig Dispense Refill  . acyclovir (ZOVIRAX) 400 MG tablet Take 1 tablet (400 mg total) by mouth 2 (two) times daily.  60 tablet  2  . antiseptic oral rinse (BIOTENE) LIQD 15 mLs by Mouth Rinse route 2 (two) times daily.  1 Bottle  0  . cholecalciferol (VITAMIN D) 1000 UNITS tablet Take 1,000 Units by mouth 2 (two) times daily.      Marland Kitchen dexamethasone (DECADRON) 4 MG tablet Take 2 tablets (8 mg total) by mouth 2 (two) times daily with a meal. Take two times a day starting the day after chemotherapy for 3 days.  30 tablet  1  . Epinastine HCl 0.05 % ophthalmic solution       . escitalopram (LEXAPRO) 10 MG tablet       . levETIRAcetam (KEPPRA) 500 MG tablet Take 250 mg by mouth every 12 (twelve) hours. Pt takes 1/2 tab for 250 mg dose      . lidocaine-prilocaine (EMLA) cream Apply 1 application topically as needed.      Marland Kitchen LOTEMAX 0.5 %  OINT       . metoCLOPramide (REGLAN) 5 MG tablet Take 1 tablet (5 mg total) by mouth 3 (three) times daily before meals.  90 tablet  2  . mirtazapine (REMERON SOL-TAB) 15 MG disintegrating tablet Take 1 tablet (15 mg total) by mouth at bedtime.  30 tablet  3  . neomycin-polymyxin b-dexamethasone (MAXITROL) 3.5-10000-0.1 OINT       . nitrofurantoin, macrocrystal-monohydrate, (MACROBID) 100 MG capsule Take 1 tablet bid x 3 days then 1 tablet daily indifently  30 capsule  6  . omeprazole (PRILOSEC) 40 MG capsule Take 1 capsule (40 mg total) by mouth at bedtime.  30 capsule  2  . ondansetron (ZOFRAN) 8 MG tablet Take 1 tablet (8 mg total) by mouth 2 (two) times daily. Take two times a day starting the  day after chemo for 3 days. Then take two times a day as needed for nausea or vomiting.  30 tablet  1  . ondansetron (ZOFRAN-ODT) 8 MG disintegrating tablet Take 1 tablet (8 mg total) by mouth every 8 (eight) hours as needed. nausea  20 tablet  3  . prochlorperazine (COMPAZINE) 10 MG tablet Take 1 tablet (10 mg total) by mouth every 6 (six) hours as needed.  30 tablet  0  . prochlorperazine (COMPAZINE) 10 MG tablet Take 1 tablet (10 mg total) by mouth every 6 (six) hours as needed.  30 tablet  3  . prochlorperazine (COMPAZINE) 25 MG suppository Place 1 suppository (25 mg total) rectally every 12 (twelve) hours as needed for nausea.  12 suppository  3   No current facility-administered medications for this visit.    OBJECTIVE: Elderly white woman in no acute distress  Filed Vitals:   06/11/12 0840  BP: 108/66  Pulse: 97  Temp: 97.9 F (36.6 C)  Resp: 20     Body mass index is 25.37 kg/(m^2).    ECOG FS: 2 Filed Weights   06/11/12 0840  Weight: 157 lb 1.6 oz (71.26 kg)    Sclerae unicteric Oropharynx clear No cervical or supraclavicular adenopathy Lungs no rales or rhonchi Heart regular rate and rhythm Abd soft, notdistended, positive bowel sounds, no masses palpated, no tenderness to  palpation MSK no focal spinal tenderness, no peripheral edema Neuro: nonfocal, pleasant affect, well oriented Breasts: Deferred  Skin: Bruising in the left wrist and left ankle as noted. This is flat, nontender, consistent in the wrist with senile purpura.   LAB RESULTS: Results for TYIESHA, BRACKNEY (MRN [id number]) as of 06/11/2012 08:59  Ref. Range 02/06/2012 11:37 04/09/2012 11:19 04/27/2012 10:50 05/21/2012 08:13 05/28/2012 10:34  CA 125 Latest Range: 0.0-30.2 U/mL 18.6 79.6 (H) 139.3 (H) 91.1 (H) 67.3 (H)    Lab Results  Component Value Date   CA125 67.3* 05/28/2012   Lab Results  Component Value Date   WBC 3.8* 06/11/2012   NEUTROABS 2.6 06/11/2012   HGB 11.2* 06/11/2012   HCT 32.7* 06/11/2012   MCV 101.6* 06/11/2012   PLT 96* 06/11/2012      Chemistry      Component Value Date/Time   NA 137 05/28/2012 1034   NA 136 10/17/2011 1118   NA 143 05/12/2011 1337   K 3.9 05/28/2012 1034   K 3.9 10/17/2011 1118   K 4.1 05/12/2011 1337   CL 102 05/28/2012 1034   CL 100 10/17/2011 1118   CL 92* 05/12/2011 1337   CO2 26 05/28/2012 1034   CO2 27 10/17/2011 1118   CO2 32 05/12/2011 1337   BUN 21.6 05/28/2012 1034   BUN 25* 10/17/2011 1118   BUN 19 05/12/2011 1337   CREATININE 1.3* 05/28/2012 1034   CREATININE 1.10 10/17/2011 1118   CREATININE 1.3* 05/12/2011 1337      Component Value Date/Time   CALCIUM 9.7 05/28/2012 1034   CALCIUM 9.8 10/17/2011 1118   CALCIUM 9.8 05/12/2011 1337   ALKPHOS 118 05/28/2012 1034   ALKPHOS 109 10/17/2011 1118   ALKPHOS 95* 05/12/2011 1337   AST 21 05/28/2012 1034   AST 22 10/17/2011 1118   AST 40* 05/12/2011 1337   ALT 28 05/28/2012 1034   ALT 14 10/17/2011 1118   BILITOT 0.45 05/28/2012 1034   BILITOT 0.4 10/17/2011 1118   BILITOT 0.60 05/12/2011 1337     Re   STUDIES: No results found.  ASSESSMENT:  79 y.o.  [place] woman   (1) status post optimal debulking of 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  After these problems resolved she received 2 additional cycles of single-agent carboplatin completed in March 2011  (2)  She had her first recurrence February 2012  treated with carboplatin and Gemzar for a total of 6 cycles between February and June 2012.  Her CEA-125 had normalized before the beginning of cycle 5.   (3) second recurrence documented February 2013,  treated with carboplatin/ doxil x 6, completed 10/03/2011, with a good response noted on restaging scans  (4) third recurrence documented February 2014, receiving single-agent carboplatin every 3 weeks, first dose 04/30/2012  PLAN:  Delisha is tolerating the carboplatin well, and there is a response by CEA 125. She will receive treatment today, and then receive fluids on days 3 and 5. She will see Dr. Nedra Hai at [facility] April 22, before she has her fourth cycle here. Jaquasha knows to call for any problems that may develop before the next visit.   Drew Herman C    06/11/2012

## 2012-06-11 NOTE — Patient Instructions (Addendum)
[facility] Discharge Instructions for Patients Receiving Chemotherapy  Today you received the following chemotherapy agents:  Carboplatin.  To help prevent nausea and vomiting after your treatment, we encourage you to take your nausea medication as ordered per MD.    If you develop nausea and vomiting that is not controlled by your nausea medication, call the clinic. If it is after clinic hours your family physician or the after hours number for the clinic or go to the Emergency Department.   BELOW ARE SYMPTOMS THAT SHOULD BE REPORTED IMMEDIATELY:  *FEVER GREATER THAN 100.5 F  *CHILLS WITH OR WITHOUT FEVER  NAUSEA AND VOMITING THAT IS NOT CONTROLLED WITH YOUR NAUSEA MEDICATION  *UNUSUAL SHORTNESS OF BREATH  *UNUSUAL BRUISING OR BLEEDING  TENDERNESS IN MOUTH AND THROAT WITH OR WITHOUT PRESENCE OF ULCERS  *URINARY PROBLEMS  *BOWEL PROBLEMS  UNUSUAL RASH Items with * indicate a potential emergency and should be followed up as soon as possible.   Please let the nurse know about any problems that you may have experienced. Feel free to call the clinic you have any questions or concerns. The clinic phone number is [phone].   I have been informed and understand all the instructions given to me. I know to contact the clinic, my physician, or go to the Emergency Department if any problems should occur. I do not have any questions at this time, but understand that I may call the clinic during office hours   should I have any questions or need assistance in obtaining follow up care.    __________________________________________  _____________  __________ Signature of Patient or Authorized Representative            Date                   Time    __________________________________________ Nurse's Signature

## 2012-06-11 NOTE — Telephone Encounter (Signed)
appts made and printed...waiting on a time slot for 4/28.

## 2012-06-11 NOTE — Progress Notes (Signed)
qqqq 

## 2012-06-11 NOTE — Progress Notes (Signed)
Patient had no reaction to the carboplatin test dose.

## 2012-06-13 ENCOUNTER — Ambulatory Visit (HOSPITAL_BASED_OUTPATIENT_CLINIC_OR_DEPARTMENT_OTHER): Payer: Medicare Other

## 2012-06-13 VITALS — BP 135/66 | HR 72 | Temp 98.1°F | Resp 19

## 2012-06-13 DIAGNOSIS — C569 Malignant neoplasm of unspecified ovary: Secondary | ICD-10-CM

## 2012-06-13 DIAGNOSIS — E860 Dehydration: Secondary | ICD-10-CM

## 2012-06-13 MED ORDER — SODIUM CHLORIDE 0.9 % IV SOLN
INTRAVENOUS | Status: DC
  Administered 2012-06-13: 13:00:00 via INTRAVENOUS

## 2012-06-13 NOTE — Patient Instructions (Addendum)
Dehydration, Adult Dehydration is when you lose more fluids from the body than you take in. Vital organs like the kidneys, brain, and heart cannot function without a proper amount of fluids and salt. Any loss of fluids from the body can cause dehydration.  CAUSES   Vomiting.  Diarrhea.  Excessive sweating.  Excessive urine output.  Fever. SYMPTOMS  Mild dehydration  Thirst.  Dry lips.  Slightly dry mouth. Moderate dehydration  Very dry mouth.  Sunken eyes.  Skin does not bounce back quickly when lightly pinched and released.  Dark urine and decreased urine production.  Decreased tear production.  Headache. Severe dehydration  Very dry mouth.  Extreme thirst.  Rapid, weak pulse (more than 100 beats per minute at rest).  Cold hands and feet.  Not able to sweat in spite of heat and temperature.  Rapid breathing.  Blue lips.  Confusion and lethargy.  Difficulty being awakened.  Minimal urine production.  No tears. DIAGNOSIS  Your caregiver will diagnose dehydration based on your symptoms and your exam. Blood and urine tests will help confirm the diagnosis. The diagnostic evaluation should also identify the cause of dehydration. TREATMENT  Treatment of mild or moderate dehydration can often be done at home by increasing the amount of fluids that you drink. It is best to drink small amounts of fluid more often. Drinking too much at one time can make vomiting worse. Refer to the home care instructions below. Severe dehydration needs to be treated at the hospital where you will probably be given intravenous (IV) fluids that contain water and electrolytes. HOME CARE INSTRUCTIONS   Ask your caregiver about specific rehydration instructions.  Drink enough fluids to keep your urine clear or pale yellow.  Drink small amounts frequently if you have nausea and vomiting.  Eat as you normally do.  Avoid:  Foods or drinks high in sugar.  Carbonated  drinks.  Juice.  Extremely hot or cold fluids.  Drinks with caffeine.  Fatty, greasy foods.  Alcohol.  Tobacco.  Overeating.  Gelatin desserts.  Wash your hands well to avoid spreading bacteria and viruses.  Only take over-the-counter or prescription medicines for pain, discomfort, or fever as directed by your caregiver.  Ask your caregiver if you should continue all prescribed and over-the-counter medicines.  Keep all follow-up appointments with your caregiver. SEEK MEDICAL CARE IF:  You have abdominal pain and it increases or stays in one area (localizes).  You have a rash, stiff neck, or severe headache.  You are irritable, sleepy, or difficult to awaken.  You are weak, dizzy, or extremely thirsty. SEEK IMMEDIATE MEDICAL CARE IF:   You are unable to keep fluids down or you get worse despite treatment.  You have frequent episodes of vomiting or diarrhea.  You have blood or green matter (bile) in your vomit.  You have blood in your stool or your stool looks black and tarry.  You have not urinated in 6 to 8 hours, or you have only urinated a small amount of very dark urine.  You have a fever.  You faint. MAKE SURE YOU:   Understand these instructions.  Will watch your condition.  Will get help right away if you are not doing well or get worse. Document Released: 02/21/2005 Document Revised: 05/16/2011 Document Reviewed: 10/11/2010 Exit[employer] Patient Information 2013 [employer].  

## 2012-06-15 ENCOUNTER — Ambulatory Visit (HOSPITAL_BASED_OUTPATIENT_CLINIC_OR_DEPARTMENT_OTHER): Payer: Medicare Other

## 2012-06-15 VITALS — BP 133/84 | HR 89 | Temp 97.0°F | Resp 20

## 2012-06-15 DIAGNOSIS — E860 Dehydration: Secondary | ICD-10-CM

## 2012-06-15 DIAGNOSIS — C569 Malignant neoplasm of unspecified ovary: Secondary | ICD-10-CM

## 2012-06-15 MED ORDER — SODIUM CHLORIDE 0.9 % IJ SOLN
10.0000 mL | INTRAMUSCULAR | Status: DC | PRN
  Administered 2012-06-15: 10 mL via INTRAVENOUS
  Filled 2012-06-15: qty 10

## 2012-06-15 MED ORDER — HEPARIN SOD (PORK) LOCK FLUSH 100 UNIT/ML IV SOLN
500.0000 [IU] | Freq: Once | INTRAVENOUS | Status: AC
  Administered 2012-06-15: 500 [IU] via INTRAVENOUS
  Filled 2012-06-15: qty 5

## 2012-06-15 MED ORDER — SODIUM CHLORIDE 0.9 % IV SOLN
Freq: Once | INTRAVENOUS | Status: AC
  Administered 2012-06-15: 10:00:00 via INTRAVENOUS

## 2012-06-15 NOTE — Patient Instructions (Addendum)
Dehydration, Adult Dehydration means your body does not have as much fluid as it needs. Your kidneys, brain, and heart will not work properly without the right amount of fluids and salt.  HOME CARE  Ask your doctor how to replace body fluid losses (rehydrate).  Drink enough fluids to keep your pee (urine) clear or pale yellow.  Drink small amounts of fluids often if you feel sick to your stomach (nauseous) or throw up (vomit).  Eat like you normally do.  Avoid:  Foods or drinks high in sugar.  Bubbly (carbonated) drinks.  Juice.  Very hot or cold fluids.  Drinks with caffeine.  Fatty, greasy foods.  Alcohol.  Tobacco.  Eating too much.  Gelatin desserts.  Wash your hands to avoid spreading germs (bacteria, viruses).  Only take medicine as told by your doctor.  Keep all doctor visits as told. GET HELP RIGHT AWAY IF:   You cannot drink something without throwing up.  You get worse even with treatment.  Your vomit has blood in it or looks greenish.  Your poop (stool) has blood in it or looks black and tarry.  You have not peed in 6 to 8 hours.  You pee a small amount of very dark pee.  You have a fever.  You pass out (faint).  You have belly (abdominal) pain that gets worse or stays in one spot (localizes).  You have a rash, stiff neck, or bad headache.  You get easily annoyed, sleepy, or are hard to wake up.  You feel weak, dizzy, or very thirsty. MAKE SURE YOU:   Understand these instructions.  Will watch your condition.  Will get help right away if you are not doing well or get worse. Document Released: 12/18/2008 Document Revised: 05/16/2011 Document Reviewed: 10/11/2010 [employer] Patient Information 2013 [employer].  

## 2012-06-26 DIAGNOSIS — Z5111 Encounter for antineoplastic chemotherapy: Secondary | ICD-10-CM | POA: Insufficient documentation

## 2012-07-02 ENCOUNTER — Other Ambulatory Visit (HOSPITAL_BASED_OUTPATIENT_CLINIC_OR_DEPARTMENT_OTHER): Payer: Medicare Other | Admitting: Lab

## 2012-07-02 ENCOUNTER — Other Ambulatory Visit: Payer: Self-pay | Admitting: *Deleted

## 2012-07-02 ENCOUNTER — Telehealth: Payer: Self-pay | Admitting: *Deleted

## 2012-07-02 ENCOUNTER — Encounter: Payer: Medicare Other | Admitting: Family

## 2012-07-02 ENCOUNTER — Ambulatory Visit: Payer: Medicare Other | Admitting: Lab

## 2012-07-02 ENCOUNTER — Other Ambulatory Visit: Payer: Medicare Other | Admitting: Lab

## 2012-07-02 ENCOUNTER — Ambulatory Visit: Payer: Medicare Other

## 2012-07-02 DIAGNOSIS — C569 Malignant neoplasm of unspecified ovary: Secondary | ICD-10-CM

## 2012-07-02 LAB — CBC WITH DIFFERENTIAL/PLATELET
BASO%: 0 % (ref 0.0–2.0)
EOS%: 0.7 % (ref 0.0–7.0)
MCH: 36.1 pg — ABNORMAL HIGH (ref 25.1–34.0)
MCV: 105.6 fL — ABNORMAL HIGH (ref 79.5–101.0)
MONO%: 13.5 % (ref 0.0–14.0)
RBC: 2.88 10*6/uL — ABNORMAL LOW (ref 3.70–5.45)
RDW: 16.5 % — ABNORMAL HIGH (ref 11.2–14.5)

## 2012-07-02 NOTE — Telephone Encounter (Signed)
appts made and printed for 07/09/12. Pt is aware that her tx will be added for 07/09/12. i emailed MB and aseked her to add a tx for this day as well....td

## 2012-07-02 NOTE — Telephone Encounter (Signed)
I tried to call the pt and inform her that her tx was added for 07/09/12@ 11am. However, no one answered and no vm.

## 2012-07-03 ENCOUNTER — Other Ambulatory Visit: Payer: Medicare Other | Admitting: Lab

## 2012-07-04 ENCOUNTER — Ambulatory Visit: Payer: Medicare Other

## 2012-07-04 ENCOUNTER — Other Ambulatory Visit: Payer: Self-pay | Admitting: Oncology

## 2012-07-04 NOTE — Progress Notes (Signed)
Patient was unaware of appointment for today. Patient states she will see [place] on Friday for fluids. Informed patient if she has any concerning symptoms between now and Friday to give our office a call. Patient verbalized understanding.

## 2012-07-05 NOTE — Progress Notes (Signed)
This encounter was created in error - please disregard.

## 2012-07-06 ENCOUNTER — Ambulatory Visit: Payer: Medicare Other

## 2012-07-06 ENCOUNTER — Encounter: Payer: Self-pay | Admitting: Pharmacist

## 2012-07-06 ENCOUNTER — Other Ambulatory Visit: Payer: Self-pay | Admitting: *Deleted

## 2012-07-06 DIAGNOSIS — C569 Malignant neoplasm of unspecified ovary: Secondary | ICD-10-CM

## 2012-07-09 ENCOUNTER — Ambulatory Visit (HOSPITAL_BASED_OUTPATIENT_CLINIC_OR_DEPARTMENT_OTHER): Payer: Medicare Other | Admitting: Oncology

## 2012-07-09 ENCOUNTER — Telehealth: Payer: Self-pay | Admitting: *Deleted

## 2012-07-09 ENCOUNTER — Ambulatory Visit (HOSPITAL_BASED_OUTPATIENT_CLINIC_OR_DEPARTMENT_OTHER): Payer: Medicare Other

## 2012-07-09 ENCOUNTER — Other Ambulatory Visit (HOSPITAL_BASED_OUTPATIENT_CLINIC_OR_DEPARTMENT_OTHER): Payer: Medicare Other | Admitting: Lab

## 2012-07-09 VITALS — BP 120/71 | HR 81 | Temp 98.2°F | Resp 20 | Ht 66.0 in | Wt 158.8 lb

## 2012-07-09 DIAGNOSIS — C569 Malignant neoplasm of unspecified ovary: Secondary | ICD-10-CM

## 2012-07-09 DIAGNOSIS — Z5111 Encounter for antineoplastic chemotherapy: Secondary | ICD-10-CM

## 2012-07-09 LAB — CBC WITH DIFFERENTIAL/PLATELET
Eosinophils Absolute: 0 10*3/uL (ref 0.0–0.5)
MONO#: 0.5 10*3/uL (ref 0.1–0.9)
MONO%: 14.3 % — ABNORMAL HIGH (ref 0.0–14.0)
NEUT#: 2.1 10*3/uL (ref 1.5–6.5)
RBC: 2.99 10*6/uL — ABNORMAL LOW (ref 3.70–5.45)
RDW: 16.2 % — ABNORMAL HIGH (ref 11.2–14.5)
WBC: 3.5 10*3/uL — ABNORMAL LOW (ref 3.9–10.3)

## 2012-07-09 MED ORDER — SODIUM CHLORIDE 0.9 % IJ SOLN
10.0000 mL | INTRAMUSCULAR | Status: DC | PRN
  Administered 2012-07-09: 10 mL
  Filled 2012-07-09: qty 10

## 2012-07-09 MED ORDER — SODIUM CHLORIDE 0.9 % IV SOLN
Freq: Once | INTRAVENOUS | Status: AC
  Administered 2012-07-09: 12:00:00 via INTRAVENOUS

## 2012-07-09 MED ORDER — HEPARIN SOD (PORK) LOCK FLUSH 100 UNIT/ML IV SOLN
500.0000 [IU] | Freq: Once | INTRAVENOUS | Status: AC | PRN
  Administered 2012-07-09: 500 [IU]
  Filled 2012-07-09: qty 5

## 2012-07-09 MED ORDER — DEXAMETHASONE SODIUM PHOSPHATE 20 MG/5ML IJ SOLN
20.0000 mg | Freq: Once | INTRAMUSCULAR | Status: AC
  Administered 2012-07-09: 20 mg via INTRAVENOUS

## 2012-07-09 MED ORDER — SODIUM CHLORIDE 0.9 % IV SOLN
312.5000 mg | Freq: Once | INTRAVENOUS | Status: AC
  Administered 2012-07-09: 310 mg via INTRAVENOUS
  Filled 2012-07-09: qty 31

## 2012-07-09 MED ORDER — CARBOPLATIN CHEMO INTRADERMAL TEST DOSE 100MCG/0.02ML
100.0000 ug | Freq: Once | INTRADERMAL | Status: AC
  Administered 2012-07-09: 100 ug via INTRADERMAL
  Filled 2012-07-09: qty 0.01

## 2012-07-09 MED ORDER — ONDANSETRON 16 MG/50ML IVPB (CHCC)
16.0000 mg | Freq: Once | INTRAVENOUS | Status: AC
  Administered 2012-07-09: 16 mg via INTRAVENOUS

## 2012-07-09 NOTE — Progress Notes (Signed)
Carboplatin skin test administered to the right forearm. Skin test was checked at 5 min, 15 min and 30 minutes, no reaction noted. Proceed with infusion per protocol.

## 2012-07-09 NOTE — Patient Instructions (Addendum)
[facility] Discharge Instructions for Patients Receiving Chemotherapy  Today you received the following chemotherapy agents Carboplatin To help prevent nausea and vomiting after your treatment, we encourage you to take your nausea medication as prescribed.  If you develop nausea and vomiting that is not controlled by your nausea medication, call the clinic. If it is after clinic hours your family physician or the after hours number for the clinic or go to the Emergency Department.   BELOW ARE SYMPTOMS THAT SHOULD BE REPORTED IMMEDIATELY:  *FEVER GREATER THAN 100.5 F  *CHILLS WITH OR WITHOUT FEVER  NAUSEA AND VOMITING THAT IS NOT CONTROLLED WITH YOUR NAUSEA MEDICATION  *UNUSUAL SHORTNESS OF BREATH  *UNUSUAL BRUISING OR BLEEDING  TENDERNESS IN MOUTH AND THROAT WITH OR WITHOUT PRESENCE OF ULCERS  *URINARY PROBLEMS  *BOWEL PROBLEMS  UNUSUAL RASH Items with * indicate a potential emergency and should be followed up as soon as possible.  One of the nurses will contact you 24 hours after your treatment. Please let the nurse know about any problems that you may have experienced. Feel free to call the clinic you have any questions or concerns. The clinic phone number is [phone].   I have been informed and understand all the instructions given to me. I know to contact the clinic, my physician, or go to the Emergency Department if any problems should occur. I do not have any questions at this time, but understand that I may call the clinic during office hours   should I have any questions or need assistance in obtaining follow up care.    __________________________________________  _____________  __________ Signature of Patient or Authorized Representative            Date                   Time    __________________________________________ Nurse's Signature

## 2012-07-09 NOTE — Telephone Encounter (Signed)
appts made and printed for 5/26, 6/16, and 7/14. Made pt aware that ivf and tx will be added on later. Also made her aware that cs will call her for her appt...td

## 2012-07-09 NOTE — Progress Notes (Signed)
ID: Regina West   DOB: [birth year]  MR#: [id number]  WUJ#:[id number]  PCP: Regina Meo, MD GYN: SU: Regina West OTHER MD: Regina West  HISTORY OF PRESENT ILLNESS: The patient originally presented in the summer of 2010 with cramps and abdominal distension.  I do not have a copy of the initial evaluation, but on November 26, 2008, the patient underwent optimal debulking with bilateral salpingo-oophorectomy, omentectomy, and the placement of an intraperitoneal port.  The pathology from that procedure (Accession Number [id number] at [facility]) showed first of all, significant involvement of the omentum, minimal involvement of the right ovary and right fallopian tube and negative on the left ovary and fallopian tube, with neither ovary being enlarged, consistent with a primary peritoneal serous adenocarcinoma, described as moderately differentiated.  The sample included no lymph nodes.    The patient had an intraperitoneal port placed in the same surgery and was treated with intraperitoneal and IV chemotherapy according to GOG-252 but I am not sure which arm she was in.  (Arm 2 did carboplatin intraperitoneally and Taxol IV.  Arm 3 did cisplatin and paclitaxel intraperitoneally with paclitaxel IV.)  All arms received bevacizumab.  Unfortunately, after 4 cycles of treatment, she had acute mental status changes and was admitted here January of 2011 with what proved to be posterior reversible leukoencephalopathy and SIADH.  She had seizures, aphasia, and required intubation.  Once the patient recovered from this, she was treated with 2 cycles of single-agent carboplatin (I do not have the AUC). Her last adjuvant treatment was May 12, 2009, and her CA-125 at that time was 10.3.  Her intraperitoneal port was removed in April of 2011.    More recently, the patient was in routine follow up when her CA-125 was found to jump up to 2,269.7 (March 26, 2010).  This is 10 months after her last prior chemotherapy.   She had CTs of the chest, abdomen and pelvis January 26th which showed ascites and enhancing peritoneal nodularity. She had had similar findings at presentation but these had completely resolved by the time she finished treatment in March of 2011. The patient was felt to be in first relapse. Subsequent treatments are as summarized below   INTERVAL HISTORY:  Regina West returns today with her daughter Regina West for followup of Regina West's ovarian cancer. Since her last visit here March was re\re evaluated at [facility] under Dr. Nedra West. C. suggested we do 6 cycles of carboplatin, and then repeat a CT of the abdomen. Today is day 1 cycle 4 of her every 21 day carboplatin treatments  REVIEW OF SYSTEMS:  Regina West is tolerating treatment remarkably well. She is having some problems with scheduling and we discussed that at length today. She is drinking a lot of fluids, and this is causing some nocturia. Sometimes she feels dizzy when she stands up from a sitting position. She can be slightly depressed. She is very much looking forward to no longer receiving these treatments, but she understands she still has 3 to go. A detailed review of systems was otherwise noncontributory, and remarkably she has not lost her hair.  PAST MEDICAL HISTORY: Significant for hysterectomy at the age of 89 with concurrent bladder "tuck up."  She also underwent appendectomy during that procedure.  She is status post prior bilateral cataract surgery. She is status post tonsillectomy and adenoidectomy.  She has a history of hypertension and diabetes. She has a history of diverticulosis.  She has a large hiatal hernia.  She has degenerative disk disease.  She has been noted to have coronary calcifications and she has hyperlipidemia.  There is a history of remote tobacco abuse. Seizure disorder as per HPI  FAMILY HISTORY The patient's father died at the age of 40 after heart surgery for aortic stenosis.  The patient's mother died at the age of 75.  The  patient has one brother in good health.  There is no other family history to her knowledge of ovarian or breast cancer.    GYNECOLOGIC HISTORY: Menarche at age 38.  She is GX P3 with menopause in her late 5s.  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 She is currently residing in [place]. Her husband was in sales and had a history of Parkinson's disease.  He died following a fall in the year 2000.  The patient's son Regina West lives in [place], and has a history of MS.  Daughter Regina West lives in [place]. She is a Futures trader.  Son Molli West lives in [place], [place] and is in Airline pilot. The patient has 8 grandchildren. She attends the [employer].  She is a good friend of our patients [name]   ADVANCED DIRECTIVES: in place  HEALTH MAINTENANCE: History  Substance Use Topics  . Smoking status: Former Smoker    Quit date: 05/24/1959  . Smokeless tobacco: Never Used  . Alcohol Use: No     Colonoscopy: 2008  PAP: UTD  MM: refuses  Allergies  Allergen Reactions  . Avastin (Bevacizumab) Other (See Comments)    Swelling of the brain     Current Outpatient Prescriptions  Medication Sig Dispense Refill  . acyclovir (ZOVIRAX) 400 MG tablet Take 1 tablet (400 mg total) by mouth 2 (two) times daily.  60 tablet  2  . antiseptic oral rinse (BIOTENE) LIQD 15 mLs by Mouth Rinse route 2 (two) times daily.  1 Bottle  0  . cholecalciferol (VITAMIN D) 1000 UNITS tablet Take 1,000 Units by mouth 2 (two) times daily.      Marland Kitchen dexamethasone (DECADRON) 4 MG tablet Take 2 tablets (8 mg total) by mouth 2 (two) times daily with a meal. Take two times a day starting the day after chemotherapy for 3 days.  30 tablet  1  . Epinastine HCl 0.05 % ophthalmic solution       . escitalopram (LEXAPRO) 10 MG tablet       . levETIRAcetam (KEPPRA) 500 MG tablet Take 250 mg by mouth every 12  (twelve) hours. Pt takes 1/2 tab for 250 mg dose      . lidocaine-prilocaine (EMLA) cream Apply 1 application topically as needed.      Marland Kitchen LOTEMAX 0.5 % OINT       . metoCLOPramide (REGLAN) 5 MG tablet Take 1 tablet (5 mg total) by mouth 3 (three) times daily before meals.  90 tablet  2  . mirtazapine (REMERON SOL-TAB) 15 MG disintegrating tablet Take 1 tablet (15 mg total) by mouth at bedtime.  30 tablet  3  . neomycin-polymyxin b-dexamethasone (MAXITROL) 3.5-10000-0.1 OINT       . nitrofurantoin, macrocrystal-monohydrate, (MACROBID) 100 MG capsule Take 1 tablet bid x 3 days then 1 tablet daily indifently  30 capsule  6  . omeprazole (PRILOSEC) 40 MG capsule Take 1 capsule (40 mg total) by mouth at bedtime.  30 capsule  2  . ondansetron (ZOFRAN) 8 MG tablet Take  1 tablet (8 mg total) by mouth 2 (two) times daily. Take two times a day starting the day after chemo for 3 days. Then take two times a day as needed for nausea or vomiting.  30 tablet  1  . ondansetron (ZOFRAN-ODT) 8 MG disintegrating tablet Take 1 tablet (8 mg total) by mouth every 8 (eight) hours as needed. nausea  20 tablet  3  . prochlorperazine (COMPAZINE) 10 MG tablet Take 1 tablet (10 mg total) by mouth every 6 (six) hours as needed.  30 tablet  0  . prochlorperazine (COMPAZINE) 10 MG tablet Take 1 tablet (10 mg total) by mouth every 6 (six) hours as needed.  30 tablet  3  . prochlorperazine (COMPAZINE) 25 MG suppository Place 1 suppository (25 mg total) rectally every 12 (twelve) hours as needed for nausea.  12 suppository  3   No current facility-administered medications for this visit.    OBJECTIVE: Elderly white woman who looks well  Filed Vitals:   07/09/12 1037  BP: 120/71  Pulse: 81  Temp: 98.2 F (36.8 C)  Resp: 20     Body mass index is 25.64 kg/(m^2).    ECOG FS: 2 Filed Weights   07/09/12 1037  Weight: 158 lb 12.8 oz (72.031 kg)    Sclerae unicteric Oropharynx clear No cervical or supraclavicular  adenopathy Lungs no rales or rhonchi Heart regular rate and rhythm Abd soft, notdistended, nontender positive bowel sounds, no masses palpated, MSK no focal spinal tenderness, no peripheral edema Neuro: nonfocal, pleasant affect, well oriented Breasts: Deferred   LAB RESULTS: Results for KAJAH, SANTIZO (MRN [id number]) as of 07/09/2012 11:27  Ref. Range 04/09/2012 11:19 04/27/2012 10:50 05/21/2012 08:13 05/28/2012 10:34 06/11/2012 08:27  CA 125 Latest Range: 0.0-30.2 U/mL 79.6 (H) 139.3 (H) 91.1 (H) 67.3 (H) 36.1 (H)    Lab Results  Component Value Date   CA125 36.1* 06/11/2012   Lab Results  Component Value Date   WBC 3.5* 07/09/2012   NEUTROABS 2.1 07/09/2012   HGB 10.8* 07/09/2012   HCT 31.6* 07/09/2012   MCV 105.7* 07/09/2012   PLT 112* 07/09/2012      Chemistry      Component Value Date/Time   NA 136 06/11/2012 0827   NA 136 10/17/2011 1118   NA 143 05/12/2011 1337   K 4.2 06/11/2012 0827   K 3.9 10/17/2011 1118   K 4.1 05/12/2011 1337   CL 99 06/11/2012 0827   CL 100 10/17/2011 1118   CL 92* 05/12/2011 1337   CO2 27 06/11/2012 0827   CO2 27 10/17/2011 1118   CO2 32 05/12/2011 1337   BUN 28.5* 06/11/2012 0827   BUN 25* 10/17/2011 1118   BUN 19 05/12/2011 1337   CREATININE 1.3* 06/11/2012 0827   CREATININE 1.10 10/17/2011 1118   CREATININE 1.3* 05/12/2011 1337      Component Value Date/Time   CALCIUM 9.9 06/11/2012 0827   CALCIUM 9.8 10/17/2011 1118   CALCIUM 9.8 05/12/2011 1337   ALKPHOS 114 06/11/2012 0827   ALKPHOS 109 10/17/2011 1118   ALKPHOS 95* 05/12/2011 1337   AST 20 06/11/2012 0827   AST 22 10/17/2011 1118   AST 40* 05/12/2011 1337   ALT 13 06/11/2012 0827   ALT 14 10/17/2011 1118   BILITOT 0.48 06/11/2012 0827   BILITOT 0.4 10/17/2011 1118   BILITOT 0.60 05/12/2011 1337      STUDIES: No results found.  ASSESSMENT: 79 y.o.  [place] woman   (1) status post  optimal debulking of a primary peritoneal serous adenocarcinoma September 2010, the tumor being moderately differentiated, pT3c NX (stage IIIC),  treated according to GOG 252 with  intraperitoneal platinum and paclitaxel x4 given along with bevacizumab, complicated by SIADHafter cycle 4, also with posterior reversible leukoencephalopathy.  After these problems resolved she received 2 additional cycles of single-agent carboplatin completed in March 2011  (2)  She had her first recurrence February 2012  treated with carboplatin and Gemzar for a total of 6 cycles between February and June 2012.  Her CEA-125 had normalized before the beginning of cycle 5.   (3) second recurrence documented February 2013,  treated with carboplatin/ doxil x 6, completed 10/03/2011, with a good response noted on restaging scans  (4) third recurrence documented February 2014, receiving single-agent carboplatin every 3 weeks, first dose 04/30/2012  PLAN:  Leonna is doing remarkably well as far as her ovarian cancer is concerned and the plan is to continue treatment to a total of 6 doses, and then consider stopping. We will obtain a CT scan July 11 and she will see me July 14 to review those results. She will receive her fourth cycle today and then fifth in 3 weeks and 6 in 6 weeks. We are going to do fluids on the Tuesday, Wednesday, and Friday after each treatment. She knows to call for any problems that may develop before the next visit. Lorenzo Arscott C    07/09/2012

## 2012-07-10 ENCOUNTER — Ambulatory Visit: Payer: Medicare Other

## 2012-07-11 ENCOUNTER — Telehealth: Payer: Self-pay | Admitting: *Deleted

## 2012-07-11 ENCOUNTER — Ambulatory Visit (HOSPITAL_BASED_OUTPATIENT_CLINIC_OR_DEPARTMENT_OTHER): Payer: Medicare Other

## 2012-07-11 VITALS — BP 147/64 | HR 84 | Temp 98.8°F

## 2012-07-11 DIAGNOSIS — C569 Malignant neoplasm of unspecified ovary: Secondary | ICD-10-CM

## 2012-07-11 DIAGNOSIS — E860 Dehydration: Secondary | ICD-10-CM

## 2012-07-11 MED ORDER — SODIUM CHLORIDE 0.9 % IV SOLN
INTRAVENOUS | Status: DC
  Administered 2012-07-11: 10:00:00 via INTRAVENOUS

## 2012-07-11 NOTE — Telephone Encounter (Signed)
Lm gave appt for 07/13/12 tx at 2:30pm...td

## 2012-07-11 NOTE — Patient Instructions (Addendum)
Dehydration, Elderly  Dehydration is when you lose more fluids from the body than you take in. Vital organs such as the kidneys, brain, and heart cannot function without a proper amount of fluids and salt. Any loss of fluids from the body can cause dehydration.   Older adults are at a higher risk of dehydration than younger adults. As we age, our bodies are less able to conserve water and do not respond to temperature changes as well. Also, older adults do not become thirsty as easily or quickly. Because of this, older adults often do not realize they need to increase fluids to avoid dehydration.   CAUSES    Vomiting.   Diarrhea.   Excessive sweating.   Excessive urine output.   Fever.  SYMPTOMS   Mild dehydration   Thirst.   Dry lips.   Slightly dry mouth.  Moderate dehydration   Very dry mouth.   Sunken eyes.   Skin does not bounce back quickly when lightly pinched and released.   Dark urine and decreased urine production.   Decreased tear production.   Headache.  Severe dehydration   Very dry mouth.   Extreme thirst.   Rapid, weak pulse (more than 100 beats per minute at rest).   Cold hands and feet.   Not able to sweat in spite of heat.   Rapid breathing.   Blue lips.   Confusion and lethargy.   Difficulty being awakened.   Minimal urine production.   No tears.  DIAGNOSIS   Your caregiver will diagnose dehydration based on your symptoms and your exam. Blood and urine tests will help confirm the diagnosis. The diagnostic evaluation should also identify the cause of dehydration.  TREATMENT   Treatment of mild or moderate dehydration can often be done at home by increasing the amount of fluids that you drink. It is best to drink small amounts of fluid more often. Drinking too much at one time can make vomiting worse.   Severe dehydration needs to be treated at the hospital where you will probably be given intravenous (IV) fluids that contain water and electrolytes.  HOME CARE INSTRUCTIONS     Ask your caregiver about specific rehydration instructions.   Drink enough fluids to keep your urine clear or pale yellow.   Drink small amounts frequently if you have nausea and vomiting.   Eat as you normally do.   Avoid:   Foods or drinks high in sugar.   Carbonated drinks.   Juice.   Extremely hot or cold fluids.   Drinks with caffeine.   Fatty, greasy foods.   Alcohol.   Tobacco.   Overeating.   Gelatin desserts.   Wash your hands well to avoid spreading bacteria and viruses.   Only take over-the-counter or prescription medicines for pain, discomfort, or fever as directed by your caregiver.   Ask your caregiver if you should continue all prescribed and over-the-counter medicines.   Keep all follow-up appointments with your caregiver.  SEEK MEDICAL CARE IF:   You have abdominal pain and it increases or stays in one area (localizes).   You have a rash, stiff neck, or severe headache.   You are irritable, sleepy, or difficult to awaken.   You are weak, dizzy, or extremely thirsty.  SEEK IMMEDIATE MEDICAL CARE IF:    You are unable to keep fluids down, or you get worse despite treatment.   You have frequent episodes of vomiting or diarrhea.   You have blood or green   right away if you are not doing well or get worse. Document Released: 05/14/2003 Document Revised: 05/16/2011 Document Reviewed: 10/11/2010 [facility] Patient Information 2013 [place], [place].

## 2012-07-12 ENCOUNTER — Ambulatory Visit: Payer: Medicare Other

## 2012-07-13 ENCOUNTER — Ambulatory Visit: Payer: Medicare Other

## 2012-07-31 ENCOUNTER — Telehealth: Payer: Self-pay | Admitting: *Deleted

## 2012-07-31 ENCOUNTER — Telehealth: Payer: Self-pay | Admitting: Oncology

## 2012-07-31 ENCOUNTER — Encounter: Payer: Self-pay | Admitting: Physician Assistant

## 2012-07-31 ENCOUNTER — Ambulatory Visit (HOSPITAL_BASED_OUTPATIENT_CLINIC_OR_DEPARTMENT_OTHER): Payer: Medicare Other

## 2012-07-31 ENCOUNTER — Other Ambulatory Visit (HOSPITAL_BASED_OUTPATIENT_CLINIC_OR_DEPARTMENT_OTHER): Payer: Medicare Other

## 2012-07-31 ENCOUNTER — Ambulatory Visit (HOSPITAL_BASED_OUTPATIENT_CLINIC_OR_DEPARTMENT_OTHER): Payer: Medicare Other | Admitting: Physician Assistant

## 2012-07-31 VITALS — BP 116/72 | HR 80 | Temp 97.8°F | Resp 20 | Ht 66.0 in | Wt 160.6 lb

## 2012-07-31 DIAGNOSIS — D649 Anemia, unspecified: Secondary | ICD-10-CM

## 2012-07-31 DIAGNOSIS — C569 Malignant neoplasm of unspecified ovary: Secondary | ICD-10-CM

## 2012-07-31 DIAGNOSIS — E860 Dehydration: Secondary | ICD-10-CM

## 2012-07-31 DIAGNOSIS — Z5111 Encounter for antineoplastic chemotherapy: Secondary | ICD-10-CM

## 2012-07-31 LAB — CBC WITH DIFFERENTIAL/PLATELET
Basophils Absolute: 0 10*3/uL (ref 0.0–0.1)
EOS%: 0.5 % (ref 0.0–7.0)
Eosinophils Absolute: 0 10*3/uL (ref 0.0–0.5)
HCT: 30.2 % — ABNORMAL LOW (ref 34.8–46.6)
HGB: 10.5 g/dL — ABNORMAL LOW (ref 11.6–15.9)
MCH: 38.3 pg — ABNORMAL HIGH (ref 25.1–34.0)
MCV: 110.1 fL — ABNORMAL HIGH (ref 79.5–101.0)
MONO%: 15.6 % — ABNORMAL HIGH (ref 0.0–14.0)
NEUT#: 2.5 10*3/uL (ref 1.5–6.5)
NEUT%: 62.3 % (ref 38.4–76.8)
Platelets: 143 10*3/uL — ABNORMAL LOW (ref 145–400)

## 2012-07-31 LAB — COMPREHENSIVE METABOLIC PANEL (CC13)
Alkaline Phosphatase: 99 U/L (ref 40–150)
BUN: 36.7 mg/dL — ABNORMAL HIGH (ref 7.0–26.0)
Creatinine: 1.5 mg/dL — ABNORMAL HIGH (ref 0.6–1.1)
Glucose: 193 mg/dl — ABNORMAL HIGH (ref 70–99)
Total Bilirubin: 0.65 mg/dL (ref 0.20–1.20)

## 2012-07-31 MED ORDER — CARBOPLATIN CHEMO INTRADERMAL TEST DOSE 100MCG/0.02ML
100.0000 ug | Freq: Once | INTRADERMAL | Status: AC
  Administered 2012-07-31: 100 ug via INTRADERMAL
  Filled 2012-07-31: qty 0.01

## 2012-07-31 MED ORDER — DEXAMETHASONE SODIUM PHOSPHATE 20 MG/5ML IJ SOLN
20.0000 mg | Freq: Once | INTRAMUSCULAR | Status: AC
  Administered 2012-07-31: 20 mg via INTRAVENOUS

## 2012-07-31 MED ORDER — SODIUM CHLORIDE 0.9 % IV SOLN
Freq: Once | INTRAVENOUS | Status: AC
  Administered 2012-07-31: 15:00:00 via INTRAVENOUS

## 2012-07-31 MED ORDER — SODIUM CHLORIDE 0.9 % IV SOLN
312.5000 mg | Freq: Once | INTRAVENOUS | Status: AC
  Administered 2012-07-31: 310 mg via INTRAVENOUS
  Filled 2012-07-31: qty 31

## 2012-07-31 MED ORDER — SODIUM CHLORIDE 0.9 % IJ SOLN
10.0000 mL | INTRAMUSCULAR | Status: DC | PRN
  Administered 2012-07-31: 10 mL
  Filled 2012-07-31: qty 10

## 2012-07-31 MED ORDER — HEPARIN SOD (PORK) LOCK FLUSH 100 UNIT/ML IV SOLN
500.0000 [IU] | Freq: Once | INTRAVENOUS | Status: AC | PRN
  Administered 2012-07-31: 500 [IU]
  Filled 2012-07-31: qty 5

## 2012-07-31 MED ORDER — ONDANSETRON 16 MG/50ML IVPB (CHCC)
16.0000 mg | Freq: Once | INTRAVENOUS | Status: AC
  Administered 2012-07-31: 16 mg via INTRAVENOUS

## 2012-07-31 NOTE — Progress Notes (Signed)
ID: Regina West   DOB: 01/05/1930  MR#: [id number]  WUJ#:[id number]  PCP: Minda Meo, MD GYN: SU: Ivar Drape OTHER MD: Porfirio Mylar Dohmeier  HISTORY OF PRESENT ILLNESS: The patient originally presented in the summer of 2010 with cramps and abdominal distension.  I do not have a copy of the initial evaluation, but on November 26, 2008, the patient underwent optimal debulking with bilateral salpingo-oophorectomy, omentectomy, and the placement of an intraperitoneal port.  The pathology from that procedure (Accession Number [id number] at [facility]) showed first of all, significant involvement of the omentum, minimal involvement of the right ovary and right fallopian tube and negative on the left ovary and fallopian tube, with neither ovary being enlarged, consistent with a primary peritoneal serous adenocarcinoma, described as moderately differentiated.  The sample included no lymph nodes.    The patient had an intraperitoneal port placed in the same surgery and was treated with intraperitoneal and IV chemotherapy according to GOG-252 but I am not sure which arm she was in.  (Arm 2 did carboplatin intraperitoneally and Taxol IV.  Arm 3 did cisplatin and paclitaxel intraperitoneally with paclitaxel IV.)  All arms received bevacizumab.  Unfortunately, after 4 cycles of treatment, she had acute mental status changes and was admitted here January of 2011 with what proved to be posterior reversible leukoencephalopathy and SIADH.  She had seizures, aphasia, and required intubation.  Once the patient recovered from this, she was treated with 2 cycles of single-agent carboplatin (I do not have the AUC). Her last adjuvant treatment was May 12, 2009, and her CA-125 at that time was 10.3.  Her intraperitoneal port was removed in April of 2011.    More recently, the patient was in routine follow up when her CA-125 was found to jump up to 2,269.7 (March 26, 2010).  This is 10 months after her last prior chemotherapy.   She had CTs of the chest, abdomen and pelvis January 26th which showed ascites and enhancing peritoneal nodularity. She had had similar findings at presentation but these had completely resolved by the time she finished treatment in March of 2011. The patient was felt to be in first relapse. Subsequent treatments are as summarized below   INTERVAL HISTORY:  Regina West returns today with her daughters for followup of Regina West's ovarian cancer. She is due for day 1 cycle 5 of 6 planned q. three-week doses of carboplatin today. She receives supportive IV fluids for several days following treatment due to history of dehydration, accompanied by elevated serum creatinine.  When asked what her biggest complaint is today, Regina West tells me she "doesn't have any". Her energy level comes and goes, but otherwise she is feeling well.   REVIEW OF SYSTEMS:  [name] has had no recent fevers or chills. Her appetite is fair and she is eating and keeping herself well hydrated. She's had no nausea. She's having regular bowel movements and denies any change in urinary habits. She's had no cough, phlegm production, chest pain, palpitations. She does have some shortness of breath with exertion alone. She denies any abnormal headaches. She's had no unusual myalgias, arthralgias, bony pain, or peripheral swelling. She also denies any abdominal or pelvic pain.  A detailed review of systems is otherwise stable and noncontributory.   PAST MEDICAL HISTORY: Significant for hysterectomy at the age of 73 with concurrent bladder "tuck up."  She also underwent appendectomy during that procedure.  She is status post prior bilateral cataract surgery. She is status post tonsillectomy and adenoidectomy.  She  has a history of hypertension and diabetes. She has a history of diverticulosis.  She has a large hiatal hernia.  She has degenerative disk disease.  She has been noted to have coronary calcifications and she has hyperlipidemia.  There is a  history of remote tobacco abuse. Seizure disorder as per HPI  FAMILY HISTORY The patient's father died at the age of 85 after heart surgery for aortic stenosis.  The patient's mother died at the age of 57.  The patient has one brother in good health.  There is no other family history to her knowledge of ovarian or breast cancer.    GYNECOLOGIC HISTORY: Menarche at age 85.  She is GX P3 with menopause in her late 2s.  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 She is currently residing in [place]. Her husband was in sales and had a history of Parkinson's disease.  He died following a fall in the year 2000.  The patient's son Regina West lives in [place], and has a history of MS.  Daughter Regina West lives in [place]. She is a Futures trader.  Son Regina West lives in [place], [place] and is in Airline pilot. The patient has 8 grandchildren. She attends the [employer].  She is a good friend of our patients [name]   ADVANCED DIRECTIVES: in place  HEALTH MAINTENANCE: History  Substance Use Topics  . Smoking status: Former Smoker    Quit date: 05/24/1959  . Smokeless tobacco: Never Used  . Alcohol Use: No     Colonoscopy: 2008  PAP: UTD  MM: refuses  Allergies  Allergen Reactions  . Avastin (Bevacizumab) Other (See Comments)    Swelling of the brain     Current Outpatient Prescriptions  Medication Sig Dispense Refill  . acyclovir (ZOVIRAX) 400 MG tablet Take 1 tablet (400 mg total) by mouth 2 (two) times daily.  60 tablet  2  . antiseptic oral rinse (BIOTENE) LIQD 15 mLs by Mouth Rinse route 2 (two) times daily.  1 Bottle  0  . cholecalciferol (VITAMIN D) 1000 UNITS tablet Take 1,000 Units by mouth 2 (two) times daily.      Marland Kitchen dexamethasone (DECADRON) 4 MG tablet Take 2 tablets (8 mg total) by mouth 2 (two) times daily with a meal. Take two times a day starting the day  after chemotherapy for 3 days.  30 tablet  1  . doxycycline (VIBRAMYCIN) 100 MG capsule Take 100 mg by mouth 2 (two) times daily.      . LevETIRAcetam (KEPPRA PO) Take 1 tablet by mouth 2 (two) times daily.      . nitrofurantoin, macrocrystal-monohydrate, (MACROBID) 100 MG capsule Take 1 tablet bid x 3 days then 1 tablet daily indifently  30 capsule  6  . omeprazole (PRILOSEC) 40 MG capsule Take 1 capsule (40 mg total) by mouth at bedtime.  30 capsule  2  . VIIBRYD 40 MG TABS        No current facility-administered medications for this visit.    OBJECTIVE: Elderly white woman who looks well and is in no acute distress  Filed Vitals:   07/31/12 1410  BP: 116/72  Pulse: 80  Temp: 97.8 F (36.6 C)  Resp: 20     Body mass index is 25.93 kg/(m^2).    ECOG FS: 1 Filed Weights   07/31/12 1410  Weight: 160 lb  9.6 oz (72.848 kg)   Sclerae unicteric Oropharynx clear, no ulcerations or evidence of candidiasis noted No cervical, supraclavicular, or axillary adenopathy Lungs clear to auscultation bilaterally, no wheezes, no rales or rhonchi Heart regular rate and rhythm Abdomen soft nondistended, nontender, positive bowel sounds, no masses palpated. MSK no focal spinal tenderness, no peripheral edema Neuro: nonfocal, pleasant affect, well oriented Breasts: Deferred    LAB RESULTS: Results for BETHANNIE, IGLEHART (MRN [id number]) as of 07/09/2012 11:27  Ref. Range 04/09/2012 11:19 04/27/2012 10:50 05/21/2012 08:13 05/28/2012 10:34 06/11/2012 08:27  CA 125 Latest Range: 0.0-30.2 U/mL 79.6 (H) 139.3 (H) 91.1 (H) 67.3 (H) 36.1 (H)    Lab Results  Component Value Date   CA125 36.1* 06/11/2012   Lab Results  Component Value Date   WBC 3.9 07/31/2012   NEUTROABS 2.5 07/31/2012   HGB 10.5* 07/31/2012   HCT 30.2* 07/31/2012   MCV 110.1* 07/31/2012   PLT 143* 07/31/2012      Chemistry      Component Value Date/Time   NA 140 07/31/2012 1345   NA 136 10/17/2011 1118   NA 143 05/12/2011 1337   K 4.3  07/31/2012 1345   K 3.9 10/17/2011 1118   K 4.1 05/12/2011 1337   CL 103 07/31/2012 1345   CL 100 10/17/2011 1118   CL 92* 05/12/2011 1337   CO2 25 07/31/2012 1345   CO2 27 10/17/2011 1118   CO2 32 05/12/2011 1337   BUN 36.7* 07/31/2012 1345   BUN 25* 10/17/2011 1118   BUN 19 05/12/2011 1337   CREATININE 1.5* 07/31/2012 1345   CREATININE 1.10 10/17/2011 1118   CREATININE 1.3* 05/12/2011 1337      Component Value Date/Time   CALCIUM 9.7 07/31/2012 1345   CALCIUM 9.8 10/17/2011 1118   CALCIUM 9.8 05/12/2011 1337   ALKPHOS 99 07/31/2012 1345   ALKPHOS 109 10/17/2011 1118   ALKPHOS 95* 05/12/2011 1337   AST 19 07/31/2012 1345   AST 22 10/17/2011 1118   AST 40* 05/12/2011 1337   ALT 17 07/31/2012 1345   ALT 14 10/17/2011 1118   BILITOT 0.65 07/31/2012 1345   BILITOT 0.4 10/17/2011 1118   BILITOT 0.60 05/12/2011 1337      STUDIES: No results found.   ASSESSMENT: 79 y.o.  [place] woman   (1) status post optimal debulking of a primary peritoneal serous adenocarcinoma September 2010, the tumor being moderately differentiated, pT3c NX (stage IIIC), treated according to GOG 252 with  intraperitoneal platinum and paclitaxel x4 given along with bevacizumab, complicated by SIADHafter cycle 4, also with posterior reversible leukoencephalopathy.  After these problems resolved she received 2 additional cycles of single-agent carboplatin completed in March 2011  (2)  She had her first recurrence February 2012  treated with carboplatin and Gemzar for a total of 6 cycles between February and June 2012.  Her CEA-125 had normalized before the beginning of cycle 5.   (3) second recurrence documented February 2013,  treated with carboplatin/ doxil x 6, completed 10/03/2011, with a good response noted on restaging scans  (4) third recurrence documented February 2014, receiving single-agent carboplatin every 3 weeks, first dose 04/30/2012  PLAN:  Zaila continues to tolerate treatment very well, and we'll proceed for day 1  cycle 5 of carboplatin today as scheduled. She'll be scheduled for days 4, 5, 7, and 9 for supportive IV fluids. I will see her again in 3 weeks in anticipation of her sixth and final dose of carboplatin, and she will  receive fluids on days 3 and 5 following that cycle. This is all per Celestia's request, and her daughters are in agreement with this plan.  The plan is to continue treatment to a total of 6 doses, and then consider stopping. We will obtain a CT scan July 11 and she will see Dr. Darnelle Catalan July 14 to review those results. She knows to call for any problems that may develop before the next visit. Aidden Markovic    07/31/2012

## 2012-07-31 NOTE — Telephone Encounter (Signed)
Per staff message and POF I have scheduled appts.  JMW  

## 2012-07-31 NOTE — Patient Instructions (Addendum)
[facility] Discharge Instructions for Patients Receiving Chemotherapy  Today you received the following chemotherapy agents Carboplatin.  To help prevent nausea and vomiting after your treatment, we encourage you to take your nausea medication as prescribed.    If you develop nausea and vomiting that is not controlled by your nausea medication, call the clinic. If it is after clinic hours your family physician or the after hours number for the clinic or go to the Emergency Department.   BELOW ARE SYMPTOMS THAT SHOULD BE REPORTED IMMEDIATELY:  *FEVER GREATER THAN 100.5 F  *CHILLS WITH OR WITHOUT FEVER  NAUSEA AND VOMITING THAT IS NOT CONTROLLED WITH YOUR NAUSEA MEDICATION  *UNUSUAL SHORTNESS OF BREATH  *UNUSUAL BRUISING OR BLEEDING  TENDERNESS IN MOUTH AND THROAT WITH OR WITHOUT PRESENCE OF ULCERS  *URINARY PROBLEMS  *BOWEL PROBLEMS  UNUSUAL RASH Items with * indicate a potential emergency and should be followed up as soon as possible.  Please let the nurse know about any problems that you may have experienced. Feel free to call the clinic you have any questions or concerns. The clinic phone number is [phone].   I have been informed and understand all the instructions given to me. I know to contact the clinic, my physician, or go to the Emergency Department if any problems should occur. I do not have any questions at this time, but understand that I may call the clinic during office hours   should I have any questions or need assistance in obtaining follow up care.    __________________________________________  _____________  __________ Signature of Patient or Authorized Representative            Date                   Time    __________________________________________ Nurse's Signature

## 2012-08-02 ENCOUNTER — Telehealth: Payer: Self-pay | Admitting: Oncology

## 2012-08-03 ENCOUNTER — Ambulatory Visit (HOSPITAL_BASED_OUTPATIENT_CLINIC_OR_DEPARTMENT_OTHER): Payer: Medicare Other

## 2012-08-03 VITALS — BP 137/71 | HR 66 | Temp 97.6°F | Resp 18

## 2012-08-03 DIAGNOSIS — C569 Malignant neoplasm of unspecified ovary: Secondary | ICD-10-CM

## 2012-08-03 DIAGNOSIS — E860 Dehydration: Secondary | ICD-10-CM

## 2012-08-03 DIAGNOSIS — Z5189 Encounter for other specified aftercare: Secondary | ICD-10-CM

## 2012-08-03 MED ORDER — SODIUM CHLORIDE 0.9 % IV SOLN
Freq: Once | INTRAVENOUS | Status: AC
  Administered 2012-08-03: 15:00:00 via INTRAVENOUS

## 2012-08-03 NOTE — Patient Instructions (Addendum)
Dehydration, Adult Dehydration is when you lose more fluids from the body than you take in. Vital organs like the kidneys, brain, and heart cannot function without a proper amount of fluids and salt. Any loss of fluids from the body can cause dehydration.  CAUSES   Vomiting.  Diarrhea.  Excessive sweating.  Excessive urine output.  Fever. SYMPTOMS  Mild dehydration  Thirst.  Dry lips.  Slightly dry mouth. Moderate dehydration  Very dry mouth.  Sunken eyes.  Skin does not bounce back quickly when lightly pinched and released.  Dark urine and decreased urine production.  Decreased tear production.  Headache. Severe dehydration  Very dry mouth.  Extreme thirst.  Rapid, weak pulse (more than 100 beats per minute at rest).  Cold hands and feet.  Not able to sweat in spite of heat and temperature.  Rapid breathing.  Blue lips.  Confusion and lethargy.  Difficulty being awakened.  Minimal urine production.  No tears. DIAGNOSIS  Your caregiver will diagnose dehydration based on your symptoms and your exam. Blood and urine tests will help confirm the diagnosis. The diagnostic evaluation should also identify the cause of dehydration. TREATMENT  Treatment of mild or moderate dehydration can often be done at home by increasing the amount of fluids that you drink. It is best to drink small amounts of fluid more often. Drinking too much at one time can make vomiting worse. Refer to the home care instructions below. Severe dehydration needs to be treated at the hospital where you will probably be given intravenous (IV) fluids that contain water and electrolytes. HOME CARE INSTRUCTIONS   Ask your caregiver about specific rehydration instructions.  Drink enough fluids to keep your urine clear or pale yellow.  Drink small amounts frequently if you have nausea and vomiting.  Eat as you normally do.  Avoid:  Foods or drinks high in sugar.  Carbonated  drinks.  Juice.  Extremely hot or cold fluids.  Drinks with caffeine.  Fatty, greasy foods.  Alcohol.  Tobacco.  Overeating.  Gelatin desserts.  Wash your hands well to avoid spreading bacteria and viruses.  Only take over-the-counter or prescription medicines for pain, discomfort, or fever as directed by your caregiver.  Ask your caregiver if you should continue all prescribed and over-the-counter medicines.  Keep all follow-up appointments with your caregiver. SEEK MEDICAL CARE IF:  You have abdominal pain and it increases or stays in one area (localizes).  You have a rash, stiff neck, or severe headache.  You are irritable, sleepy, or difficult to awaken.  You are weak, dizzy, or extremely thirsty. SEEK IMMEDIATE MEDICAL CARE IF:   You are unable to keep fluids down or you get worse despite treatment.  You have frequent episodes of vomiting or diarrhea.  You have blood or green matter (bile) in your vomit.  You have blood in your stool or your stool looks black and tarry.  You have not urinated in 6 to 8 hours, or you have only urinated a small amount of very dark urine.  You have a fever.  You faint. MAKE SURE YOU:   Understand these instructions.  Will watch your condition.  Will get help right away if you are not doing well or get worse. Document Released: 02/21/2005 Document Revised: 05/16/2011 Document Reviewed: 10/11/2010 Exit[employer] Patient Information 2014 [employer].  

## 2012-08-03 NOTE — Progress Notes (Signed)
1420 VSS. Patient with no complaints. Discharged with daughter, ambulating. AVS provided.

## 2012-08-06 ENCOUNTER — Other Ambulatory Visit: Payer: Self-pay | Admitting: Medical Oncology

## 2012-08-06 ENCOUNTER — Encounter (HOSPITAL_COMMUNITY): Payer: Self-pay

## 2012-08-06 ENCOUNTER — Other Ambulatory Visit: Payer: Self-pay | Admitting: *Deleted

## 2012-08-06 ENCOUNTER — Ambulatory Visit (HOSPITAL_BASED_OUTPATIENT_CLINIC_OR_DEPARTMENT_OTHER): Payer: Medicare Other

## 2012-08-06 ENCOUNTER — Ambulatory Visit (HOSPITAL_COMMUNITY)
Admission: RE | Admit: 2012-08-06 | Discharge: 2012-08-06 | Disposition: A | Discharge status: Home / Self Care | Payer: Medicare Other | Source: Ambulatory Visit | Attending: Oncology | Admitting: Oncology

## 2012-08-06 VITALS — BP 124/68 | HR 74 | Temp 97.4°F | Resp 20

## 2012-08-06 DIAGNOSIS — C569 Malignant neoplasm of unspecified ovary: Secondary | ICD-10-CM

## 2012-08-06 DIAGNOSIS — Z5189 Encounter for other specified aftercare: Secondary | ICD-10-CM

## 2012-08-06 DIAGNOSIS — R944 Abnormal results of kidney function studies: Secondary | ICD-10-CM

## 2012-08-06 DIAGNOSIS — G9389 Other specified disorders of brain: Secondary | ICD-10-CM | POA: Insufficient documentation

## 2012-08-06 DIAGNOSIS — G40909 Epilepsy, unspecified, not intractable, without status epilepticus: Secondary | ICD-10-CM

## 2012-08-06 DIAGNOSIS — G319 Degenerative disease of nervous system, unspecified: Secondary | ICD-10-CM | POA: Insufficient documentation

## 2012-08-06 DIAGNOSIS — R51 Headache: Secondary | ICD-10-CM | POA: Insufficient documentation

## 2012-08-06 DIAGNOSIS — E860 Dehydration: Secondary | ICD-10-CM

## 2012-08-06 DIAGNOSIS — I10 Essential (primary) hypertension: Secondary | ICD-10-CM | POA: Insufficient documentation

## 2012-08-06 DIAGNOSIS — W1809XA Striking against other object with subsequent fall, initial encounter: Secondary | ICD-10-CM | POA: Insufficient documentation

## 2012-08-06 MED ORDER — SODIUM CHLORIDE 0.9 % IJ SOLN
10.0000 mL | INTRAMUSCULAR | Status: DC | PRN
  Administered 2012-08-06: 10 mL via INTRAVENOUS
  Filled 2012-08-06: qty 10

## 2012-08-06 MED ORDER — SODIUM CHLORIDE 0.9 % IV SOLN
Freq: Once | INTRAVENOUS | Status: AC
  Administered 2012-08-06: 15:00:00 via INTRAVENOUS

## 2012-08-06 MED ORDER — HEPARIN SOD (PORK) LOCK FLUSH 100 UNIT/ML IV SOLN
500.0000 [IU] | Freq: Once | INTRAVENOUS | Status: AC
  Administered 2012-08-06: 500 [IU] via INTRAVENOUS
  Filled 2012-08-06: qty 5

## 2012-08-06 NOTE — Patient Instructions (Addendum)
Dehydration, Adult Dehydration is when you lose more fluids from the body than you take in. Vital organs like the kidneys, brain, and heart cannot function without a proper amount of fluids and salt. Any loss of fluids from the body can cause dehydration.  CAUSES   Vomiting.  Diarrhea.  Excessive sweating.  Excessive urine output.  Fever. SYMPTOMS  Mild dehydration  Thirst.  Dry lips.  Slightly dry mouth. Moderate dehydration  Very dry mouth.  Sunken eyes.  Skin does not bounce back quickly when lightly pinched and released.  Dark urine and decreased urine production.  Decreased tear production.  Headache. Severe dehydration  Very dry mouth.  Extreme thirst.  Rapid, weak pulse (more than 100 beats per minute at rest).  Cold hands and feet.  Not able to sweat in spite of heat and temperature.  Rapid breathing.  Blue lips.  Confusion and lethargy.  Difficulty being awakened.  Minimal urine production.  No tears. DIAGNOSIS  Your caregiver will diagnose dehydration based on your symptoms and your exam. Blood and urine tests will help confirm the diagnosis. The diagnostic evaluation should also identify the cause of dehydration. TREATMENT  Treatment of mild or moderate dehydration can often be done at home by increasing the amount of fluids that you drink. It is best to drink small amounts of fluid more often. Drinking too much at one time can make vomiting worse. Refer to the home care instructions below. Severe dehydration needs to be treated at the hospital where you will probably be given intravenous (IV) fluids that contain water and electrolytes. HOME CARE INSTRUCTIONS   Ask your caregiver about specific rehydration instructions.  Drink enough fluids to keep your urine clear or pale yellow.  Drink small amounts frequently if you have nausea and vomiting.  Eat as you normally do.  Avoid:  Foods or drinks high in sugar.  Carbonated  drinks.  Juice.  Extremely hot or cold fluids.  Drinks with caffeine.  Fatty, greasy foods.  Alcohol.  Tobacco.  Overeating.  Gelatin desserts.  Wash your hands well to avoid spreading bacteria and viruses.  Only take over-the-counter or prescription medicines for pain, discomfort, or fever as directed by your caregiver.  Ask your caregiver if you should continue all prescribed and over-the-counter medicines.  Keep all follow-up appointments with your caregiver. SEEK MEDICAL CARE IF:  You have abdominal pain and it increases or stays in one area (localizes).  You have a rash, stiff neck, or severe headache.  You are irritable, sleepy, or difficult to awaken.  You are weak, dizzy, or extremely thirsty. SEEK IMMEDIATE MEDICAL CARE IF:   You are unable to keep fluids down or you get worse despite treatment.  You have frequent episodes of vomiting or diarrhea.  You have blood or green matter (bile) in your vomit.  You have blood in your stool or your stool looks black and tarry.  You have not urinated in 6 to 8 hours, or you have only urinated a small amount of very dark urine.  You have a fever.  You faint. MAKE SURE YOU:   Understand these instructions.  Will watch your condition.  Will get help right away if you are not doing well or get worse. Document Released: 02/21/2005 Document Revised: 05/16/2011 Document Reviewed: 10/11/2010 Exit[employer] Patient Information 2014 [employer].  

## 2012-08-06 NOTE — Progress Notes (Signed)
This RN spoke with pt and daughter in law per concerns over the weekend. Pt states she has has small amount of vaginal bleeding. Mostly post voiding with wiping but does have some intermittant as well. She reports no bleeding today.  Yesterday pt fell and hit her head. Area of discomfort noted in back right occipatel. Today " bump " is gone but pt reports tenderness.  Per MD review recommendation given to obtain a noncontrast CT of the head and for pt to monitor bleeding and report if returns.  Pt made aware of above.

## 2012-08-07 ENCOUNTER — Ambulatory Visit: Payer: Medicare Other

## 2012-08-08 ENCOUNTER — Ambulatory Visit (HOSPITAL_BASED_OUTPATIENT_CLINIC_OR_DEPARTMENT_OTHER): Payer: Medicare Other

## 2012-08-08 VITALS — BP 113/84 | HR 88 | Temp 98.4°F | Resp 20

## 2012-08-08 DIAGNOSIS — C569 Malignant neoplasm of unspecified ovary: Secondary | ICD-10-CM

## 2012-08-08 DIAGNOSIS — E860 Dehydration: Secondary | ICD-10-CM

## 2012-08-08 MED ORDER — SODIUM CHLORIDE 0.9 % IV SOLN
INTRAVENOUS | Status: DC
  Administered 2012-08-08: 11:00:00 via INTRAVENOUS

## 2012-08-08 MED ORDER — SODIUM CHLORIDE 0.9 % IJ SOLN
10.0000 mL | INTRAMUSCULAR | Status: DC | PRN
  Administered 2012-08-08: 10 mL via INTRAVENOUS
  Filled 2012-08-08: qty 10

## 2012-08-08 MED ORDER — HEPARIN SOD (PORK) LOCK FLUSH 100 UNIT/ML IV SOLN
500.0000 [IU] | Freq: Once | INTRAVENOUS | Status: AC
  Administered 2012-08-08: 500 [IU] via INTRAVENOUS
  Filled 2012-08-08: qty 5

## 2012-08-08 NOTE — Patient Instructions (Addendum)
Dehydration, Adult Dehydration is when you lose more fluids from the body than you take in. Vital organs like the kidneys, brain, and heart cannot function without a proper amount of fluids and salt. Any loss of fluids from the body can cause dehydration.  CAUSES   Vomiting.  Diarrhea.  Excessive sweating.  Excessive urine output.  Fever. SYMPTOMS  Mild dehydration  Thirst.  Dry lips.  Slightly dry mouth. Moderate dehydration  Very dry mouth.  Sunken eyes.  Skin does not bounce back quickly when lightly pinched and released.  Dark urine and decreased urine production.  Decreased tear production.  Headache. Severe dehydration  Very dry mouth.  Extreme thirst.  Rapid, weak pulse (more than 100 beats per minute at rest).  Cold hands and feet.  Not able to sweat in spite of heat and temperature.  Rapid breathing.  Blue lips.  Confusion and lethargy.  Difficulty being awakened.  Minimal urine production.  No tears. DIAGNOSIS  Your caregiver will diagnose dehydration based on your symptoms and your exam. Blood and urine tests will help confirm the diagnosis. The diagnostic evaluation should also identify the cause of dehydration. TREATMENT  Treatment of mild or moderate dehydration can often be done at home by increasing the amount of fluids that you drink. It is best to drink small amounts of fluid more often. Drinking too much at one time can make vomiting worse. Refer to the home care instructions below. Severe dehydration needs to be treated at the hospital where you will probably be given intravenous (IV) fluids that contain water and electrolytes. HOME CARE INSTRUCTIONS   Ask your caregiver about specific rehydration instructions.  Drink enough fluids to keep your urine clear or pale yellow.  Drink small amounts frequently if you have nausea and vomiting.  Eat as you normally do.  Avoid:  Foods or drinks high in sugar.  Carbonated  drinks.  Juice.  Extremely hot or cold fluids.  Drinks with caffeine.  Fatty, greasy foods.  Alcohol.  Tobacco.  Overeating.  Gelatin desserts.  Wash your hands well to avoid spreading bacteria and viruses.  Only take over-the-counter or prescription medicines for pain, discomfort, or fever as directed by your caregiver.  Ask your caregiver if you should continue all prescribed and over-the-counter medicines.  Keep all follow-up appointments with your caregiver. SEEK MEDICAL CARE IF:  You have abdominal pain and it increases or stays in one area (localizes).  You have a rash, stiff neck, or severe headache.  You are irritable, sleepy, or difficult to awaken.  You are weak, dizzy, or extremely thirsty. SEEK IMMEDIATE MEDICAL CARE IF:   You are unable to keep fluids down or you get worse despite treatment.  You have frequent episodes of vomiting or diarrhea.  You have blood or green matter (bile) in your vomit.  You have blood in your stool or your stool looks black and tarry.  You have not urinated in 6 to 8 hours, or you have only urinated a small amount of very dark urine.  You have a fever.  You faint. MAKE SURE YOU:   Understand these instructions.  Will watch your condition.  Will get help right away if you are not doing well or get worse. Document Released: 02/21/2005 Document Revised: 05/16/2011 Document Reviewed: 10/11/2010 Exit[employer] Patient Information 2014 [employer].  

## 2012-08-09 ENCOUNTER — Ambulatory Visit: Payer: Medicare Other

## 2012-08-10 ENCOUNTER — Ambulatory Visit: Payer: Medicare Other

## 2012-08-13 ENCOUNTER — Telehealth: Payer: Self-pay | Admitting: *Deleted

## 2012-08-13 NOTE — Telephone Encounter (Signed)
Per the patient's request I have moved appt on 6/18 and 6/20 to earlier in the day. Patient's daughter called. JMW

## 2012-08-14 ENCOUNTER — Telehealth: Payer: Self-pay | Admitting: *Deleted

## 2012-08-14 NOTE — Telephone Encounter (Signed)
Per daughter's request I have moved appt for 6/20 to a later appt. JMW

## 2012-08-17 ENCOUNTER — Other Ambulatory Visit: Payer: Self-pay | Admitting: *Deleted

## 2012-08-17 DIAGNOSIS — K219 Gastro-esophageal reflux disease without esophagitis: Secondary | ICD-10-CM

## 2012-08-17 DIAGNOSIS — C569 Malignant neoplasm of unspecified ovary: Secondary | ICD-10-CM

## 2012-08-17 MED ORDER — OMEPRAZOLE 40 MG PO CPDR: 40.0000 mg | DELAYED_RELEASE_CAPSULE | Freq: Every day | ORAL | Status: DC

## 2012-08-20 ENCOUNTER — Ambulatory Visit (HOSPITAL_BASED_OUTPATIENT_CLINIC_OR_DEPARTMENT_OTHER): Payer: Medicare Other | Admitting: Physician Assistant

## 2012-08-20 ENCOUNTER — Telehealth: Payer: Self-pay | Admitting: Oncology

## 2012-08-20 ENCOUNTER — Ambulatory Visit (HOSPITAL_BASED_OUTPATIENT_CLINIC_OR_DEPARTMENT_OTHER): Payer: Medicare Other

## 2012-08-20 ENCOUNTER — Other Ambulatory Visit (HOSPITAL_BASED_OUTPATIENT_CLINIC_OR_DEPARTMENT_OTHER): Payer: Medicare Other | Admitting: Lab

## 2012-08-20 ENCOUNTER — Encounter: Payer: Self-pay | Admitting: Physician Assistant

## 2012-08-20 ENCOUNTER — Telehealth: Payer: Self-pay | Admitting: *Deleted

## 2012-08-20 VITALS — BP 135/76 | HR 83 | Temp 98.3°F | Resp 20 | Ht 66.0 in | Wt 163.0 lb

## 2012-08-20 DIAGNOSIS — C569 Malignant neoplasm of unspecified ovary: Secondary | ICD-10-CM

## 2012-08-20 DIAGNOSIS — Z5111 Encounter for antineoplastic chemotherapy: Secondary | ICD-10-CM

## 2012-08-20 DIAGNOSIS — D696 Thrombocytopenia, unspecified: Secondary | ICD-10-CM

## 2012-08-20 DIAGNOSIS — D649 Anemia, unspecified: Secondary | ICD-10-CM

## 2012-08-20 DIAGNOSIS — E860 Dehydration: Secondary | ICD-10-CM

## 2012-08-20 LAB — CBC WITH DIFFERENTIAL/PLATELET
Basophils Absolute: 0 10*3/uL (ref 0.0–0.1)
EOS%: 0.7 % (ref 0.0–7.0)
Eosinophils Absolute: 0 10*3/uL (ref 0.0–0.5)
LYMPH%: 21.4 % (ref 14.0–49.7)
MCH: 37.4 pg — ABNORMAL HIGH (ref 25.1–34.0)
MCV: 109.8 fL — ABNORMAL HIGH (ref 79.5–101.0)
MONO%: 10.5 % (ref 0.0–14.0)
NEUT#: 1.9 10*3/uL (ref 1.5–6.5)
Platelets: 78 10*3/uL — ABNORMAL LOW (ref 145–400)
RBC: 2.65 10*6/uL — ABNORMAL LOW (ref 3.70–5.45)
nRBC: 0 % (ref 0–0)

## 2012-08-20 LAB — COMPREHENSIVE METABOLIC PANEL (CC13)
ALT: 13 U/L (ref 0–55)
CO2: 24 mEq/L (ref 22–29)
Chloride: 100 mEq/L (ref 98–107)
Potassium: 4.3 mEq/L (ref 3.5–5.1)
Sodium: 134 mEq/L — ABNORMAL LOW (ref 136–145)
Total Bilirubin: 0.46 mg/dL (ref 0.20–1.20)
Total Protein: 6.3 g/dL — ABNORMAL LOW (ref 6.4–8.3)

## 2012-08-20 MED ORDER — SODIUM CHLORIDE 0.9 % IV SOLN
310.0000 mg | Freq: Once | INTRAVENOUS | Status: AC
  Administered 2012-08-20: 310 mg via INTRAVENOUS
  Filled 2012-08-20: qty 31

## 2012-08-20 MED ORDER — SODIUM CHLORIDE 0.9 % IJ SOLN
10.0000 mL | INTRAMUSCULAR | Status: DC | PRN
  Administered 2012-08-20: 10 mL
  Filled 2012-08-20: qty 10

## 2012-08-20 MED ORDER — ONDANSETRON 16 MG/50ML IVPB (CHCC)
16.0000 mg | Freq: Once | INTRAVENOUS | Status: AC
  Administered 2012-08-20: 16 mg via INTRAVENOUS

## 2012-08-20 MED ORDER — SODIUM CHLORIDE 0.9 % IV SOLN
Freq: Once | INTRAVENOUS | Status: AC
  Administered 2012-08-20: 13:00:00 via INTRAVENOUS

## 2012-08-20 MED ORDER — CARBOPLATIN CHEMO INTRADERMAL TEST DOSE 100MCG/0.02ML
100.0000 ug | Freq: Once | INTRADERMAL | Status: AC
  Administered 2012-08-20: 100 ug via INTRADERMAL
  Filled 2012-08-20: qty 0.01

## 2012-08-20 MED ORDER — DEXAMETHASONE SODIUM PHOSPHATE 20 MG/5ML IJ SOLN
20.0000 mg | Freq: Once | INTRAMUSCULAR | Status: AC
  Administered 2012-08-20: 20 mg via INTRAVENOUS

## 2012-08-20 MED ORDER — HEPARIN SOD (PORK) LOCK FLUSH 100 UNIT/ML IV SOLN
500.0000 [IU] | Freq: Once | INTRAVENOUS | Status: AC | PRN
  Administered 2012-08-20: 500 [IU]
  Filled 2012-08-20: qty 5

## 2012-08-20 NOTE — Patient Instructions (Signed)
[facility] Discharge Instructions for Patients Receiving Chemotherapy  Today you received the following chemotherapy agent Carboplatin.  To help prevent nausea and vomiting after your treatment, we encourage you to take your nausea medication.   If you develop nausea and vomiting that is not controlled by your nausea medication, call the clinic.   BELOW ARE SYMPTOMS THAT SHOULD BE REPORTED IMMEDIATELY:  *FEVER GREATER THAN 100.5 F  *CHILLS WITH OR WITHOUT FEVER  NAUSEA AND VOMITING THAT IS NOT CONTROLLED WITH YOUR NAUSEA MEDICATION  *UNUSUAL SHORTNESS OF BREATH  *UNUSUAL BRUISING OR BLEEDING  TENDERNESS IN MOUTH AND THROAT WITH OR WITHOUT PRESENCE OF ULCERS  *URINARY PROBLEMS  *BOWEL PROBLEMS  UNUSUAL RASH Items with * indicate a potential emergency and should be followed up as soon as possible.  Feel free to call the clinic you have any questions or concerns. The clinic phone number is [phone].

## 2012-08-20 NOTE — Progress Notes (Signed)
1300 Ok to treat per Zollie Scale, NP with Plts at 78  1418 Carbo intradermal test unremarkable at 5, 15 and 30 minutes.

## 2012-08-20 NOTE — Telephone Encounter (Signed)
Per staff message and POF I have scheduled appts.  JMW  

## 2012-08-20 NOTE — Progress Notes (Signed)
ID: Regina West   DOB: 01/04/1930  MR#: [id number]  WUJ#:[id number]  PCP: Minda Meo, MD GYN: SU: Ivar Drape OTHER MD: Porfirio Mylar Dohmeier  HISTORY OF PRESENT ILLNESS: The patient originally presented in the summer of 2010 with cramps and abdominal distension.  I do not have a copy of the initial evaluation, but on November 26, 2008, the patient underwent optimal debulking with bilateral salpingo-oophorectomy, omentectomy, and the placement of an intraperitoneal port.  The pathology from that procedure (Accession Number [id number] at [facility]) showed first of all, significant involvement of the omentum, minimal involvement of the right ovary and right fallopian tube and negative on the left ovary and fallopian tube, with neither ovary being enlarged, consistent with a primary peritoneal serous adenocarcinoma, described as moderately differentiated.  The sample included no lymph nodes.    The patient had an intraperitoneal port placed in the same surgery and was treated with intraperitoneal and IV chemotherapy according to GOG-252 but I am not sure which arm she was in.  (Arm 2 did carboplatin intraperitoneally and Taxol IV.  Arm 3 did cisplatin and paclitaxel intraperitoneally with paclitaxel IV.)  All arms received bevacizumab.  Unfortunately, after 4 cycles of treatment, she had acute mental status changes and was admitted here January of 2011 with what proved to be posterior reversible leukoencephalopathy and SIADH.  She had seizures, aphasia, and required intubation.  Once the patient recovered from this, she was treated with 2 cycles of single-agent carboplatin (I do not have the AUC). Her last adjuvant treatment was May 12, 2009, and her CA-125 at that time was 10.3.  Her intraperitoneal port was removed in April of 2011.    More recently, the patient was in routine follow up when her CA-125 was found to jump up to 2,269.7 (March 26, 2010).  This is 10 months after her last prior chemotherapy.   She had CTs of the chest, abdomen and pelvis January 26th which showed ascites and enhancing peritoneal nodularity. She had had similar findings at presentation but these had completely resolved by the time she finished treatment in March of 2011. The patient was felt to be in first relapse. Subsequent treatments are as summarized below   INTERVAL HISTORY:  [name] returns today with her daughter-in-law for followup of Regina West's ovarian cancer. She is due for day 1 cycle 6 of 6 planned q. three-week doses of carboplatin today. She receives supportive IV fluids for several days following treatment due to history of dehydration, accompanied by elevated serum creatinine.  Overall, Regina West is feeling well today. She had some problems with dehydration once again following cycle 5. This tends to be associated with some orthostatic hypotension. On one occasion (on May 31st) she stood up too quickly, felt dizzy, and fell backwards. She hit her head, and subsequently we obtained a head CT on 08/06/2012 which fortunately was unremarkable. She's had no additional falls, and denies any abnormal headaches. She is trying hard to keep herself well hydrated.   REVIEW OF SYSTEMS:  Regina West has had no recent fevers or chills. Her energy level is fair.  She's had no nausea. She's having regular bowel movements and denies any change in urinary habits. She had a very brief episode of abnormal vaginal spotting a couple of weeks ago. This occurred on one occasion, then resolved. She's had no additional abnormal bleeding whatsoever. She does bruise easily. She's had no cough, phlegm production, orthopnea, chest pain, palpitations. She does have some shortness of breath with exertion alone. She's  had no unusual myalgias, arthralgias, bony pain, or peripheral swelling. She also denies any abdominal or pelvic pain.  A detailed review of systems is otherwise stable and noncontributory.   PAST MEDICAL HISTORY: Significant for  hysterectomy at the age of 16 with concurrent bladder "tuck up."  She also underwent appendectomy during that procedure.  She is status post prior bilateral cataract surgery. She is status post tonsillectomy and adenoidectomy.  She has a history of hypertension and diabetes. She has a history of diverticulosis.  She has a large hiatal hernia.  She has degenerative disk disease.  She has been noted to have coronary calcifications and she has hyperlipidemia.  There is a history of remote tobacco abuse. Seizure disorder as per HPI  FAMILY HISTORY The patient's father died at the age of 4 after heart surgery for aortic stenosis.  The patient's mother died at the age of 65.  The patient has one brother in good health.  There is no other family history to her knowledge of ovarian or breast cancer.    GYNECOLOGIC HISTORY: Menarche at age 48.  She is GX P3 with menopause in her late 11s.  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 She is currently residing in [place]. Her husband was in sales and had a history of Parkinson's disease.  He died following a fall in the year 2000.  The patient's son Regina West lives in [place], and has a history of MS.  Daughter Regina West lives in [place]. She is a Futures trader.  Son Regina West lives in [place], [place] and is in Airline pilot. The patient has 8 grandchildren. She attends the [employer].  She is a good friend of our patients [name]   ADVANCED DIRECTIVES: in place  HEALTH MAINTENANCE: History  Substance Use Topics  . Smoking status: Former Smoker    Quit date: 05/24/1959  . Smokeless tobacco: Never Used  . Alcohol Use: No     Colonoscopy: 2008  PAP: UTD  MM: refuses  Allergies  Allergen Reactions  . Avastin (Bevacizumab) Other (See Comments)    Swelling of the brain     Current Outpatient Prescriptions  Medication Sig  Dispense Refill  . acyclovir (ZOVIRAX) 400 MG tablet Take 1 tablet (400 mg total) by mouth 2 (two) times daily.  60 tablet  2  . antiseptic oral rinse (BIOTENE) LIQD 15 mLs by Mouth Rinse route 2 (two) times daily.  1 Bottle  0  . cholecalciferol (VITAMIN D) 1000 UNITS tablet Take 1,000 Units by mouth 2 (two) times daily.      Marland Kitchen dexamethasone (DECADRON) 4 MG tablet Take 2 tablets (8 mg total) by mouth 2 (two) times daily with a meal. Take two times a day starting the day after chemotherapy for 3 days.  30 tablet  1  . doxycycline (VIBRAMYCIN) 100 MG capsule Take 100 mg by mouth 2 (two) times daily.      . LevETIRAcetam (KEPPRA PO) Take 1 tablet by mouth 2 (two) times daily.      . nitrofurantoin, macrocrystal-monohydrate, (MACROBID) 100 MG capsule Take 1 tablet bid x 3 days then 1 tablet daily indifently  30 capsule  6  . omeprazole (PRILOSEC) 40 MG capsule Take 1 capsule (40 mg total) by mouth at bedtime.  30 capsule  4  . VIIBRYD 40 MG TABS  No current facility-administered medications for this visit.   Facility-Administered Medications Ordered in Other Visits  Medication Dose Route Frequency Provider Last Rate Last Dose  . CARBOplatin CHEMO intradermal Test Dose 100 mcg/0.4ml  100 mcg Intradermal Once Lowella Dell, MD        OBJECTIVE: Elderly white woman who looks well and is in no acute distress  Filed Vitals:   08/20/12 1151  BP: 135/76  Pulse: 83  Temp: 98.3 F (36.8 C)  Resp: 20     Body mass index is 26.32 kg/(m^2).    ECOG FS: 1 Filed Weights   08/20/12 1151  Weight: 163 lb (73.936 kg)   Sclerae unicteric Oropharynx clear, no ulcerations or evidence of candidiasis noted, buccal mucosa is pink and moist No cervical, supraclavicular, or axillary adenopathy Lungs clear to auscultation bilaterally, no wheezes, no rales or rhonchi Heart regular rate and rhythm Abdomen soft nondistended, nontender to palpation, positive bowel sounds, no masses palpated. MSK no  focal spinal tenderness, no peripheral edema Neuro: nonfocal, pleasant affect, well oriented Breasts: Deferred    LAB RESULTS:   Lab Results  Component Value Date   CA125 12.9 07/31/2012   Lab Results  Component Value Date   WBC 2.8* 08/20/2012   NEUTROABS 1.9 08/20/2012   HGB 9.9* 08/20/2012   HCT 29.1* 08/20/2012   MCV 109.8* 08/20/2012   PLT 78* 08/20/2012      Chemistry      Component Value Date/Time   NA 140 07/31/2012 1345   NA 136 10/17/2011 1118   NA 143 05/12/2011 1337   K 4.3 07/31/2012 1345   K 3.9 10/17/2011 1118   K 4.1 05/12/2011 1337   CL 103 07/31/2012 1345   CL 100 10/17/2011 1118   CL 92* 05/12/2011 1337   CO2 25 07/31/2012 1345   CO2 27 10/17/2011 1118   CO2 32 05/12/2011 1337   BUN 36.7* 07/31/2012 1345   BUN 25* 10/17/2011 1118   BUN 19 05/12/2011 1337   CREATININE 1.5* 07/31/2012 1345   CREATININE 1.10 10/17/2011 1118   CREATININE 1.3* 05/12/2011 1337      Component Value Date/Time   CALCIUM 9.7 07/31/2012 1345   CALCIUM 9.8 10/17/2011 1118   CALCIUM 9.8 05/12/2011 1337   ALKPHOS 99 07/31/2012 1345   ALKPHOS 109 10/17/2011 1118   ALKPHOS 95* 05/12/2011 1337   AST 19 07/31/2012 1345   AST 22 10/17/2011 1118   AST 40* 05/12/2011 1337   ALT 17 07/31/2012 1345   ALT 14 10/17/2011 1118   BILITOT 0.65 07/31/2012 1345   BILITOT 0.4 10/17/2011 1118   BILITOT 0.60 05/12/2011 1337      STUDIES: No results found.   ASSESSMENT: 79 y.o.  [place] woman   (1) status post optimal debulking of a primary peritoneal serous adenocarcinoma September 2010, the tumor being moderately differentiated, pT3c NX (stage IIIC), treated according to GOG 252 with  intraperitoneal platinum and paclitaxel x4 given along with bevacizumab, complicated by SIADHafter cycle 4, also with posterior reversible leukoencephalopathy.  After these problems resolved she received 2 additional cycles of single-agent carboplatin completed in March 2011  (2)  She had her first recurrence February 2012  treated with  carboplatin and Gemzar for a total of 6 cycles between February and June 2012.  Her CEA-125 had normalized before the beginning of cycle 5.   (3) second recurrence documented February 2013,  treated with carboplatin/ doxil x 6, completed 10/03/2011, with a good response noted on restaging  scans  (4) third recurrence documented February 2014, receiving single-agent carboplatin every 3 weeks, first dose 04/30/2012  PLAN:  I have reviewed this case and Rosaly's labs with Dr. Darnelle Catalan today, and we both agree for Claris Che to proceed to treatment today as scheduled for her sixth and final dose of carboplatin despite her thrombocytopenia. We will follow her labs very closely. She scheduled for IV fluids on June 18 and June 20. We will repeat her CBC on June 20 as well. I am also adding labs and IV fluids on Monday, June 23, and labs only on June 30. This will be just prior to Maciel's trip out of town.   Our plan has been to complete a total of 6 doses, then consider stopping treatment. She's already scheduled for CT scan for assessment on July 11, and we'll see Dr. Darnelle Catalan to review those results on July 14. Sadeen voices understanding and agreement with our plan, and will call with any changes or problems.   Lyden Redner    08/20/2012

## 2012-08-22 ENCOUNTER — Ambulatory Visit (HOSPITAL_BASED_OUTPATIENT_CLINIC_OR_DEPARTMENT_OTHER): Payer: Medicare Other

## 2012-08-22 VITALS — BP 116/73 | HR 72 | Temp 98.3°F | Resp 16

## 2012-08-22 DIAGNOSIS — Z452 Encounter for adjustment and management of vascular access device: Secondary | ICD-10-CM

## 2012-08-22 DIAGNOSIS — Z5189 Encounter for other specified aftercare: Secondary | ICD-10-CM

## 2012-08-22 DIAGNOSIS — Z95828 Presence of other vascular implants and grafts: Secondary | ICD-10-CM

## 2012-08-22 DIAGNOSIS — E860 Dehydration: Secondary | ICD-10-CM

## 2012-08-22 DIAGNOSIS — C569 Malignant neoplasm of unspecified ovary: Secondary | ICD-10-CM

## 2012-08-22 MED ORDER — SODIUM CHLORIDE 0.9 % IV SOLN
1000.0000 mL | INTRAVENOUS | Status: DC
  Administered 2012-08-22: 12:00:00 via INTRAVENOUS

## 2012-08-22 MED ORDER — HEPARIN SOD (PORK) LOCK FLUSH 100 UNIT/ML IV SOLN
500.0000 [IU] | Freq: Once | INTRAVENOUS | Status: AC
  Administered 2012-08-22: 500 [IU] via INTRAVENOUS
  Filled 2012-08-22: qty 5

## 2012-08-22 MED ORDER — SODIUM CHLORIDE 0.9 % IJ SOLN
10.0000 mL | INTRAMUSCULAR | Status: DC | PRN
  Administered 2012-08-22: 10 mL via INTRAVENOUS
  Filled 2012-08-22: qty 10

## 2012-08-22 MED ORDER — ALTEPLASE 2 MG IJ SOLR
2.0000 mg | Freq: Once | INTRAMUSCULAR | Status: AC | PRN
  Administered 2012-08-22: 2 mg
  Filled 2012-08-22: qty 2

## 2012-08-22 NOTE — Patient Instructions (Addendum)
Dehydration, Adult Dehydration is when you lose more fluids from the body than you take in. Vital organs like the kidneys, brain, and heart cannot function without a proper amount of fluids and salt. Any loss of fluids from the body can cause dehydration.  CAUSES   Vomiting.  Diarrhea.  Excessive sweating.  Excessive urine output.  Fever. SYMPTOMS  Mild dehydration  Thirst.  Dry lips.  Slightly dry mouth. Moderate dehydration  Very dry mouth.  Sunken eyes.  Skin does not bounce back quickly when lightly pinched and released.  Dark urine and decreased urine production.  Decreased tear production.  Headache. Severe dehydration  Very dry mouth.  Extreme thirst.  Rapid, weak pulse (more than 100 beats per minute at rest).  Cold hands and feet.  Not able to sweat in spite of heat and temperature.  Rapid breathing.  Blue lips.  Confusion and lethargy.  Difficulty being awakened.  Minimal urine production.  No tears. DIAGNOSIS  Your caregiver will diagnose dehydration based on your symptoms and your exam. Blood and urine tests will help confirm the diagnosis. The diagnostic evaluation should also identify the cause of dehydration. TREATMENT  Treatment of mild or moderate dehydration can often be done at home by increasing the amount of fluids that you drink. It is best to drink small amounts of fluid more often. Drinking too much at one time can make vomiting worse. Refer to the home care instructions below. Severe dehydration needs to be treated at the hospital where you will probably be given intravenous (IV) fluids that contain water and electrolytes. HOME CARE INSTRUCTIONS   Ask your caregiver about specific rehydration instructions.  Drink enough fluids to keep your urine clear or pale yellow.  Drink small amounts frequently if you have nausea and vomiting.  Eat as you normally do.  Avoid:  Foods or drinks high in sugar.  Carbonated  drinks.  Juice.  Extremely hot or cold fluids.  Drinks with caffeine.  Fatty, greasy foods.  Alcohol.  Tobacco.  Overeating.  Gelatin desserts.  Wash your hands well to avoid spreading bacteria and viruses.  Only take over-the-counter or prescription medicines for pain, discomfort, or fever as directed by your caregiver.  Ask your caregiver if you should continue all prescribed and over-the-counter medicines.  Keep all follow-up appointments with your caregiver. SEEK MEDICAL CARE IF:  You have abdominal pain and it increases or stays in one area (localizes).  You have a rash, stiff neck, or severe headache.  You are irritable, sleepy, or difficult to awaken.  You are weak, dizzy, or extremely thirsty. SEEK IMMEDIATE MEDICAL CARE IF:   You are unable to keep fluids down or you get worse despite treatment.  You have frequent episodes of vomiting or diarrhea.  You have blood or green matter (bile) in your vomit.  You have blood in your stool or your stool looks black and tarry.  You have not urinated in 6 to 8 hours, or you have only urinated a small amount of very dark urine.  You have a fever.  You faint. MAKE SURE YOU:   Understand these instructions.  Will watch your condition.  Will get help right away if you are not doing well or get worse. Document Released: 02/21/2005 Document Revised: 05/16/2011 Document Reviewed: 10/11/2010 Exit[employer] Patient Information 2014 [employer].  

## 2012-08-22 NOTE — Progress Notes (Signed)
1225 10 ml Cathflo/blood withdrawn from PAC. PAC has excellent blood return and flushes easily.

## 2012-08-24 ENCOUNTER — Other Ambulatory Visit (HOSPITAL_BASED_OUTPATIENT_CLINIC_OR_DEPARTMENT_OTHER): Payer: Medicare Other | Admitting: Lab

## 2012-08-24 ENCOUNTER — Other Ambulatory Visit: Payer: Self-pay | Admitting: Oncology

## 2012-08-24 ENCOUNTER — Other Ambulatory Visit: Payer: Self-pay | Admitting: *Deleted

## 2012-08-24 ENCOUNTER — Ambulatory Visit (HOSPITAL_BASED_OUTPATIENT_CLINIC_OR_DEPARTMENT_OTHER): Payer: Medicare Other

## 2012-08-24 VITALS — BP 137/78 | HR 67 | Temp 98.1°F

## 2012-08-24 DIAGNOSIS — C569 Malignant neoplasm of unspecified ovary: Secondary | ICD-10-CM

## 2012-08-24 DIAGNOSIS — E860 Dehydration: Secondary | ICD-10-CM

## 2012-08-24 DIAGNOSIS — D649 Anemia, unspecified: Secondary | ICD-10-CM

## 2012-08-24 DIAGNOSIS — D696 Thrombocytopenia, unspecified: Secondary | ICD-10-CM

## 2012-08-24 LAB — CBC WITH DIFFERENTIAL/PLATELET
Basophils Absolute: 0 10*3/uL (ref 0.0–0.1)
EOS%: 0 % (ref 0.0–7.0)
HGB: 10.1 g/dL — ABNORMAL LOW (ref 11.6–15.9)
LYMPH%: 12.6 % — ABNORMAL LOW (ref 14.0–49.7)
MCH: 37 pg — ABNORMAL HIGH (ref 25.1–34.0)
MCV: 106.6 fL — ABNORMAL HIGH (ref 79.5–101.0)
MONO%: 18.1 % — ABNORMAL HIGH (ref 0.0–14.0)
Platelets: 126 10*3/uL — ABNORMAL LOW (ref 145–400)
RDW: 14 % (ref 11.2–14.5)

## 2012-08-24 MED ORDER — SODIUM CHLORIDE 0.9 % IJ SOLN
10.0000 mL | INTRAMUSCULAR | Status: DC | PRN
  Administered 2012-08-24: 10 mL via INTRAVENOUS
  Filled 2012-08-24: qty 10

## 2012-08-24 MED ORDER — SODIUM CHLORIDE 0.9 % IV SOLN
INTRAVENOUS | Status: DC
  Administered 2012-08-24: 13:00:00 via INTRAVENOUS

## 2012-08-24 MED ORDER — HEPARIN SOD (PORK) LOCK FLUSH 100 UNIT/ML IV SOLN
500.0000 [IU] | Freq: Once | INTRAVENOUS | Status: AC
  Administered 2012-08-24: 500 [IU] via INTRAVENOUS
  Filled 2012-08-24: qty 5

## 2012-08-24 NOTE — Patient Instructions (Addendum)
Dehydration, Adult Dehydration is when you lose more fluids from the body than you take in. Vital organs like the kidneys, brain, and heart cannot function without a proper amount of fluids and salt. Any loss of fluids from the body can cause dehydration.  CAUSES   Vomiting.  Diarrhea.  Excessive sweating.  Excessive urine output.  Fever. SYMPTOMS  Mild dehydration  Thirst.  Dry lips.  Slightly dry mouth. Moderate dehydration  Very dry mouth.  Sunken eyes.  Skin does not bounce back quickly when lightly pinched and released.  Dark urine and decreased urine production.  Decreased tear production.  Headache. Severe dehydration  Very dry mouth.  Extreme thirst.  Rapid, weak pulse (more than 100 beats per minute at rest).  Cold hands and feet.  Not able to sweat in spite of heat and temperature.  Rapid breathing.  Blue lips.  Confusion and lethargy.  Difficulty being awakened.  Minimal urine production.  No tears. DIAGNOSIS  Your caregiver will diagnose dehydration based on your symptoms and your exam. Blood and urine tests will help confirm the diagnosis. The diagnostic evaluation should also identify the cause of dehydration. TREATMENT  Treatment of mild or moderate dehydration can often be done at home by increasing the amount of fluids that you drink. It is best to drink small amounts of fluid more often. Drinking too much at one time can make vomiting worse. Refer to the home care instructions below. Severe dehydration needs to be treated at the hospital where you will probably be given intravenous (IV) fluids that contain water and electrolytes. HOME CARE INSTRUCTIONS   Ask your caregiver about specific rehydration instructions.  Drink enough fluids to keep your urine clear or pale yellow.  Drink small amounts frequently if you have nausea and vomiting.  Eat as you normally do.  Avoid:  Foods or drinks high in sugar.  Carbonated  drinks.  Juice.  Extremely hot or cold fluids.  Drinks with caffeine.  Fatty, greasy foods.  Alcohol.  Tobacco.  Overeating.  Gelatin desserts.  Wash your hands well to avoid spreading bacteria and viruses.  Only take over-the-counter or prescription medicines for pain, discomfort, or fever as directed by your caregiver.  Ask your caregiver if you should continue all prescribed and over-the-counter medicines.  Keep all follow-up appointments with your caregiver. SEEK MEDICAL CARE IF:  You have abdominal pain and it increases or stays in one area (localizes).  You have a rash, stiff neck, or severe headache.  You are irritable, sleepy, or difficult to awaken.  You are weak, dizzy, or extremely thirsty. SEEK IMMEDIATE MEDICAL CARE IF:   You are unable to keep fluids down or you get worse despite treatment.  You have frequent episodes of vomiting or diarrhea.  You have blood or green matter (bile) in your vomit.  You have blood in your stool or your stool looks black and tarry.  You have not urinated in 6 to 8 hours, or you have only urinated a small amount of very dark urine.  You have a fever.  You faint. MAKE SURE YOU:   Understand these instructions.  Will watch your condition.  Will get help right away if you are not doing well or get worse. Document Released: 02/21/2005 Document Revised: 05/16/2011 Document Reviewed: 10/11/2010 Exit[employer] Patient Information 2014 [employer].  

## 2012-08-27 ENCOUNTER — Ambulatory Visit (HOSPITAL_BASED_OUTPATIENT_CLINIC_OR_DEPARTMENT_OTHER): Payer: Medicare Other

## 2012-08-27 ENCOUNTER — Other Ambulatory Visit (HOSPITAL_BASED_OUTPATIENT_CLINIC_OR_DEPARTMENT_OTHER): Payer: Medicare Other | Admitting: Lab

## 2012-08-27 VITALS — BP 105/73 | HR 77 | Temp 97.1°F | Resp 18

## 2012-08-27 DIAGNOSIS — D696 Thrombocytopenia, unspecified: Secondary | ICD-10-CM

## 2012-08-27 DIAGNOSIS — C569 Malignant neoplasm of unspecified ovary: Secondary | ICD-10-CM

## 2012-08-27 DIAGNOSIS — E860 Dehydration: Secondary | ICD-10-CM

## 2012-08-27 DIAGNOSIS — D649 Anemia, unspecified: Secondary | ICD-10-CM

## 2012-08-27 LAB — CBC WITH DIFFERENTIAL/PLATELET
Eosinophils Absolute: 0 10*3/uL (ref 0.0–0.5)
HCT: 31.1 % — ABNORMAL LOW (ref 34.8–46.6)
LYMPH%: 24.1 % (ref 14.0–49.7)
MCHC: 35 g/dL (ref 31.5–36.0)
MCV: 107.6 fL — ABNORMAL HIGH (ref 79.5–101.0)
MONO%: 18.6 % — ABNORMAL HIGH (ref 0.0–14.0)
NEUT#: 2.4 10*3/uL (ref 1.5–6.5)
NEUT%: 56.6 % (ref 38.4–76.8)
Platelets: 142 10*3/uL — ABNORMAL LOW (ref 145–400)
RBC: 2.89 10*6/uL — ABNORMAL LOW (ref 3.70–5.45)

## 2012-08-27 MED ORDER — SODIUM CHLORIDE 0.9 % IJ SOLN
10.0000 mL | INTRAMUSCULAR | Status: DC | PRN
  Administered 2012-08-27: 10 mL via INTRAVENOUS
  Filled 2012-08-27: qty 10

## 2012-08-27 MED ORDER — HEPARIN SOD (PORK) LOCK FLUSH 100 UNIT/ML IV SOLN
500.0000 [IU] | Freq: Once | INTRAVENOUS | Status: AC
  Administered 2012-08-27: 500 [IU] via INTRAVENOUS
  Filled 2012-08-27: qty 5

## 2012-08-27 MED ORDER — SODIUM CHLORIDE 0.9 % IV SOLN
INTRAVENOUS | Status: DC
  Administered 2012-08-27: 14:00:00 via INTRAVENOUS

## 2012-09-03 ENCOUNTER — Other Ambulatory Visit (HOSPITAL_BASED_OUTPATIENT_CLINIC_OR_DEPARTMENT_OTHER): Payer: Medicare Other | Admitting: Lab

## 2012-09-03 ENCOUNTER — Other Ambulatory Visit: Payer: Self-pay | Admitting: *Deleted

## 2012-09-03 DIAGNOSIS — D649 Anemia, unspecified: Secondary | ICD-10-CM

## 2012-09-03 DIAGNOSIS — D696 Thrombocytopenia, unspecified: Secondary | ICD-10-CM

## 2012-09-03 DIAGNOSIS — C569 Malignant neoplasm of unspecified ovary: Secondary | ICD-10-CM

## 2012-09-03 LAB — CBC WITH DIFFERENTIAL/PLATELET
BASO%: 0.4 % (ref 0.0–2.0)
EOS%: 0.2 % (ref 0.0–7.0)
HCT: 28.4 % — ABNORMAL LOW (ref 34.8–46.6)
LYMPH%: 13.9 % — ABNORMAL LOW (ref 14.0–49.7)
MCH: 39.8 pg — ABNORMAL HIGH (ref 25.1–34.0)
MCHC: 35.4 g/dL (ref 31.5–36.0)
MONO#: 0.7 10*3/uL (ref 0.1–0.9)
MONO%: 14.8 % — ABNORMAL HIGH (ref 0.0–14.0)
NEUT%: 70.7 % (ref 38.4–76.8)
Platelets: 102 10*3/uL — ABNORMAL LOW (ref 145–400)
RBC: 2.53 10*6/uL — ABNORMAL LOW (ref 3.70–5.45)
WBC: 4.7 10*3/uL (ref 3.9–10.3)

## 2012-09-03 LAB — COMPREHENSIVE METABOLIC PANEL (CC13)
ALT: 13 U/L (ref 0–55)
AST: 20 U/L (ref 5–34)
Alkaline Phosphatase: 92 U/L (ref 40–150)
CO2: 29 mEq/L (ref 22–29)
Creatinine: 1.2 mg/dL — ABNORMAL HIGH (ref 0.6–1.1)
Sodium: 135 mEq/L — ABNORMAL LOW (ref 136–145)
Total Bilirubin: 0.48 mg/dL (ref 0.20–1.20)
Total Protein: 6.5 g/dL (ref 6.4–8.3)

## 2012-09-03 MED ORDER — ACYCLOVIR 400 MG PO TABS: 400.0000 mg | ORAL_TABLET | Freq: Two times a day (BID) | ORAL | Status: DC

## 2012-09-14 ENCOUNTER — Ambulatory Visit (HOSPITAL_COMMUNITY)
Admission: RE | Admit: 2012-09-14 | Discharge: 2012-09-14 | Disposition: A | Discharge status: Home / Self Care | Payer: Medicare Other | Source: Ambulatory Visit | Attending: Oncology | Admitting: Oncology

## 2012-09-14 DIAGNOSIS — Z9079 Acquired absence of other genital organ(s): Secondary | ICD-10-CM | POA: Insufficient documentation

## 2012-09-14 DIAGNOSIS — C569 Malignant neoplasm of unspecified ovary: Secondary | ICD-10-CM

## 2012-09-14 DIAGNOSIS — I700 Atherosclerosis of aorta: Secondary | ICD-10-CM | POA: Insufficient documentation

## 2012-09-14 DIAGNOSIS — K449 Diaphragmatic hernia without obstruction or gangrene: Secondary | ICD-10-CM | POA: Insufficient documentation

## 2012-09-14 DIAGNOSIS — Z90710 Acquired absence of both cervix and uterus: Secondary | ICD-10-CM | POA: Insufficient documentation

## 2012-09-14 DIAGNOSIS — K5730 Diverticulosis of large intestine without perforation or abscess without bleeding: Secondary | ICD-10-CM | POA: Insufficient documentation

## 2012-09-14 DIAGNOSIS — M47814 Spondylosis without myelopathy or radiculopathy, thoracic region: Secondary | ICD-10-CM | POA: Insufficient documentation

## 2012-09-14 DIAGNOSIS — Z9221 Personal history of antineoplastic chemotherapy: Secondary | ICD-10-CM | POA: Insufficient documentation

## 2012-09-14 MED ORDER — IOHEXOL 300 MG/ML  SOLN
100.0000 mL | Freq: Once | INTRAMUSCULAR | Status: AC | PRN
  Administered 2012-09-14: 100 mL via INTRAVENOUS

## 2012-09-17 ENCOUNTER — Ambulatory Visit (HOSPITAL_BASED_OUTPATIENT_CLINIC_OR_DEPARTMENT_OTHER): Payer: Medicare Other | Admitting: Lab

## 2012-09-17 ENCOUNTER — Ambulatory Visit: Payer: Medicare Other | Admitting: Oncology

## 2012-09-17 ENCOUNTER — Other Ambulatory Visit: Payer: Medicare Other | Admitting: Lab

## 2012-09-17 ENCOUNTER — Telehealth: Payer: Self-pay | Admitting: *Deleted

## 2012-09-17 ENCOUNTER — Ambulatory Visit (HOSPITAL_BASED_OUTPATIENT_CLINIC_OR_DEPARTMENT_OTHER): Payer: Medicare Other | Admitting: Oncology

## 2012-09-17 VITALS — BP 118/73 | HR 82 | Temp 98.3°F | Ht 66.0 in | Wt 163.6 lb

## 2012-09-17 DIAGNOSIS — D696 Thrombocytopenia, unspecified: Secondary | ICD-10-CM

## 2012-09-17 DIAGNOSIS — C569 Malignant neoplasm of unspecified ovary: Secondary | ICD-10-CM

## 2012-09-17 DIAGNOSIS — D649 Anemia, unspecified: Secondary | ICD-10-CM

## 2012-09-17 LAB — CBC WITH DIFFERENTIAL/PLATELET
Basophils Absolute: 0 10*3/uL (ref 0.0–0.1)
Eosinophils Absolute: 0 10*3/uL (ref 0.0–0.5)
HCT: 30.2 % — ABNORMAL LOW (ref 34.8–46.6)
HGB: 10.6 g/dL — ABNORMAL LOW (ref 11.6–15.9)
MCH: 40.1 pg — ABNORMAL HIGH (ref 25.1–34.0)
MONO#: 0.7 10*3/uL (ref 0.1–0.9)
NEUT#: 2.1 10*3/uL (ref 1.5–6.5)
NEUT%: 60 % (ref 38.4–76.8)
RDW: 14.2 % (ref 11.2–14.5)
WBC: 3.6 10*3/uL — ABNORMAL LOW (ref 3.9–10.3)
lymph#: 0.7 10*3/uL — ABNORMAL LOW (ref 0.9–3.3)

## 2012-09-17 LAB — COMPREHENSIVE METABOLIC PANEL (CC13)
Albumin: 3.6 g/dL (ref 3.5–5.0)
BUN: 30.6 mg/dL — ABNORMAL HIGH (ref 7.0–26.0)
CO2: 25 mEq/L (ref 22–29)
Calcium: 9.7 mg/dL (ref 8.4–10.4)
Chloride: 101 mEq/L (ref 98–109)
Glucose: 122 mg/dl (ref 70–140)
Potassium: 4.4 mEq/L (ref 3.5–5.1)
Sodium: 137 mEq/L (ref 136–145)
Total Protein: 6.3 g/dL — ABNORMAL LOW (ref 6.4–8.3)

## 2012-09-17 NOTE — Telephone Encounter (Signed)
appts made and printed...td 

## 2012-09-17 NOTE — Progress Notes (Signed)
ID: Regina West   DOB: [birth year]  MR#: [id number]  WUJ#:[id number]  PCP: Minda Meo, MD GYN: SU: Ivar Drape OTHER MD: Porfirio Mylar Dohmeier  HISTORY OF PRESENT ILLNESS: The patient originally presented in the summer of 2010 with cramps and abdominal distension.  I do not have a copy of the initial evaluation, but on November 26, 2008, the patient underwent optimal debulking with bilateral salpingo-oophorectomy, omentectomy, and the placement of an intraperitoneal port.  The pathology from that procedure (Accession Number [id number] at [facility]) showed first of all, significant involvement of the omentum, minimal involvement of the right ovary and right fallopian tube and negative on the left ovary and fallopian tube, with neither ovary being enlarged, consistent with a primary peritoneal serous adenocarcinoma, described as moderately differentiated.  The sample included no lymph nodes.    The patient had an intraperitoneal port placed in the same surgery and was treated with intraperitoneal and IV chemotherapy according to GOG-252 but I am not sure which arm she was in.  (Arm 2 did carboplatin intraperitoneally and Taxol IV.  Arm 3 did cisplatin and paclitaxel intraperitoneally with paclitaxel IV.)  All arms received bevacizumab.  Unfortunately, after 4 cycles of treatment, she had acute mental status changes and was admitted here January of 2011 with what proved to be posterior reversible leukoencephalopathy and SIADH.  She had seizures, aphasia, and required intubation.  Once the patient recovered from this, she was treated with 2 cycles of single-agent carboplatin (I do not have the AUC). Her last adjuvant treatment was May 12, 2009, and her CA-125 at that time was 10.3.  Her intraperitoneal port was removed in April of 2011.    More recently, the patient was in routine follow up when her CA-125 was found to jump up to 2,269.7 (March 26, 2010).  This is 10 months after her last prior chemotherapy.   She had CTs of the chest, abdomen and pelvis January 26th which showed ascites and enhancing peritoneal nodularity. She had had similar findings at presentation but these had completely resolved by the time she finished treatment in March of 2011. The patient was felt to be in first relapse. Subsequent treatments are as summarized below   INTERVAL HISTORY:  Regina West returns today with her daughter and daughter-in-law for followup of Regina West's ovarian cancer. After completing her 6 cycles of carboplatin she went to the beach for 9 days and enjoyed it quite a bit.  REVIEW OF SYSTEMS:  Regina West still "doesn't feel right" but there have been no further falls. She does describe herself as fatigue. She is walking with a cane which she says gives her more authority. There have been no unusual headaches visual changes seizure activity swallowing difficulties cough phlegm production pleurisy shortness of breath or evidence of small bowel obstruction. She is having regular bowel movements daily. She did has good bladder control.  PAST MEDICAL HISTORY: Significant for hysterectomy at the age of 26 with concurrent bladder "tuck up."  She also underwent appendectomy during that procedure.  She is status post prior bilateral cataract surgery. She is status post tonsillectomy and adenoidectomy.  She has a history of hypertension and diabetes. She has a history of diverticulosis.  She has a large hiatal hernia.  She has degenerative disk disease.  She has been noted to have coronary calcifications and she has hyperlipidemia.  There is a history of remote tobacco abuse. Seizure disorder as per HPI  FAMILY HISTORY The patient's father died at the age of 68 after  heart surgery for aortic stenosis.  The patient's mother died at the age of 17.  The patient has one brother in good health.  There is no other family history to her knowledge of ovarian or breast cancer.    GYNECOLOGIC HISTORY: Menarche at age 77.  She is GX P3  with menopause in her late 44s.  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 She is currently residing in [place]. Her husband was in sales and had a history of Parkinson's disease.  He died following a fall in the year 2000.  The patient's son Regina West lives in [place], and has a history of MS.  Daughter Regina West lives in [place]. She is a Futures trader.  Son Regina West lives in [place], [place] and is in Airline pilot. The patient has 8 grandchildren. She attends the [employer].  She is a good friend of our patients [name]   ADVANCED DIRECTIVES: in place  HEALTH MAINTENANCE: History  Substance Use Topics  . Smoking status: Former Smoker    Quit date: 05/24/1959  . Smokeless tobacco: Never Used  . Alcohol Use: No     Colonoscopy: 2008  PAP: UTD  MM: refuses  Allergies  Allergen Reactions  . Avastin (Bevacizumab) Other (See Comments)    Swelling of the brain     Current Outpatient Prescriptions  Medication Sig Dispense Refill  . acyclovir (ZOVIRAX) 400 MG tablet Take 1 tablet (400 mg total) by mouth 2 (two) times daily.  60 tablet  3  . antiseptic oral rinse (BIOTENE) LIQD 15 mLs by Mouth Rinse route 2 (two) times daily.  1 Bottle  0  . cholecalciferol (VITAMIN D) 1000 UNITS tablet Take 1,000 Units by mouth 2 (two) times daily.      Marland Kitchen dexamethasone (DECADRON) 4 MG tablet Take 2 tablets (8 mg total) by mouth 2 (two) times daily with a meal. Take two times a day starting the day after chemotherapy for 3 days.  30 tablet  1  . doxycycline (VIBRAMYCIN) 100 MG capsule Take 100 mg by mouth 2 (two) times daily.      . LevETIRAcetam (KEPPRA PO) Take 1 tablet by mouth 2 (two) times daily.      . nitrofurantoin, macrocrystal-monohydrate, (MACROBID) 100 MG capsule Take 1 tablet bid x 3 days then 1 tablet daily indifently  30 capsule  6  . omeprazole (PRILOSEC) 40 MG  capsule Take 1 capsule (40 mg total) by mouth at bedtime.  30 capsule  4  . VIIBRYD 40 MG TABS        No current facility-administered medications for this visit.    OBJECTIVE: Elderly white woman in no acute distress Filed Vitals:   09/17/12 0941  BP: 118/73  Pulse: 82  Temp: 98.3 F (36.8 C)     Body mass index is 26.42 kg/(m^2).    ECOG FS: 1 Filed Weights   09/17/12 0941  Weight: 163 lb 9.6 oz (74.208 kg)   Sclerae unicteric Oropharynx clear No cervical, supraclavicular, or axillary adenopathy Lungs clear to auscultation bilaterally, no wheezes, no rales or rhonchi Heart regular rate and rhythm Abdomen soft, minimally distended, nontender to palpation, positive bowel sounds, no masses palpated. MSK no focal spinal tenderness, no peripheral edema Neuro: nonfocal, pleasant affect, well oriented Breasts: Deferred    LAB RESULTS: Results for Regina West, Regina West (MRN [id number]) as of  09/17/2012 09:51  Ref. Range 05/21/2012 08:13 05/28/2012 10:34 06/11/2012 08:27 07/31/2012 13:45 08/20/2012 11:24  CA 125 Latest Range: 0.0-30.2 U/mL 91.1 (H) 67.3 (H) 36.1 (H) 12.9 11.2    Lab Results  Component Value Date   CA125 11.2 08/20/2012   Lab Results  Component Value Date   WBC 4.7 09/03/2012   NEUTROABS 3.3 09/03/2012   HGB 10.1* 09/03/2012   HCT 28.4* 09/03/2012   MCV 112.2* 09/03/2012   PLT 102* 09/03/2012      Chemistry      Component Value Date/Time   NA 135* 09/03/2012 1612   NA 136 10/17/2011 1118   NA 143 05/12/2011 1337   K 4.3 09/03/2012 1612   K 3.9 10/17/2011 1118   K 4.1 05/12/2011 1337   CL 100 08/20/2012 1124   CL 100 10/17/2011 1118   CL 92* 05/12/2011 1337   CO2 29 09/03/2012 1612   CO2 27 10/17/2011 1118   CO2 32 05/12/2011 1337   BUN 24.0 09/03/2012 1612   BUN 25* 10/17/2011 1118   BUN 19 05/12/2011 1337   CREATININE 1.2* 09/03/2012 1612   CREATININE 1.10 10/17/2011 1118   CREATININE 1.3* 05/12/2011 1337      Component Value Date/Time   CALCIUM 9.8 09/03/2012 1612   CALCIUM  9.8 10/17/2011 1118   CALCIUM 9.8 05/12/2011 1337   ALKPHOS 92 09/03/2012 1612   ALKPHOS 109 10/17/2011 1118   ALKPHOS 95* 05/12/2011 1337   AST 20 09/03/2012 1612   AST 22 10/17/2011 1118   AST 40* 05/12/2011 1337   ALT 13 09/03/2012 1612   ALT 14 10/17/2011 1118   BILITOT 0.48 09/03/2012 1612   BILITOT 0.4 10/17/2011 1118   BILITOT 0.60 05/12/2011 1337      STUDIES: Dg Chest 2 View  09/14/2012   *RADIOLOGY REPORT*  Clinical Data: Shortness of breath.  Ovarian cancer.  Ex-smoker.  CHEST - 2 VIEW  Comparison: 04/13/2012  Findings: Lower thoracic spondylosis.  A right-sided Port-A-Cath terminates at the mid SVC.  Midline trachea.  Normal heart size with atherosclerosis in the transverse aorta.  A large hiatal hernia with air-fluid level of the herniated stomach.  No pleural effusion or pneumothorax.  Mild increased density at the left lung base is similar and favored to represent an area of scarring.  IMPRESSION: No acute cardiopulmonary disease.  Large hiatal hernia.  Probable scarring at the left lung base.  An abdominal CT is scheduled and will evaluate this area.   Original Report Authenticated By: Jeronimo Greaves, M.D.   Ct Abdomen Pelvis W Contrast  09/14/2012   *RADIOLOGY REPORT*  Clinical Data: Ovarian cancer diagnosed 2010 with recurrence, status post TAH-BSO, chemotherapy completed 2 weeks ago  CT ABDOMEN AND PELVIS WITH CONTRAST  Technique:  Multidetector CT imaging of the abdomen and pelvis was performed following the standard protocol during bolus administration of intravenous contrast.  Contrast: OMNIPAQUE IOHEXOL 300 MG/ML  SOLN  Comparison: 04/13/2012  Findings: Lung bases are essentially clear.  Large hiatal hernia.  Liver, spleen, pancreas, and adrenal glands are within normal limits.  Gallbladder is unremarkable.  No intrahepatic or extrahepatic ductal dilatation.  Kidneys are within normal limits.  No hydronephrosis.  No evidence of bowel obstruction.  Extensive sigmoid diverticulosis,  without associated inflammatory changes.  Atherosclerotic calcifications of the abdominal aorta and branch vessels.  No abdominopelvic ascites.  Mild mesenteric stranding/soft tissue in the right mid abdomen (series 2/images 47 and 56), improved.  No gross peritoneal/omental nodularity on the  current study.  No suspicious abdominopelvic lymphadenopathy.  Status post hysterectomy and bilateral salpingo-oophorectomy.  Bladder is mildly thick-walled although underdistended.  Tiny focus of gas within the nondependent bladder (series 2/image 69).  Degenerative changes of the visualized thoracolumbar spine.  IMPRESSION: No gross peritoneal/omental nodularity on the current study.  No abdominopelvic ascites.  Mild mesenteric stranding/soft tissue in the right mid abdomen, improved.  Otherwise, no evidence of recurrent or metastatic disease in the abdomen/pelvis.   Original Report Authenticated By: Charline Bills, M.D.     ASSESSMENT: 79 y.o.  [place] woman   (1) status post optimal debulking of a primary peritoneal serous adenocarcinoma September 2010, the tumor being moderately differentiated, pT3c NX (stage IIIC), treated according to GOG 252 with  intraperitoneal platinum and paclitaxel x4 given along with bevacizumab, complicated by SIADHafter cycle 4, also with posterior reversible leukoencephalopathy.  After these problems resolved she received 2 additional cycles of single-agent carboplatin completed in March 2011  (2)  She had her first recurrence February 2012  treated with carboplatin and Gemzar for a total of 6 cycles between February and June 2012.  Her CEA-125 had normalized before the beginning of cycle 5.   (3) second recurrence documented February 2013,  treated with carboplatin/ doxil x 6, completed 10/03/2011, with a good response noted on restaging scans  (4) third recurrence documented February 2014, receiving single-agent carboplatin every 3 weeks, first dose 04/30/2012, completed  08/20/2012  PLAN:  Atoya is back in remission and the plan is going to be to continue her labwork on a monthly basis until the next relapse. She will have lab work here in July 28 and then again September 12 when she sees Dr. Nedra Hai at [facility]. Work will be October 27 and then she will see me October 20.   I have strongly encouraged her to walk on a regular basis and to be as active as she possibly can. We will do a port flushed with each one of her lab draws. She knows to call us for any problems that may develop before the next visit here.   MAGRINAT,GUSTAV C    09/17/2012

## 2012-10-01 ENCOUNTER — Ambulatory Visit (HOSPITAL_BASED_OUTPATIENT_CLINIC_OR_DEPARTMENT_OTHER): Payer: Medicare Other

## 2012-10-01 ENCOUNTER — Other Ambulatory Visit (HOSPITAL_BASED_OUTPATIENT_CLINIC_OR_DEPARTMENT_OTHER): Payer: Medicare Other

## 2012-10-01 VITALS — BP 119/74 | HR 73 | Temp 97.9°F | Resp 20

## 2012-10-01 DIAGNOSIS — C569 Malignant neoplasm of unspecified ovary: Secondary | ICD-10-CM

## 2012-10-01 DIAGNOSIS — Z452 Encounter for adjustment and management of vascular access device: Secondary | ICD-10-CM

## 2012-10-01 DIAGNOSIS — N83209 Unspecified ovarian cyst, unspecified side: Secondary | ICD-10-CM

## 2012-10-01 LAB — CBC WITH DIFFERENTIAL/PLATELET
Basophils Absolute: 0 10*3/uL (ref 0.0–0.1)
EOS%: 1.5 % (ref 0.0–7.0)
Eosinophils Absolute: 0.1 10*3/uL (ref 0.0–0.5)
HGB: 11.2 g/dL — ABNORMAL LOW (ref 11.6–15.9)
MCH: 38.5 pg — ABNORMAL HIGH (ref 25.1–34.0)
MCV: 111.3 fL — ABNORMAL HIGH (ref 79.5–101.0)
MONO%: 13.1 % (ref 0.0–14.0)
NEUT#: 2.7 10*3/uL (ref 1.5–6.5)
RBC: 2.91 10*6/uL — ABNORMAL LOW (ref 3.70–5.45)
RDW: 13.5 % (ref 11.2–14.5)
lymph#: 0.7 10*3/uL — ABNORMAL LOW (ref 0.9–3.3)

## 2012-10-01 LAB — COMPREHENSIVE METABOLIC PANEL (CC13)
ALT: 12 U/L (ref 0–55)
Albumin: 3.6 g/dL (ref 3.5–5.0)
Alkaline Phosphatase: 100 U/L (ref 40–150)
Glucose: 151 mg/dl — ABNORMAL HIGH (ref 70–140)
Potassium: 4.3 mEq/L (ref 3.5–5.1)
Sodium: 141 mEq/L (ref 136–145)
Total Bilirubin: 0.41 mg/dL (ref 0.20–1.20)
Total Protein: 6.5 g/dL (ref 6.4–8.3)

## 2012-10-01 LAB — CA 125: CA 125: 12 U/mL (ref 0.0–30.2)

## 2012-10-01 MED ORDER — SODIUM CHLORIDE 0.9 % IJ SOLN
10.0000 mL | INTRAMUSCULAR | Status: DC | PRN
  Administered 2012-10-01: 10 mL via INTRAVENOUS
  Filled 2012-10-01: qty 10

## 2012-10-01 MED ORDER — HEPARIN SOD (PORK) LOCK FLUSH 100 UNIT/ML IV SOLN
500.0000 [IU] | Freq: Once | INTRAVENOUS | Status: AC
  Administered 2012-10-01: 500 [IU] via INTRAVENOUS
  Filled 2012-10-01: qty 5

## 2012-10-01 NOTE — Patient Instructions (Addendum)

## 2012-10-02 ENCOUNTER — Other Ambulatory Visit: Payer: Medicare Other | Admitting: Lab

## 2012-10-08 ENCOUNTER — Telehealth: Payer: Self-pay | Admitting: Oncology

## 2012-10-08 NOTE — Telephone Encounter (Signed)
lesa (friend/transport) called today and r/s 10/17 lb/flush to 10/24 and 10/20 GM to 11/10. Lesa has both new d/t.

## 2012-10-15 ENCOUNTER — Other Ambulatory Visit: Payer: Self-pay | Admitting: *Deleted

## 2012-10-15 DIAGNOSIS — C569 Malignant neoplasm of unspecified ovary: Secondary | ICD-10-CM

## 2012-10-15 MED ORDER — NITROFURANTOIN MACROCRYSTAL 50 MG PO CAPS: ORAL_CAPSULE | ORAL | Status: DC

## 2012-11-19 ENCOUNTER — Other Ambulatory Visit (HOSPITAL_BASED_OUTPATIENT_CLINIC_OR_DEPARTMENT_OTHER): Payer: Medicare Other

## 2012-11-19 ENCOUNTER — Ambulatory Visit (HOSPITAL_BASED_OUTPATIENT_CLINIC_OR_DEPARTMENT_OTHER): Payer: Medicare Other

## 2012-11-19 VITALS — BP 118/91 | HR 74 | Temp 98.2°F | Resp 18

## 2012-11-19 DIAGNOSIS — C569 Malignant neoplasm of unspecified ovary: Secondary | ICD-10-CM

## 2012-11-19 LAB — CBC WITH DIFFERENTIAL/PLATELET
BASO%: 0.5 % (ref 0.0–2.0)
EOS%: 3 % (ref 0.0–7.0)
LYMPH%: 18.1 % (ref 14.0–49.7)
MCH: 36.6 pg — ABNORMAL HIGH (ref 25.1–34.0)
MCHC: 34.6 g/dL (ref 31.5–36.0)
MCV: 105.8 fL — ABNORMAL HIGH (ref 79.5–101.0)
MONO%: 13.9 % (ref 0.0–14.0)
Platelets: 135 10*3/uL — ABNORMAL LOW (ref 145–400)
RBC: 3.23 10*6/uL — ABNORMAL LOW (ref 3.70–5.45)
RDW: 12.4 % (ref 11.2–14.5)

## 2012-11-19 LAB — COMPREHENSIVE METABOLIC PANEL (CC13)
ALT: 14 U/L (ref 0–55)
AST: 20 U/L (ref 5–34)
Alkaline Phosphatase: 98 U/L (ref 40–150)
CO2: 28 mEq/L (ref 22–29)
Creatinine: 1.3 mg/dL — ABNORMAL HIGH (ref 0.6–1.1)
Sodium: 141 mEq/L (ref 136–145)
Total Bilirubin: 0.43 mg/dL (ref 0.20–1.20)

## 2012-11-19 MED ORDER — HEPARIN SOD (PORK) LOCK FLUSH 100 UNIT/ML IV SOLN
500.0000 [IU] | Freq: Once | INTRAVENOUS | Status: AC
  Administered 2012-11-19: 500 [IU] via INTRAVENOUS
  Filled 2012-11-19: qty 5

## 2012-11-19 MED ORDER — SODIUM CHLORIDE 0.9 % IJ SOLN
10.0000 mL | INTRAMUSCULAR | Status: DC | PRN
  Administered 2012-11-19: 10 mL via INTRAVENOUS
  Filled 2012-11-19: qty 10

## 2012-11-19 NOTE — Patient Instructions (Addendum)
Implanted Port Instructions  An implanted port is a central line that has a round shape and is placed under the skin. It is used for long-term IV (intravenous) access for:  · Medicine.  · Fluids.  · Liquid nutrition, such as TPN (total parenteral nutrition).  · Blood samples.  Ports can be placed:  · In the chest area just below the collarbone (this is the most common place.)  · In the arms.  · In the belly (abdomen) area.  · In the legs.  PARTS OF THE PORT  A port has 2 main parts:  · The reservoir. The reservoir is round, disc-shaped, and will be a small, raised area under your skin.  · The reservoir is the part where a needle is inserted (accessed) to either give medicines or to draw blood.  · The catheter. The catheter is a long, slender tube that extends from the reservoir. The catheter is placed into a large vein.  · Medicine that is inserted into the reservoir goes into the catheter and then into the vein.  INSERTION OF THE PORT  · The port is surgically placed in either an operating room or in a procedural area (interventional radiology).  · Medicine may be given to help you relax during the procedure.  · The skin where the port will be inserted is numbed (local anesthetic).  · 1 or 2 small cuts (incisions) will be made in the skin to insert the port.  · The port can be used after it has been inserted.  INCISION SITE CARE  · The incision site may have small adhesive strips on it. This helps keep the incision site closed. Sometimes, no adhesive strips are placed. Instead of adhesive strips, a special kind of surgical glue is used to keep the incision closed.  · If adhesive strips were placed on the incision sites, do not take them off. They will fall off on their own.  · The incision site may be sore for 1 to 2 days. Pain medicine can help.  · Do not get the incision site wet. Bathe or shower as directed by your caregiver.  · The incision site should heal in 5 to 7 days. A small scar may form after the  incision has healed.  ACCESSING THE PORT  Special steps must be taken to access the port:  · Before the port is accessed, a numbing cream can be placed on the skin. This helps numb the skin over the port site.  · A sterile technique is used to access the port.  · The port is accessed with a needle. Only "non-coring" port needles should be used to access the port. Once the port is accessed, a blood return should be checked. This helps ensure the port is in the vein and is not clogged (clotted).  · If your caregiver believes your port should remain accessed, a clear (transparent) bandage will be placed over the needle site. The bandage and needle will need to be changed every week or as directed by your caregiver.  · Keep the bandage covering the needle clean and dry. Do not get it wet. Follow your caregiver's instructions on how to take a shower or bath when the port is accessed.  · If your port does not need to stay accessed, no bandage is needed over the port.  FLUSHING THE PORT  Flushing the port keeps it from getting clogged. How often the port is flushed depends on:  · If a   constant infusion is running. If a constant infusion is running, the port may not need to be flushed.  · If intermittent medicines are given.  · If the port is not being used.  For intermittent medicines:  · The port will need to be flushed:  · After medicines have been given.  · After blood has been drawn.  · As part of routine maintenance.  · A port is normally flushed with:  · Normal saline.  · Heparin.  · Follow your caregiver's advice on how often, how much, and the type of flush to use on your port.  IMPORTANT PORT INFORMATION  · Tell your caregiver if you are allergic to heparin.  · After your port is placed, you will get a manufacturer's information card. The card has information about your port. Keep this card with you at all times.  · There are many types of ports available. Know what kind of port you have.  · In case of an  emergency, it may be helpful to wear a medical alert bracelet. This can help alert health care workers that you have a port.  · The port can stay in for as long as your caregiver believes it is necessary.  · When it is time for the port to come out, surgery will be done to remove it. The surgery will be similar to how the port was put in.  · If you are in the hospital or clinic:  · Your port will be taken care of and flushed by a nurse.  · If you are at home:  · A home health care nurse may give medicines and take care of the port.  · You or a family member can get special training and directions for giving medicine and taking care of the port at home.  SEEK IMMEDIATE MEDICAL CARE IF:   · Your port does not flush or you are unable to get a blood return.  · New drainage or pus is coming from the incision.  · A bad smell is coming from the incision site.  · You develop swelling or increased redness at the incision site.  · You develop increased swelling or pain at the port site.  · You develop swelling or pain in the surrounding skin near the port.  · You have an oral temperature above 102° F (38.9° C), not controlled by medicine.  MAKE SURE YOU:   · Understand these instructions.  · Will watch your condition.  · Will get help right away if you are not doing well or get worse.  Document Released: 02/21/2005 Document Revised: 05/16/2011 Document Reviewed: 05/15/2008  Exit[employer]® Patient Information ©2014 [employer].

## 2012-11-20 LAB — CA 125: CA 125: 18.7 U/mL (ref 0.0–30.2)

## 2012-11-28 IMAGING — CR DG ABDOMEN 2V
3 series · 3 of 3 positions shown · non-contrast
Comparison: [DATE] and 06/02/2011 and a CT scan dated 05/23/2011

CLINICAL DATA: Partial small bowel obstruction.  Ovarian cancer.

ABDOMEN - 2 VIEW

[w abdomen upright]
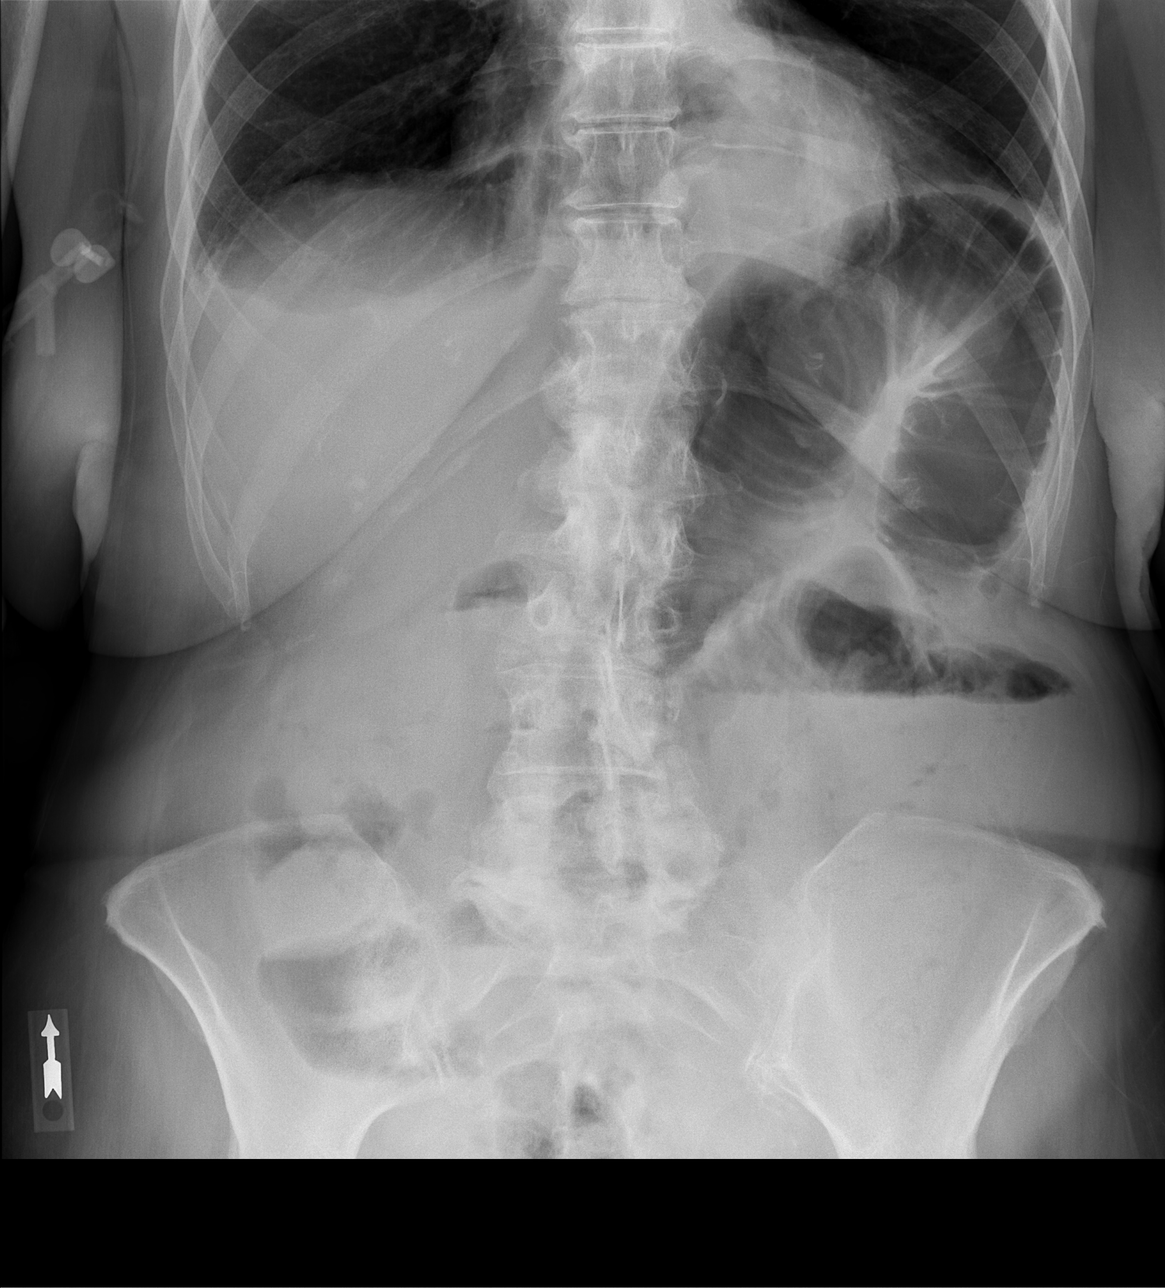

[t abdomen supine (1 of 2)]
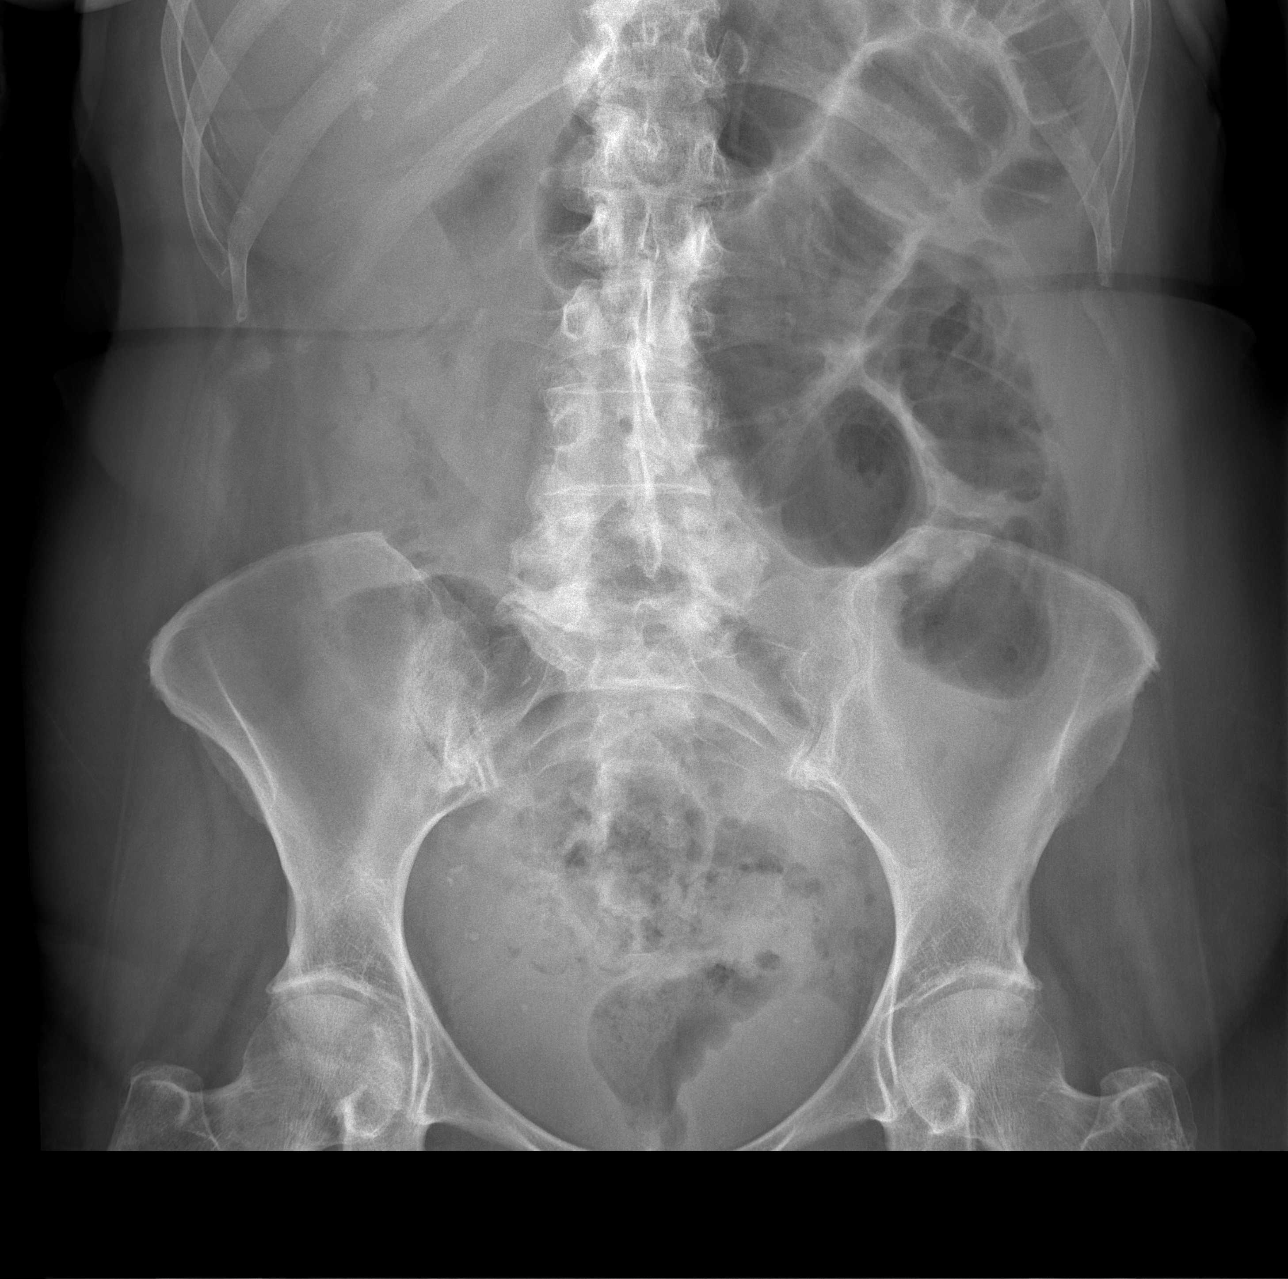

[t abdomen supine (2 of 2)]
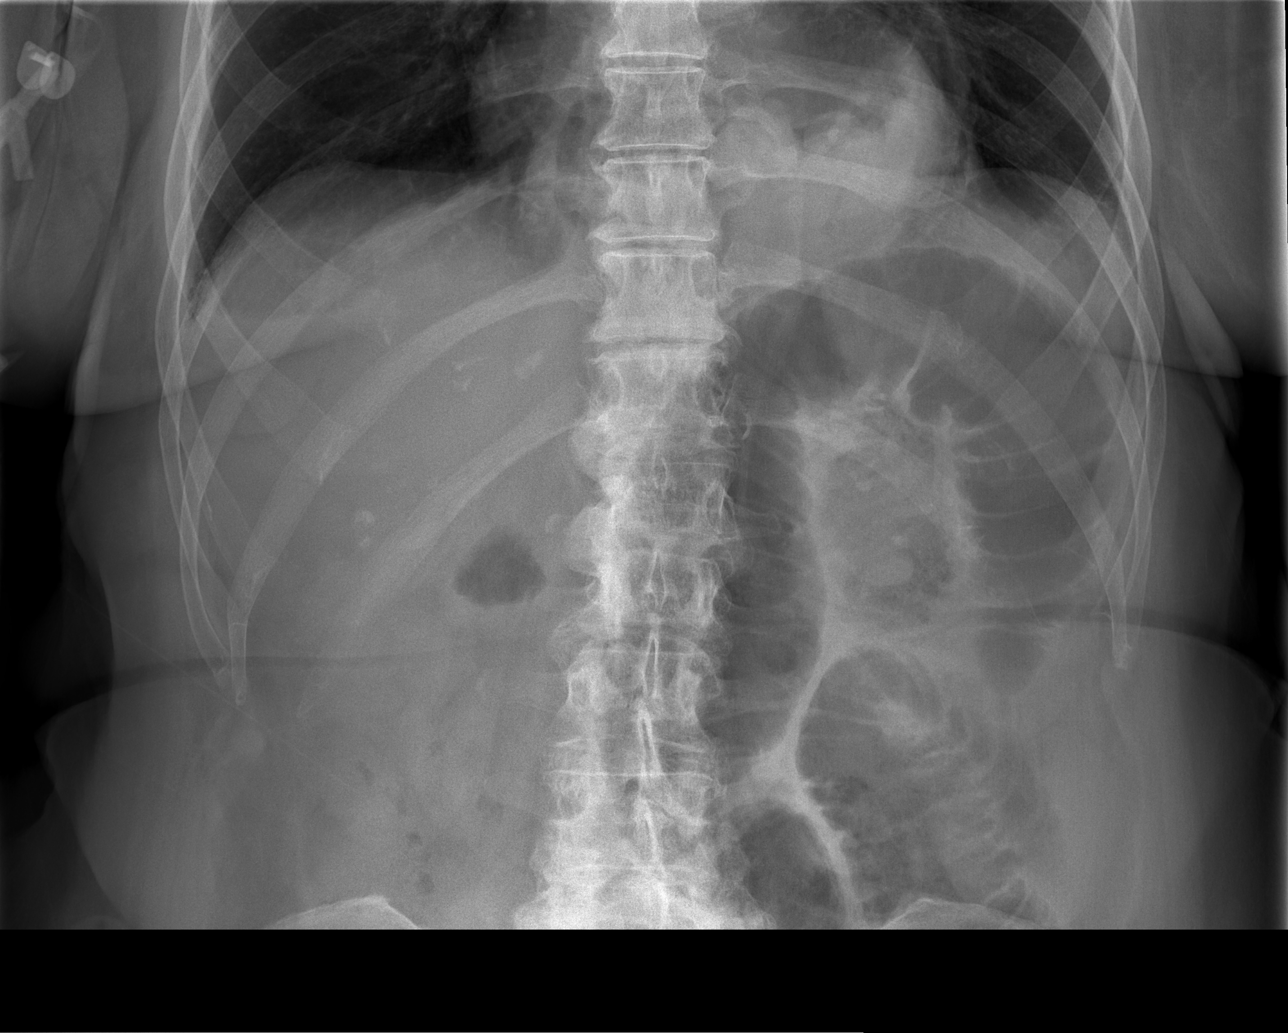

[3 of 3 positions shown; findings below may reference images not displayed]

FINDINGS: There is persistent dilatation of multiple small bowel
loops in the left mid abdomen.  Distal small bowel and colon are
not dilated.  Large hiatal hernia is noted.

No free air.  Small right pleural effusion.
IMPRESSION: Persistent small bowel obstruction.  No significant change since
the prior study.

## 2012-12-17 ENCOUNTER — Telehealth: Payer: Self-pay | Admitting: *Deleted

## 2012-12-17 ENCOUNTER — Other Ambulatory Visit: Payer: Self-pay | Admitting: Neurology

## 2012-12-17 NOTE — Telephone Encounter (Signed)
I have moved appts from 10/24 to 10/27 per daughters request. Daughter given new appt time/date

## 2012-12-18 ENCOUNTER — Other Ambulatory Visit: Payer: Self-pay | Admitting: *Deleted

## 2012-12-18 DIAGNOSIS — C569 Malignant neoplasm of unspecified ovary: Secondary | ICD-10-CM

## 2012-12-18 MED ORDER — DOXYCYCLINE HYCLATE 100 MG PO CAPS: 100.0000 mg | ORAL_CAPSULE | Freq: Every day | ORAL | Status: DC

## 2012-12-21 ENCOUNTER — Other Ambulatory Visit: Payer: Medicare Other | Admitting: Lab

## 2012-12-24 ENCOUNTER — Other Ambulatory Visit: Payer: Medicare Other | Admitting: Lab

## 2012-12-24 ENCOUNTER — Ambulatory Visit: Payer: Medicare Other | Admitting: Oncology

## 2012-12-25 ENCOUNTER — Other Ambulatory Visit: Payer: Medicare Other | Admitting: Lab

## 2012-12-28 ENCOUNTER — Other Ambulatory Visit: Payer: Medicare Other | Admitting: Lab

## 2012-12-31 ENCOUNTER — Telehealth: Payer: Self-pay | Admitting: *Deleted

## 2012-12-31 NOTE — Telephone Encounter (Signed)
Leasa called and left a message to speak with patient regarding painful evacuation and low urine output. Called and spoke with patient, she was bery excited she has had such an eventful weekend and was very busy! She explains that today she feels she does not have the flow of urine as usual, however, she denies having any burning, abnormal color or odor of urine.  She continues daily Macrodantin. She states she does not feel she drank as much as she should due to her busy weekend and could be constipated as well. Patient has no problems eating and drinking and will force fluids this evening as well as take a couple Senekot for constipation. Patient will call us back in the morning with an update and then we can make a decision if further eval and treatment is necessary. Patient understands to call us or seek further medical eval if severe pain or problems worsen.

## 2013-01-01 ENCOUNTER — Telehealth: Payer: Self-pay | Admitting: *Deleted

## 2013-01-01 NOTE — Telephone Encounter (Signed)
This RN spoke with pt today per follow up due to concern for constipation.  Regina West states she did have a BM- " but I don't think I am concerned about constipation but that I am having a pain on my left side "  Per discussion pain does not seem to be associated with BM or constipation- just present. Regina West stated " but then I know I get concerned because I do not want things to recur ".  Plan at present is pt will work to keep stools regular and soft. She is scheduled for lab next week and tumor marker will be checked.

## 2013-01-07 ENCOUNTER — Other Ambulatory Visit (HOSPITAL_BASED_OUTPATIENT_CLINIC_OR_DEPARTMENT_OTHER): Payer: Medicare Other | Admitting: Lab

## 2013-01-07 ENCOUNTER — Encounter (INDEPENDENT_AMBULATORY_CARE_PROVIDER_SITE_OTHER): Payer: Self-pay

## 2013-01-07 ENCOUNTER — Ambulatory Visit (HOSPITAL_BASED_OUTPATIENT_CLINIC_OR_DEPARTMENT_OTHER): Payer: Medicare Other

## 2013-01-07 DIAGNOSIS — Z95828 Presence of other vascular implants and grafts: Secondary | ICD-10-CM

## 2013-01-07 DIAGNOSIS — C569 Malignant neoplasm of unspecified ovary: Secondary | ICD-10-CM

## 2013-01-07 DIAGNOSIS — Z452 Encounter for adjustment and management of vascular access device: Secondary | ICD-10-CM

## 2013-01-07 LAB — COMPREHENSIVE METABOLIC PANEL (CC13)
ALT: 13 U/L (ref 0–55)
Anion Gap: 10 mEq/L (ref 3–11)
CO2: 26 mEq/L (ref 22–29)
Calcium: 10.4 mg/dL (ref 8.4–10.4)
Chloride: 104 mEq/L (ref 98–109)
Sodium: 140 mEq/L (ref 136–145)
Total Protein: 6.5 g/dL (ref 6.4–8.3)

## 2013-01-07 LAB — CBC & DIFF AND RETIC
BASO%: 0.2 % (ref 0.0–2.0)
Eosinophils Absolute: 0.1 10*3/uL (ref 0.0–0.5)
HCT: 35.1 % (ref 34.8–46.6)
LYMPH%: 18.5 % (ref 14.0–49.7)
MCHC: 33 g/dL (ref 31.5–36.0)
MCV: 100.6 fL (ref 79.5–101.0)
MONO#: 0.6 10*3/uL (ref 0.1–0.9)
MONO%: 11.2 % (ref 0.0–14.0)
NEUT%: 67.9 % (ref 38.4–76.8)
Platelets: 170 10*3/uL (ref 145–400)
WBC: 4.9 10*3/uL (ref 3.9–10.3)

## 2013-01-07 MED ORDER — HEPARIN SOD (PORK) LOCK FLUSH 100 UNIT/ML IV SOLN
500.0000 [IU] | Freq: Once | INTRAVENOUS | Status: AC
  Administered 2013-01-07: 500 [IU] via INTRAVENOUS
  Filled 2013-01-07: qty 5

## 2013-01-07 MED ORDER — SODIUM CHLORIDE 0.9 % IJ SOLN
10.0000 mL | INTRAMUSCULAR | Status: DC | PRN
  Administered 2013-01-07: 10 mL via INTRAVENOUS
  Filled 2013-01-07: qty 10

## 2013-01-08 LAB — CA 125: CA 125: 245 U/mL — ABNORMAL HIGH (ref 0.0–30.2)

## 2013-01-09 ENCOUNTER — Other Ambulatory Visit: Payer: Self-pay | Admitting: Oncology

## 2013-01-09 DIAGNOSIS — C569 Malignant neoplasm of unspecified ovary: Secondary | ICD-10-CM

## 2013-01-10 ENCOUNTER — Other Ambulatory Visit: Payer: Self-pay | Admitting: Oncology

## 2013-01-10 NOTE — Telephone Encounter (Signed)
Lm gv appt for 01/11/13 @ 11:30am....td

## 2013-01-11 ENCOUNTER — Other Ambulatory Visit (HOSPITAL_BASED_OUTPATIENT_CLINIC_OR_DEPARTMENT_OTHER): Payer: Medicare Other

## 2013-01-11 ENCOUNTER — Encounter (INDEPENDENT_AMBULATORY_CARE_PROVIDER_SITE_OTHER): Payer: Self-pay

## 2013-01-11 DIAGNOSIS — C569 Malignant neoplasm of unspecified ovary: Secondary | ICD-10-CM

## 2013-01-11 LAB — COMPREHENSIVE METABOLIC PANEL (CC13)
AST: 18 U/L (ref 5–34)
Albumin: 3.6 g/dL (ref 3.5–5.0)
Alkaline Phosphatase: 129 U/L (ref 40–150)
BUN: 24.2 mg/dL (ref 7.0–26.0)
Potassium: 4.3 mEq/L (ref 3.5–5.1)
Sodium: 141 mEq/L (ref 136–145)
Total Protein: 7 g/dL (ref 6.4–8.3)

## 2013-01-11 LAB — CBC WITH DIFFERENTIAL/PLATELET
BASO%: 0.5 % (ref 0.0–2.0)
EOS%: 1.9 % (ref 0.0–7.0)
MCH: 33.7 pg (ref 25.1–34.0)
MCHC: 33.4 g/dL (ref 31.5–36.0)
RBC: 3.65 10*6/uL — ABNORMAL LOW (ref 3.70–5.45)
RDW: 12.5 % (ref 11.2–14.5)
lymph#: 0.6 10*3/uL — ABNORMAL LOW (ref 0.9–3.3)

## 2013-01-12 LAB — CA 125: CA 125: 271 U/mL — ABNORMAL HIGH (ref 0.0–30.2)

## 2013-01-14 ENCOUNTER — Telehealth: Payer: Self-pay | Admitting: Oncology

## 2013-01-14 ENCOUNTER — Telehealth: Payer: Self-pay | Admitting: *Deleted

## 2013-01-14 ENCOUNTER — Ambulatory Visit (HOSPITAL_BASED_OUTPATIENT_CLINIC_OR_DEPARTMENT_OTHER): Payer: Medicare Other | Admitting: Oncology

## 2013-01-14 ENCOUNTER — Encounter (INDEPENDENT_AMBULATORY_CARE_PROVIDER_SITE_OTHER): Payer: Self-pay

## 2013-01-14 VITALS — BP 137/84 | HR 86 | Temp 97.8°F | Resp 20 | Ht 66.0 in | Wt 166.1 lb

## 2013-01-14 DIAGNOSIS — R141 Gas pain: Secondary | ICD-10-CM

## 2013-01-14 DIAGNOSIS — C482 Malignant neoplasm of peritoneum, unspecified: Secondary | ICD-10-CM | POA: Insufficient documentation

## 2013-01-14 DIAGNOSIS — C569 Malignant neoplasm of unspecified ovary: Secondary | ICD-10-CM

## 2013-01-14 MED ORDER — ONDANSETRON HCL 8 MG PO TABS: 8.0000 mg | ORAL_TABLET | Freq: Two times a day (BID) | ORAL | Status: DC

## 2013-01-14 MED ORDER — PROCHLORPERAZINE MALEATE 10 MG PO TABS: 10.0000 mg | ORAL_TABLET | Freq: Four times a day (QID) | ORAL | Status: DC | PRN

## 2013-01-14 NOTE — Telephone Encounter (Signed)
, °

## 2013-01-14 NOTE — Telephone Encounter (Signed)
Per staff message and POF I have scheduled appts.  JMW  

## 2013-01-14 NOTE — Progress Notes (Signed)
ID: Regina West   DOB: [birth year]  MR#: [id number]  WUJ#:[id number]  PCP: Minda Meo, MD GYN: SU: Ivar Drape OTHER MD: Porfirio Mylar Dohmeier  HISTORY OF PRESENT ILLNESS: The patient originally presented in the summer of 2010 with cramps and abdominal distension.  I do not have a copy of the initial evaluation, but on November 26, 2008, the patient underwent optimal debulking with bilateral salpingo-oophorectomy, omentectomy, and the placement of an intraperitoneal port.  The pathology from that procedure (Accession Number [id number] at [facility]) showed first of all, significant involvement of the omentum, minimal involvement of the right ovary and right fallopian tube and negative on the left ovary and fallopian tube, with neither ovary being enlarged, consistent with a primary peritoneal serous adenocarcinoma, described as moderately differentiated.  The sample included no lymph nodes.    The patient had an intraperitoneal port placed in the same surgery and was treated with intraperitoneal and IV chemotherapy according to GOG-252 but I am not sure which arm she was in.  (Arm 2 did carboplatin intraperitoneally and Taxol IV.  Arm 3 did cisplatin and paclitaxel intraperitoneally with paclitaxel IV.)  All arms received bevacizumab.  Unfortunately, after 4 cycles of treatment, she had acute mental status changes and was admitted here January of 2011 with what proved to be posterior reversible leukoencephalopathy and SIADH.  She had seizures, aphasia, and required intubation.  Once the patient recovered from this, she was treated with 2 cycles of single-agent carboplatin (I do not have the AUC). Her last adjuvant treatment was May 12, 2009, and her CA-125 at that time was 10.3.  Her intraperitoneal port was removed in April of 2011.    More recently, the patient was in routine follow up when her CA-125 was found to jump up to 2,269.7 (March 26, 2010).  This is 10 months after her last prior chemotherapy.   She had CTs of the chest, abdomen and pelvis January 26th which showed ascites and enhancing peritoneal nodularity. She had had similar findings at presentation but these had completely resolved by the time she finished treatment in March of 2011. The patient was felt to be in first relapse. Subsequent treatments are as summarized below   INTERVAL HISTORY:  [name] returns today with her daughter and daughter-in-law for followup of Regina West's ovarian cancer. Unfortunately her CA 125 has risen. We rechecked it just to make sure and it was even higher. Furthermore she is symptomatic. Accordingly her cancer has clearly returned, and she is here today to discuss upcoming plans.  REVIEW OF SYSTEMS:  West was doing well until about 2 or 3 weeks ago, when she started having some discomfort, not quite a pain, in her abdomen. There was some distention. She feels she needs to push to completely avoid when she urinates. There is no burning no hematuria and no frequency. She tells me she is drinking as much as she can't area she has had less energy lately. She has had no fever, no rash, no overt bleeding. She goes out to eat with friends several days a week. Mostly aside from that she stays home and watches TV. A detailed review of systems today was otherwise noncontributory  PAST MEDICAL HISTORY: Significant for hysterectomy at the age of 79 with concurrent bladder "tuck up."  She also underwent appendectomy during that procedure.  She is status post prior bilateral cataract surgery. She is status post tonsillectomy and adenoidectomy.  She has a history of hypertension and diabetes. She has a history of  diverticulosis.  She has a large hiatal hernia.  She has degenerative disk disease.  She has been noted to have coronary calcifications and she has hyperlipidemia.  There is a history of remote tobacco abuse. Seizure disorder as per HPI  FAMILY HISTORY The patient's father died at the age of 35 after heart surgery  for aortic stenosis.  The patient's mother died at the age of 41.  The patient has one brother in good health.  There is no other family history to her knowledge of ovarian or breast cancer.    GYNECOLOGIC HISTORY: Menarche at age 79.  She is GX P3 with menopause in her late 26s.  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 She is currently residing in [place]. Her husband was in sales and had a history of Parkinson's disease.  He died following a fall in the year 2000.  The patient's son Regina West lives in [place], and has a history of MS.  Daughter Regina West lives in [place]. She is a Futures trader.  Son Regina West lives in [place], [place] and is in Airline pilot. The patient has 8 grandchildren. She attends the [employer].  She is a good friend of our patients [name]   ADVANCED DIRECTIVES: in place  HEALTH MAINTENANCE: History  Substance Use Topics  . Smoking status: Former Smoker    Quit date: 05/24/1959  . Smokeless tobacco: Never Used  . Alcohol Use: No     Colonoscopy: 2008  PAP: UTD  MM: refuses  Allergies  Allergen Reactions  . Avastin [Bevacizumab] Other (See Comments)    Swelling of the brain     Current Outpatient Prescriptions  Medication Sig Dispense Refill  . acyclovir (ZOVIRAX) 400 MG tablet Take 1 tablet (400 mg total) by mouth 2 (two) times daily.  60 tablet  3  . antiseptic oral rinse (BIOTENE) LIQD 15 mLs by Mouth Rinse route 2 (two) times daily.  1 Bottle  0  . cholecalciferol (VITAMIN D) 1000 UNITS tablet Take 1,000 Units by mouth 2 (two) times daily.      Marland Kitchen dexamethasone (DECADRON) 4 MG tablet Take 2 tablets (8 mg total) by mouth 2 (two) times daily with a meal. Take two times a day starting the day after chemotherapy for 3 days.  30 tablet  1  . doxycycline (VIBRAMYCIN) 100 MG capsule Take 1 capsule (100 mg total) by mouth daily.  30  capsule  0  . levETIRAcetam (KEPPRA) 250 MG tablet Take 1 tablet (250 mg total) by mouth 2 (two) times daily.  60 tablet  0  . nitrofurantoin (MACRODANTIN) 50 MG capsule TAKE TWO CAPSULES BY MOUTH ONCE DAILY  60 capsule  3  . omeprazole (PRILOSEC) 40 MG capsule Take 1 capsule (40 mg total) by mouth at bedtime.  30 capsule  4  . VIIBRYD 40 MG TABS        No current facility-administered medications for this visit.    OBJECTIVE: Elderly white woman who appears stated age 79 Vitals:   01/14/13 1201  BP: 137/84  Pulse: 86  Temp: 97.8 F (36.6 C)  Resp: 20     Body mass index is 26.82 kg/(m^2).    ECOG FS: 1 Filed Weights   01/14/13 1201  Weight: 166 lb 1.6 oz (75.342 kg)   Sclerae unicteric, pupils equal round and reactive Oropharynx shows no thrush  or other lesions No cervical, supraclavicular, or axillary adenopathy Lungs clear to auscultation bilaterally, with good excursion Heart regular rate and rhythm Abdomen soft, minimally distended, nontender, positive bowel sounds, no masses palpated. MSK no focal spinal tenderness, minimal bilateral ankle edema Neuro: nonfocal, pleasant affect, well oriented Breasts: Deferred    LAB RESULTS: Results for TAUNIA, FRASCO (MRN [id number]) as of 01/14/2013 12:32  Ref. Range 06/11/2012 08:27 07/31/2012 13:45 08/20/2012 11:24 09/17/2012 10:57 10/01/2012 11:29 11/19/2012 10:12 01/07/2013 10:49 01/11/2013 11:29  CA 125 Latest Range: 0.0-30.2 U/mL 36.1 (H) 12.9 11.2 12.0 12.0 18.7 245.0 (H) 271.0 (H)    Lab Results  Component Value Date   CA125 271.0* 01/11/2013   Lab Results  Component Value Date   WBC 5.1 01/11/2013   NEUTROABS 3.8 01/11/2013   HGB 12.3 01/11/2013   HCT 36.9 01/11/2013   MCV 101.1* 01/11/2013   PLT 207 01/11/2013      Chemistry      Component Value Date/Time   NA 141 01/11/2013 1129   NA 136 10/17/2011 1118   NA 143 05/12/2011 1337   K 4.3 01/11/2013 1129   K 3.9 10/17/2011 1118   K 4.1 05/12/2011 1337   CL 100 08/20/2012  1124   CL 100 10/17/2011 1118   CL 92* 05/12/2011 1337   CO2 23 01/11/2013 1129   CO2 27 10/17/2011 1118   CO2 32 05/12/2011 1337   BUN 24.2 01/11/2013 1129   BUN 25* 10/17/2011 1118   BUN 19 05/12/2011 1337   CREATININE 1.4* 01/11/2013 1129   CREATININE 1.10 10/17/2011 1118   CREATININE 1.3* 05/12/2011 1337      Component Value Date/Time   CALCIUM 10.5* 01/11/2013 1129   CALCIUM 9.8 10/17/2011 1118   CALCIUM 9.8 05/12/2011 1337   ALKPHOS 129 01/11/2013 1129   ALKPHOS 109 10/17/2011 1118   ALKPHOS 95* 05/12/2011 1337   AST 18 01/11/2013 1129   AST 22 10/17/2011 1118   AST 40* 05/12/2011 1337   ALT 10 01/11/2013 1129   ALT 14 10/17/2011 1118   ALT 28 05/12/2011 1337   BILITOT 0.37 01/11/2013 1129   BILITOT 0.4 10/17/2011 1118   BILITOT 0.60 05/12/2011 1337      STUDIES: No results found.  ASSESSMENT: 79 y.o.  [place] woman   (1) status post optimal debulking of a primary peritoneal serous adenocarcinoma September 2010, the tumor being moderately differentiated, pT3c NX (stage IIIC), treated according to GOG 252 with  intraperitoneal platinum and paclitaxel x4 given along with bevacizumab, complicated by SIADHafter cycle 4, also with posterior reversible leukoencephalopathy.  After these problems resolved she received 2 additional cycles of single-agent carboplatin completed in March 2011  (2)  She had her first recurrence February 2012  treated with carboplatin and Gemzar for a total of 6 cycles between February and June 2012.  Her CEA-125 had normalized before the beginning of cycle 5.   (3) second recurrence documented February 2013,  treated with carboplatin/ doxil x 6, completed 10/03/2011, with a good response noted on restaging scans  (4) third recurrence documented February 2014, receiving single-agent carboplatin every 3 weeks, first dose 04/30/2012, completed 08/20/2012  (5) fourth recurrence documented by abdominal symptoms and a rising CA 125 November 2014  PLAN:  Kileigh's remission lasted  less than 6 months. This means she is currently "platinum resistant" and we have to consider other agents. She received paclitaxel originally 4 years ago, and I think she would do best with Abraxane at present. Certainly there  are other options.  [place]leigh wanted to know what would happen if we did not treat her at all. We discussed increasing abdominal distention, the possibility of bowel, bilateral duct, or ureteral blockage, and other possible consequences of uncontrolled ovarian cancer. I reassured her that if she ever decides against further treatment I will provide her with best supportive care and her pain will be well controlled.  However at this point my recommendation is that we give the Abraxane a try. If she does not tolerate it well, or if it does not work, we can revisit the hospice question or consider a different agent. The plan is for her to start treatment November 12. Azlee will return a week later to make sure she tolerated the first dose well and to receive her second dose, the plan being for treatments day 1 and day 8 of each 21 day cycle. If she does not tolerate that well we can drop the dose and eventually go to every 2 week treatments. We will be following her CEA 125 every 3 weeks. I wrote her prescriptions for ondansetron and prochlorperazine to take for nausea.  Taya and her family are very much in favor of this plan. They understand the goal of treatment is control. They know to call for any problems that may develop before her next visit here.  Tayana Shankle C    01/14/2013

## 2013-01-14 NOTE — Telephone Encounter (Signed)
Per staff message I have moved appt from 12/3 to 12/1

## 2013-01-15 ENCOUNTER — Encounter: Payer: Self-pay | Admitting: *Deleted

## 2013-01-15 ENCOUNTER — Other Ambulatory Visit: Payer: Self-pay | Admitting: *Deleted

## 2013-01-15 DIAGNOSIS — C569 Malignant neoplasm of unspecified ovary: Secondary | ICD-10-CM

## 2013-01-15 MED ORDER — ACYCLOVIR 400 MG PO TABS: 400.0000 mg | ORAL_TABLET | Freq: Two times a day (BID) | ORAL | Status: DC

## 2013-01-16 ENCOUNTER — Ambulatory Visit (HOSPITAL_BASED_OUTPATIENT_CLINIC_OR_DEPARTMENT_OTHER): Payer: Medicare Other

## 2013-01-16 ENCOUNTER — Ambulatory Visit: Payer: Medicare Other | Admitting: Oncology

## 2013-01-16 VITALS — BP 128/78 | HR 83 | Temp 98.7°F

## 2013-01-16 DIAGNOSIS — C482 Malignant neoplasm of peritoneum, unspecified: Secondary | ICD-10-CM

## 2013-01-16 DIAGNOSIS — C481 Malignant neoplasm of specified parts of peritoneum: Secondary | ICD-10-CM

## 2013-01-16 DIAGNOSIS — Z5111 Encounter for antineoplastic chemotherapy: Secondary | ICD-10-CM

## 2013-01-16 MED ORDER — ONDANSETRON 8 MG/NS 50 ML IVPB
INTRAVENOUS | Status: AC
  Filled 2013-01-16: qty 8

## 2013-01-16 MED ORDER — ONDANSETRON 8 MG/50ML IVPB (CHCC)
8.0000 mg | Freq: Once | INTRAVENOUS | Status: AC
  Administered 2013-01-16: 8 mg via INTRAVENOUS

## 2013-01-16 MED ORDER — HEPARIN SOD (PORK) LOCK FLUSH 100 UNIT/ML IV SOLN
500.0000 [IU] | Freq: Once | INTRAVENOUS | Status: AC | PRN
  Administered 2013-01-16: 500 [IU]
  Filled 2013-01-16: qty 5

## 2013-01-16 MED ORDER — DEXAMETHASONE SODIUM PHOSPHATE 20 MG/5ML IJ SOLN
INTRAMUSCULAR | Status: AC
  Filled 2013-01-16: qty 5

## 2013-01-16 MED ORDER — DEXAMETHASONE SODIUM PHOSPHATE 10 MG/ML IJ SOLN
4.0000 mg | Freq: Once | INTRAMUSCULAR | Status: AC
  Administered 2013-01-16: 4 mg via INTRAVENOUS

## 2013-01-16 MED ORDER — SODIUM CHLORIDE 0.9 % IV SOLN
Freq: Once | INTRAVENOUS | Status: AC
  Administered 2013-01-16: 14:00:00 via INTRAVENOUS

## 2013-01-16 MED ORDER — SODIUM CHLORIDE 0.9 % IJ SOLN
10.0000 mL | INTRAMUSCULAR | Status: DC | PRN
  Administered 2013-01-16: 10 mL
  Filled 2013-01-16: qty 10

## 2013-01-16 MED ORDER — DEXAMETHASONE SODIUM PHOSPHATE 10 MG/ML IJ SOLN
INTRAMUSCULAR | Status: AC
  Filled 2013-01-16: qty 1

## 2013-01-16 MED ORDER — PACLITAXEL PROTEIN-BOUND CHEMO INJECTION 100 MG
100.0000 mg/m2 | Freq: Once | INTRAVENOUS | Status: AC
  Administered 2013-01-16: 175 mg via INTRAVENOUS
  Filled 2013-01-16: qty 35

## 2013-01-16 NOTE — Patient Instructions (Signed)
[facility] Discharge Instructions for Patients Receiving Chemotherapy  Today you received the following chemotherapy agents; Abraxene  To help prevent nausea and vomiting after your treatment, we encourage you to take your nausea medication  As directed.    If you develop nausea and vomiting that is not controlled by your nausea medication, call the clinic.   BELOW ARE SYMPTOMS THAT SHOULD BE REPORTED IMMEDIATELY:  *FEVER GREATER THAN 100.5 F  *CHILLS WITH OR WITHOUT FEVER  NAUSEA AND VOMITING THAT IS NOT CONTROLLED WITH YOUR NAUSEA MEDICATION  *UNUSUAL SHORTNESS OF BREATH  *UNUSUAL BRUISING OR BLEEDING  TENDERNESS IN MOUTH AND THROAT WITH OR WITHOUT PRESENCE OF ULCERS  *URINARY PROBLEMS  *BOWEL PROBLEMS  UNUSUAL RASH Items with * indicate a potential emergency and should be followed up as soon as possible.  Feel free to call the clinic you have any questions or concerns. The clinic phone number is [phone].

## 2013-01-23 ENCOUNTER — Ambulatory Visit (HOSPITAL_BASED_OUTPATIENT_CLINIC_OR_DEPARTMENT_OTHER): Payer: Medicare Other

## 2013-01-23 ENCOUNTER — Ambulatory Visit (HOSPITAL_BASED_OUTPATIENT_CLINIC_OR_DEPARTMENT_OTHER): Payer: Medicare Other | Admitting: Physician Assistant

## 2013-01-23 ENCOUNTER — Other Ambulatory Visit (HOSPITAL_BASED_OUTPATIENT_CLINIC_OR_DEPARTMENT_OTHER): Payer: Medicare Other | Admitting: Lab

## 2013-01-23 ENCOUNTER — Telehealth: Payer: Self-pay | Admitting: *Deleted

## 2013-01-23 ENCOUNTER — Encounter: Payer: Self-pay | Admitting: Physician Assistant

## 2013-01-23 ENCOUNTER — Telehealth: Payer: Self-pay | Admitting: Oncology

## 2013-01-23 VITALS — BP 130/80 | HR 77 | Temp 97.8°F | Resp 20 | Ht 66.0 in | Wt 168.1 lb

## 2013-01-23 DIAGNOSIS — C481 Malignant neoplasm of specified parts of peritoneum: Secondary | ICD-10-CM

## 2013-01-23 DIAGNOSIS — C482 Malignant neoplasm of peritoneum, unspecified: Secondary | ICD-10-CM

## 2013-01-23 DIAGNOSIS — D649 Anemia, unspecified: Secondary | ICD-10-CM

## 2013-01-23 DIAGNOSIS — C569 Malignant neoplasm of unspecified ovary: Secondary | ICD-10-CM

## 2013-01-23 DIAGNOSIS — Z5111 Encounter for antineoplastic chemotherapy: Secondary | ICD-10-CM

## 2013-01-23 DIAGNOSIS — R209 Unspecified disturbances of skin sensation: Secondary | ICD-10-CM

## 2013-01-23 LAB — CBC WITH DIFFERENTIAL/PLATELET
Basophils Absolute: 0 10*3/uL (ref 0.0–0.1)
Eosinophils Absolute: 0.1 10*3/uL (ref 0.0–0.5)
HCT: 33.5 % — ABNORMAL LOW (ref 34.8–46.6)
HGB: 11.3 g/dL — ABNORMAL LOW (ref 11.6–15.9)
LYMPH%: 21.8 % (ref 14.0–49.7)
MONO#: 0.3 10*3/uL (ref 0.1–0.9)
MONO%: 7.4 % (ref 0.0–14.0)
NEUT#: 2.7 10*3/uL (ref 1.5–6.5)
NEUT%: 66.9 % (ref 38.4–76.8)
Platelets: 153 10*3/uL (ref 145–400)
WBC: 4.1 10*3/uL (ref 3.9–10.3)

## 2013-01-23 LAB — COMPREHENSIVE METABOLIC PANEL (CC13)
ALT: 15 U/L (ref 0–55)
Albumin: 3.6 g/dL (ref 3.5–5.0)
Alkaline Phosphatase: 108 U/L (ref 40–150)
Chloride: 104 mEq/L (ref 98–109)
Sodium: 138 mEq/L (ref 136–145)
Total Bilirubin: 0.33 mg/dL (ref 0.20–1.20)
Total Protein: 6.3 g/dL — ABNORMAL LOW (ref 6.4–8.3)

## 2013-01-23 MED ORDER — ONDANSETRON 8 MG/50ML IVPB (CHCC)
8.0000 mg | Freq: Once | INTRAVENOUS | Status: AC
  Administered 2013-01-23: 8 mg via INTRAVENOUS

## 2013-01-23 MED ORDER — PACLITAXEL PROTEIN-BOUND CHEMO INJECTION 100 MG
100.0000 mg/m2 | Freq: Once | INTRAVENOUS | Status: AC
  Administered 2013-01-23: 175 mg via INTRAVENOUS
  Filled 2013-01-23: qty 35

## 2013-01-23 MED ORDER — HEPARIN SOD (PORK) LOCK FLUSH 100 UNIT/ML IV SOLN
500.0000 [IU] | Freq: Once | INTRAVENOUS | Status: AC | PRN
  Administered 2013-01-23: 500 [IU]
  Filled 2013-01-23: qty 5

## 2013-01-23 MED ORDER — DEXAMETHASONE SODIUM PHOSPHATE 10 MG/ML IJ SOLN
INTRAMUSCULAR | Status: AC
  Filled 2013-01-23: qty 1

## 2013-01-23 MED ORDER — DEXAMETHASONE SODIUM PHOSPHATE 10 MG/ML IJ SOLN
4.0000 mg | Freq: Once | INTRAMUSCULAR | Status: AC
  Administered 2013-01-23: 4 mg via INTRAVENOUS

## 2013-01-23 MED ORDER — SODIUM CHLORIDE 0.9 % IJ SOLN
10.0000 mL | INTRAMUSCULAR | Status: DC | PRN
  Administered 2013-01-23: 10 mL
  Filled 2013-01-23: qty 10

## 2013-01-23 MED ORDER — SODIUM CHLORIDE 0.9 % IV SOLN
Freq: Once | INTRAVENOUS | Status: AC
  Administered 2013-01-23: 14:00:00 via INTRAVENOUS

## 2013-01-23 NOTE — Progress Notes (Signed)
ID: Regina West   DOB: 27-Nov-1929  MR#: [id number]  KGM#:[id number]  PCP: Regina Meo, MD GYN: SU: Regina Drape, MD OTHER MD: Regina Novas, MD  CHIEF COMPLAINT:  Recurrent ovarian cancer  HISTORY OF PRESENT ILLNESS: The patient originally presented in the summer of 2010 with cramps and abdominal distension.  I do not have a copy of the initial evaluation, but on November 26, 2008, the patient underwent optimal debulking with bilateral salpingo-oophorectomy, omentectomy, and the placement of an intraperitoneal port.  The pathology from that procedure (Accession Number [id number] at [facility]) showed first of all, significant involvement of the omentum, minimal involvement of the right ovary and right fallopian tube and negative on the left ovary and fallopian tube, with neither ovary being enlarged, consistent with a primary peritoneal serous adenocarcinoma, described as moderately differentiated.  The sample included no lymph nodes.    The patient had an intraperitoneal port placed in the same surgery and was treated with intraperitoneal and IV chemotherapy according to GOG-252 but I am not sure which arm she was in.  (Arm 2 did carboplatin intraperitoneally and Taxol IV.  Arm 3 did cisplatin and paclitaxel intraperitoneally with paclitaxel IV.)  All arms received bevacizumab.  Unfortunately, after 4 cycles of treatment, she had acute mental status changes and was admitted here January of 2011 with what proved to be posterior reversible leukoencephalopathy and SIADH.  She had seizures, aphasia, and required intubation.  Once the patient recovered from this, she was treated with 2 cycles of single-agent carboplatin (I do not have the AUC). Her last adjuvant treatment was May 12, 2009, and her CA-125 at that time was 10.3.  Her intraperitoneal port was removed in April of 2011.    More recently, the patient was in routine follow up when her CA-125 was found to jump up to 2,269.7 (March 26, 2010).   This is 10 months after her last prior chemotherapy.  She had CTs of the chest, abdomen and pelvis January 26th which showed ascites and enhancing peritoneal nodularity. She had had similar findings at presentation but these had completely resolved by the time she finished treatment in March of 2011. The patient was felt to be in first relapse. Subsequent treatments are as summarized below   INTERVAL HISTORY:  Regina West returns today and is seen alone in an exam room while receiving her day 8 cycle 1 dose of Abraxane. (Somehow there was a mixup and she was taken to the infusion room prior to our appointment today.)   She tolerated last week's dose of Abraxane well, her only complaint being some increased fatigue.   Her abdomen is still distended, but she feels like this might have improved very slightly since last week. It certainly has not worsened. She's having bowel movements daily. She is able to urinate but does feel that she has to force the residual urine out completely empty her bladder. She denies any actual dysuria or hematuria.   Apparently Regina West is having lots of conversations with her family regarding "when and how to decide when it's been enough".  She seems to be very realistic about her disease process and her prognosis, but she is also very willing to be treated as long as it is reasonable.  She tells me, "one child thinks I  going to live forever. One child thinks I am going to die tomorrow. And the third child doesn't really know what to think."     REVIEW OF SYSTEMS:  Regina West has had no  fevers or chills.  She's had no skin changes or rashes and denies any abnormal bleeding. She's had no mouth ulcers, oral sensitivity, or problems swallowing. Her appetite is fair. She's had no recent cough, but does feel like her shortness of breath is a little more. She has low endurance, and is unable to exercise currently. She's had no chest pain or palpitations. She denies any abnormal headaches or  dizziness. She does have some swelling in her ankles that comes and there's. She doesn't seem to have peripheral neuropathy but admits that her fingers are "less agile than they used to be". She's had no signs of increased numbness or tingling since initiating the Abraxane.  A detailed review of systems is otherwise stable and noncontributory.   PAST MEDICAL HISTORY: Significant for hysterectomy at the age of 30 with concurrent bladder "tuck up."  She also underwent appendectomy during that procedure.  She is status post prior bilateral cataract surgery. She is status post tonsillectomy and adenoidectomy.  She has a history of hypertension and diabetes. She has a history of diverticulosis.  She has a large hiatal hernia.  She has degenerative disk disease.  She has been noted to have coronary calcifications and she has hyperlipidemia.  There is a history of remote tobacco abuse. Seizure disorder as per HPI  FAMILY HISTORY The patient's father died at the age of 53 after heart surgery for aortic stenosis.  The patient's mother died at the age of 38.  The patient has one brother in good health.  There is no other family history to her knowledge of ovarian or breast cancer.    GYNECOLOGIC HISTORY: Menarche at age 57.  She is GX P3 with menopause in her late 31s.  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 She is currently residing in [place]. Her husband was in sales and had a history of Parkinson's disease.  He died following a fall in the year 2000.  The patient's son Regina West lives in [place], and has a history of MS.  Daughter Regina West lives in [place]. She is a Futures trader.  Son Regina West lives in [place], [place] and is in Airline pilot. The patient has 8 grandchildren. She attends the [employer].  She is a good friend of our patients [name]   ADVANCED DIRECTIVES: in  place  HEALTH MAINTENANCE: History  Substance Use Topics  . Smoking status: Former Smoker    Quit date: 05/24/1959  . Smokeless tobacco: Never Used  . Alcohol Use: No     Colonoscopy: 2008  PAP: UTD  MM: refuses   Allergies  Allergen Reactions  . Avastin [Bevacizumab] Other (See Comments)    Swelling of the brain     Current Outpatient Prescriptions  Medication Sig Dispense Refill  . acyclovir (ZOVIRAX) 400 MG tablet Take 1 tablet (400 mg total) by mouth 2 (two) times daily.  60 tablet  12  . antiseptic oral rinse (BIOTENE) LIQD 15 mLs by Mouth Rinse route 2 (two) times daily.  1 Bottle  0  . cholecalciferol (VITAMIN D) 1000 UNITS tablet Take 1,000 Units by mouth 2 (two) times daily.      Marland Kitchen doxycycline (VIBRAMYCIN) 100 MG capsule Take 1 capsule (100 mg total) by mouth daily.  30 capsule  0  . levETIRAcetam (KEPPRA) 250 MG tablet Take 1 tablet (250 mg total) by mouth 2 (  two) times daily.  60 tablet  0  . nitrofurantoin (MACRODANTIN) 50 MG capsule TAKE TWO CAPSULES BY MOUTH ONCE DAILY  60 capsule  3  . omeprazole (PRILOSEC) 40 MG capsule Take 1 capsule (40 mg total) by mouth at bedtime.  30 capsule  4  . ondansetron (ZOFRAN) 8 MG tablet Take 1 tablet (8 mg total) by mouth 2 (two) times daily. Take two times a day starting the day after chemo for 2 days. Then take two times a day as needed for nausea or vomiting.  30 tablet  1  . VIIBRYD 40 MG TABS       . prochlorperazine (COMPAZINE) 10 MG tablet Take 1 tablet (10 mg total) by mouth every 6 (six) hours as needed (Nausea or vomiting).  30 tablet  1   No current facility-administered medications for this visit.   Facility-Administered Medications Ordered in Other Visits  Medication Dose Route Frequency Provider Last Rate Last Dose  . heparin lock flush 100 unit/mL  500 Units Intracatheter Once PRN Lowella Dell, MD      . PACLitaxel-protein bound (ABRAXANE) chemo infusion 175 mg  100 mg/m2 (Treatment Plan Actual) Intravenous  Once Lowella Dell, MD 70 mL/hr at 01/23/13 1552 175 mg at 01/23/13 1552  . sodium chloride 0.9 % injection 10 mL  10 mL Intracatheter PRN Lowella Dell, MD        OBJECTIVE: Elderly white woman who appears stated age and is in no acute distress Filed Vitals:   01/23/13 1414  BP: 130/80  Pulse: 77  Temp: 97.8 F (36.6 C)  Resp: 20     Body mass index is 27.15 kg/(m^2).    ECOG FS: 1 Filed Weights   01/23/13 1414  Weight: 168 lb 1.6 oz (76.25 kg)   Physical Exam: HEENT:  Sclerae anicteric.  Oropharynx clear.  BREAST EXAM:  Deferred NODES:  No cervical or supraclavicular lymphadenopathy LUNGS:  Clear to auscultation bilaterally.  No wheezes or rhonchi. Good excursion bilaterally. HEART:  Regular rate and rhythm. ABDOMEN:  Soft, slightly distended, nontender to palpation.  Positive bowel sounds.  EXTREMITIES:  Nonpitting pedal edema, equal bilaterally. No upper extremity edema is noted. NEURO:  Nonfocal. Well oriented.  Appropriate affect.    LAB RESULTS: Results for Regina West, CHIEFFO (MRN [id number]) as of 01/14/2013 12:32  Ref. Range 06/11/2012 08:27 07/31/2012 13:45 08/20/2012 11:24 09/17/2012 10:57 10/01/2012 11:29 11/19/2012 10:12 01/07/2013 10:49 01/11/2013 11:29  CA 125 Latest Range: 0.0-30.2 U/mL 36.1 (H) 12.9 11.2 12.0 12.0 18.7 245.0 (H) 271.0 (H)     Lab Results  Component Value Date   WBC 4.1 01/23/2013   NEUTROABS 2.7 01/23/2013   HGB 11.3* 01/23/2013   HCT 33.5* 01/23/2013   MCV 98.8 01/23/2013   PLT 153 01/23/2013      Chemistry      Component Value Date/Time   NA 138 01/23/2013 1317   NA 136 10/17/2011 1118   NA 143 05/12/2011 1337   K 4.6 01/23/2013 1317   K 3.9 10/17/2011 1118   K 4.1 05/12/2011 1337   CL 100 08/20/2012 1124   CL 100 10/17/2011 1118   CL 92* 05/12/2011 1337   CO2 26 01/23/2013 1317   CO2 27 10/17/2011 1118   CO2 32 05/12/2011 1337   BUN 26.1* 01/23/2013 1317   BUN 25* 10/17/2011 1118   BUN 19 05/12/2011 1337   CREATININE 1.1 01/23/2013  1317   CREATININE 1.10 10/17/2011 1118   CREATININE 1.3* 05/12/2011  1337      Component Value Date/Time   CALCIUM 9.9 01/23/2013 1317   CALCIUM 9.8 10/17/2011 1118   CALCIUM 9.8 05/12/2011 1337   ALKPHOS 108 01/23/2013 1317   ALKPHOS 109 10/17/2011 1118   ALKPHOS 95* 05/12/2011 1337   AST 18 01/23/2013 1317   AST 22 10/17/2011 1118   AST 40* 05/12/2011 1337   ALT 15 01/23/2013 1317   ALT 14 10/17/2011 1118   ALT 28 05/12/2011 1337   BILITOT 0.33 01/23/2013 1317   BILITOT 0.4 10/17/2011 1118   BILITOT 0.60 05/12/2011 1337      STUDIES: No results found.    ASSESSMENT: 79 y.o.  Regina West woman   (1) status post optimal debulking of a primary peritoneal serous adenocarcinoma September 2010, the tumor being moderately differentiated, pT3c NX (stage IIIC), treated according to GOG 252 with  intraperitoneal platinum and paclitaxel x4 given along with bevacizumab, complicated by SIADHafter cycle 4, also with posterior reversible leukoencephalopathy.  After these problems resolved she received 2 additional cycles of single-agent carboplatin completed in March 2011  (2)  She had her first recurrence February 2012  treated with carboplatin and Gemzar for a total of 6 cycles between February and June 2012.  Her CEA-125 had normalized before the beginning of cycle 5.   (3) second recurrence documented February 2013,  treated with carboplatin/ doxil x 6, completed 10/03/2011, with a good response noted on restaging scans  (4) third recurrence documented February 2014, receiving single-agent carboplatin every 3 weeks, first dose 04/30/2012, completed 08/20/2012  (5) fourth recurrence documented by abdominal symptoms and a rising CA 125 November 2014, receiving Abraxane on days one and 8 of each 21 day cycle, with first dose given on 01/16/2013.  PLAN:  Rain will continue on the same regimen, and will receive her day 8 dose of Abraxane today as scheduled. She will have next week "off" for Thanksgiving,  and will return on December 1 to see Dr. Darnelle Catalan in anticipation of day 1 cycle 2. I will mention that beginning with cycle 2 we are moving her treatments from Wednesday to Monday since that is more convenient for her family. If she does not tolerate the Abraxane well, we can drop the dose and eventually go to every 2 week treatments if necessary. We will continue following her CEA 125 every 3 weeks.  Liseth voices understanding and agreement with our plan thus far, and will cause with any changes or problems.   Daphnee Preiss PA-C     01/23/2013

## 2013-01-23 NOTE — Patient Instructions (Signed)

## 2013-01-23 NOTE — Telephone Encounter (Signed)
Per staff message and POF I have scheduled appts.  JMW  

## 2013-01-24 ENCOUNTER — Telehealth: Payer: Self-pay | Admitting: *Deleted

## 2013-01-24 ENCOUNTER — Encounter: Payer: Self-pay | Admitting: Oncology

## 2013-01-24 NOTE — Telephone Encounter (Signed)
Per staff message I have moved appt from 12/9 to 12/8

## 2013-01-28 ENCOUNTER — Telehealth: Payer: Self-pay | Admitting: *Deleted

## 2013-01-28 NOTE — Telephone Encounter (Signed)
Patient's family member called regarding her appts. I h ave given her the appts listed in computer. Also advised her that we schedule treatment appts one past the next MD visit.

## 2013-02-02 ENCOUNTER — Other Ambulatory Visit: Payer: Self-pay | Admitting: Oncology

## 2013-02-04 ENCOUNTER — Ambulatory Visit (HOSPITAL_BASED_OUTPATIENT_CLINIC_OR_DEPARTMENT_OTHER): Payer: Medicare Other | Admitting: Oncology

## 2013-02-04 ENCOUNTER — Ambulatory Visit (HOSPITAL_BASED_OUTPATIENT_CLINIC_OR_DEPARTMENT_OTHER): Payer: Medicare Other

## 2013-02-04 ENCOUNTER — Other Ambulatory Visit (HOSPITAL_BASED_OUTPATIENT_CLINIC_OR_DEPARTMENT_OTHER): Payer: Medicare Other | Admitting: Lab

## 2013-02-04 VITALS — BP 130/75 | HR 82 | Temp 98.4°F | Resp 18 | Ht 66.0 in | Wt 169.3 lb

## 2013-02-04 DIAGNOSIS — C569 Malignant neoplasm of unspecified ovary: Secondary | ICD-10-CM

## 2013-02-04 DIAGNOSIS — C482 Malignant neoplasm of peritoneum, unspecified: Secondary | ICD-10-CM

## 2013-02-04 DIAGNOSIS — Z5111 Encounter for antineoplastic chemotherapy: Secondary | ICD-10-CM

## 2013-02-04 LAB — COMPREHENSIVE METABOLIC PANEL (CC13)
AST: 23 U/L (ref 5–34)
Albumin: 3.4 g/dL — ABNORMAL LOW (ref 3.5–5.0)
Alkaline Phosphatase: 102 U/L (ref 40–150)
Anion Gap: 9 mEq/L (ref 3–11)
CO2: 26 mEq/L (ref 22–29)
Glucose: 134 mg/dl (ref 70–140)
Potassium: 4.6 mEq/L (ref 3.5–5.1)
Sodium: 141 mEq/L (ref 136–145)
Total Bilirubin: 0.4 mg/dL (ref 0.20–1.20)
Total Protein: 6.1 g/dL — ABNORMAL LOW (ref 6.4–8.3)

## 2013-02-04 LAB — CBC WITH DIFFERENTIAL/PLATELET
BASO%: 0.8 % (ref 0.0–2.0)
Basophils Absolute: 0 10*3/uL (ref 0.0–0.1)
EOS%: 4.1 % (ref 0.0–7.0)
HGB: 10.3 g/dL — ABNORMAL LOW (ref 11.6–15.9)
MCH: 33.3 pg (ref 25.1–34.0)
MCHC: 33 g/dL (ref 31.5–36.0)
MCV: 101 fL (ref 79.5–101.0)
MONO%: 22 % — ABNORMAL HIGH (ref 0.0–14.0)
RBC: 3.09 10*6/uL — ABNORMAL LOW (ref 3.70–5.45)
RDW: 13.6 % (ref 11.2–14.5)
lymph#: 0.7 10*3/uL — ABNORMAL LOW (ref 0.9–3.3)

## 2013-02-04 MED ORDER — PACLITAXEL PROTEIN-BOUND CHEMO INJECTION 100 MG
100.0000 mg/m2 | Freq: Once | INTRAVENOUS | Status: AC
  Administered 2013-02-04: 175 mg via INTRAVENOUS
  Filled 2013-02-04: qty 35

## 2013-02-04 MED ORDER — ONDANSETRON 8 MG/50ML IVPB (CHCC)
8.0000 mg | Freq: Once | INTRAVENOUS | Status: AC
  Administered 2013-02-04: 8 mg via INTRAVENOUS

## 2013-02-04 MED ORDER — ONDANSETRON 8 MG/NS 50 ML IVPB
INTRAVENOUS | Status: AC
  Filled 2013-02-04: qty 8

## 2013-02-04 MED ORDER — HEPARIN SOD (PORK) LOCK FLUSH 100 UNIT/ML IV SOLN
500.0000 [IU] | Freq: Once | INTRAVENOUS | Status: AC | PRN
  Administered 2013-02-04: 500 [IU]
  Filled 2013-02-04: qty 5

## 2013-02-04 MED ORDER — SODIUM CHLORIDE 0.9 % IJ SOLN
10.0000 mL | INTRAMUSCULAR | Status: DC | PRN
  Administered 2013-02-04: 10 mL
  Filled 2013-02-04: qty 10

## 2013-02-04 MED ORDER — DEXAMETHASONE SODIUM PHOSPHATE 10 MG/ML IJ SOLN
4.0000 mg | Freq: Once | INTRAMUSCULAR | Status: AC
  Administered 2013-02-04: 4 mg via INTRAVENOUS

## 2013-02-04 MED ORDER — SODIUM CHLORIDE 0.9 % IV SOLN
Freq: Once | INTRAVENOUS | Status: AC
Start: 2013-02-04 — End: 2013-02-04
  Administered 2013-02-04: 14:00:00 via INTRAVENOUS

## 2013-02-04 MED ORDER — DEXAMETHASONE SODIUM PHOSPHATE 10 MG/ML IJ SOLN
INTRAMUSCULAR | Status: AC
  Filled 2013-02-04: qty 1

## 2013-02-04 NOTE — Patient Instructions (Signed)
[facility] Discharge Instructions for Patients Receiving Chemotherapy  Today you received the following chemotherapy agent: Abraxane   To help prevent nausea and vomiting after your treatment, we encourage you to take your nausea medication as prescribed.    If you develop nausea and vomiting that is not controlled by your nausea medication, call the clinic.   BELOW ARE SYMPTOMS THAT SHOULD BE REPORTED IMMEDIATELY:  *FEVER GREATER THAN 100.5 F  *CHILLS WITH OR WITHOUT FEVER  NAUSEA AND VOMITING THAT IS NOT CONTROLLED WITH YOUR NAUSEA MEDICATION  *UNUSUAL SHORTNESS OF BREATH  *UNUSUAL BRUISING OR BLEEDING  TENDERNESS IN MOUTH AND THROAT WITH OR WITHOUT PRESENCE OF ULCERS  *URINARY PROBLEMS  *BOWEL PROBLEMS  UNUSUAL RASH Items with * indicate a potential emergency and should be followed up as soon as possible.  Feel free to call the clinic you have any questions or concerns. The clinic phone number is [phone].    

## 2013-02-04 NOTE — Progress Notes (Signed)
Neut 1.1 today; Per Dr. Darnelle Catalan, OK to proceed with chemotherapy.

## 2013-02-04 NOTE — Progress Notes (Signed)
ID: Regina West   DOB: 02-Feb-1930  MR#: [id number]  NWG#:[id number]  PCP: Minda Meo, MD GYN: SU: Ivar Drape, MD OTHER MD: Melvyn Novas, MD  CHIEF COMPLAINT:  Recurrent ovarian cancer  HISTORY OF PRESENT ILLNESS: The patient originally presented in the summer of 2010 with cramps and abdominal distension.  I do not have a copy of the initial evaluation, but on November 26, 2008, the patient underwent optimal debulking with bilateral salpingo-oophorectomy, omentectomy, and the placement of an intraperitoneal port.  The pathology from that procedure (Accession Number [id number] at [facility]) showed first of all, significant involvement of the omentum, minimal involvement of the right ovary and right fallopian tube and negative on the left ovary and fallopian tube, with neither ovary being enlarged, consistent with a primary peritoneal serous adenocarcinoma, described as moderately differentiated.  The sample included no lymph nodes.    The patient had an intraperitoneal port placed in the same surgery and was treated with intraperitoneal and IV chemotherapy according to GOG-252 but I am not sure which arm she was in.  (Arm 2 did carboplatin intraperitoneally and Taxol IV.  Arm 3 did cisplatin and paclitaxel intraperitoneally with paclitaxel IV.)  All arms received bevacizumab.  Unfortunately, after 4 cycles of treatment, she had acute mental status changes and was admitted here January of 2011 with what proved to be posterior reversible leukoencephalopathy and SIADH.  She had seizures, aphasia, and required intubation.  Once the patient recovered from this, she was treated with 2 cycles of single-agent carboplatin (I do not have the AUC). Her last adjuvant treatment was May 12, 2009, and her CA-125 at that time was 10.3.  Her intraperitoneal port was removed in April of 2011.    More recently, the patient was in routine follow up when her CA-125 was found to jump up to 2,269.7 (March 26, 2010).   This is 10 months after her last prior chemotherapy.  She had CTs of the chest, abdomen and pelvis January 26th which showed ascites and enhancing peritoneal nodularity. She had had similar findings at presentation but these had completely resolved by the time she finished treatment in March of 2011. The patient was felt to be in first relapse. Subsequent treatments are as summarized below   INTERVAL HISTORY:  Regina West returns today with her daughter for day 1 cycle 2  of Abraxane. She did remarkably well with the first cycle, and specifically had no problems with nausea, no "first dose affect", and no peripheral neuropathy symptoms beyond and minimal that she had previously  REVIEW OF SYSTEMS:  Regina West had an unpleasant experience with port access last time. She described it to me in detail. I have alerted our head nurse regarding that. Aside from that she feels very tired and lethargic. Despite this she really enjoyed the holidays and just came back from a trip to the beach. She has stress urinary incontinence, feels anxious but not depressed, has had no bleeding, rash, or fever. She feels a little bloated but less so than before. A detailed review systems today was otherwise noncontributory  PAST MEDICAL HISTORY: Significant for hysterectomy at the age of 9 with concurrent bladder "tuck up."  She also underwent appendectomy during that procedure.  She is status post prior bilateral cataract surgery. She is status post tonsillectomy and adenoidectomy.  She has a history of hypertension and diabetes. She has a history of diverticulosis.  She has a large hiatal hernia.  She has degenerative disk disease.  She has been  noted to have coronary calcifications and she has hyperlipidemia.  There is a history of remote tobacco abuse. Seizure disorder as per HPI  FAMILY HISTORY The patient's father died at the age of 20 after heart surgery for aortic stenosis.  The patient's mother died at the age of 84.  The  patient has one brother in good health.  There is no other family history to her knowledge of ovarian or breast cancer.    GYNECOLOGIC HISTORY: Menarche at age 53.  She is GX P3 with menopause in her late 94s.  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 She is currently residing in [place]. Her husband was in sales and had a history of Parkinson's disease.  He died following a fall in the year 2000.  The patient's son [name] lives in [place], and has a history of MS.  Daughter [name] lives in [place]. She is a Futures trader.  Son [name] lives in [place], [place] and is in Airline pilot. The patient has 8 grandchildren. She attends the [employer].  She is a good friend of our patients [name]   ADVANCED DIRECTIVES: in place  HEALTH MAINTENANCE: History  Substance Use Topics  . Smoking status: Former Smoker    Quit date: 05/24/1959  . Smokeless tobacco: Never Used  . Alcohol Use: No     Colonoscopy: 2008  PAP: UTD  MM: refuses   Allergies  Allergen Reactions  . Avastin [Bevacizumab] Other (See Comments)    Swelling of the brain     Current Outpatient Prescriptions  Medication Sig Dispense Refill  . acyclovir (ZOVIRAX) 400 MG tablet Take 1 tablet (400 mg total) by mouth 2 (two) times daily.  60 tablet  12  . antiseptic oral rinse (BIOTENE) LIQD 15 mLs by Mouth Rinse route 2 (two) times daily.  1 Bottle  0  . cholecalciferol (VITAMIN D) 1000 UNITS tablet Take 1,000 Units by mouth 2 (two) times daily.      Marland Kitchen doxycycline (VIBRAMYCIN) 100 MG capsule Take 1 capsule (100 mg total) by mouth daily.  30 capsule  0  . levETIRAcetam (KEPPRA) 250 MG tablet Take 1 tablet (250 mg total) by mouth 2 (two) times daily.  60 tablet  0  . nitrofurantoin (MACRODANTIN) 50 MG capsule TAKE TWO CAPSULES BY MOUTH ONCE DAILY  60 capsule  3  . omeprazole (PRILOSEC) 40 MG capsule  Take 1 capsule (40 mg total) by mouth at bedtime.  30 capsule  4  . ondansetron (ZOFRAN) 8 MG tablet Take 1 tablet (8 mg total) by mouth 2 (two) times daily. Take two times a day starting the day after chemo for 2 days. Then take two times a day as needed for nausea or vomiting.  30 tablet  1  . prochlorperazine (COMPAZINE) 10 MG tablet Take 1 tablet (10 mg total) by mouth every 6 (six) hours as needed (Nausea or vomiting).  30 tablet  1  . VIIBRYD 40 MG TABS        No current facility-administered medications for this visit.    OBJECTIVE: Elderly white woman who appears stated age  28 Vitals:   02/04/13 1258  BP: 130/75  Pulse: 82  Temp: 98.4 F (36.9 C)  Resp: 18     Body mass index is 27.34 kg/(m^2).    ECOG FS: 1 Filed Weights   02/04/13  1258  Weight: 169 lb 4.8 oz (76.794 kg)   Sclerae unicteric, pupils equal and reactive Oropharynx clear and moist-- no thrush No cervical or supraclavicular adenopathy Lungs no rales or rhonchi Heart regular rate and rhythm Abd soft, nontender, no obvious ascites noted, positive bowel sounds MSK no focal spinal tenderness, no upper extremity lymphedema Neuro: nonfocal, well oriented, pleasant affect Breasts: Deferred   LAB RESULTS: CA 125 has dropped to 201 from greater than 270 previously   Lab Results  Component Value Date   WBC 2.4* 02/04/2013   NEUTROABS 1.1* 02/04/2013   HGB 10.3* 02/04/2013   HCT 31.2* 02/04/2013   MCV 101.0 02/04/2013   PLT 154 02/04/2013      Chemistry      Component Value Date/Time   NA 138 01/23/2013 1317   NA 136 10/17/2011 1118   NA 143 05/12/2011 1337   K 4.6 01/23/2013 1317   K 3.9 10/17/2011 1118   K 4.1 05/12/2011 1337   CL 100 08/20/2012 1124   CL 100 10/17/2011 1118   CL 92* 05/12/2011 1337   CO2 26 01/23/2013 1317   CO2 27 10/17/2011 1118   CO2 32 05/12/2011 1337   BUN 26.1* 01/23/2013 1317   BUN 25* 10/17/2011 1118   BUN 19 05/12/2011 1337   CREATININE 1.1 01/23/2013 1317   CREATININE 1.10  10/17/2011 1118   CREATININE 1.3* 05/12/2011 1337      Component Value Date/Time   CALCIUM 9.9 01/23/2013 1317   CALCIUM 9.8 10/17/2011 1118   CALCIUM 9.8 05/12/2011 1337   ALKPHOS 108 01/23/2013 1317   ALKPHOS 109 10/17/2011 1118   ALKPHOS 95* 05/12/2011 1337   AST 18 01/23/2013 1317   AST 22 10/17/2011 1118   AST 40* 05/12/2011 1337   ALT 15 01/23/2013 1317   ALT 14 10/17/2011 1118   ALT 28 05/12/2011 1337   BILITOT 0.33 01/23/2013 1317   BILITOT 0.4 10/17/2011 1118   BILITOT 0.60 05/12/2011 1337      STUDIES: No results found.    ASSESSMENT: 79 y.o.  [place] woman   (1) status post optimal debulking of a primary peritoneal serous adenocarcinoma September 2010, the tumor being moderately differentiated, pT3c NX (stage IIIC), treated according to GOG 252 with  intraperitoneal platinum and paclitaxel x4 given along with bevacizumab, complicated by SIADHafter cycle 4, also with posterior reversible leukoencephalopathy.  After these problems resolved she received 2 additional cycles of single-agent carboplatin completed in March 2011  (2)  She had her first recurrence February 2012  treated with carboplatin and Gemzar for a total of 6 cycles between February and June 2012.  Her CEA-125 had normalized before the beginning of cycle 5.   (3) second recurrence documented February 2013,  treated with carboplatin/ doxil x 6, completed 10/03/2011, with a good response noted on restaging scans  (4) third recurrence documented February 2014, receiving single-agent carboplatin every 3 weeks, first dose 04/30/2012, completed 08/20/2012  (5) fourth recurrence documented by abdominal symptoms and a rising CA 125 November 2014, receiving Abraxane on days one and 8 of each 21 day cycle, with first dose given on 01/16/2013.  PLAN:  Regina West has tolerated the first cycle of Abraxane quite well. We already seeing evidence of response, in her decreased CEA 125. She did have an unpleasant experience with port  access last time and I have alerted our head nurse, Regina West, to visit with the patient today gives a..  Otherwise Regina West will return next week, for day  8 cycle 2. My suspicion is that her counts will not allow her to receive treatment that day. If that is true, we will probably go to every 2 week Abraxane as one option, the other option being to give her Neupogen 3 days after the day 1 treatments.  She was interested in receiving a flu shot and possibly a pneumonia booster. She will get no benefit for either of those while receiving chemotherapy. However if we skip her dose next week, we may be able to sleep those vaccinations in and perhaps she will have just enough time to make some immune response before chemotherapy has resumed.  Regina West has a good understanding of all this and she is very much in agreement with appears to notable for any problems that may develop before her next visit.   Regina Dell MD     02/04/2013

## 2013-02-05 NOTE — Addendum Note (Signed)
Addended by: Laroy Apple E on: 02/05/2013 11:58 AM   Modules accepted: Orders

## 2013-02-06 ENCOUNTER — Ambulatory Visit: Payer: Medicare Other

## 2013-02-11 ENCOUNTER — Other Ambulatory Visit: Payer: Self-pay | Admitting: *Deleted

## 2013-02-11 ENCOUNTER — Ambulatory Visit (HOSPITAL_BASED_OUTPATIENT_CLINIC_OR_DEPARTMENT_OTHER): Payer: Medicare Other

## 2013-02-11 ENCOUNTER — Other Ambulatory Visit (HOSPITAL_BASED_OUTPATIENT_CLINIC_OR_DEPARTMENT_OTHER): Payer: Medicare Other | Admitting: Lab

## 2013-02-11 ENCOUNTER — Ambulatory Visit (HOSPITAL_BASED_OUTPATIENT_CLINIC_OR_DEPARTMENT_OTHER): Payer: Medicare Other | Admitting: Physician Assistant

## 2013-02-11 ENCOUNTER — Encounter: Payer: Self-pay | Admitting: Physician Assistant

## 2013-02-11 ENCOUNTER — Telehealth: Payer: Self-pay | Admitting: *Deleted

## 2013-02-11 VITALS — BP 125/73 | HR 88 | Temp 98.6°F | Resp 18 | Ht 66.0 in | Wt 169.7 lb

## 2013-02-11 DIAGNOSIS — C482 Malignant neoplasm of peritoneum, unspecified: Secondary | ICD-10-CM

## 2013-02-11 DIAGNOSIS — Z23 Encounter for immunization: Secondary | ICD-10-CM

## 2013-02-11 DIAGNOSIS — Z5111 Encounter for antineoplastic chemotherapy: Secondary | ICD-10-CM

## 2013-02-11 DIAGNOSIS — D649 Anemia, unspecified: Secondary | ICD-10-CM

## 2013-02-11 LAB — CBC WITH DIFFERENTIAL/PLATELET
Eosinophils Absolute: 0.1 10*3/uL (ref 0.0–0.5)
HCT: 30.9 % — ABNORMAL LOW (ref 34.8–46.6)
LYMPH%: 21.8 % (ref 14.0–49.7)
MCH: 33.2 pg (ref 25.1–34.0)
MCV: 99.7 fL (ref 79.5–101.0)
MONO#: 0.3 10*3/uL (ref 0.1–0.9)
NEUT#: 2.5 10*3/uL (ref 1.5–6.5)
NEUT%: 67.3 % (ref 38.4–76.8)
Platelets: 159 10*3/uL (ref 145–400)
RBC: 3.1 10*6/uL — ABNORMAL LOW (ref 3.70–5.45)
WBC: 3.8 10*3/uL — ABNORMAL LOW (ref 3.9–10.3)

## 2013-02-11 LAB — COMPREHENSIVE METABOLIC PANEL (CC13)
AST: 22 U/L (ref 5–34)
Anion Gap: 9 mEq/L (ref 3–11)
BUN: 30.4 mg/dL — ABNORMAL HIGH (ref 7.0–26.0)
CO2: 24 mEq/L (ref 22–29)
Calcium: 9.7 mg/dL (ref 8.4–10.4)
Chloride: 102 mEq/L (ref 98–109)
Creatinine: 1.1 mg/dL (ref 0.6–1.1)
Total Protein: 6.3 g/dL — ABNORMAL LOW (ref 6.4–8.3)

## 2013-02-11 MED ORDER — ONDANSETRON 8 MG/50ML IVPB (CHCC)
8.0000 mg | Freq: Once | INTRAVENOUS | Status: AC
  Administered 2013-02-11: 8 mg via INTRAVENOUS

## 2013-02-11 MED ORDER — PACLITAXEL PROTEIN-BOUND CHEMO INJECTION 100 MG
100.0000 mg/m2 | Freq: Once | INTRAVENOUS | Status: AC
  Administered 2013-02-11: 175 mg via INTRAVENOUS
  Filled 2013-02-11: qty 35

## 2013-02-11 MED ORDER — HEPARIN SOD (PORK) LOCK FLUSH 100 UNIT/ML IV SOLN
500.0000 [IU] | Freq: Once | INTRAVENOUS | Status: AC | PRN
  Administered 2013-02-11: 500 [IU]
  Filled 2013-02-11: qty 5

## 2013-02-11 MED ORDER — DEXAMETHASONE SODIUM PHOSPHATE 10 MG/ML IJ SOLN
INTRAMUSCULAR | Status: AC
  Filled 2013-02-11: qty 1

## 2013-02-11 MED ORDER — INFLUENZA VAC SPLIT QUAD 0.5 ML IM SUSP
0.5000 mL | Freq: Once | INTRAMUSCULAR | Status: AC
  Administered 2013-02-11: 0.5 mL via INTRAMUSCULAR
  Filled 2013-02-11: qty 0.5

## 2013-02-11 MED ORDER — SODIUM CHLORIDE 0.9 % IV SOLN
Freq: Once | INTRAVENOUS | Status: AC
  Administered 2013-02-11: 16:00:00 via INTRAVENOUS

## 2013-02-11 MED ORDER — DEXAMETHASONE SODIUM PHOSPHATE 10 MG/ML IJ SOLN
4.0000 mg | Freq: Once | INTRAMUSCULAR | Status: AC
  Administered 2013-02-11: 4 mg via INTRAVENOUS

## 2013-02-11 MED ORDER — PNEUMOCOCCAL VAC POLYVALENT 25 MCG/0.5ML IJ INJ
0.5000 mL | INJECTION | Freq: Once | INTRAMUSCULAR | Status: AC
  Administered 2013-02-11: 0.5 mL via INTRAMUSCULAR
  Filled 2013-02-11: qty 0.5

## 2013-02-11 MED ORDER — SODIUM CHLORIDE 0.9 % IJ SOLN
10.0000 mL | INTRAMUSCULAR | Status: DC | PRN
  Administered 2013-02-11: 10 mL
  Filled 2013-02-11: qty 10

## 2013-02-11 MED ORDER — ONDANSETRON 8 MG/NS 50 ML IVPB
INTRAVENOUS | Status: AC
  Filled 2013-02-11: qty 8

## 2013-02-11 NOTE — Progress Notes (Signed)
ID: Regina West   DOB: [birth year]  MR#: [id number]  WUX#:[id number]  PCP: Minda Meo, MD GYN: SU: Ivar Drape, MD OTHER MD: Melvyn Novas, MD  CHIEF COMPLAINT:  Recurrent ovarian cancer  HISTORY OF PRESENT ILLNESS: The patient originally presented in the summer of 2010 with cramps and abdominal distension.  I do not have a copy of the initial evaluation, but on November 26, 2008, the patient underwent optimal debulking with bilateral salpingo-oophorectomy, omentectomy, and the placement of an intraperitoneal port.  The pathology from that procedure (Accession Number [id number] at [facility]) showed first of all, significant involvement of the omentum, minimal involvement of the right ovary and right fallopian tube and negative on the left ovary and fallopian tube, with neither ovary being enlarged, consistent with a primary peritoneal serous adenocarcinoma, described as moderately differentiated.  The sample included no lymph nodes.    The patient had an intraperitoneal port placed in the same surgery and was treated with intraperitoneal and IV chemotherapy according to GOG-252 but I am not sure which arm she was in.  (Arm 2 did carboplatin intraperitoneally and Taxol IV.  Arm 3 did cisplatin and paclitaxel intraperitoneally with paclitaxel IV.)  All arms received bevacizumab.  Unfortunately, after 4 cycles of treatment, she had acute mental status changes and was admitted here January of 2011 with what proved to be posterior reversible leukoencephalopathy and SIADH.  She had seizures, aphasia, and required intubation.  Once the patient recovered from this, she was treated with 2 cycles of single-agent carboplatin (I do not have the AUC). Her last adjuvant treatment was May 12, 2009, and her CA-125 at that time was 10.3.  Her intraperitoneal port was removed in April of 2011.    More recently, the patient was in routine follow up when her CA-125 was found to jump up to 2,269.7 (March 26, 2010).   This is 10 months after her last prior chemotherapy.  She had CTs of the chest, abdomen and pelvis January 26th which showed ascites and enhancing peritoneal nodularity. She had had similar findings at presentation but these had completely resolved by the time she finished treatment in March of 2011. The patient was felt to be in first relapse. Subsequent treatments are as summarized below   INTERVAL HISTORY:  Regina West returns today with her daughter for day 8 cycle 2  of Abraxane. She's currently receiving Abraxane on days one and 8 of each 21 day cycle with good tolerance thus far.   Regina West has started to lose her hair already. She tells me she has some good days and some bad days. She describes a bad day as a day she is increasingly fatigued, and "doesn't feel like doing much". She admits that she is not exercising as much as she was previously. She is trying to keep herself well hydrated and is trying to eat a good diet.  She drinks warm prune juice in the morning which has taken care of her constipation, and she is having regular bowel movements. She does note that she has some change in sensitivity of the hands bilaterally, and that her fine motor skills are not quite as "facile" as they were in the past.  Overall, she would say that the neuropathy is very mild and is not really affecting any of her day-to-day activities.  [name] is leaving on Christmas Eve to travel to [place] for the holiday.  She would like to have a flu vaccine and pneumonia booster today prior to the trip.  REVIEW OF SYSTEMS:  Regina West  has had no fevers, chills, or night sweats. She denies any skin changes or rashes. She's had no abnormal bruising or bleeding. She's had no problems with nausea or emesis. She has some shortness of breath with exertion which is stable, but denies any increased cough or phlegm production. She's had no chest pain or palpitations. She's had no abnormal headaches or dizziness. Currently, she denies  any unusual myalgias, arthralgias, or bony pain. She has some occasional discomfort in the abdomen which she attributes to distention.  Otherwise, detailed review of systems is stable and noncontributory.   PAST MEDICAL HISTORY: Significant for hysterectomy at the age of 48 with concurrent bladder "tuck up."  She also underwent appendectomy during that procedure.  She is status post prior bilateral cataract surgery. She is status post tonsillectomy and adenoidectomy.  She has a history of hypertension and diabetes. She has a history of diverticulosis.  She has a large hiatal hernia.  She has degenerative disk disease.  She has been noted to have coronary calcifications and she has hyperlipidemia.  There is a history of remote tobacco abuse. Seizure disorder as per HPI  FAMILY HISTORY The patient's father died at the age of 2 after heart surgery for aortic stenosis.  The patient's mother died at the age of 19.  The patient has one brother in good health.  There is no other family history to her knowledge of ovarian or breast cancer.    GYNECOLOGIC HISTORY: Menarche at age 79.  She is GX P3 with menopause in her late 7s.  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 She is currently residing in [place]. Her husband was in sales and had a history of Parkinson's disease.  He died following a fall in the year 2000.  The patient's son Regina West lives in [place], and has a history of MS.  Daughter Regina West lives in [place]. She is a Futures trader.  Son Regina West lives in [place], [place] and is in Airline pilot. The patient has 8 grandchildren. She attends the [employer].  She is a good friend of our patients [name]   ADVANCED DIRECTIVES: in place  HEALTH MAINTENANCE: History  Substance Use Topics  . Smoking status: Former Smoker    Quit date: 05/24/1959  . Smokeless tobacco:  Never Used  . Alcohol Use: No     Colonoscopy: 2008  PAP: UTD  MM: refuses   Allergies  Allergen Reactions  . Avastin [Bevacizumab] Other (See Comments)    Swelling of the brain     Current Outpatient Prescriptions  Medication Sig Dispense Refill  . acyclovir (ZOVIRAX) 400 MG tablet Take 1 tablet (400 mg total) by mouth 2 (two) times daily.  60 tablet  12  . antiseptic oral rinse (BIOTENE) LIQD 15 mLs by Mouth Rinse route 2 (two) times daily.  1 Bottle  0  . cholecalciferol (VITAMIN D) 1000 UNITS tablet Take 1,000 Units by mouth 2 (two) times daily.      Marland Kitchen levETIRAcetam (KEPPRA) 250 MG tablet Take 1 tablet (250 mg total) by mouth 2 (two) times daily.  60 tablet  0  . nitrofurantoin (MACRODANTIN) 50 MG capsule TAKE TWO CAPSULES BY MOUTH ONCE DAILY  60 capsule  3  . omeprazole (PRILOSEC) 40 MG capsule Take 1 capsule (40 mg total) by mouth at bedtime.  30  capsule  4  . VIIBRYD 40 MG TABS       . ondansetron (ZOFRAN) 8 MG tablet Take 1 tablet (8 mg total) by mouth 2 (two) times daily. Take two times a day starting the day after chemo for 2 days. Then take two times a day as needed for nausea or vomiting.  30 tablet  1  . prochlorperazine (COMPAZINE) 10 MG tablet Take 1 tablet (10 mg total) by mouth every 6 (six) hours as needed (Nausea or vomiting).  30 tablet  1   No current facility-administered medications for this visit.   Facility-Administered Medications Ordered in Other Visits  Medication Dose Route Frequency Provider Last Rate Last Dose  . sodium chloride 0.9 % injection 10 mL  10 mL Intracatheter PRN Sheela Mcculley Allegra Grana, PA-C   10 mL at 02/11/13 1728    OBJECTIVE: Elderly white woman who appears tired but is in no acute distress  Filed Vitals:   02/11/13 1358  BP: 125/73  Pulse: 88  Temp: 98.6 F (37 C)  Resp: 18     Body mass index is 27.4 kg/(m^2).    ECOG FS: 1 Filed Weights   02/11/13 1358  Weight: 169 lb 11.2 oz (76.975 kg)   Physical Exam: HEENT:  Sclerae anicteric.   Oropharynx clear and moist. No ulcerations and no evidence of candidiasis. Early signs of alopecia are noted today. NODES:  No cervical, supraclavicular, or axillary lymphadenopathy palpated.  BREAST EXAM:  Deferred. LUNGS:  Clear to auscultation bilaterally.  No wheezes or rhonchi HEART:  Regular rate and rhythm.  ABDOMEN:  Soft, mildly distended, nontender to palpation. No obvious ascites or masses palpated. Positive bowel sounds.  MSK:  No focal spinal tenderness to palpation. Full range of motion in the upper extremities. EXTREMITIES:  No peripheral edema.   SKIN:  No rashes or skin changes noted. NEURO:  Nonfocal. Well oriented.  Appropriate affect.     LAB RESULTS: CA 125    93.8  02/04/2013    200.6  01/23/2013    271.0  01/11/2013    245.0  01/07/2013     18.7  11/19/2012    Lab Results  Component Value Date   WBC 3.8* 02/11/2013   NEUTROABS 2.5 02/11/2013   HGB 10.3* 02/11/2013   HCT 30.9* 02/11/2013   MCV 99.7 02/11/2013   PLT 159 02/11/2013      Chemistry      Component Value Date/Time   NA 136 02/11/2013 1318   NA 136 10/17/2011 1118   NA 143 05/12/2011 1337   K 4.6 02/11/2013 1318   K 3.9 10/17/2011 1118   K 4.1 05/12/2011 1337   CL 100 08/20/2012 1124   CL 100 10/17/2011 1118   CL 92* 05/12/2011 1337   CO2 24 02/11/2013 1318   CO2 27 10/17/2011 1118   CO2 32 05/12/2011 1337   BUN 30.4* 02/11/2013 1318   BUN 25* 10/17/2011 1118   BUN 19 05/12/2011 1337   CREATININE 1.1 02/11/2013 1318   CREATININE 1.10 10/17/2011 1118   CREATININE 1.3* 05/12/2011 1337      Component Value Date/Time   CALCIUM 9.7 02/11/2013 1318   CALCIUM 9.8 10/17/2011 1118   CALCIUM 9.8 05/12/2011 1337   ALKPHOS 103 02/11/2013 1318   ALKPHOS 109 10/17/2011 1118   ALKPHOS 95* 05/12/2011 1337   AST 22 02/11/2013 1318   AST 22 10/17/2011 1118   AST 40* 05/12/2011 1337   ALT 24 02/11/2013 1318   ALT  14 10/17/2011 1118   ALT 28 05/12/2011 1337   BILITOT 0.35 02/11/2013 1318   BILITOT 0.4 10/17/2011 1118   BILITOT 0.60  05/12/2011 1337      STUDIES: No results found.   ASSESSMENT: 79 y.o.  [place] woman   (1) status post optimal debulking of a primary peritoneal serous adenocarcinoma September 2010, the tumor being moderately differentiated, pT3c NX (stage IIIC), treated according to GOG 252 with  intraperitoneal platinum and paclitaxel x4 given along with bevacizumab, complicated by SIADHafter cycle 4, also with posterior reversible leukoencephalopathy.  After these problems resolved she received 2 additional cycles of single-agent carboplatin completed in March 2011  (2)  She had her first recurrence February 2012  treated with carboplatin and Gemzar for a total of 6 cycles between February and June 2012.  Her CEA-125 had normalized before the beginning of cycle 5.   (3) second recurrence documented February 2013,  treated with carboplatin/ doxil x 6, completed 10/03/2011, with a good response noted on restaging scans  (4) third recurrence documented February 2014, receiving single-agent carboplatin every 3 weeks, first dose 04/30/2012, completed 08/20/2012  (5) fourth recurrence documented by abdominal symptoms and a rising CA 125 November 2014, receiving Abraxane on days one and 8 of each 21 day cycle, with first dose given on 01/16/2013.  PLAN:  Makinley  continues to tolerate the Abraxane well and will proceed to treatment today as scheduled for day 8 cycle 2. Fortunately, her ANC recovered nicely over the past week. If we start to see it decrease consistently, we may need to consider going to a every 2 week schedule of Abraxane, or give Neupogen injections if necessary. We are already seeing a good decrease in her CA 125 which we will continue to follow closely. We will also follow her very closely for any increased signs of peripheral neuropathy.    She will receive a flu shot and pneumonia vaccine today per her request. While she will likely get very little benefit from these vaccines, they certainly  will not harm her.  Graham has a good understanding of all this and she is very much in agreement with this plan. I will see her again in 2 weeks on December 22 in anticipation of day 1 cycle 3. She knows to call prior that time with any changes or problems.    Larna Capelle PA-C     02/11/2013

## 2013-02-11 NOTE — Telephone Encounter (Signed)
Per staff message and POF I have scheduled appts.  JMW  

## 2013-02-11 NOTE — Telephone Encounter (Signed)
appts made and printed. Pt is aware that tx will be added. i emailed MW to add the tx's...td 

## 2013-02-12 ENCOUNTER — Encounter: Payer: Self-pay | Admitting: Oncology

## 2013-02-12 ENCOUNTER — Ambulatory Visit: Payer: Medicare Other

## 2013-02-15 ENCOUNTER — Other Ambulatory Visit: Payer: Self-pay | Admitting: *Deleted

## 2013-02-15 DIAGNOSIS — K219 Gastro-esophageal reflux disease without esophagitis: Secondary | ICD-10-CM

## 2013-02-15 DIAGNOSIS — C569 Malignant neoplasm of unspecified ovary: Secondary | ICD-10-CM

## 2013-02-15 MED ORDER — OMEPRAZOLE 40 MG PO CPDR: 40.0000 mg | DELAYED_RELEASE_CAPSULE | Freq: Every day | ORAL | Status: DC

## 2013-02-15 NOTE — Progress Notes (Signed)
Dear Mrs. Roseanne Reno,   I received a visit note from your oncologist , and recent lab results. Please let me know if I can be of help at this time.  Your Sodium seems well controlled.   Sincerely,   Tami Barren, MD 02-05-13

## 2013-02-19 ENCOUNTER — Telehealth: Payer: Self-pay | Admitting: *Deleted

## 2013-02-19 NOTE — Telephone Encounter (Signed)
Daughter [name] calling to inform us that patient WILL be able to go out of town for Christmas and is requesting all her appts be moved out by 1 week. Patient will need tx dates to be 12/29, 1/5,(Day1, 8) then skip a week, and will need to restart C4 on 1/19 and 1/26. Will send orders to scheduling to make these changes.

## 2013-02-19 NOTE — Telephone Encounter (Signed)
Per staff message and POF I have scheduled appts.  JMW  

## 2013-02-21 ENCOUNTER — Telehealth: Payer: Self-pay | Admitting: Oncology

## 2013-02-25 ENCOUNTER — Telehealth: Payer: Self-pay | Admitting: *Deleted

## 2013-02-25 ENCOUNTER — Ambulatory Visit: Payer: Medicare Other

## 2013-02-25 ENCOUNTER — Other Ambulatory Visit: Payer: Medicare Other

## 2013-02-25 ENCOUNTER — Ambulatory Visit: Payer: Medicare Other | Admitting: Physician Assistant

## 2013-02-25 NOTE — Telephone Encounter (Signed)
[phone] Nickola Major call for appts

## 2013-03-04 ENCOUNTER — Telehealth: Payer: Self-pay | Admitting: Physician Assistant

## 2013-03-04 ENCOUNTER — Encounter: Payer: Self-pay | Admitting: Physician Assistant

## 2013-03-04 ENCOUNTER — Ambulatory Visit (HOSPITAL_BASED_OUTPATIENT_CLINIC_OR_DEPARTMENT_OTHER): Payer: Medicare Other

## 2013-03-04 ENCOUNTER — Other Ambulatory Visit (HOSPITAL_BASED_OUTPATIENT_CLINIC_OR_DEPARTMENT_OTHER): Payer: Medicare Other

## 2013-03-04 ENCOUNTER — Other Ambulatory Visit: Payer: Self-pay | Admitting: Neurology

## 2013-03-04 ENCOUNTER — Ambulatory Visit (HOSPITAL_BASED_OUTPATIENT_CLINIC_OR_DEPARTMENT_OTHER): Payer: Medicare Other | Admitting: Physician Assistant

## 2013-03-04 VITALS — BP 139/81 | HR 84 | Temp 97.8°F | Resp 20 | Ht 66.0 in | Wt 170.9 lb

## 2013-03-04 DIAGNOSIS — C482 Malignant neoplasm of peritoneum, unspecified: Secondary | ICD-10-CM

## 2013-03-04 DIAGNOSIS — D649 Anemia, unspecified: Secondary | ICD-10-CM

## 2013-03-04 DIAGNOSIS — Z5111 Encounter for antineoplastic chemotherapy: Secondary | ICD-10-CM

## 2013-03-04 DIAGNOSIS — C801 Malignant (primary) neoplasm, unspecified: Secondary | ICD-10-CM

## 2013-03-04 DIAGNOSIS — R5381 Other malaise: Secondary | ICD-10-CM

## 2013-03-04 DIAGNOSIS — R300 Dysuria: Secondary | ICD-10-CM

## 2013-03-04 LAB — CBC WITH DIFFERENTIAL/PLATELET
Basophils Absolute: 0 10*3/uL (ref 0.0–0.1)
EOS%: 2.7 % (ref 0.0–7.0)
HCT: 32 % — ABNORMAL LOW (ref 34.8–46.6)
HGB: 10.8 g/dL — ABNORMAL LOW (ref 11.6–15.9)
LYMPH%: 11.9 % — ABNORMAL LOW (ref 14.0–49.7)
MCH: 33.3 pg (ref 25.1–34.0)
MCV: 98.8 fL (ref 79.5–101.0)
MONO#: 0.9 10*3/uL (ref 0.1–0.9)
MONO%: 13.9 % (ref 0.0–14.0)
NEUT%: 71.3 % (ref 38.4–76.8)
Platelets: 175 10*3/uL (ref 145–400)
RBC: 3.24 10*6/uL — ABNORMAL LOW (ref 3.70–5.45)

## 2013-03-04 LAB — COMPREHENSIVE METABOLIC PANEL (CC13)
ALT: 20 U/L (ref 0–55)
AST: 22 U/L (ref 5–34)
Albumin: 3.7 g/dL (ref 3.5–5.0)
Alkaline Phosphatase: 106 U/L (ref 40–150)
Anion Gap: 10 mEq/L (ref 3–11)
BUN: 18.6 mg/dL (ref 7.0–26.0)
CO2: 25 mEq/L (ref 22–29)
Calcium: 9.9 mg/dL (ref 8.4–10.4)
Chloride: 98 mEq/L (ref 98–109)
Creatinine: 1.1 mg/dL (ref 0.6–1.1)
Glucose: 152 mg/dl — ABNORMAL HIGH (ref 70–140)
Potassium: 4 mEq/L (ref 3.5–5.1)
Sodium: 133 mEq/L — ABNORMAL LOW (ref 136–145)
Total Bilirubin: 0.5 mg/dL (ref 0.20–1.20)
Total Protein: 6.5 g/dL (ref 6.4–8.3)

## 2013-03-04 LAB — URINALYSIS, MICROSCOPIC - CHCC
Bilirubin (Urine): NEGATIVE
Ketones: NEGATIVE mg/dL
Protein: NEGATIVE mg/dL
RBC / HPF: NEGATIVE (ref 0–2)
Specific Gravity, Urine: 1.005 (ref 1.003–1.035)
pH: 6 (ref 4.6–8.0)

## 2013-03-04 MED ORDER — DEXAMETHASONE SODIUM PHOSPHATE 10 MG/ML IJ SOLN
4.0000 mg | Freq: Once | INTRAMUSCULAR | Status: AC
  Administered 2013-03-04: 4 mg via INTRAVENOUS

## 2013-03-04 MED ORDER — HEPARIN SOD (PORK) LOCK FLUSH 100 UNIT/ML IV SOLN
500.0000 [IU] | Freq: Once | INTRAVENOUS | Status: AC | PRN
  Administered 2013-03-04: 500 [IU]
  Filled 2013-03-04: qty 5

## 2013-03-04 MED ORDER — ONDANSETRON 8 MG/NS 50 ML IVPB
INTRAVENOUS | Status: AC
  Filled 2013-03-04: qty 8

## 2013-03-04 MED ORDER — SODIUM CHLORIDE 0.9 % IJ SOLN
10.0000 mL | INTRAMUSCULAR | Status: DC | PRN
  Administered 2013-03-04: 10 mL
  Filled 2013-03-04: qty 10

## 2013-03-04 MED ORDER — PACLITAXEL PROTEIN-BOUND CHEMO INJECTION 100 MG
100.0000 mg/m2 | Freq: Once | INTRAVENOUS | Status: AC
  Administered 2013-03-04: 175 mg via INTRAVENOUS
  Filled 2013-03-04: qty 35

## 2013-03-04 MED ORDER — ONDANSETRON 8 MG/50ML IVPB (CHCC)
8.0000 mg | Freq: Once | INTRAVENOUS | Status: AC
  Administered 2013-03-04: 8 mg via INTRAVENOUS

## 2013-03-04 MED ORDER — SODIUM CHLORIDE 0.9 % IV SOLN
Freq: Once | INTRAVENOUS | Status: AC
  Administered 2013-03-04: 15:00:00 via INTRAVENOUS

## 2013-03-04 MED ORDER — DEXAMETHASONE SODIUM PHOSPHATE 10 MG/ML IJ SOLN
INTRAMUSCULAR | Status: AC
  Filled 2013-03-04: qty 1

## 2013-03-04 NOTE — Progress Notes (Signed)
ID: Regina West   DOB: [birth year]  MR#: [id number]  ONG#:[id number]  PCP: Minda Meo, MD GYN: SU: Ivar Drape, MD OTHER MD: Melvyn Novas, MD  CHIEF COMPLAINT:  Recurrent ovarian cancer  HISTORY OF PRESENT ILLNESS: The patient originally presented in the summer of 2010 with cramps and abdominal distension.  I do not have a copy of the initial evaluation, but on November 26, 2008, the patient underwent optimal debulking with bilateral salpingo-oophorectomy, omentectomy, and the placement of an intraperitoneal port.  The pathology from that procedure (Accession Number [id number] at [facility]) showed first of all, significant involvement of the omentum, minimal involvement of the right ovary and right fallopian tube and negative on the left ovary and fallopian tube, with neither ovary being enlarged, consistent with a primary peritoneal serous adenocarcinoma, described as moderately differentiated.  The sample included no lymph nodes.    The patient had an intraperitoneal port placed in the same surgery and was treated with intraperitoneal and IV chemotherapy according to GOG-252 but I am not sure which arm she was in.  (Arm 2 did carboplatin intraperitoneally and Taxol IV.  Arm 3 did cisplatin and paclitaxel intraperitoneally with paclitaxel IV.)  All arms received bevacizumab.  Unfortunately, after 4 cycles of treatment, she had acute mental status changes and was admitted here January of 2011 with what proved to be posterior reversible leukoencephalopathy and SIADH.  She had seizures, aphasia, and required intubation.  Once the patient recovered from this, she was treated with 2 cycles of single-agent carboplatin (I do not have the AUC). Her last adjuvant treatment was May 12, 2009, and her CA-125 at that time was 10.3.  Her intraperitoneal port was removed in April of 2011.    More recently, the patient was in routine follow up when her CA-125 was found to jump up to 2,269.7 (March 26, 2010).   This is 10 months after her last prior chemotherapy.  She had CTs of the chest, abdomen and pelvis January 26th which showed ascites and enhancing peritoneal nodularity. She had had similar findings at presentation but these had completely resolved by the time she finished treatment in March of 2011. The patient was felt to be in first relapse.  Subsequent treatments are as summarized below   INTERVAL HISTORY:  Regina West returns today with her daughter for day 1 cycle 3 of Abraxane. She's currently receiving Abraxane on days one and 8 of each 21 day cycle with good tolerance thus far. Treatment was held one week ago due to the Christmas holidays, and the fact that Regina West was out of town visiting family. (Although she enjoyed the trip overall, she tells me the flight was "horrible", and they lost her luggage on the way home!)  [name] continues to feel very tired, and that continues to be her biggest complaint and concern. She tells me that most days she "just doesn't feel good".  She admits that she did not keep her drink appropriately during her flight yesterday and became dehydrated. She has tried to hydrate herself since returning home, but has noticed a decrease in her stream of urine, with some mild dysuria and pelvic discomfort. She denies any hematuria.  [name] continues to have some slight decrease in sensation in her fingertips. She thinks this has worsened somewhat since beginning the Abraxane, but has remained stable for the last several weeks. It does not appear to be increasing significantly with each cycle. This is not affecting any of her day-to-day activities.  REVIEW OF SYSTEMS:  Regina West  has had no fevers, chills, or night sweats. She denies any skin changes or rashes, and has had no abnormal bruising or bleeding. Her appetite is fair. She's had no problems with nausea or emesis and denies any change in bowel habits.She still has some shortness of breath with exertion which is stable, but she  denies any increased cough or phlegm production. She's had no chest pain or palpitations. She's had no abnormal headaches or dizziness. Currently, she denies any unusual myalgias, arthralgias, or bony pain, and has had no peripheral swelling.   Otherwise, detailed review of systems is stable and noncontributory.   PAST MEDICAL HISTORY: Significant for hysterectomy at the age of 13 with concurrent bladder "tuck up."  She also underwent appendectomy during that procedure.  She is status post prior bilateral cataract surgery. She is status post tonsillectomy and adenoidectomy.  She has a history of hypertension and diabetes. She has a history of diverticulosis.  She has a large hiatal hernia.  She has degenerative disk disease.  She has been noted to have coronary calcifications and she has hyperlipidemia.  There is a history of remote tobacco abuse. Seizure disorder as per HPI  FAMILY HISTORY The patient's father died at the age of 62 after heart surgery for aortic stenosis.  The patient's mother died at the age of 78.  The patient has one brother in good health.  There is no other family history to her knowledge of ovarian or breast cancer.    GYNECOLOGIC HISTORY: Menarche at age 79.  She is GX P3 with menopause in her late 77s.  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 She is currently residing in [place]. Her husband was in sales and had a history of Parkinson's disease.  He died following a fall in the year 2000.  The patient's son Regina West lives in [place], and has a history of MS.  Daughter Regina West lives in [place]. She is a Futures trader.  Son Regina West lives in [place], [place] and is in Airline pilot. The patient has 8 grandchildren. She attends the [employer].  She is a good friend of our patients [name]   ADVANCED DIRECTIVES: in  place  HEALTH MAINTENANCE:  (Updated December 2014) History  Substance Use Topics  . Smoking status: Former Smoker    Quit date: 05/24/1959  . Smokeless tobacco: Never Used  . Alcohol Use: No     Colonoscopy: 2008  PAP: UTD  MM: refuses   Allergies  Allergen Reactions  . Avastin [Bevacizumab] Other (See Comments)    Swelling of the brain     Current Outpatient Prescriptions  Medication Sig Dispense Refill  . acyclovir (ZOVIRAX) 400 MG tablet Take 1 tablet (400 mg total) by mouth 2 (two) times daily.  60 tablet  12  . antiseptic oral rinse (BIOTENE) LIQD 15 mLs by Mouth Rinse route 2 (two) times daily.  1 Bottle  0  . cholecalciferol (VITAMIN D) 1000 UNITS tablet Take 1,000 Units by mouth 2 (two) times daily.      Marland Kitchen omeprazole (PRILOSEC) 40 MG capsule Take 1 capsule (40 mg total) by mouth at bedtime.  30 capsule  3  . VIIBRYD 40 MG TABS       . levETIRAcetam (KEPPRA) 250 MG tablet Take 1 tablet (250 mg total) by mouth 2 (two) times daily.  60 tablet  0  . nitrofurantoin (MACRODANTIN) 50 MG capsule TAKE TWO CAPSULES BY MOUTH ONCE DAILY  60 capsule  3  . ondansetron (ZOFRAN) 8 MG tablet Take 1 tablet (8 mg total) by mouth 2 (two) times daily. Take two times a day starting the day after chemo for 2 days. Then take two times a day as needed for nausea or vomiting.  30 tablet  1  . prochlorperazine (COMPAZINE) 10 MG tablet Take 1 tablet (10 mg total) by mouth every 6 (six) hours as needed (Nausea or vomiting).  30 tablet  1   No current facility-administered medications for this visit.   Facility-Administered Medications Ordered in Other Visits  Medication Dose Route Frequency Provider Last Rate Last Dose  . heparin lock flush 100 unit/mL  500 Units Intracatheter Once PRN Ariya Bohannon Allegra Grana, PA-C      . PACLitaxel-protein bound (ABRAXANE) chemo infusion 175 mg  100 mg/m2 (Treatment Plan Actual) Intravenous Once Catalina Gravel, PA-C 70 mL/hr at 03/04/13 1556 175 mg at 03/04/13 1556  . sodium  chloride 0.9 % injection 10 mL  10 mL Intracatheter PRN Jahree Dermody Allegra Grana, PA-C        OBJECTIVE: Elderly white woman who appears tired but is in no acute distress  Filed Vitals:   03/04/13 1323  BP: 139/81  Pulse: 84  Temp: 97.8 F (36.6 C)  Resp: 20     Body mass index is 27.6 kg/(m^2).    ECOG FS: 1 Filed Weights   03/04/13 1323  Weight: 170 lb 14.4 oz (77.52 kg)   Physical Exam: HEENT:  Sclerae anicteric.  Oropharynx clear and moist with no evidence of ulcerations or oropharyngeal candidiasis. Neck is supple. NODES:  No cervical, supraclavicular, or axillary lymphadenopathy palpated.  BREAST EXAM:  Deferred. LUNGS:  Clear to auscultation bilaterally, with fair excursion. Breath sounds are slightly decreased bilaterally in the bases..  No wheezes or rhonchi HEART:  Regular rate and rhythm.  ABDOMEN:  Soft, mildly distended, nontender to palpation. No obvious ascites or masses palpated. Positive, normoactive bowel sounds.  MSK:  No focal spinal tenderness to palpation. Full range of motion in the upper extremities. EXTREMITIES:  No peripheral edema.   SKIN:  No rashes or skin changes noted. No nailbed changes. Alopecia is evident today. NEURO:  Nonfocal. Well oriented.  Appropriate affect.     LAB RESULTS: CA 125    93.8  02/04/2013    200.6  01/23/2013    271.0  01/11/2013    245.0  01/07/2013     18.7  11/19/2012    Lab Results  Component Value Date   WBC 6.4 03/04/2013   NEUTROABS 4.6 03/04/2013   HGB 10.8* 03/04/2013   HCT 32.0* 03/04/2013   MCV 98.8 03/04/2013   PLT 175 03/04/2013      Chemistry      Component Value Date/Time   NA 133* 03/04/2013 1225   NA 136 10/17/2011 1118   NA 143 05/12/2011 1337   K 4.0 03/04/2013 1225   K 3.9 10/17/2011 1118   K 4.1 05/12/2011 1337   CL 100 08/20/2012 1124   CL 100 10/17/2011 1118   CL 92* 05/12/2011 1337   CO2 25 03/04/2013 1225   CO2 27 10/17/2011 1118   CO2 32 05/12/2011 1337   BUN 18.6 03/04/2013 1225   BUN 25*  10/17/2011 1118   BUN 19 05/12/2011 1337   CREATININE 1.1 03/04/2013 1225   CREATININE 1.10 10/17/2011 1118  CREATININE 1.3* 05/12/2011 1337      Component Value Date/Time   CALCIUM 9.9 03/04/2013 1225   CALCIUM 9.8 10/17/2011 1118   CALCIUM 9.8 05/12/2011 1337   ALKPHOS 106 03/04/2013 1225   ALKPHOS 109 10/17/2011 1118   ALKPHOS 95* 05/12/2011 1337   AST 22 03/04/2013 1225   AST 22 10/17/2011 1118   AST 40* 05/12/2011 1337   ALT 20 03/04/2013 1225   ALT 14 10/17/2011 1118   ALT 28 05/12/2011 1337   BILITOT 0.50 03/04/2013 1225   BILITOT 0.4 10/17/2011 1118   BILITOT 0.60 05/12/2011 1337      STUDIES: No results found.   ASSESSMENT: 79 y.o.  [place] woman   (1) status post optimal debulking of a primary peritoneal serous adenocarcinoma September 2010, the tumor being moderately differentiated, pT3c NX (stage IIIC), treated according to GOG 252 with  intraperitoneal platinum and paclitaxel x4 given along with bevacizumab, complicated by SIADHafter cycle 4, also with posterior reversible leukoencephalopathy.  After these problems resolved she received 2 additional cycles of single-agent carboplatin completed in March 2011  (2)  She had her first recurrence February 2012  treated with carboplatin and Gemzar for a total of 6 cycles between February and June 2012.  Her CEA-125 had normalized before the beginning of cycle 5.   (3) second recurrence documented February 2013,  treated with carboplatin/ doxil x 6, completed 10/03/2011, with a good response noted on restaging scans  (4) third recurrence documented February 2014, receiving single-agent carboplatin every 3 weeks, first dose 04/30/2012, completed 08/20/2012  (5) fourth recurrence documented by abdominal symptoms and a rising CA 125 November 2014, receiving Abraxane on days one and 8 of each 21 day cycle, with first dose given on 01/16/2013.  PLAN:  Merilyn  continues to tolerate the Abraxane well overall and will proceed to treatment  today as scheduled for day 1 cycle 3. We will continue to follow her very closely, especially for increased fatigue, or any signs of increased neuropathy.   I will plan on seeing her again in one week to assess whether or not she is up to receiving day 8 cycle 3 as scheduled on 03/11/2012. She feels like we may need to change to a every 2 week schedule rather than treating 2 weeks on and one-week off, and we will continue to evaluate her tolerance with each cycle.  She has had some problems with neutropenia in the past, and if that's begins to be a consistent problem, we will also consider giving Neupogen injections as needed.   We will obtain a urinalysis and urine culture to evaluate for a possible urinary tract infection. I have also encouraged Cricket to keep herself very well hydrated, and to contact us if she has any increased dysuria, hematuria, urinary hesitancy, decreased urinary stream, or pelvic pain.  Kathyann has a good understanding of the above and she is very much in agreement with this plan. She knows to call prior that time with any changes or problems.    Jaylon Grode PA-C     03/04/2013

## 2013-03-04 NOTE — Patient Instructions (Signed)
Cancer Center Discharge Instructions for Patients Receiving Chemotherapy  Today you received the following chemotherapy agents Abraxane  To help prevent nausea and vomiting after your treatment, we encourage you to take your nausea medication as prescribed.   If you develop nausea and vomiting that is not controlled by your nausea medication, call the clinic.   BELOW ARE SYMPTOMS THAT SHOULD BE REPORTED IMMEDIATELY:  *FEVER GREATER THAN 100.5 F  *CHILLS WITH OR WITHOUT FEVER  NAUSEA AND VOMITING THAT IS NOT CONTROLLED WITH YOUR NAUSEA MEDICATION  *UNUSUAL SHORTNESS OF BREATH  *UNUSUAL BRUISING OR BLEEDING  TENDERNESS IN MOUTH AND THROAT WITH OR WITHOUT PRESENCE OF ULCERS  *URINARY PROBLEMS  *BOWEL PROBLEMS  UNUSUAL RASH Items with * indicate a potential emergency and should be followed up as soon as possible.  Feel free to call the clinic you have any questions or concerns. The clinic phone number is [phone].    

## 2013-03-05 ENCOUNTER — Other Ambulatory Visit: Payer: Self-pay | Admitting: *Deleted

## 2013-03-05 ENCOUNTER — Ambulatory Visit: Payer: Medicare Other

## 2013-03-05 ENCOUNTER — Other Ambulatory Visit: Payer: Self-pay | Admitting: Neurology

## 2013-03-05 ENCOUNTER — Encounter: Payer: Self-pay | Admitting: Oncology

## 2013-03-05 DIAGNOSIS — C569 Malignant neoplasm of unspecified ovary: Secondary | ICD-10-CM

## 2013-03-05 LAB — URINE CULTURE

## 2013-03-05 MED ORDER — NITROFURANTOIN MACROCRYSTAL 50 MG PO CAPS: ORAL_CAPSULE | ORAL | Status: DC

## 2013-03-11 ENCOUNTER — Other Ambulatory Visit: Payer: Self-pay | Admitting: Oncology

## 2013-03-11 ENCOUNTER — Encounter: Payer: Self-pay | Admitting: Physician Assistant

## 2013-03-11 ENCOUNTER — Telehealth: Payer: Self-pay | Admitting: Neurology

## 2013-03-11 ENCOUNTER — Other Ambulatory Visit (HOSPITAL_BASED_OUTPATIENT_CLINIC_OR_DEPARTMENT_OTHER): Payer: Medicare Other

## 2013-03-11 ENCOUNTER — Telehealth: Payer: Self-pay | Admitting: *Deleted

## 2013-03-11 ENCOUNTER — Ambulatory Visit (HOSPITAL_BASED_OUTPATIENT_CLINIC_OR_DEPARTMENT_OTHER): Payer: Medicare Other | Admitting: Physician Assistant

## 2013-03-11 ENCOUNTER — Other Ambulatory Visit: Payer: Medicare Other

## 2013-03-11 ENCOUNTER — Ambulatory Visit (HOSPITAL_BASED_OUTPATIENT_CLINIC_OR_DEPARTMENT_OTHER): Payer: Medicare Other

## 2013-03-11 VITALS — BP 133/73 | HR 83 | Temp 98.4°F | Resp 18 | Ht 66.0 in | Wt 169.3 lb

## 2013-03-11 DIAGNOSIS — D649 Anemia, unspecified: Secondary | ICD-10-CM

## 2013-03-11 DIAGNOSIS — C482 Malignant neoplasm of peritoneum, unspecified: Secondary | ICD-10-CM

## 2013-03-11 DIAGNOSIS — C569 Malignant neoplasm of unspecified ovary: Secondary | ICD-10-CM

## 2013-03-11 DIAGNOSIS — Z5111 Encounter for antineoplastic chemotherapy: Secondary | ICD-10-CM

## 2013-03-11 DIAGNOSIS — R5383 Other fatigue: Secondary | ICD-10-CM

## 2013-03-11 DIAGNOSIS — R0989 Other specified symptoms and signs involving the circulatory and respiratory systems: Secondary | ICD-10-CM

## 2013-03-11 DIAGNOSIS — R5381 Other malaise: Secondary | ICD-10-CM

## 2013-03-11 DIAGNOSIS — R0609 Other forms of dyspnea: Secondary | ICD-10-CM

## 2013-03-11 DIAGNOSIS — G609 Hereditary and idiopathic neuropathy, unspecified: Secondary | ICD-10-CM

## 2013-03-11 LAB — CBC WITH DIFFERENTIAL/PLATELET
BASO%: 0.5 % (ref 0.0–2.0)
Basophils Absolute: 0 10*3/uL (ref 0.0–0.1)
EOS%: 4.7 % (ref 0.0–7.0)
Eosinophils Absolute: 0.2 10*3/uL (ref 0.0–0.5)
HCT: 30.1 % — ABNORMAL LOW (ref 34.8–46.6)
HGB: 10 g/dL — ABNORMAL LOW (ref 11.6–15.9)
LYMPH%: 18.9 % (ref 14.0–49.7)
MCH: 33.2 pg (ref 25.1–34.0)
MCHC: 33.2 g/dL (ref 31.5–36.0)
MCV: 100 fL (ref 79.5–101.0)
MONO#: 0.2 10*3/uL (ref 0.1–0.9)
MONO%: 4.4 % (ref 0.0–14.0)
NEUT#: 3.1 10*3/uL (ref 1.5–6.5)
NEUT%: 71.5 % (ref 38.4–76.8)
NRBC: 0 % (ref 0–0)
PLATELETS: 149 10*3/uL (ref 145–400)
RBC: 3.01 10*6/uL — AB (ref 3.70–5.45)
RDW: 14.8 % — ABNORMAL HIGH (ref 11.2–14.5)
WBC: 4.3 10*3/uL (ref 3.9–10.3)
lymph#: 0.8 10*3/uL — ABNORMAL LOW (ref 0.9–3.3)

## 2013-03-11 LAB — COMPREHENSIVE METABOLIC PANEL (CC13)
ALT: 20 U/L (ref 0–55)
AST: 19 U/L (ref 5–34)
Albumin: 3.7 g/dL (ref 3.5–5.0)
Alkaline Phosphatase: 98 U/L (ref 40–150)
Anion Gap: 12 mEq/L — ABNORMAL HIGH (ref 3–11)
BILIRUBIN TOTAL: 0.42 mg/dL (ref 0.20–1.20)
BUN: 21.8 mg/dL (ref 7.0–26.0)
CO2: 23 mEq/L (ref 22–29)
CREATININE: 1 mg/dL (ref 0.6–1.1)
Calcium: 9.6 mg/dL (ref 8.4–10.4)
Chloride: 100 mEq/L (ref 98–109)
Glucose: 147 mg/dl — ABNORMAL HIGH (ref 70–140)
Potassium: 4.4 mEq/L (ref 3.5–5.1)
Sodium: 135 mEq/L — ABNORMAL LOW (ref 136–145)
Total Protein: 6.5 g/dL (ref 6.4–8.3)

## 2013-03-11 MED ORDER — ONDANSETRON 8 MG/NS 50 ML IVPB
INTRAVENOUS | Status: AC
  Filled 2013-03-11: qty 8

## 2013-03-11 MED ORDER — DEXAMETHASONE SODIUM PHOSPHATE 10 MG/ML IJ SOLN
INTRAMUSCULAR | Status: AC
  Filled 2013-03-11: qty 1

## 2013-03-11 MED ORDER — DEXAMETHASONE SODIUM PHOSPHATE 10 MG/ML IJ SOLN
4.0000 mg | Freq: Once | INTRAMUSCULAR | Status: AC
  Administered 2013-03-11: 4 mg via INTRAVENOUS

## 2013-03-11 MED ORDER — PACLITAXEL PROTEIN-BOUND CHEMO INJECTION 100 MG
100.0000 mg/m2 | Freq: Once | INTRAVENOUS | Status: AC
Start: 2013-03-11 — End: 2013-03-11
  Administered 2013-03-11: 175 mg via INTRAVENOUS
  Filled 2013-03-11: qty 35

## 2013-03-11 MED ORDER — SODIUM CHLORIDE 0.9 % IV SOLN
Freq: Once | INTRAVENOUS | Status: AC
  Administered 2013-03-11: 15:00:00 via INTRAVENOUS

## 2013-03-11 MED ORDER — ONDANSETRON 8 MG/50ML IVPB (CHCC)
8.0000 mg | Freq: Once | INTRAVENOUS | Status: AC
  Administered 2013-03-11: 8 mg via INTRAVENOUS

## 2013-03-11 MED ORDER — SODIUM CHLORIDE 0.9 % IJ SOLN
10.0000 mL | INTRAMUSCULAR | Status: DC | PRN
  Administered 2013-03-11: 10 mL
  Filled 2013-03-11: qty 10

## 2013-03-11 MED ORDER — HEPARIN SOD (PORK) LOCK FLUSH 100 UNIT/ML IV SOLN
500.0000 [IU] | Freq: Once | INTRAVENOUS | Status: AC | PRN
  Administered 2013-03-11: 500 [IU]
  Filled 2013-03-11: qty 5

## 2013-03-11 NOTE — Progress Notes (Signed)
ID: Regina West   DOB: 01/15/1930  MR#: [id number]  [id number]  PCP: [name], MD GYN: SU: Stephan Minister, MD OTHER MD: Larey Seat, MD  CHIEF COMPLAINT:  Recurrent ovarian cancer  HISTORY OF PRESENT ILLNESS: The patient originally presented in the summer of 2010 with cramps and abdominal distension.  I do not have a copy of the initial evaluation, but on November 26, 2008, the patient underwent optimal debulking with bilateral salpingo-oophorectomy, omentectomy, and the placement of an intraperitoneal port.  The pathology from that procedure (Accession Number [id number] at [facility]) showed first of all, significant involvement of the omentum, minimal involvement of the right ovary and right fallopian tube and negative on the left ovary and fallopian tube, with neither ovary being enlarged, consistent with a primary peritoneal serous adenocarcinoma, described as moderately differentiated.  The sample included no lymph nodes.    The patient had an intraperitoneal port placed in the same surgery and was treated with intraperitoneal and IV chemotherapy according to GOG-252 but I am not sure which arm she was in.  (Arm 2 did carboplatin intraperitoneally and Taxol IV.  Arm 3 did cisplatin and paclitaxel intraperitoneally with paclitaxel IV.)  All arms received bevacizumab.  Unfortunately, after 4 cycles of treatment, she had acute mental status changes and was admitted here January of 2011 with what proved to be posterior reversible leukoencephalopathy and SIADH.  She had seizures, aphasia, and required intubation.  Once the patient recovered from this, she was treated with 2 cycles of single-agent carboplatin (I do not have the AUC). Her last adjuvant treatment was May 12, 2009, and her CA-125 at that time was 10.3.  Her intraperitoneal port was removed in April of 2011.    More recently, the patient was in routine follow up when her CA-125 was found to jump up to 2,269.7 (March 26, 2010).   This is 10 months after her last prior chemotherapy.  She had CTs of the chest, abdomen and pelvis January 26th which showed ascites and enhancing peritoneal nodularity. She had had similar findings at presentation but these had completely resolved by the time she finished treatment in March of 2011. The patient was felt to be in first relapse.  Subsequent treatments are as summarized below   INTERVAL HISTORY:  Regina West returns today with her daughter for day 8 cycle 3 of Abraxane. She's currently receiving Abraxane on days 1 and 8 of each 21 day cycle.   In general Regina West is tolerating this regimen well. We continue to see a significant drop in her CEA 125, down to 37.7 last week as compared to 271 in early November. She continues, however, to complain of fatigue, and tells me she has no motivation to "get up and do anything". She continues to have some shortness of breath with exertion, possibly a little worse than it was in the past. She's having more frequent bowel movements but they are normal and formed. She feels like her urine flow is still "dribbling" at times, although she is keeping herself well hydrated. She's had no hematuria and denies any dysuria. She's had no lower back pain.  A urinalysis and urine culture obtained last week on 03/04/2013 were unremarkable with no evidence of UTI.  Regina West has noticed a slight increase in peripheral neuropathy in the tips of her fingers. She thinks this has increased slightly over the past month, but has not worsened at all since her treatment last week. She is still able to perform fine motor skills  such as buttoning a blouse orders it being a jacket. She occasionally finds herself dropping smaller items.   REVIEW OF SYSTEMS:  Regina West  has had no fevers, chills, or night sweats. She denies any skin changes or rashes, and has had no abnormal bruising or bleeding. Her appetite is fair. She's had no problems with nausea or emesis.She denies any increased cough  or phlegm production and has had no chest pain or palpitations. She's had no abnormal headaches or dizziness. Currently, she denies any unusual myalgias, arthralgias, or bony pain, and has had no peripheral swelling. She denies any abdominal or pelvic pain.  Otherwise, detailed review of systems is stable and noncontributory.   PAST MEDICAL HISTORY: Significant for hysterectomy at the age of 39 with concurrent bladder "tuck up."  She also underwent appendectomy during that procedure.  She is status post prior bilateral cataract surgery. She is status post tonsillectomy and adenoidectomy.  She has a history of hypertension and diabetes. She has a history of diverticulosis.  She has a large hiatal hernia.  She has degenerative disk disease.  She has been noted to have coronary calcifications and she has hyperlipidemia.  There is a history of remote tobacco abuse. Seizure disorder as per HPI  FAMILY HISTORY The patient's father died at the age of 1 after heart surgery for aortic stenosis.  The patient's mother died at the age of 71.  The patient has one brother in good health.  There is no other family history to her knowledge of ovarian or breast cancer.    GYNECOLOGIC HISTORY: Menarche at age 61.  She is GX P3 with menopause in her late 25s.  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 She is currently residing in [place]. Her husband was in sales and had a history of Parkinson's disease.  He died following a fall in the year 2000.  The patient's son [name] lives in [place], and has a history of MS.  Daughter [name] lives in [place]. She is a Agricultural engineer.  Son [name] lives in [place], [place] and is in Press photographer. The patient has 8 grandchildren. She attends the [employer].  She is a good friend of our patients J. and [place] DIRECTIVES:  in place  HEALTH MAINTENANCE:  (Updated December 2014) History  Substance Use Topics  . Smoking status: Former Smoker    Quit date: 05/24/1959  . Smokeless tobacco: Never Used  . Alcohol Use: No     Colonoscopy: 2008  PAP: UTD  MM: refuses   Allergies  Allergen Reactions  . Avastin [Bevacizumab] Other (See Comments)    Swelling of the brain     Current Outpatient Prescriptions  Medication Sig Dispense Refill  . acyclovir (ZOVIRAX) 400 MG tablet Take 1 tablet (400 mg total) by mouth 2 (two) times daily.  60 tablet  12  . antiseptic oral rinse (BIOTENE) LIQD 15 mLs by Mouth Rinse route 2 (two) times daily.  1 Bottle  0  . cholecalciferol (VITAMIN D) 1000 UNITS tablet Take 1,000 Units by mouth 2 (two) times daily.      Marland Kitchen levETIRAcetam (KEPPRA) 250 MG tablet take 1 tablet by mouth twice a day  60 tablet  0  . nitrofurantoin (MACRODANTIN) 50 MG capsule TAKE TWO CAPSULES BY MOUTH ONCE DAILY  60 capsule  3  . omeprazole (  PRILOSEC) 40 MG capsule Take 1 capsule (40 mg total) by mouth at bedtime.  30 capsule  3  . ondansetron (ZOFRAN) 8 MG tablet Take 1 tablet (8 mg total) by mouth 2 (two) times daily. Take two times a day starting the day after chemo for 2 days. Then take two times a day as needed for nausea or vomiting.  30 tablet  1  . VIIBRYD 40 MG TABS       . prochlorperazine (COMPAZINE) 10 MG tablet Take 1 tablet (10 mg total) by mouth every 6 (six) hours as needed (Nausea or vomiting).  30 tablet  1   No current facility-administered medications for this visit.   Facility-Administered Medications Ordered in Other Visits  Medication Dose Route Frequency Provider Last Rate Last Dose  . heparin lock flush 100 unit/mL  500 Units Intracatheter Once PRN Barnell Shieh Milda Smart, PA-C      . PACLitaxel-protein bound (ABRAXANE) chemo infusion 175 mg  100 mg/m2 (Treatment Plan Actual) Intravenous Once Theotis Burrow, PA-C 70 mL/hr at 03/11/13 1546 175 mg at 03/11/13 1546  . sodium chloride 0.9 %  injection 10 mL  10 mL Intracatheter PRN Alanee Ting Milda Smart, PA-C        OBJECTIVE: Regina West who appears slightly weak but is in no acute distress  Filed Vitals:   03/11/13 1321  BP: 133/73  Pulse: 83  Temp: 98.4 F (36.9 C)  Resp: 18     Body mass index is 27.34 kg/(m^2).    ECOG FS: 1 Filed Weights   03/11/13 1321  Weight: 169 lb 4.8 oz (76.794 kg)   Physical Exam: HEENT:  Sclerae anicteric.  Oropharynx clear and moist. No ulcerations or oropharyngeal candidiasis. Neck is supple. NODES:  No cervical, supraclavicular, or axillary lymphadenopathy palpated.  BREAST EXAM:  Deferred. LUNGS:  Clear to auscultation bilaterally, with fair excursion. Breath sounds are slightly decreased bilaterally in the bases. No dullness to percussion. No wheezes or rhonchi HEART:  Regular rate and rhythm.  ABDOMEN:  Soft, mildly distended but nontender to palpation. No obvious ascites or masses palpated. Positive, normoactive bowel sounds. No CVA tenderness. MSK:  No focal spinal tenderness to palpation. Full range of motion in the upper extremities. EXTREMITIES:  No peripheral edema.   SKIN:  No rashes or skin changes noted. No nailbed changes. Alopecia is evident today. NEURO:  Nonfocal. Well oriented.  Appropriate affect.     LAB RESULTS: CA 125   37.7  03/04/2013    93.8  02/04/2013    200.6  01/23/2013    271.0  01/11/2013    245.0  01/07/2013     18.7  11/19/2012    Lab Results  Component Value Date   WBC 4.3 03/11/2013   NEUTROABS 3.1 03/11/2013   HGB 10.0* 03/11/2013   HCT 30.1* 03/11/2013   MCV 100.0 03/11/2013   PLT 149 03/11/2013      Chemistry      Component Value Date/Time   NA 135* 03/11/2013 1306   NA 136 10/17/2011 1118   NA 143 05/12/2011 1337   K 4.4 03/11/2013 1306   K 3.9 10/17/2011 1118   K 4.1 05/12/2011 1337   CL 100 08/20/2012 1124   CL 100 10/17/2011 1118   CL 92* 05/12/2011 1337   CO2 23 03/11/2013 1306   CO2 27 10/17/2011 1118   CO2 32 05/12/2011 1337   West 21.8 03/11/2013  1306   West 25* 10/17/2011 1118   West  19 05/12/2011 1337   CREATININE 1.0 03/11/2013 1306   CREATININE 1.10 10/17/2011 1118   CREATININE 1.3* 05/12/2011 1337      Component Value Date/Time   CALCIUM 9.6 03/11/2013 1306   CALCIUM 9.8 10/17/2011 1118   CALCIUM 9.8 05/12/2011 1337   ALKPHOS 98 03/11/2013 1306   ALKPHOS 109 10/17/2011 1118   ALKPHOS 95* 05/12/2011 1337   AST 19 03/11/2013 1306   AST 22 10/17/2011 1118   AST 40* 05/12/2011 1337   ALT 20 03/11/2013 1306   ALT 14 10/17/2011 1118   ALT 28 05/12/2011 1337   BILITOT 0.42 03/11/2013 1306   BILITOT 0.4 10/17/2011 1118   BILITOT 0.60 05/12/2011 1337      STUDIES: No results found.   ASSESSMENT: 80 y.o.  Regina West   (1) status post optimal debulking of a primary peritoneal serous adenocarcinoma September 2010, the tumor being moderately differentiated, pT3c NX (stage IIIC), treated according to GOG 252 with  intraperitoneal platinum and paclitaxel x4 given along with bevacizumab, complicated by SIADHafter cycle 4, also with posterior reversible leukoencephalopathy.  After these problems resolved she received 2 additional cycles of single-agent carboplatin completed in March 2011  (2)  She had her first recurrence February 2012  treated with carboplatin and Gemzar for a total of 6 cycles between February and June 2012.  Her CEA-125 had normalized before the beginning of cycle 5.   (3) second recurrence documented February 2013,  treated with carboplatin/ doxil x 6, completed 10/03/2011, with a good response noted on restaging scans  (4) third recurrence documented February 2014, receiving single-agent carboplatin every 3 weeks, first dose 04/30/2012, completed 08/20/2012  (5) fourth recurrence documented by abdominal symptoms and a rising CA 125 November 2014, receiving Abraxane on days one and 8 of each 21 day cycle, with first dose given on 01/16/2013.  PLAN:  Regina West  continues to tolerate the Abraxane well overall and will proceed to treatment  today as scheduled for day 8 cycle 3. We will continue to follow her very closely, and we'll continue to assess her peripheral neuropathy with each visit.   I offered Tokiko further evaluation of her changes in urinary habits, perhaps an ultrasound to evaluate any type of obstruction or blockage, or a referral to urology. She declines at this time, but agrees to contact us with any changes, specifically any dysuria or hematuria, any lower back or flank pain, or additional decrease in urination.  Regina West would be due for day 1 cycle 4 of Abraxane in 2 weeks, January 19. However, she is planning a trip to [place] with her son that weekend. She has requested that we hold treatment for one week, and initiate cycle 4 on January 26. Accordingly, I will see her with labs and physical exam prior to day 1 cycle 4 on January 26, and she will see Dr. Jana Hakim the following week on February 2. At that time, they will also need to review her treatment plan and discuss when and if to pursue additional imaging.  Regina West has a good understanding of the above and she is very much in agreement with this plan. She knows to call prior to her next appointment with any changes or problems.    Regina Berroa PA-C     03/11/2013

## 2013-03-11 NOTE — Telephone Encounter (Signed)
Per staff message and POF I have scheduled appts.  JMW  

## 2013-03-11 NOTE — Patient Instructions (Signed)
[facility] Discharge Instructions for Patients Receiving Chemotherapy  Today you received the following chemotherapy agents  Abraxane  To help prevent nausea and vomiting after your treatment, we encourage you to take your nausea medication if needed   If you develop nausea and vomiting that is not controlled by your nausea medication, call the clinic.   BELOW ARE SYMPTOMS THAT SHOULD BE REPORTED IMMEDIATELY:  *FEVER GREATER THAN 100.5 F  *CHILLS WITH OR WITHOUT FEVER  NAUSEA AND VOMITING THAT IS NOT CONTROLLED WITH YOUR NAUSEA MEDICATION  *UNUSUAL SHORTNESS OF BREATH  *UNUSUAL BRUISING OR BLEEDING  TENDERNESS IN MOUTH AND THROAT WITH OR WITHOUT PRESENCE OF ULCERS  *URINARY PROBLEMS  *BOWEL PROBLEMS  UNUSUAL RASH Items with * indicate a potential emergency and should be followed up as soon as possible.  Feel free to call the clinic you have any questions or concerns. The clinic phone number is [phone].

## 2013-03-11 NOTE — Telephone Encounter (Signed)
Needs refill of Keppra - pharmacist said they had sent several faxes over and patient needs to schedule apt with Dr. Brett Fairy. They have a 7-day supply of med. Please call to advise. Alternate number for patient tomorrow is [phone]

## 2013-03-11 NOTE — Telephone Encounter (Signed)
Rx was already sent.  Pharmacy was faxing requests when the office was closed for the holiday.I called back, spoke with Lesa.  They did not check with the pharmacy, but will now.

## 2013-03-11 NOTE — Telephone Encounter (Signed)
appts made and printed. Pt is aware that tx will be added. i emailed MW to add the tx...td 

## 2013-03-13 NOTE — Progress Notes (Signed)
Always happy to see Regina West again. Please advise if a follow up is needed soon. C. Javon Snee,MD.

## 2013-03-14 ENCOUNTER — Telehealth: Payer: Self-pay | Admitting: Neurology

## 2013-03-14 MED ORDER — LEVETIRACETAM 250 MG PO TABS: 250.0000 mg | ORAL_TABLET | Freq: Two times a day (BID) | ORAL | Status: DC

## 2013-03-14 NOTE — Telephone Encounter (Signed)
Rx was already sent on 01/04.  I have resent the Rx.  Regina West is aware Rx was sent to last until appt

## 2013-03-18 ENCOUNTER — Ambulatory Visit: Payer: Medicare Other | Admitting: Physician Assistant

## 2013-03-18 ENCOUNTER — Other Ambulatory Visit: Payer: Medicare Other

## 2013-03-18 ENCOUNTER — Ambulatory Visit: Payer: Medicare Other

## 2013-03-22 ENCOUNTER — Other Ambulatory Visit (HOSPITAL_BASED_OUTPATIENT_CLINIC_OR_DEPARTMENT_OTHER): Payer: Medicare Other

## 2013-03-22 DIAGNOSIS — C482 Malignant neoplasm of peritoneum, unspecified: Secondary | ICD-10-CM

## 2013-03-22 DIAGNOSIS — C481 Malignant neoplasm of specified parts of peritoneum: Secondary | ICD-10-CM

## 2013-03-22 LAB — CBC WITH DIFFERENTIAL/PLATELET
BASO%: 0 % (ref 0.0–2.0)
Basophils Absolute: 0 10*3/uL (ref 0.0–0.1)
EOS ABS: 0 10*3/uL (ref 0.0–0.5)
EOS%: 0 % (ref 0.0–7.0)
HCT: 29.7 % — ABNORMAL LOW (ref 34.8–46.6)
HGB: 9.8 g/dL — ABNORMAL LOW (ref 11.6–15.9)
LYMPH%: 12.7 % — ABNORMAL LOW (ref 14.0–49.7)
MCH: 33 pg (ref 25.1–34.0)
MCHC: 33 g/dL (ref 31.5–36.0)
MCV: 100 fL (ref 79.5–101.0)
MONO#: 0.3 10*3/uL (ref 0.1–0.9)
MONO%: 12.3 % (ref 0.0–14.0)
NEUT%: 75 % (ref 38.4–76.8)
NEUTROS ABS: 2 10*3/uL (ref 1.5–6.5)
NRBC: 0 % (ref 0–0)
Platelets: 201 10*3/uL (ref 145–400)
RBC: 2.97 10*6/uL — ABNORMAL LOW (ref 3.70–5.45)
RDW: 15.2 % — AB (ref 11.2–14.5)
WBC: 2.7 10*3/uL — ABNORMAL LOW (ref 3.9–10.3)
lymph#: 0.3 10*3/uL — ABNORMAL LOW (ref 0.9–3.3)

## 2013-03-22 LAB — COMPREHENSIVE METABOLIC PANEL (CC13)
ALT: 15 U/L (ref 0–55)
AST: 17 U/L (ref 5–34)
Albumin: 3.8 g/dL (ref 3.5–5.0)
Alkaline Phosphatase: 95 U/L (ref 40–150)
Anion Gap: 14 mEq/L — ABNORMAL HIGH (ref 3–11)
BILIRUBIN TOTAL: 0.49 mg/dL (ref 0.20–1.20)
BUN: 27.8 mg/dL — AB (ref 7.0–26.0)
CHLORIDE: 102 meq/L (ref 98–109)
CO2: 21 mEq/L — ABNORMAL LOW (ref 22–29)
CREATININE: 1.4 mg/dL — AB (ref 0.6–1.1)
Calcium: 10 mg/dL (ref 8.4–10.4)
Glucose: 255 mg/dl — ABNORMAL HIGH (ref 70–140)
Potassium: 4.4 mEq/L (ref 3.5–5.1)
Sodium: 138 mEq/L (ref 136–145)
Total Protein: 6.6 g/dL (ref 6.4–8.3)

## 2013-03-23 LAB — CA 125: CA 125: 23.7 U/mL (ref 0.0–30.2)

## 2013-04-01 ENCOUNTER — Ambulatory Visit: Payer: Medicare Other

## 2013-04-01 ENCOUNTER — Encounter (INDEPENDENT_AMBULATORY_CARE_PROVIDER_SITE_OTHER): Payer: Self-pay

## 2013-04-01 ENCOUNTER — Ambulatory Visit (HOSPITAL_BASED_OUTPATIENT_CLINIC_OR_DEPARTMENT_OTHER): Payer: Medicare Other | Admitting: Physician Assistant

## 2013-04-01 ENCOUNTER — Encounter: Payer: Self-pay | Admitting: Physician Assistant

## 2013-04-01 ENCOUNTER — Other Ambulatory Visit (HOSPITAL_BASED_OUTPATIENT_CLINIC_OR_DEPARTMENT_OTHER): Payer: Medicare Other

## 2013-04-01 ENCOUNTER — Ambulatory Visit (HOSPITAL_BASED_OUTPATIENT_CLINIC_OR_DEPARTMENT_OTHER): Payer: Medicare Other

## 2013-04-01 ENCOUNTER — Telehealth: Payer: Self-pay | Admitting: *Deleted

## 2013-04-01 VITALS — BP 147/83 | HR 87 | Temp 98.4°F | Resp 18 | Ht 66.0 in | Wt 166.7 lb

## 2013-04-01 DIAGNOSIS — C482 Malignant neoplasm of peritoneum, unspecified: Secondary | ICD-10-CM

## 2013-04-01 DIAGNOSIS — Z5111 Encounter for antineoplastic chemotherapy: Secondary | ICD-10-CM

## 2013-04-01 DIAGNOSIS — G5690 Unspecified mononeuropathy of unspecified upper limb: Secondary | ICD-10-CM

## 2013-04-01 DIAGNOSIS — C569 Malignant neoplasm of unspecified ovary: Secondary | ICD-10-CM

## 2013-04-01 DIAGNOSIS — G8929 Other chronic pain: Secondary | ICD-10-CM | POA: Insufficient documentation

## 2013-04-01 DIAGNOSIS — M25569 Pain in unspecified knee: Secondary | ICD-10-CM

## 2013-04-01 DIAGNOSIS — D649 Anemia, unspecified: Secondary | ICD-10-CM

## 2013-04-01 LAB — COMPREHENSIVE METABOLIC PANEL (CC13)
ALBUMIN: 3.7 g/dL (ref 3.5–5.0)
ALK PHOS: 96 U/L (ref 40–150)
ALT: 15 U/L (ref 0–55)
ANION GAP: 9 meq/L (ref 3–11)
AST: 17 U/L (ref 5–34)
BILIRUBIN TOTAL: 0.41 mg/dL (ref 0.20–1.20)
BUN: 22.7 mg/dL (ref 7.0–26.0)
CO2: 27 meq/L (ref 22–29)
Calcium: 9.8 mg/dL (ref 8.4–10.4)
Chloride: 104 mEq/L (ref 98–109)
Creatinine: 1.2 mg/dL — ABNORMAL HIGH (ref 0.6–1.1)
GLUCOSE: 165 mg/dL — AB (ref 70–140)
Potassium: 4.5 mEq/L (ref 3.5–5.1)
SODIUM: 140 meq/L (ref 136–145)
TOTAL PROTEIN: 6.2 g/dL — AB (ref 6.4–8.3)

## 2013-04-01 LAB — CBC WITH DIFFERENTIAL/PLATELET
BASO%: 0.2 % (ref 0.0–2.0)
BASOS ABS: 0 10*3/uL (ref 0.0–0.1)
EOS ABS: 0.1 10*3/uL (ref 0.0–0.5)
EOS%: 2.2 % (ref 0.0–7.0)
HCT: 33.9 % — ABNORMAL LOW (ref 34.8–46.6)
HEMOGLOBIN: 10.9 g/dL — AB (ref 11.6–15.9)
LYMPH%: 16.6 % (ref 14.0–49.7)
MCH: 32.5 pg (ref 25.1–34.0)
MCHC: 32.2 g/dL (ref 31.5–36.0)
MCV: 101.2 fL — ABNORMAL HIGH (ref 79.5–101.0)
MONO#: 0.6 10*3/uL (ref 0.1–0.9)
MONO%: 12.8 % (ref 0.0–14.0)
NEUT%: 68.2 % (ref 38.4–76.8)
NEUTROS ABS: 3.4 10*3/uL (ref 1.5–6.5)
PLATELETS: 160 10*3/uL (ref 145–400)
RBC: 3.35 10*6/uL — ABNORMAL LOW (ref 3.70–5.45)
RDW: 15.7 % — ABNORMAL HIGH (ref 11.2–14.5)
WBC: 4.9 10*3/uL (ref 3.9–10.3)
lymph#: 0.8 10*3/uL — ABNORMAL LOW (ref 0.9–3.3)
nRBC: 0 % (ref 0–0)

## 2013-04-01 MED ORDER — ONDANSETRON 8 MG/50ML IVPB (CHCC)
8.0000 mg | Freq: Once | INTRAVENOUS | Status: AC
  Administered 2013-04-01: 8 mg via INTRAVENOUS

## 2013-04-01 MED ORDER — SODIUM CHLORIDE 0.9 % IJ SOLN
10.0000 mL | INTRAMUSCULAR | Status: DC | PRN
  Administered 2013-04-01: 10 mL
  Filled 2013-04-01: qty 10

## 2013-04-01 MED ORDER — PACLITAXEL PROTEIN-BOUND CHEMO INJECTION 100 MG
100.0000 mg/m2 | Freq: Once | INTRAVENOUS | Status: AC
  Administered 2013-04-01: 175 mg via INTRAVENOUS
  Filled 2013-04-01: qty 35

## 2013-04-01 MED ORDER — ONDANSETRON 8 MG/NS 50 ML IVPB
INTRAVENOUS | Status: AC
  Filled 2013-04-01: qty 8

## 2013-04-01 MED ORDER — SODIUM CHLORIDE 0.9 % IV SOLN
Freq: Once | INTRAVENOUS | Status: AC
  Administered 2013-04-01: 11:00:00 via INTRAVENOUS

## 2013-04-01 MED ORDER — HEPARIN SOD (PORK) LOCK FLUSH 100 UNIT/ML IV SOLN
500.0000 [IU] | Freq: Once | INTRAVENOUS | Status: AC | PRN
  Administered 2013-04-01: 500 [IU]
  Filled 2013-04-01: qty 5

## 2013-04-01 MED ORDER — DEXAMETHASONE SODIUM PHOSPHATE 10 MG/ML IJ SOLN
INTRAMUSCULAR | Status: AC
  Filled 2013-04-01: qty 1

## 2013-04-01 MED ORDER — DEXAMETHASONE SODIUM PHOSPHATE 10 MG/ML IJ SOLN
4.0000 mg | Freq: Once | INTRAMUSCULAR | Status: AC
  Administered 2013-04-01: 4 mg via INTRAVENOUS

## 2013-04-01 NOTE — Progress Notes (Signed)
ID: Regina West   DOB: [birth year]  MR#: [id number]  IEP#:[id number]  PCP: Regina Lyons, MD GYN: SU: Regina Minister, MD OTHER MD: Regina Seat, MD  CHIEF COMPLAINT:  Recurrent ovarian cancer  HISTORY OF PRESENT ILLNESS: The patient originally presented in the summer of 2010 with cramps and abdominal distension.  I do not have a copy of the initial evaluation, but on November 26, 2008, the patient underwent optimal debulking with bilateral salpingo-oophorectomy, omentectomy, and the placement of an intraperitoneal port.  The pathology from that procedure (Accession Number [id number] at [facility]) showed first of all, significant involvement of the omentum, minimal involvement of the right ovary and right fallopian tube and negative on the left ovary and fallopian tube, with neither ovary being enlarged, consistent with a primary peritoneal serous adenocarcinoma, described as moderately differentiated.  The sample included no lymph nodes.    The patient had an intraperitoneal port placed in the same surgery and was treated with intraperitoneal and IV chemotherapy according to GOG-252 but I am not sure which arm she was in.  (Arm 2 did carboplatin intraperitoneally and Taxol IV.  Arm 3 did cisplatin and paclitaxel intraperitoneally with paclitaxel IV.)  All arms received bevacizumab.  Unfortunately, after 4 cycles of treatment, she had acute mental status changes and was admitted here January of 2011 with what proved to be posterior reversible leukoencephalopathy and SIADH.  She had seizures, aphasia, and required intubation.  Once the patient recovered from this, she was treated with 2 cycles of single-agent carboplatin (I do not have the AUC). Her last adjuvant treatment was May 12, 2009, and her CA-125 at that time was 10.3.  Her intraperitoneal port was removed in April of 2011.    More recently, the patient was in routine follow up when her CA-125 was found to jump up to 2,269.7 (March 26, 2010).   This is 10 months after her last prior chemotherapy.  She had CTs of the chest, abdomen and pelvis January 26th which showed ascites and enhancing peritoneal nodularity. She had had similar findings at presentation but these had completely resolved by the time she finished treatment in March of 2011. The patient was felt to be in first relapse.  Subsequent treatments are as summarized below   INTERVAL HISTORY:  Regina West returns today with her daughter and daughter-in-law for followup of her recurrent ovarian cancer. She is due for day 1 cycle 4 of Abraxane which she is currently receiving on days 1 and 8 of each 21 day cycle. She had an extra week off this past cycle due to a planned trip to [place]. Unfortunately, the trip was canceled, but the extra week off seems to have done [name] some good. She tells me she is feeling much better today, and has a much better energy level. I will bring mention of the fact that her recent CA 125, drawn on 03/22/2013, was within normal range at 23.7.  Regina West has some chronic knee pain associated with arthritis, and she recently started receiving physical therapy which she has found extremely helpful. She also admits that when she exercises, her energy improved as well.  She continues to have some peripheral neuropathy in the tips of her fingers, but does not believe that this has increased significantly since her last visit here in early January. She is not think it is affecting any of her day-to-day activities, and she finds herself still able to perform fine motor skills without difficulty.  REVIEW OF SYSTEMS:  Regina West  has had no fevers, chills, or night sweats. She denies any skin changes or rashes.  She has had no abnormal bruising or bleeding. She's keeping herself well hydrated. She denies any problems with nausea or emesis.  She drinks prune juice daily for mild constipation and is having regular bowel movements. She's having less discomfort with urination. She  denies any increased cough or phlegm production, increased shortness of breath, chest pain or palpitations. She's had no abnormal headaches. Currently, she denies any unusual myalgias, arthralgias, or bony pain. She had some slight swelling in the left leg last week, associated with her knee pain, but that has completely resolved. She denies any abdominal or pelvic pain.  Otherwise, detailed review of systems is stable and noncontributory.   PAST MEDICAL HISTORY: Significant for hysterectomy at the age of 63 with concurrent bladder "tuck up."  She also underwent appendectomy during that procedure.  She is status post prior bilateral cataract surgery. She is status post tonsillectomy and adenoidectomy.  She has a history of hypertension and diabetes. She has a history of diverticulosis.  She has a large hiatal hernia.  She has degenerative disk disease.  She has been noted to have coronary calcifications and she has hyperlipidemia.  There is a history of remote tobacco abuse. Seizure disorder as per HPI  FAMILY HISTORY The patient's father died at the age of 73 after heart surgery for aortic stenosis.  The patient's mother died at the age of 23.  The patient has one brother in good health.  There is no other family history to her knowledge of ovarian or breast cancer.    GYNECOLOGIC HISTORY: Menarche at age 23.  She is GX P3 with menopause in her late 39s.  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 She is currently residing in [place]. Her husband was in sales and had a history of Parkinson's disease.  He died following a fall in the year 2000.  The patient's son Regina West lives in [place], and has a history of MS.  Daughter Regina West lives in [place]. She is a Agricultural engineer.  Son Regina West lives in [place], [place] and is in Press photographer. The patient has 8 grandchildren. She  attends the [employer].  She is a good friend of our patients J. and [place] DIRECTIVES: in place  HEALTH MAINTENANCE:  (Updated December 2014) History  Substance Use Topics  . Smoking status: Former Smoker    Quit date: 05/24/1959  . Smokeless tobacco: Never Used  . Alcohol Use: No     Colonoscopy: 2008  PAP: UTD  MM:  Declines   Allergies  Allergen Reactions  . Avastin [Bevacizumab] Other (See Comments)    Swelling of the brain     Current Outpatient Prescriptions  Medication Sig Dispense Refill  . acyclovir (ZOVIRAX) 400 MG tablet Take 1 tablet (400 mg total) by mouth 2 (two) times daily.  60 tablet  12  . antiseptic oral rinse (BIOTENE) LIQD 15 mLs by Mouth Rinse route 2 (two) times daily.  1 Bottle  0  . cholecalciferol (VITAMIN D) 1000 UNITS tablet Take 1,000 Units by mouth 2 (two) times daily.      Marland Kitchen levETIRAcetam (KEPPRA) 250 MG tablet Take 1 tablet (250 mg total) by mouth 2 (two) times daily.  60 tablet  0  . nitrofurantoin (MACRODANTIN) 50  MG capsule TAKE TWO CAPSULES BY MOUTH ONCE DAILY  60 capsule  3  . omeprazole (PRILOSEC) 40 MG capsule Take 1 capsule (40 mg total) by mouth at bedtime.  30 capsule  3  . VIIBRYD 40 MG TABS       . ondansetron (ZOFRAN) 8 MG tablet Take 1 tablet (8 mg total) by mouth 2 (two) times daily. Take two times a day starting the day after chemo for 2 days. Then take two times a day as needed for nausea or vomiting.  30 tablet  1  . prochlorperazine (COMPAZINE) 10 MG tablet Take 1 tablet (10 mg total) by mouth every 6 (six) hours as needed (Nausea or vomiting).  30 tablet  1   No current facility-administered medications for this visit.    OBJECTIVE: Elderly white woman who appears comfortable and is in no acute distress  Filed Vitals:   04/01/13 1020  BP: 147/83  Pulse: 87  Temp: 98.4 F (36.9 C)  Resp: 18     Body mass index is 26.92 kg/(m^2).    ECOG FS: 1 Filed Weights   04/01/13 1020  Weight: 166 lb  11.2 oz (75.615 kg)   Physical Exam: HEENT:  Sclerae anicteric.  Oropharynx clear and moist with no ulcerations or oropharyngeal candidiasis. Neck is supple, trachea midline. NODES:  No cervical, supraclavicular, or axillary lymphadenopathy palpated.  BREAST EXAM:  Deferred. LUNGS:  Clear to auscultation bilaterally, with fair excursion. Breath sounds are slightly decreased bilaterally in the bases, but there no rales, wheezes, or rhonchi.. No dullness to percussion.  HEART:  Regular rate and rhythm.  ABDOMEN:  Soft, mildly distended but nontender to palpation. No obvious ascites or masses palpated. Normoactive bowel sounds.  MSK:  No focal spinal tenderness to palpation. Full range of motion in the upper extremities. EXTREMITIES:  No peripheral edema.   SKIN:  No rashes or skin changes noted. No nailbed changes. No notable ecchymoses. No petechiae. Alopecia is evident today. NEURO:  Nonfocal. Well oriented.  Appropriate affect.     LAB RESULTS: CA 125   23.7  03/22/2013    37.7  03/04/2013    93.8  02/04/2013    200.6  01/23/2013    271.0  01/11/2013    245.0  01/07/2013     18.7  11/19/2012    Lab Results  Component Value Date   WBC 4.9 04/01/2013   NEUTROABS 3.4 04/01/2013   HGB 10.9* 04/01/2013   HCT 33.9* 04/01/2013   MCV 101.2* 04/01/2013   PLT 160 04/01/2013      Chemistry      Component Value Date/Time   NA 138 03/22/2013 1023   NA 136 10/17/2011 1118   NA 143 05/12/2011 1337   K 4.4 03/22/2013 1023   K 3.9 10/17/2011 1118   K 4.1 05/12/2011 1337   CL 100 08/20/2012 1124   CL 100 10/17/2011 1118   CL 92* 05/12/2011 1337   CO2 21* 03/22/2013 1023   CO2 27 10/17/2011 1118   CO2 32 05/12/2011 1337   West 27.8* 03/22/2013 1023   West 25* 10/17/2011 1118   West 19 05/12/2011 1337   CREATININE 1.4* 03/22/2013 1023   CREATININE 1.10 10/17/2011 1118   CREATININE 1.3* 05/12/2011 1337      Component Value Date/Time   CALCIUM 10.0 03/22/2013 1023   CALCIUM 9.8 10/17/2011 1118   CALCIUM 9.8  05/12/2011 1337   ALKPHOS 95 03/22/2013 1023   ALKPHOS 109 10/17/2011 1118  ALKPHOS 95* 05/12/2011 1337   AST 17 03/22/2013 1023   AST 22 10/17/2011 1118   AST 40* 05/12/2011 1337   ALT 15 03/22/2013 1023   ALT 14 10/17/2011 1118   ALT 28 05/12/2011 1337   BILITOT 0.49 03/22/2013 1023   BILITOT 0.4 10/17/2011 1118   BILITOT 0.60 05/12/2011 1337      STUDIES: No results found.   ASSESSMENT: 80 y.o.  [place] woman   (1) status post optimal debulking of a primary peritoneal serous adenocarcinoma September 2010, the tumor being moderately differentiated, pT3c NX (stage IIIC), treated according to GOG 252 with  intraperitoneal platinum and paclitaxel x4 given along with bevacizumab, complicated by SIADHafter cycle 4, also with posterior reversible leukoencephalopathy.  After these problems resolved she received 2 additional cycles of single-agent carboplatin completed in March 2011  (2)  She had her first recurrence February 2012  treated with carboplatin and Gemzar for a total of 6 cycles between February and June 2012.  Her CEA-125 had normalized before the beginning of cycle 5.   (3) second recurrence documented February 2013,  treated with carboplatin/ doxil x 6, completed 10/03/2011, with a good response noted on restaging scans  (4) third recurrence documented February 2014, receiving single-agent carboplatin every 3 weeks, first dose 04/30/2012, completed 08/20/2012  (5) fourth recurrence documented by abdominal symptoms and a rising CA 125 November 2014, receiving Abraxane on days one and 8 of each 21 day cycle, with first dose given on 01/16/2013.  PLAN:  Regina West will proceed to treatment today as scheduled to initiate cycle 4 of Abraxane. She scheduled to see Dr. Jana Hakim next week prior to day 8 cycle 4, and they will review her treatment plan and the need for any additional imaging at that time. Of course we'll continue to follow her very closely, and we will continue to assess her  peripheral neuropathy at each visit. Thus far, it appears to be stable.   Regina West has a good understanding of the above and she is very much in agreement with this plan. She knows to call prior to her next appointment with any changes or problems.   Jazira Maloney PA-C     04/01/2013

## 2013-04-01 NOTE — Progress Notes (Signed)
i have received a copy of the assessment and plan, as directed and discussed with PCP  Leasha Goldberger, MD

## 2013-04-01 NOTE — Telephone Encounter (Signed)
appts made and printed...td 

## 2013-04-01 NOTE — Patient Instructions (Signed)
[facility] Discharge Instructions for Patients Receiving Chemotherapy  Today you received the following chemotherapy agents Abraxane.  To help prevent nausea and vomiting after your treatment, we encourage you to take your nausea medication Compazine 10 mg every 6 hours or Zofran 8 mg every 12 hours as needed.   If you develop nausea and vomiting that is not controlled by your nausea medication, call the clinic.   BELOW ARE SYMPTOMS THAT SHOULD BE REPORTED IMMEDIATELY:  *FEVER GREATER THAN 100.5 F  *CHILLS WITH OR WITHOUT FEVER  NAUSEA AND VOMITING THAT IS NOT CONTROLLED WITH YOUR NAUSEA MEDICATION  *UNUSUAL SHORTNESS OF BREATH  *UNUSUAL BRUISING OR BLEEDING  TENDERNESS IN MOUTH AND THROAT WITH OR WITHOUT PRESENCE OF ULCERS  *URINARY PROBLEMS  *BOWEL PROBLEMS  UNUSUAL RASH Items with * indicate a potential emergency and should be followed up as soon as possible.  Feel free to call the clinic you have any questions or concerns. The clinic phone number is [phone].

## 2013-04-02 LAB — CA 125: CA 125: 24.8 U/mL (ref 0.0–30.2)

## 2013-04-04 ENCOUNTER — Encounter: Payer: Self-pay | Admitting: Neurology

## 2013-04-08 ENCOUNTER — Telehealth: Payer: Self-pay | Admitting: *Deleted

## 2013-04-08 ENCOUNTER — Other Ambulatory Visit: Payer: Self-pay | Admitting: Oncology

## 2013-04-08 ENCOUNTER — Telehealth: Payer: Self-pay | Admitting: Physician Assistant

## 2013-04-08 ENCOUNTER — Other Ambulatory Visit (HOSPITAL_BASED_OUTPATIENT_CLINIC_OR_DEPARTMENT_OTHER): Payer: Medicare Other

## 2013-04-08 ENCOUNTER — Ambulatory Visit (HOSPITAL_BASED_OUTPATIENT_CLINIC_OR_DEPARTMENT_OTHER): Payer: Medicare Other | Admitting: Oncology

## 2013-04-08 ENCOUNTER — Ambulatory Visit (HOSPITAL_BASED_OUTPATIENT_CLINIC_OR_DEPARTMENT_OTHER): Payer: Medicare Other

## 2013-04-08 VITALS — BP 114/67 | HR 85 | Temp 98.8°F | Resp 18 | Ht 66.0 in | Wt 166.6 lb

## 2013-04-08 DIAGNOSIS — G40909 Epilepsy, unspecified, not intractable, without status epilepticus: Secondary | ICD-10-CM

## 2013-04-08 DIAGNOSIS — C482 Malignant neoplasm of peritoneum, unspecified: Secondary | ICD-10-CM

## 2013-04-08 DIAGNOSIS — C569 Malignant neoplasm of unspecified ovary: Secondary | ICD-10-CM

## 2013-04-08 DIAGNOSIS — Z5111 Encounter for antineoplastic chemotherapy: Secondary | ICD-10-CM

## 2013-04-08 DIAGNOSIS — K5900 Constipation, unspecified: Secondary | ICD-10-CM

## 2013-04-08 DIAGNOSIS — D649 Anemia, unspecified: Secondary | ICD-10-CM

## 2013-04-08 DIAGNOSIS — I951 Orthostatic hypotension: Secondary | ICD-10-CM

## 2013-04-08 DIAGNOSIS — E871 Hypo-osmolality and hyponatremia: Secondary | ICD-10-CM

## 2013-04-08 DIAGNOSIS — G589 Mononeuropathy, unspecified: Secondary | ICD-10-CM

## 2013-04-08 LAB — CBC WITH DIFFERENTIAL/PLATELET
BASO%: 0.2 % (ref 0.0–2.0)
Basophils Absolute: 0 10*3/uL (ref 0.0–0.1)
EOS ABS: 0.1 10*3/uL (ref 0.0–0.5)
EOS%: 2.8 % (ref 0.0–7.0)
HCT: 29.7 % — ABNORMAL LOW (ref 34.8–46.6)
HGB: 9.7 g/dL — ABNORMAL LOW (ref 11.6–15.9)
LYMPH%: 13 % — AB (ref 14.0–49.7)
MCH: 32.7 pg (ref 25.1–34.0)
MCHC: 32.7 g/dL (ref 31.5–36.0)
MCV: 100 fL (ref 79.5–101.0)
MONO#: 0.3 10*3/uL (ref 0.1–0.9)
MONO%: 6.9 % (ref 0.0–14.0)
NEUT%: 77.1 % — AB (ref 38.4–76.8)
NEUTROS ABS: 3.3 10*3/uL (ref 1.5–6.5)
PLATELETS: 137 10*3/uL — AB (ref 145–400)
RBC: 2.97 10*6/uL — ABNORMAL LOW (ref 3.70–5.45)
RDW: 15.4 % — ABNORMAL HIGH (ref 11.2–14.5)
WBC: 4.2 10*3/uL (ref 3.9–10.3)
lymph#: 0.6 10*3/uL — ABNORMAL LOW (ref 0.9–3.3)

## 2013-04-08 LAB — COMPREHENSIVE METABOLIC PANEL (CC13)
ALK PHOS: 95 U/L (ref 40–150)
ALT: 19 U/L (ref 0–55)
AST: 17 U/L (ref 5–34)
Albumin: 3.7 g/dL (ref 3.5–5.0)
Anion Gap: 9 mEq/L (ref 3–11)
BILIRUBIN TOTAL: 0.42 mg/dL (ref 0.20–1.20)
BUN: 26.6 mg/dL — ABNORMAL HIGH (ref 7.0–26.0)
CO2: 26 mEq/L (ref 22–29)
CREATININE: 1 mg/dL (ref 0.6–1.1)
Calcium: 9.7 mg/dL (ref 8.4–10.4)
Chloride: 105 mEq/L (ref 98–109)
GLUCOSE: 113 mg/dL (ref 70–140)
Potassium: 4.3 mEq/L (ref 3.5–5.1)
SODIUM: 140 meq/L (ref 136–145)
TOTAL PROTEIN: 6.1 g/dL — AB (ref 6.4–8.3)

## 2013-04-08 MED ORDER — ONDANSETRON 8 MG/50ML IVPB (CHCC)
8.0000 mg | Freq: Once | INTRAVENOUS | Status: AC
  Administered 2013-04-08: 8 mg via INTRAVENOUS

## 2013-04-08 MED ORDER — ONDANSETRON 8 MG/NS 50 ML IVPB
INTRAVENOUS | Status: AC
  Filled 2013-04-08: qty 8

## 2013-04-08 MED ORDER — DEXAMETHASONE SODIUM PHOSPHATE 10 MG/ML IJ SOLN
4.0000 mg | Freq: Once | INTRAMUSCULAR | Status: AC
  Administered 2013-04-08: 4 mg via INTRAVENOUS

## 2013-04-08 MED ORDER — SODIUM CHLORIDE 0.9 % IJ SOLN
10.0000 mL | INTRAMUSCULAR | Status: DC | PRN
  Administered 2013-04-08: 10 mL
  Filled 2013-04-08: qty 10

## 2013-04-08 MED ORDER — DEXAMETHASONE SODIUM PHOSPHATE 10 MG/ML IJ SOLN
INTRAMUSCULAR | Status: AC
  Filled 2013-04-08: qty 1

## 2013-04-08 MED ORDER — PACLITAXEL PROTEIN-BOUND CHEMO INJECTION 100 MG
100.0000 mg/m2 | Freq: Once | INTRAVENOUS | Status: AC
  Administered 2013-04-08: 175 mg via INTRAVENOUS
  Filled 2013-04-08: qty 35

## 2013-04-08 MED ORDER — HEPARIN SOD (PORK) LOCK FLUSH 100 UNIT/ML IV SOLN
500.0000 [IU] | Freq: Once | INTRAVENOUS | Status: AC | PRN
  Administered 2013-04-08: 500 [IU]
  Filled 2013-04-08: qty 5

## 2013-04-08 MED ORDER — SODIUM CHLORIDE 0.9 % IV SOLN
Freq: Once | INTRAVENOUS | Status: AC
  Administered 2013-04-08: 13:00:00 via INTRAVENOUS

## 2013-04-08 NOTE — Telephone Encounter (Signed)
Per staff message and POF I have scheduled appts.  JMW  

## 2013-04-08 NOTE — Telephone Encounter (Signed)
, °

## 2013-04-08 NOTE — Progress Notes (Signed)
ID: Regina West   DOB: 06-04-1929  MR#: [id number]  NLZ#:[id number]  PCP: [name], MD GYN: SU: Stephan Minister, MD OTHER MD: Larey Seat, MD  CHIEF COMPLAINT:  Recurrent ovarian cancer  HISTORY OF PRESENT ILLNESS: The patient originally presented in the summer of 2010 with cramps and abdominal distension.  I do not have a copy of the initial evaluation, but on November 26, 2008, the patient underwent optimal debulking with bilateral salpingo-oophorectomy, omentectomy, and the placement of an intraperitoneal port.  The pathology from that procedure (Accession Number [id number] at [facility]) showed first of all, significant involvement of the omentum, minimal involvement of the right ovary and right fallopian tube and negative on the left ovary and fallopian tube, with neither ovary being enlarged, consistent with a primary peritoneal serous adenocarcinoma, described as moderately differentiated.  The sample included no lymph nodes.    The patient had an intraperitoneal port placed in the same surgery and was treated with intraperitoneal and IV chemotherapy according to GOG-252 but I am not sure which arm she was in.  (Arm 2 did carboplatin intraperitoneally and Taxol IV.  Arm 3 did cisplatin and paclitaxel intraperitoneally with paclitaxel IV.)  All arms received bevacizumab.  Unfortunately, after 4 cycles of treatment, she had acute mental status changes and was admitted here January of 2011 with what proved to be posterior reversible leukoencephalopathy and SIADH.  She had seizures, aphasia, and required intubation.  Once the patient recovered from this, she was treated with 2 cycles of single-agent carboplatin (I do not have the AUC). Her last adjuvant treatment was May 12, 2009, and her CA-125 at that time was 10.3.  Her intraperitoneal port was removed in April of 2011.    More recently, the patient was in routine follow up when her CA-125 was found to jump up to 2,269.7 (March 26, 2010).   This is 10 months after her last prior chemotherapy.  She had CTs of the chest, abdomen and pelvis January 26th which showed ascites and enhancing peritoneal nodularity. She had had similar findings at presentation but these had completely resolved by the time she finished treatment in March of 2011. The patient was felt to be in first relapse.  Subsequent treatments are as summarized below   INTERVAL HISTORY:  Regina West returns today with her daughter and daughter-in-law for followup of her recurrent ovarian cancer. She is due for day 8 cycle 4 of Abraxane which she is currently receiving on days 1 and 8 of each 21 day cycle.   REVIEW OF SYSTEMS:  Regina West is tolerating the upper axilla and generally quite well. She does have grade 1 neuropathy, but it has not progressed. She complains of feeling "lethargic". She is very tired all the time, not actually related to her treatments. She does get is a lot of support from family and of course she enjoyed the holidays. She is constipated, and the only thing she does about this is proven juice and try to hydrate. She sleeps poorly. She feels weak. She was not all depressed, but very interactive today, and yet she made numerous statements indicating that she is fairly close to discontinuing treatment and considering hospice. A detailed review of systems today was otherwise noncontributory  PAST MEDICAL HISTORY: Significant for hysterectomy at the age of 58 with concurrent bladder "tuck up."  She also underwent appendectomy during that procedure.  She is status post prior bilateral cataract surgery. She is status post tonsillectomy and adenoidectomy.  She has a history  of hypertension and diabetes. She has a history of diverticulosis.  She has a large hiatal hernia.  She has degenerative disk disease.  She has been noted to have coronary calcifications and she has hyperlipidemia.  There is a history of remote tobacco abuse. Seizure disorder as per HPI  FAMILY  HISTORY The patient's father died at the age of 84 after heart surgery for aortic stenosis.  The patient's mother died at the age of 63.  The patient has one brother in good health.  There is no other family history to her knowledge of ovarian or breast cancer.    GYNECOLOGIC HISTORY: Menarche at age 89.  She is GX P3 with menopause in her late 40s.  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 She is currently residing in [place]. Her husband was in sales and had a history of Parkinson's disease.  He died following a fall in the year 2000.  The patient's son Regina West lives in [place], and has a history of MS.  Daughter Regina West lives in [place]. She is a Agricultural engineer.  Son Regina West lives in [place], [place] and is in Press photographer. The patient has 8 grandchildren. She attends the [employer].  She is a good friend of our patients J. and Regina West DIRECTIVES: in place  HEALTH MAINTENANCE:  (Updated December 2014) History  Substance Use Topics  . Smoking status: Former Smoker    Quit date: 05/23/1964  . Smokeless tobacco: Never Used  . Alcohol Use: No     Colonoscopy: 2008  PAP: UTD  MM:  Declines   Allergies  Allergen Reactions  . Avastin [Bevacizumab] Other (See Comments)    Swelling of the brain     Current Outpatient Prescriptions  Medication Sig Dispense Refill  . acyclovir (ZOVIRAX) 400 MG tablet Take 1 tablet (400 mg total) by mouth 2 (two) times daily.  60 tablet  12  . antiseptic oral rinse (BIOTENE) LIQD 15 mLs by Mouth Rinse route 2 (two) times daily.  1 Bottle  0  . cholecalciferol (VITAMIN D) 1000 UNITS tablet Take 1,000 Units by mouth 2 (two) times daily.      Marland Kitchen levETIRAcetam (KEPPRA) 250 MG tablet Take 1 tablet (250 mg total) by mouth 2 (two) times daily.  60 tablet  0  . nitrofurantoin (MACRODANTIN) 50 MG capsule TAKE  TWO CAPSULES BY MOUTH ONCE DAILY  60 capsule  3  . omeprazole (PRILOSEC) 40 MG capsule Take 1 capsule (40 mg total) by mouth at bedtime.  30 capsule  3  . ondansetron (ZOFRAN) 8 MG tablet Take 1 tablet (8 mg total) by mouth 2 (two) times daily. Take two times a day starting the day after chemo for 2 days. Then take two times a day as needed for nausea or vomiting.  30 tablet  1  . prochlorperazine (COMPAZINE) 10 MG tablet Take 1 tablet (10 mg total) by mouth every 6 (six) hours as needed (Nausea or vomiting).  30 tablet  1  . VIIBRYD 40 MG TABS        No current facility-administered medications for this visit.   Facility-Administered Medications Ordered in Other Visits  Medication Dose Route Frequency Provider Last Rate Last Dose  . heparin lock flush 100 unit/mL  500 Units Intracatheter Once PRN Amy Milda Smart, PA-C      .  PACLitaxel-protein bound (ABRAXANE) chemo infusion 175 mg  100 mg/m2 (Treatment Plan Actual) Intravenous Once Theotis Burrow, PA-C 70 mL/hr at 04/08/13 1356 175 mg at 04/08/13 1356  . sodium chloride 0.9 % injection 10 mL  10 mL Intracatheter PRN Amy Milda Smart, PA-C        OBJECTIVE: Elderly white woman in no acute distress  Filed Vitals:   04/08/13 1150  BP: 114/67  Pulse: 85  Temp: 98.8 F (37.1 C)  Resp: 18     Body mass index is 26.9 kg/(m^2).    ECOG FS: 1 Filed Weights   04/08/13 1150  Weight: 166 lb 9.6 oz (75.569 kg)   Sclerae unicteric, pupils equal and round Oropharynx clear and moist-- no thrush noted No cervical or supraclavicular adenopathy Lungs no rales or rhonchi, fair to poor excursion bilaterally Heart regular rate and rhythm Abd soft, nontender, positive bowel sounds, no masses palpated MSK no focal spinal tenderness, no upper extremity lymphedema Neuro: nonfocal, well oriented, appropriate affect Breasts: Deferred   LAB RESULTS: Results for ZAIRAH, STOCKEL (MRN [id number]) as of 04/08/2013 14:10  Ref. Range 01/23/2013 13:17 02/04/2013 12:36  03/04/2013 12:26 03/22/2013 10:23 04/01/2013 10:49  CA 125 Latest Range: 0.0-30.2 U/mL 200.6 (H) 93.8 (H) 37.7 (H) 23.7 24.8    CA 125   23.7  03/22/2013    37.7  03/04/2013    93.8  02/04/2013    200.6  01/23/2013    271.0  01/11/2013    245.0  01/07/2013     18.7  11/19/2012    Lab Results  Component Value Date   WBC 4.2 04/08/2013   NEUTROABS 3.3 04/08/2013   HGB 9.7* 04/08/2013   HCT 29.7* 04/08/2013   MCV 100.0 04/08/2013   PLT 137* 04/08/2013      Chemistry      Component Value Date/Time   NA 140 04/08/2013 1117   NA 136 10/17/2011 1118   NA 143 05/12/2011 1337   K 4.3 04/08/2013 1117   K 3.9 10/17/2011 1118   K 4.1 05/12/2011 1337   CL 100 08/20/2012 1124   CL 100 10/17/2011 1118   CL 92* 05/12/2011 1337   CO2 26 04/08/2013 1117   CO2 27 10/17/2011 1118   CO2 32 05/12/2011 1337   West 26.6* 04/08/2013 1117   West 25* 10/17/2011 1118   West 19 05/12/2011 1337   CREATININE 1.0 04/08/2013 1117   CREATININE 1.10 10/17/2011 1118   CREATININE 1.3* 05/12/2011 1337      Component Value Date/Time   CALCIUM 9.7 04/08/2013 1117   CALCIUM 9.8 10/17/2011 1118   CALCIUM 9.8 05/12/2011 1337   ALKPHOS 95 04/08/2013 1117   ALKPHOS 109 10/17/2011 1118   ALKPHOS 95* 05/12/2011 1337   AST 17 04/08/2013 1117   AST 22 10/17/2011 1118   AST 40* 05/12/2011 1337   ALT 19 04/08/2013 1117   ALT 14 10/17/2011 1118   ALT 28 05/12/2011 1337   BILITOT 0.42 04/08/2013 1117   BILITOT 0.4 10/17/2011 1118   BILITOT 0.60 05/12/2011 1337      STUDIES: No results found.   ASSESSMENT: 80 y.o.  [place] woman   (1) status post optimal debulking of a primary peritoneal serous adenocarcinoma September 2010, the tumor being moderately differentiated, pT3c NX (stage IIIC), treated according to GOG 252 with  intraperitoneal platinum and paclitaxel x4 given along with bevacizumab, complicated by SIADHafter cycle 4, also with posterior reversible leukoencephalopathy.  After these problems resolved she received 2  additional cycles of single-agent  carboplatin completed in March 2011  (2)  She had her first recurrence February 2012  treated with carboplatin and Gemzar for a total of 6 cycles between February and June 2012.  Her CEA-125 had normalized before the beginning of cycle 5.   (3) second recurrence documented February 2013,  treated with carboplatin/ doxil x 6, completed 10/03/2011, with a good response noted on restaging scans  (4) third recurrence documented February 2014, receiving single-agent carboplatin every 3 weeks, first dose 04/30/2012, completed 08/20/2012  (5) fourth recurrence documented by abdominal symptoms and a rising CA 125 November 2014, receiving Abraxane on days one and 8 of each 21 day cycle, with first dose given on 01/16/2013.  PLAN:  Regina West is tolerating the Abraxane well. She has normalized her CA 125. Normally what I would do at this point is continue through 2 more cycles, and then perhaps obtained a CT scan.  However Regina West is "thinking about letting go". She has been a widow 17 years. She lost her mother 15 years ago. She has had 15 years " just to be me", and she appreciates that, but " this is not a life.". After much discussion, we decided after today's treatment we would change her Abraxane to every 2 weeks and we will continue to follow her labwork. At some point her CA 125 will start rising, and when there is a clear upward trend we will stop treatment and get hospice involved.  Otherwise I am continuing the Abraxane at the same dose as before, since she tolerates it well and keeps a good set of counts. We discussed the constipation issue in detail, and she will take stool softeners 2 tablets daily. After a week if that does not solve the problem she will take 2 tablets twice daily, and after another week if that does not take care of the problem she will take MiraLAX daily.  Regina West knows to call for any problems that may develop before her next visit here. Note that she really has advanced  directives in place.  Chauncey Cruel, MD      04/08/2013

## 2013-04-08 NOTE — Patient Instructions (Signed)
[facility] Discharge Instructions for Patients Receiving Chemotherapy  Today you received the following chemotherapy agents: abraxane  To help prevent nausea and vomiting after your treatment, we encourage you to take your nausea medication.  Take it as often as prescribed.     If you develop nausea and vomiting that is not controlled by your nausea medication, call the clinic. If it is after clinic hours your family physician or the after hours number for the clinic or go to the Emergency Department.   BELOW ARE SYMPTOMS THAT SHOULD BE REPORTED IMMEDIATELY:  *FEVER GREATER THAN 100.5 F  *CHILLS WITH OR WITHOUT FEVER  NAUSEA AND VOMITING THAT IS NOT CONTROLLED WITH YOUR NAUSEA MEDICATION  *UNUSUAL SHORTNESS OF BREATH  *UNUSUAL BRUISING OR BLEEDING  TENDERNESS IN MOUTH AND THROAT WITH OR WITHOUT PRESENCE OF ULCERS  *URINARY PROBLEMS  *BOWEL PROBLEMS  UNUSUAL RASH Items with * indicate a potential emergency and should be followed up as soon as possible.  Feel free to call the clinic you have any questions or concerns. The clinic phone number is [phone].   I have been informed and understand all the instructions given to me. I know to contact the clinic, my physician, or go to the Emergency Department if any problems should occur. I do not have any questions at this time, but understand that I may call the clinic during office hours   should I have any questions or need assistance in obtaining follow up care.    __________________________________________  _____________  __________ Signature of Patient or Authorized Representative            Date                   Time    __________________________________________ Nurse's Signature   

## 2013-04-17 ENCOUNTER — Ambulatory Visit (INDEPENDENT_AMBULATORY_CARE_PROVIDER_SITE_OTHER): Payer: Medicare Other | Admitting: Neurology

## 2013-04-17 ENCOUNTER — Encounter: Payer: Self-pay | Admitting: Neurology

## 2013-04-17 VITALS — BP 140/85 | HR 94 | Resp 16 | Ht 66.0 in | Wt 167.0 lb

## 2013-04-17 DIAGNOSIS — G629 Polyneuropathy, unspecified: Secondary | ICD-10-CM

## 2013-04-17 DIAGNOSIS — G119 Hereditary ataxia, unspecified: Secondary | ICD-10-CM

## 2013-04-17 DIAGNOSIS — D849 Immunodeficiency, unspecified: Secondary | ICD-10-CM

## 2013-04-17 DIAGNOSIS — R413 Other amnesia: Secondary | ICD-10-CM

## 2013-04-17 DIAGNOSIS — C801 Malignant (primary) neoplasm, unspecified: Secondary | ICD-10-CM

## 2013-04-17 DIAGNOSIS — G63 Polyneuropathy in diseases classified elsewhere: Secondary | ICD-10-CM

## 2013-04-17 DIAGNOSIS — G609 Hereditary and idiopathic neuropathy, unspecified: Secondary | ICD-10-CM

## 2013-04-17 DIAGNOSIS — G3189 Other specified degenerative diseases of nervous system: Secondary | ICD-10-CM

## 2013-04-17 DIAGNOSIS — G130 Paraneoplastic neuromyopathy and neuropathy: Secondary | ICD-10-CM

## 2013-04-17 DIAGNOSIS — C569 Malignant neoplasm of unspecified ovary: Secondary | ICD-10-CM

## 2013-04-17 MED ORDER — LEVETIRACETAM 250 MG PO TABS: 250.0000 mg | ORAL_TABLET | Freq: Two times a day (BID) | ORAL | Status: DC

## 2013-04-17 NOTE — Progress Notes (Signed)
[facility]  Provider:  Larey Seat, M D  Referring Provider: [name], MD Primary Care Physician:  [name], MD  Chief Complaint  Patient presents with  . Follow-up    Room 10  . Seizures    MOCA 14/30    HPI:  Regina West is a 80 y.o. female  Is seen here as a prolonged  revisit  from Dr. Reynaldo Minium for follow up after Hyponatremic confusion and convulsion in the setting of ovarian cancer.  I encountered Regina West first during a hospitalization. He had undergone surgery for ovarian cancer followed by intermittent chemotherapies.  One of these caused her to develop hypernatremia, and  in return she became confused and developed an epileptic status.  Her MRI showed signs of posterior reversible encephalopathy in the patient but had not had a history of hypertension before.  She carries the diagnosis of an adenocarcinoma , metastatic at the time of discovery,  and has been followed by Dr. Truman Hayward at [facility] in intervals.  She has tolerated Keppra an anti seizure medicine rather well but remained on it  because her MRI and never recovered quite to baseline.  When I last the patient in 2013 her Mini-Mental status test scored 30 out of 30 points and her clock drawing was full as well as abstract thinking she also generated 15 animal names.  She is taking Keppra now at a lowish dose of 250 mg twice a day and feels that it has had a lower doors less impact on her of feeling depressed or moody. She does voice some memory concerns.  She resides at [facility] ,  widowed , enjoyes the community. Keeps her private home. Today she performed a more cut test and Montral cognitive assessment test and scored only 14 of 30 points,  on the geriatric depression score  she endorsed 9 points. She has progressive word finding difficulties.        Review of Systems: Out of a complete 14 system review, the patient complains of only the following symptoms, and all  other reviewed systems are negative. Memory loss,   History   Social History  . Marital Status: Widowed    Spouse Name: N/A    Number of Children: 3  . Years of Education: College   Occupational History  . Not on file.   Social History Main Topics  . Smoking status: Former Smoker    Quit date: 05/23/1964  . Smokeless tobacco: Never Used  . Alcohol Use: 0.0 oz/week     Comment: rarely  . Drug Use: No  . Sexual Activity: No   Other Topics Concern  . Not on file   Social History Narrative   Patient is widowed.   Patient has three children.   Patient is right-handed.   Patient drinks four cups of caffeine beverages daily.   Patient is retired.   Patient has a college education.    Family History  Problem Relation Age of Onset  . Heart Problems Father     aortic stenosis  . Diabetes      nephew  . Emphysema Father   . Depression Brother     manic  . Multiple sclerosis Son     Past Medical History  Diagnosis Date  . Hypertension   . Hyperlipidemia   . Seizure disorder   . Hiatal hernia   . Increased glucose level   . Cancer     ovarian, peritoneal serous adenocarcin  .  Ovarian cancer   . Seizures   . Allergy   . Anemia     Past Surgical History  Procedure Laterality Date  . Abdominal surgery    . Power port placement    . Abdominal hysterectomy      [facility]  . Appendectomy    . Cataract extraction, bilateral      Current Outpatient Prescriptions  Medication Sig Dispense Refill  . acyclovir (ZOVIRAX) 400 MG tablet Take 1 tablet (400 mg total) by mouth 2 (two) times daily.  60 tablet  12  . antiseptic oral rinse (BIOTENE) LIQD 15 mLs by Mouth Rinse route 2 (two) times daily.  1 Bottle  0  . cholecalciferol (VITAMIN D) 1000 UNITS tablet Take 1,000 Units by mouth 2 (two) times daily.      Marland Kitchen. levETIRAcetam (KEPPRA) 250 MG tablet Take 1 tablet (250 mg total) by mouth 2 (two) times daily.  60 tablet  0  . nitrofurantoin  (MACRODANTIN) 50 MG capsule TAKE TWO CAPSULES BY MOUTH ONCE DAILY  60 capsule  3  . omeprazole (PRILOSEC) 40 MG capsule Take 1 capsule (40 mg total) by mouth at bedtime.  30 capsule  3  . ondansetron (ZOFRAN) 8 MG tablet Take 1 tablet (8 mg total) by mouth 2 (two) times daily. Take two times a day starting the day after chemo for 2 days. Then take two times a day as needed for nausea or vomiting.  30 tablet  1  . VIIBRYD 40 MG TABS        No current facility-administered medications for this visit.    Allergies as of 04/17/2013 - Review Complete 04/17/2013  Allergen Reaction Noted  . Avastin [bevacizumab] Other (See Comments) 05/23/2011    Vitals: BP 140/85  Pulse 94  Resp 16  Ht 5\' 6"  (1.676 m)  Wt 167 lb (75.751 kg)  BMI 26.97 kg/m2 Last Weight:  Wt Readings from Last 1 Encounters:  04/17/13 167 lb (75.751 kg)   Last Height:   Ht Readings from Last 1 Encounters:  04/17/13 5\' 6"  (1.676 m)   Family history , Her son an has primary progressive MS>      Physical exam:  General: The patient is awake, alert and appears not in acute distress. The patient is well groomed. Head: Normocephalic, atraumatic. Neck is supple. Mallampati2, neck circumference:1 Cardiovascular:  Regular rate and rhythm, without  murmurs or carotid bruit, and without distended neck veins. Respiratory: Lungs are clear to auscultation. Skin:  Without evidence of edema, or rash Trunk: BMI is elevated and patient  has normal posture.  Neurologic exam : The patient is awake and alert, oriented to place and time.  Memory MOCA of 14 is significantly reduced.  There is a normal attention span & concentration ability. Speech is fluent without dysarthria, dysphonia but some  Aphasia. She forms neologisms ! Mood and affect are appropriate.  Cranial nerves: Pupils are equal and briskly reactive to light. Funduscopic exam without  evidence of pallor or edema. Extraocular movements   horizontal gaze  nystagmus with  gaze to the left and restrictions in gaze to the right, upward gaze is nystagmic, too, fine beats, not opsoclonus, no irregular beats. Cerebellar or midbrain lesion?  Visual fields by finger perimetry are intact. Hearing to finger rub intact.  Facial sensation intact to fine touch. Facial motor strength is symmetric and tongue and uvula move midline.  Motor exam:  Normal tone and normal muscle bulk and symmetric  normal strength in all extremities.  Sensory:  Fine touch, pinprick and vibration were tested in all extremities.  Proprioception : complete loss of vibration in toes and forefoot.  Coordination: Rapid alternating movements in the fingers/hands with past pointing and satelliting, cerebellar ataxia . Finger-to-nose maneuver :evidence of ataxia, dysmetria . Gait and stance ataxic.   Deep tendon reflexes: in the  lower extremities are absent  Babinski maneuver response is absent .   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  Loss of memory ( MOCA 14 out of 30). No recurrent seizures.    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  I will ask Dr. Jana Hakim if these can be obtained through the cancer center, if he prefers [place] to do it, will do .  Keppra refilled, it doesn't lower natrium levels.  Rv every  6 month alternating between NP and me. Natrium levels to be evaluated in 6 month.

## 2013-04-17 NOTE — Patient Instructions (Addendum)
Paraneoplastic Syndromes Paraneoplastic syndromes are a group of rare disorders triggered by a person's immune system in response to a cancerous tumor.  CAUSES Neurologic paraneoplastic syndromes are believed to occur when cancer-fighting antibodies (white blood cells known as T cells) attack normal cells by mistake in the nervous system. These disorders often affect middle-aged to elderly people. They are most common in people with lung, ovarian, lymphatic, or breast cancer. SYMPTOMS  Symptoms generally develop over a period of days to weeks. They usually occur prior to tumor detection, which can complicate the diagnosis. Symptoms include:  Difficulty with walking.  Difficulty with swallowing.  Loss of muscle tone.  Sleep disturbances.  Dementia.  Dizziness (vertigo).  Loss of fine motor coordination.  Slurred speech.  Memory loss.  Vision problems.  Seizures.  Loss of feeling in the limbs. DIAGNOSIS Diagnostic tests will depend on your symptoms and the particular syndrome your caregiver thinks you may have. Testing may include blood tests, procedures such as lumbar puncture, and radiology tests such as magnetic resonance imaging (MRI). TREATMENT  The cancer is treated first. Then, efforts are made to decrease the autoimmune response. This may be done with:  Steroids (cortisone or prednisone).  High-dose immunoglobulin.  Irradiation. Blood cleansing (plasmapheresis) may ease symptoms in patients with paraneoplastic disorders. These disorders affect the peripheral nervous system. Speech and physical therapy may help patients regain some functions. There are no cures for these syndromes. HOME CARE INSTRUCTIONS Follow the instructions provided by your caregiver. SEEK IMMEDIATE MEDICAL CARE IF: You have any of the symptoms your caregiver has told you are an emergency. These symptoms will be specific to your cancer and paraneoplastic syndrome. Document Released: 02/11/2002  Document Revised: 05/16/2011 Document Reviewed: 07/12/2010 [facility] Patient Information 2014 [place]. Aphasia Aphasia is a neurological disorder caused by damage to the parts of the brain that control language. CAUSES  Aphasia is not a disease, but a symptom of brain damage. Aphasia is commonly seen in adults who have suffered a stroke. Aphasia also can result from:  A brain tumor.  Infection.  Head injury.  A rare type of dementia called Primary Progressive Aphasia. Common types of dementia may be associated with aphasia but can also exist without language problems. SYMPTOMS  Primary signs of the disorder include:  Problems expressing oneself when speaking.  Trouble understanding speech.  Difficulty with reading and writing.  Speaking in short or incomplete sentences.  Speaking in sentences that don't make sense.  Speaking unrecognizable words.  Interpreting figurative language literally.  Writing sentences that don't make sense. The type and severity of language problems depend on the precise location and extent of the damaged brain tissue. Aphasia can be divided into four broad categories:  Expressive aphasia - difficulty in conveying thoughts through speech or writing. The patient knows what they want to say, but cannot find the words they need.  Receptive aphasia - difficulty understanding spoken or written language. The patient hears the voice or sees the print but cannot make sense of the words.  Anomic or amnesia aphasia - difficulty in using the correct names for particular objects, people, places, or events. This is the least severe form of aphasia.  Global aphasia results from severe and extensive damage to the language areas of the brain. Patients lose almost all language function, both comprehension (understanding) and expression. They cannot speak, understand speech, read, or write. TREATMENT  Sometimes an individual will completely recover from  aphasia without treatment. In most cases, language therapy should begin  as soon as possible. Language therapy should be tailored to the individual needs of the patient. Therapy with a speech pathologist involves exercises in which patients:  Read.  Write.  Follow directions.  Repeat what they hear.  Computer-aided therapy may also be used. PROGNOSIS  The outcome of aphasia is difficult to predict. People who are younger or have less extensive brain damage do better. The location of the injury is also important. The location is a clue to prognosis. In general, patients tend to recover skills in language comprehension (understanding) more completely than those skills involving expression (speaking or writing). Document Released: 11/13/2001 Document Revised: 05/16/2011 Document Reviewed: 01/09/2007 [facility] Patient Information 2014 [place], [place].

## 2013-04-19 ENCOUNTER — Telehealth: Payer: Self-pay | Admitting: Neurology

## 2013-04-19 NOTE — Telephone Encounter (Signed)
PT's daughter-in-law, Regina West, called.  She stated that Regina West was confused about what was discussed during her visit on 2-11.  Regina West stated that she normally tries to come with Regina West, but was unable to that day.  If someone could please call and discuss what happened during that visit with Regina West she can relay to Regina West.  Please call Regina West directly at [phone].  Thank you.

## 2013-04-19 NOTE — Telephone Encounter (Signed)
Spoke with Sherrie and she would like a copy of the AVS summary mailed to [place], [place] [phone] but also would like Dr Brett Fairy to call her to discuss the OV from 04/17/13

## 2013-04-22 ENCOUNTER — Ambulatory Visit (HOSPITAL_BASED_OUTPATIENT_CLINIC_OR_DEPARTMENT_OTHER): Payer: Medicare Other

## 2013-04-22 ENCOUNTER — Other Ambulatory Visit (HOSPITAL_BASED_OUTPATIENT_CLINIC_OR_DEPARTMENT_OTHER): Payer: Medicare Other

## 2013-04-22 VITALS — BP 148/71 | HR 67 | Temp 97.6°F | Resp 18

## 2013-04-22 DIAGNOSIS — C569 Malignant neoplasm of unspecified ovary: Secondary | ICD-10-CM

## 2013-04-22 DIAGNOSIS — Z5111 Encounter for antineoplastic chemotherapy: Secondary | ICD-10-CM

## 2013-04-22 DIAGNOSIS — C482 Malignant neoplasm of peritoneum, unspecified: Secondary | ICD-10-CM

## 2013-04-22 LAB — COMPREHENSIVE METABOLIC PANEL (CC13)
ALBUMIN: 3.4 g/dL — AB (ref 3.5–5.0)
ALT: 15 U/L (ref 0–55)
AST: 19 U/L (ref 5–34)
Alkaline Phosphatase: 91 U/L (ref 40–150)
Anion Gap: 8 mEq/L (ref 3–11)
BUN: 25.1 mg/dL (ref 7.0–26.0)
CALCIUM: 9.6 mg/dL (ref 8.4–10.4)
CHLORIDE: 101 meq/L (ref 98–109)
CO2: 25 mEq/L (ref 22–29)
Creatinine: 1 mg/dL (ref 0.6–1.1)
GLUCOSE: 121 mg/dL (ref 70–140)
POTASSIUM: 4.5 meq/L (ref 3.5–5.1)
Sodium: 134 mEq/L — ABNORMAL LOW (ref 136–145)
Total Bilirubin: 0.3 mg/dL (ref 0.20–1.20)
Total Protein: 5.8 g/dL — ABNORMAL LOW (ref 6.4–8.3)

## 2013-04-22 LAB — CBC WITH DIFFERENTIAL/PLATELET
BASO%: 1 % (ref 0.0–2.0)
BASOS ABS: 0 10*3/uL (ref 0.0–0.1)
EOS ABS: 0.1 10*3/uL (ref 0.0–0.5)
EOS%: 3.9 % (ref 0.0–7.0)
HEMATOCRIT: 29 % — AB (ref 34.8–46.6)
HEMOGLOBIN: 9.3 g/dL — AB (ref 11.6–15.9)
LYMPH#: 0.7 10*3/uL — AB (ref 0.9–3.3)
LYMPH%: 21.4 % (ref 14.0–49.7)
MCH: 32.1 pg (ref 25.1–34.0)
MCHC: 32.1 g/dL (ref 31.5–36.0)
MCV: 100 fL (ref 79.5–101.0)
MONO#: 0.9 10*3/uL (ref 0.1–0.9)
MONO%: 27.5 % — ABNORMAL HIGH (ref 0.0–14.0)
NEUT#: 1.4 10*3/uL — ABNORMAL LOW (ref 1.5–6.5)
NEUT%: 46.2 % (ref 38.4–76.8)
Platelets: 204 10*3/uL (ref 145–400)
RBC: 2.9 10*6/uL — ABNORMAL LOW (ref 3.70–5.45)
RDW: 15.7 % — ABNORMAL HIGH (ref 11.2–14.5)
WBC: 3.1 10*3/uL — AB (ref 3.9–10.3)
nRBC: 0 % (ref 0–0)

## 2013-04-22 MED ORDER — DEXAMETHASONE SODIUM PHOSPHATE 10 MG/ML IJ SOLN
INTRAMUSCULAR | Status: AC
Start: 2013-04-22 — End: 2013-04-22
  Filled 2013-04-22: qty 1

## 2013-04-22 MED ORDER — SODIUM CHLORIDE 0.9 % IV SOLN
Freq: Once | INTRAVENOUS | Status: AC
  Administered 2013-04-22: 13:00:00 via INTRAVENOUS

## 2013-04-22 MED ORDER — ALTEPLASE 2 MG IJ SOLR
2.0000 mg | Freq: Once | INTRAMUSCULAR | Status: AC | PRN
  Administered 2013-04-22: 2 mg
  Filled 2013-04-22: qty 2

## 2013-04-22 MED ORDER — SODIUM CHLORIDE 0.9 % IJ SOLN
10.0000 mL | INTRAMUSCULAR | Status: DC | PRN
  Administered 2013-04-22: 10 mL
  Filled 2013-04-22: qty 10

## 2013-04-22 MED ORDER — PACLITAXEL PROTEIN-BOUND CHEMO INJECTION 100 MG
100.0000 mg/m2 | Freq: Once | INTRAVENOUS | Status: AC
  Administered 2013-04-22: 200 mg via INTRAVENOUS
  Filled 2013-04-22: qty 40

## 2013-04-22 MED ORDER — ONDANSETRON 8 MG/50ML IVPB (CHCC)
8.0000 mg | Freq: Once | INTRAVENOUS | Status: AC
  Administered 2013-04-22: 8 mg via INTRAVENOUS

## 2013-04-22 MED ORDER — DEXAMETHASONE SODIUM PHOSPHATE 10 MG/ML IJ SOLN
10.0000 mg | Freq: Once | INTRAMUSCULAR | Status: AC
  Administered 2013-04-22: 10 mg via INTRAVENOUS

## 2013-04-22 MED ORDER — ONDANSETRON 8 MG/NS 50 ML IVPB
INTRAVENOUS | Status: AC
  Filled 2013-04-22: qty 8

## 2013-04-22 MED ORDER — HEPARIN SOD (PORK) LOCK FLUSH 100 UNIT/ML IV SOLN
500.0000 [IU] | Freq: Once | INTRAVENOUS | Status: AC | PRN
  Administered 2013-04-22: 500 [IU]
  Filled 2013-04-22: qty 5

## 2013-04-22 NOTE — Patient Instructions (Signed)
[facility] Discharge Instructions for Patients Receiving Chemotherapy  Today you received the following chemotherapy agents :  Abraxane.  To help prevent nausea and vomiting after your treatment, we encourage you to take your nausea medication as instructed by your physician.   If you develop nausea and vomiting that is not controlled by your nausea medication, call the clinic.   BELOW ARE SYMPTOMS THAT SHOULD BE REPORTED IMMEDIATELY:  *FEVER GREATER THAN 100.5 F  *CHILLS WITH OR WITHOUT FEVER  NAUSEA AND VOMITING THAT IS NOT CONTROLLED WITH YOUR NAUSEA MEDICATION  *UNUSUAL SHORTNESS OF BREATH  *UNUSUAL BRUISING OR BLEEDING  TENDERNESS IN MOUTH AND THROAT WITH OR WITHOUT PRESENCE OF ULCERS  *URINARY PROBLEMS  *BOWEL PROBLEMS  UNUSUAL RASH Items with * indicate a potential emergency and should be followed up as soon as possible.  Feel free to call the clinic you have any questions or concerns. The clinic phone number is [phone].    

## 2013-04-22 NOTE — Progress Notes (Signed)
1205  -   Right chest portacath accessed with 20x1 HN without difficulty.  Flushed easily with 10cc normal saline with no blood return.  Alteplase instilled into port after attempted several different positions unsuccessfully. 1235  -  Approx. 7cc of blood obtained from portacath and wasted.  Blood drawn for labs.  IVF up for chemo.  OK to treat with ANC  1.4  As per Micah Flesher,  PA.

## 2013-04-23 LAB — CA 125: CA 125: 30.8 U/mL — AB (ref 0.0–30.2)

## 2013-04-23 NOTE — Telephone Encounter (Signed)
Spoke to daughter in law, she agrees to have an MRI done.

## 2013-04-24 IMAGING — CT CT ABD-PELV W/ CM
1 of 3 series · 14 of 32 positions shown, 19 images · IV contrast (OMNIPAQUE)
Comparison: 07/25/2011.

CLINICAL DATA: Ovarian cancer.  Restaging.  Ongoing chemotherapy.
Hysterectomy.  Appendectomy.

CT ABDOMEN AND PELVIS WITH CONTRAST
TECHNIQUE: Multidetector CT imaging of the abdomen and pelvis was
performed following the standard protocol during bolus
administration of intravenous contrast.
Contrast: 100mL OMNIPAQUE IOHEXOL 300 MG/ML  SOLN

[Series 2: rtn ap with st · axial · 0.74mm/px · z∈[+1255,+1630]mm · 14 of 85 slices shown, 19 images]
[im 5/85  soft-tissue]
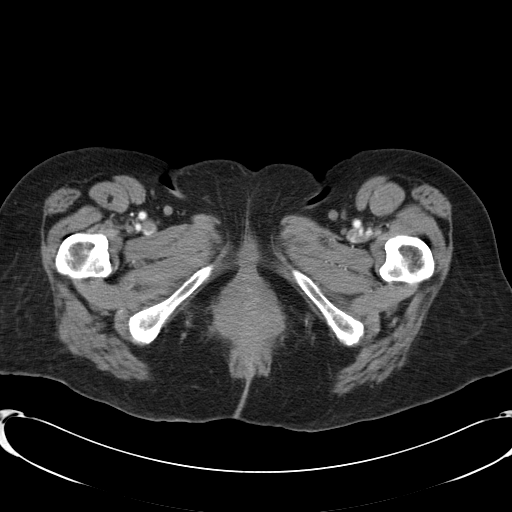
[im 5/85  bone]
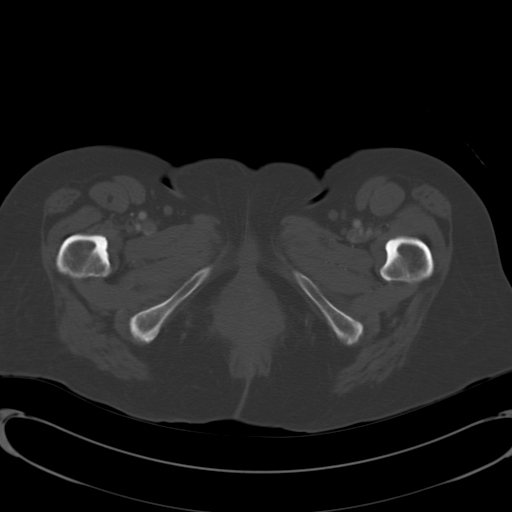
[im 14/85  soft-tissue]
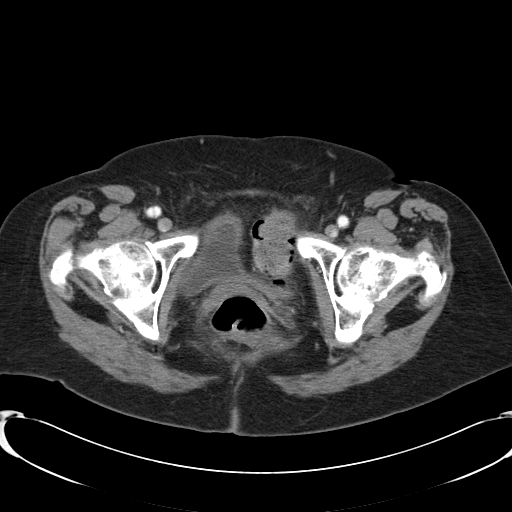
[im 18/85  soft-tissue]
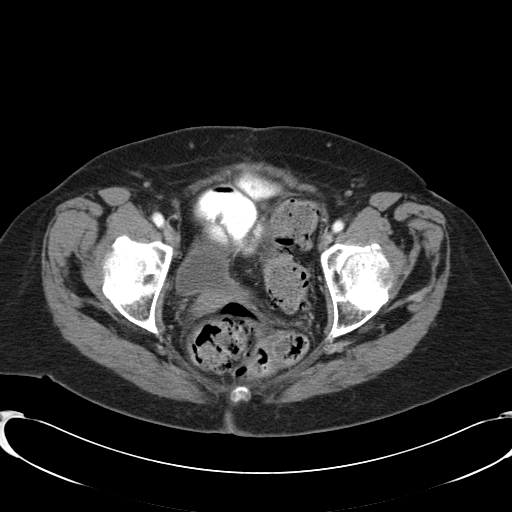
[im 23/85  soft-tissue]
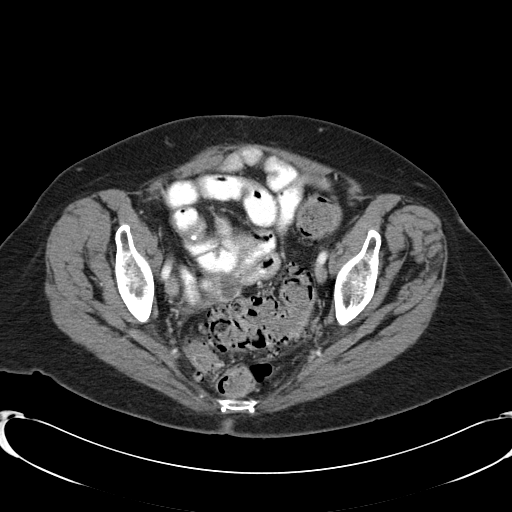
[im 31/85  soft-tissue]
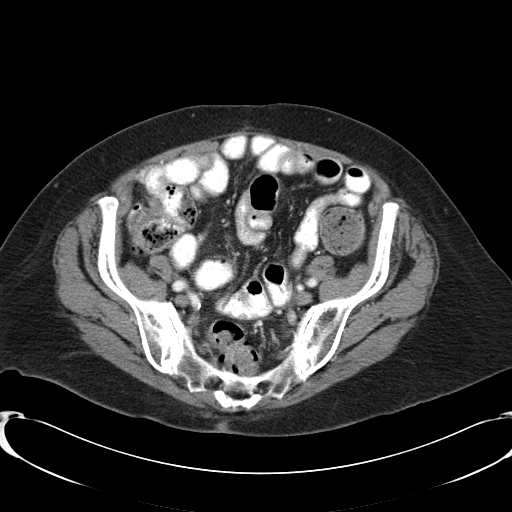
[im 36/85  soft-tissue]
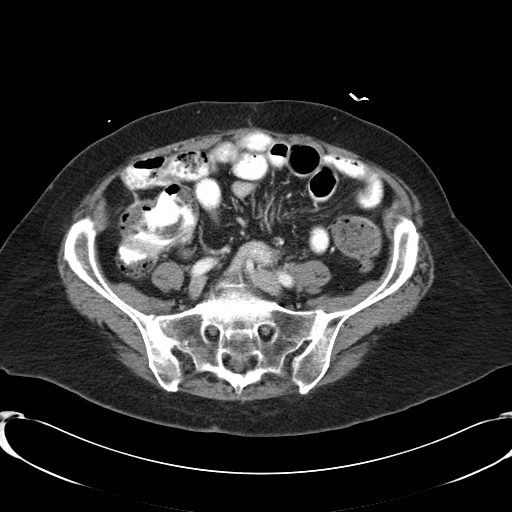
[im 45/85  soft-tissue]
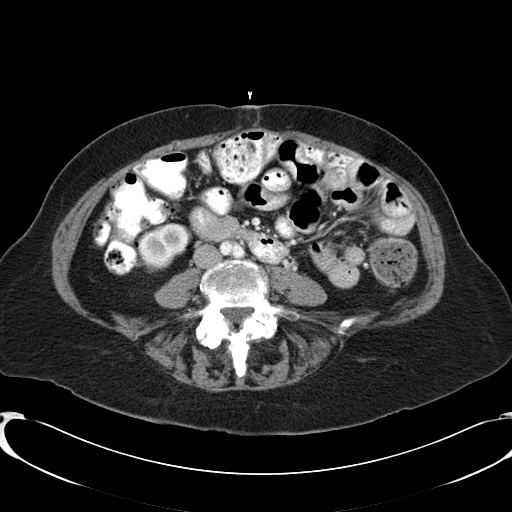
[im 49/85  soft-tissue]
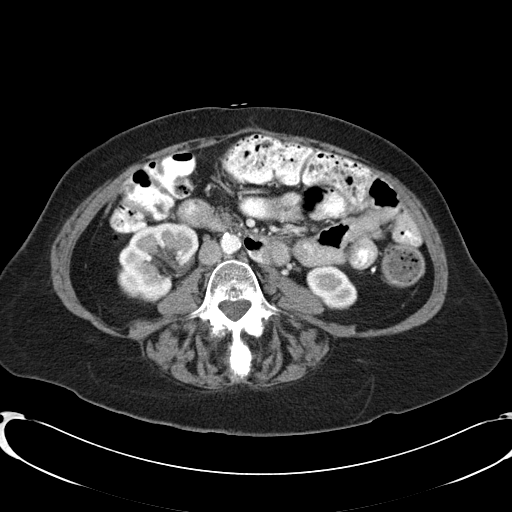
[im 54/85  soft-tissue]
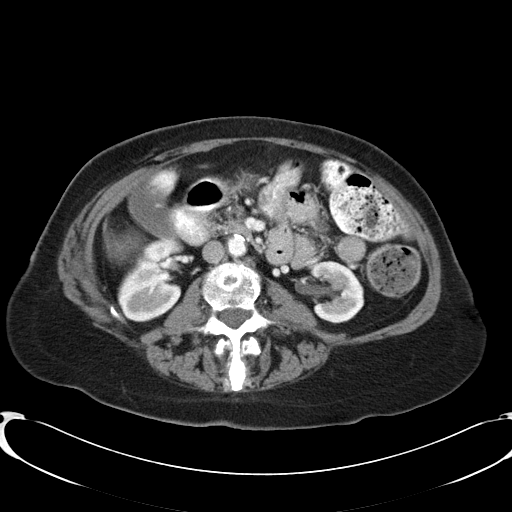
[im 54/85  bone]
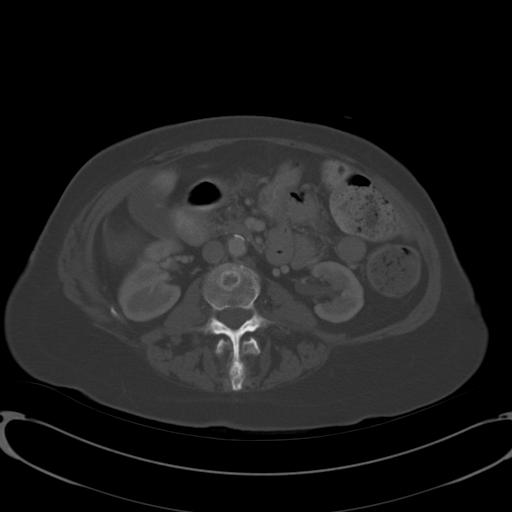
[im 62/85  soft-tissue]
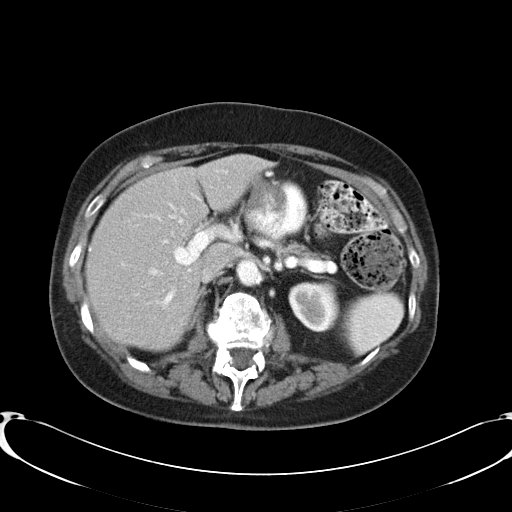
[im 67/85  soft-tissue]
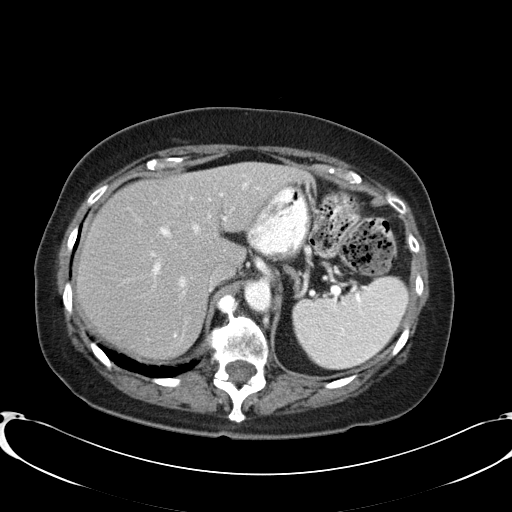
[im 67/85  lung]
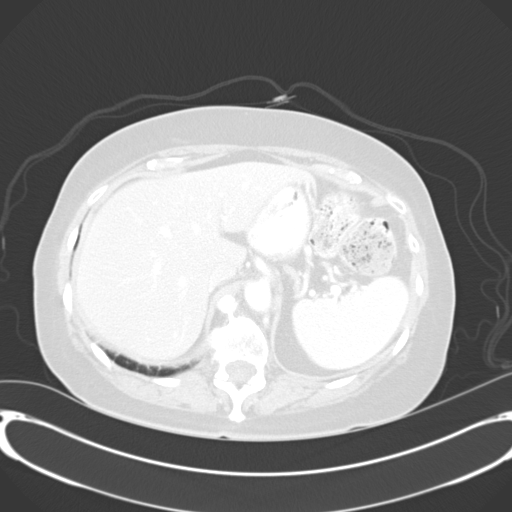
[im 71/85  soft-tissue]
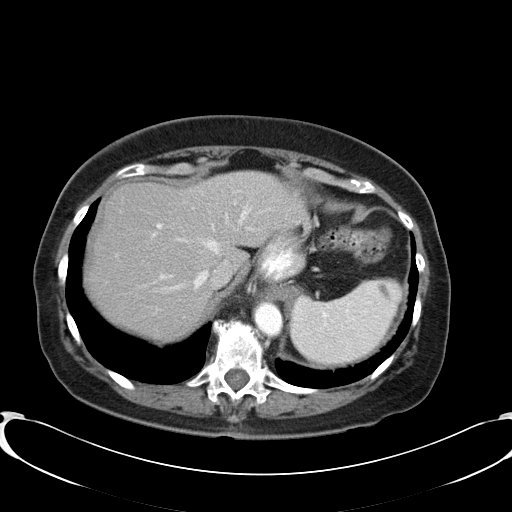
[im 71/85  lung]
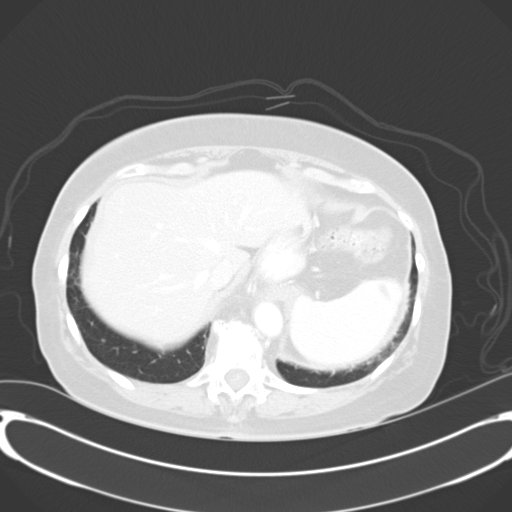
[im 76/85  lung]
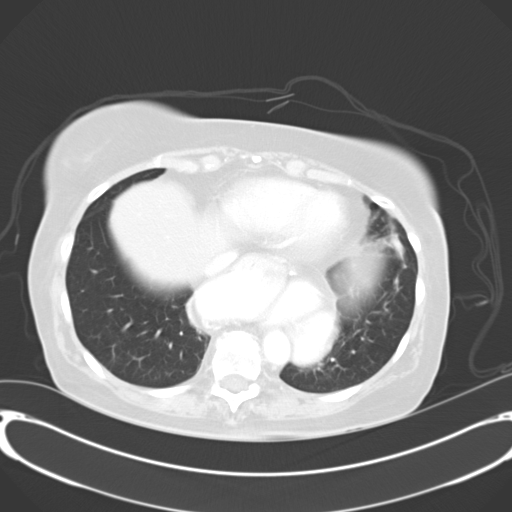
[im 80/85  soft-tissue]
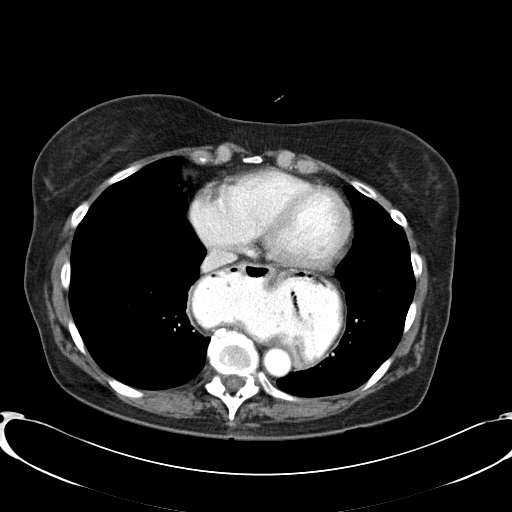
[im 80/85  lung]
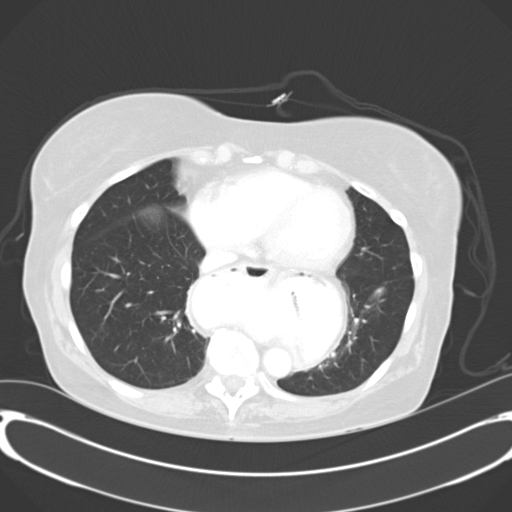

[14 of 32 positions shown; findings below may reference images not displayed]

FINDINGS: Clear lung bases.  Mild cardiomegaly.  Moderate hiatal
hernia. No pericardial or pleural effusion.  The capsular based low
density hepatic focus is no longer identified.  No parenchymal
liver lesions.

Normal spleen.  Gastric body appears thick-walled on image 28,
favored to be due to underdistension.  Normal pancreas,
gallbladder, biliary tract, adrenal glands, kidneys.  Small
retroperitoneal nodes are stable.  There is a focus of soft tissue
density along the left periaortic region at the level of the left
hemidiaphragm.  This measures 1.0 cm on image 20 and is felt to be
similar.  No retrocrural adenopathy.

Colonic stool burden suggests constipation.

Scattered colonic diverticula.  Normal terminal ileum.  Normal
caliber of small bowel loops.  No ascites.  No omental nodularity.

Low density focus in the right hemi pelvis measures 2.3 x 1.1 cm on
image 64 versus 3.8 x 2.0 cm on the prior exam.  No pelvic sidewall
adenopathy.  Normal urinary bladder.  Hysterectomy. No adnexal mass
or significant free fluid.  No acute osseous abnormality.  Marked
thoracolumbar spondylosis with areas of likely secondary trace
malalignment.
IMPRESSION: 1.  Resolution of capsular based low density perihepatic lesion,
likely representing response to therapy.
2.  A low density focus in the right hemi pelvis is also slightly
decreased likely represents response to therapy.
3.  No new or progressive disease identified.
4. Possible constipation.
5.  Similar large hiatal hernia.
6.  Soft tissue density along the left periaortic space at the
level of the left hemidiaphragm is similar and warrants followup
attention.

## 2013-05-06 ENCOUNTER — Ambulatory Visit (HOSPITAL_BASED_OUTPATIENT_CLINIC_OR_DEPARTMENT_OTHER): Payer: Medicare Other

## 2013-05-06 ENCOUNTER — Other Ambulatory Visit: Payer: Self-pay | Admitting: *Deleted

## 2013-05-06 ENCOUNTER — Ambulatory Visit (HOSPITAL_COMMUNITY)
Admission: RE | Admit: 2013-05-06 | Discharge: 2013-05-06 | Disposition: A | Discharge status: Home / Self Care | Payer: Medicare Other | Source: Ambulatory Visit | Attending: Oncology | Admitting: Oncology

## 2013-05-06 ENCOUNTER — Other Ambulatory Visit (HOSPITAL_BASED_OUTPATIENT_CLINIC_OR_DEPARTMENT_OTHER): Payer: Medicare Other

## 2013-05-06 VITALS — BP 109/86 | HR 91 | Temp 98.1°F | Resp 18

## 2013-05-06 DIAGNOSIS — D649 Anemia, unspecified: Secondary | ICD-10-CM | POA: Insufficient documentation

## 2013-05-06 DIAGNOSIS — C569 Malignant neoplasm of unspecified ovary: Secondary | ICD-10-CM

## 2013-05-06 DIAGNOSIS — Z5111 Encounter for antineoplastic chemotherapy: Secondary | ICD-10-CM

## 2013-05-06 DIAGNOSIS — C482 Malignant neoplasm of peritoneum, unspecified: Secondary | ICD-10-CM

## 2013-05-06 LAB — COMPREHENSIVE METABOLIC PANEL (CC13)
ALT: 15 U/L (ref 0–55)
ANION GAP: 6 meq/L (ref 3–11)
AST: 20 U/L (ref 5–34)
Albumin: 3.5 g/dL (ref 3.5–5.0)
Alkaline Phosphatase: 89 U/L (ref 40–150)
BILIRUBIN TOTAL: 0.38 mg/dL (ref 0.20–1.20)
BUN: 20.2 mg/dL (ref 7.0–26.0)
CALCIUM: 9.5 mg/dL (ref 8.4–10.4)
CHLORIDE: 105 meq/L (ref 98–109)
CO2: 24 meq/L (ref 22–29)
CREATININE: 1.1 mg/dL (ref 0.6–1.1)
Glucose: 135 mg/dl (ref 70–140)
Potassium: 4.3 mEq/L (ref 3.5–5.1)
Sodium: 135 mEq/L — ABNORMAL LOW (ref 136–145)
Total Protein: 5.9 g/dL — ABNORMAL LOW (ref 6.4–8.3)

## 2013-05-06 LAB — CBC WITH DIFFERENTIAL/PLATELET
BASO%: 0.7 % (ref 0.0–2.0)
BASOS ABS: 0 10*3/uL (ref 0.0–0.1)
EOS ABS: 0.2 10*3/uL (ref 0.0–0.5)
EOS%: 5.9 % (ref 0.0–7.0)
HEMATOCRIT: 27.9 % — AB (ref 34.8–46.6)
HEMOGLOBIN: 9.1 g/dL — AB (ref 11.6–15.9)
LYMPH%: 23.9 % (ref 14.0–49.7)
MCH: 32.3 pg (ref 25.1–34.0)
MCHC: 32.6 g/dL (ref 31.5–36.0)
MCV: 98.9 fL (ref 79.5–101.0)
MONO#: 0.7 10*3/uL (ref 0.1–0.9)
MONO%: 23.9 % — AB (ref 0.0–14.0)
NEUT%: 45.6 % (ref 38.4–76.8)
NEUTROS ABS: 1.4 10*3/uL — AB (ref 1.5–6.5)
NRBC: 0 % (ref 0–0)
PLATELETS: 162 10*3/uL (ref 145–400)
RBC: 2.82 10*6/uL — ABNORMAL LOW (ref 3.70–5.45)
RDW: 15.7 % — ABNORMAL HIGH (ref 11.2–14.5)
WBC: 3.1 10*3/uL — AB (ref 3.9–10.3)
lymph#: 0.7 10*3/uL — ABNORMAL LOW (ref 0.9–3.3)

## 2013-05-06 MED ORDER — SODIUM CHLORIDE 0.9 % IV SOLN
Freq: Once | INTRAVENOUS | Status: AC
  Administered 2013-05-06: 11:00:00 via INTRAVENOUS

## 2013-05-06 MED ORDER — ONDANSETRON 8 MG/50ML IVPB (CHCC)
8.0000 mg | Freq: Once | INTRAVENOUS | Status: AC
  Administered 2013-05-06: 8 mg via INTRAVENOUS

## 2013-05-06 MED ORDER — PACLITAXEL PROTEIN-BOUND CHEMO INJECTION 100 MG
100.0000 mg/m2 | Freq: Once | INTRAVENOUS | Status: AC
  Administered 2013-05-06: 200 mg via INTRAVENOUS
  Filled 2013-05-06: qty 40

## 2013-05-06 MED ORDER — DEXAMETHASONE SODIUM PHOSPHATE 10 MG/ML IJ SOLN
INTRAMUSCULAR | Status: AC
  Filled 2013-05-06: qty 1

## 2013-05-06 MED ORDER — DEXAMETHASONE SODIUM PHOSPHATE 10 MG/ML IJ SOLN
10.0000 mg | Freq: Once | INTRAMUSCULAR | Status: AC
  Administered 2013-05-06: 10 mg via INTRAVENOUS

## 2013-05-06 MED ORDER — ONDANSETRON 8 MG/NS 50 ML IVPB
INTRAVENOUS | Status: AC
  Filled 2013-05-06: qty 8

## 2013-05-06 MED ORDER — HEPARIN SOD (PORK) LOCK FLUSH 100 UNIT/ML IV SOLN
500.0000 [IU] | Freq: Once | INTRAVENOUS | Status: AC | PRN
  Administered 2013-05-06: 500 [IU]
  Filled 2013-05-06: qty 5

## 2013-05-06 MED ORDER — SODIUM CHLORIDE 0.9 % IJ SOLN
10.0000 mL | INTRAMUSCULAR | Status: DC | PRN
  Administered 2013-05-06: 10 mL
  Filled 2013-05-06: qty 10

## 2013-05-06 NOTE — Patient Instructions (Signed)
[facility] Discharge Instructions for Patients Receiving Chemotherapy  Today you received the following chemotherapy agents Abraxane  To help prevent nausea and vomiting after your treatment, we encourage you to take your nausea medication as prescribed.   If you develop nausea and vomiting that is not controlled by your nausea medication, call the clinic.   BELOW ARE SYMPTOMS THAT SHOULD BE REPORTED IMMEDIATELY:  *FEVER GREATER THAN 100.5 F  *CHILLS WITH OR WITHOUT FEVER  NAUSEA AND VOMITING THAT IS NOT CONTROLLED WITH YOUR NAUSEA MEDICATION  *UNUSUAL SHORTNESS OF BREATH  *UNUSUAL BRUISING OR BLEEDING  TENDERNESS IN MOUTH AND THROAT WITH OR WITHOUT PRESENCE OF ULCERS  *URINARY PROBLEMS  *BOWEL PROBLEMS  UNUSUAL RASH Items with * indicate a potential emergency and should be followed up as soon as possible.  Feel free to call the clinic you have any questions or concerns. The clinic phone number is [phone].    

## 2013-05-06 NOTE — Progress Notes (Signed)
Ok to treat with ANC 1.4  Per Dr. Jana Hakim

## 2013-05-07 ENCOUNTER — Other Ambulatory Visit: Payer: Self-pay | Admitting: *Deleted

## 2013-05-07 ENCOUNTER — Telehealth: Payer: Self-pay | Admitting: Oncology

## 2013-05-07 LAB — CA 125: CA 125: 26.2 U/mL (ref 0.0–30.2)

## 2013-05-07 NOTE — Telephone Encounter (Signed)
, °

## 2013-05-08 ENCOUNTER — Telehealth: Payer: Self-pay | Admitting: Oncology

## 2013-05-08 LAB — PREPARE RBC (CROSSMATCH)

## 2013-05-08 NOTE — Telephone Encounter (Signed)
, °

## 2013-05-10 ENCOUNTER — Other Ambulatory Visit (HOSPITAL_BASED_OUTPATIENT_CLINIC_OR_DEPARTMENT_OTHER): Payer: Medicare Other

## 2013-05-10 ENCOUNTER — Other Ambulatory Visit: Payer: Self-pay | Admitting: *Deleted

## 2013-05-10 ENCOUNTER — Encounter: Payer: Self-pay | Admitting: *Deleted

## 2013-05-10 ENCOUNTER — Ambulatory Visit (HOSPITAL_BASED_OUTPATIENT_CLINIC_OR_DEPARTMENT_OTHER): Payer: Medicare Other

## 2013-05-10 VITALS — BP 128/83 | HR 76 | Temp 98.8°F | Resp 16

## 2013-05-10 DIAGNOSIS — C569 Malignant neoplasm of unspecified ovary: Secondary | ICD-10-CM

## 2013-05-10 DIAGNOSIS — C482 Malignant neoplasm of peritoneum, unspecified: Secondary | ICD-10-CM

## 2013-05-10 DIAGNOSIS — D649 Anemia, unspecified: Secondary | ICD-10-CM

## 2013-05-10 LAB — CBC WITH DIFFERENTIAL/PLATELET
BASO%: 0.4 % (ref 0.0–2.0)
BASOS ABS: 0 10*3/uL (ref 0.0–0.1)
EOS%: 2.4 % (ref 0.0–7.0)
Eosinophils Absolute: 0.1 10*3/uL (ref 0.0–0.5)
HEMATOCRIT: 30.5 % — AB (ref 34.8–46.6)
HEMOGLOBIN: 10.1 g/dL — AB (ref 11.6–15.9)
LYMPH%: 15.8 % (ref 14.0–49.7)
MCH: 33 pg (ref 25.1–34.0)
MCHC: 33 g/dL (ref 31.5–36.0)
MCV: 100 fL (ref 79.5–101.0)
MONO#: 0.2 10*3/uL (ref 0.1–0.9)
MONO%: 5.6 % (ref 0.0–14.0)
NEUT#: 2.2 10*3/uL (ref 1.5–6.5)
NEUT%: 75.8 % (ref 38.4–76.8)
PLATELETS: 188 10*3/uL (ref 145–400)
RBC: 3.05 10*6/uL — ABNORMAL LOW (ref 3.70–5.45)
RDW: 17.2 % — AB (ref 11.2–14.5)
WBC: 2.9 10*3/uL — ABNORMAL LOW (ref 3.9–10.3)
lymph#: 0.5 10*3/uL — ABNORMAL LOW (ref 0.9–3.3)

## 2013-05-10 LAB — TYPE AND SCREEN
ABO/RH(D): A POS
Antibody Screen: NEGATIVE
UNIT DIVISION: 0
Unit division: 0

## 2013-05-10 LAB — HOLD TUBE, BLOOD BANK

## 2013-05-10 LAB — PREPARE RBC (CROSSMATCH)

## 2013-05-10 MED ORDER — HEPARIN SOD (PORK) LOCK FLUSH 100 UNIT/ML IV SOLN
500.0000 [IU] | Freq: Every day | INTRAVENOUS | Status: DC | PRN
  Filled 2013-05-10: qty 5

## 2013-05-10 MED ORDER — SODIUM CHLORIDE 0.9 % IV SOLN: 250.0000 mL | Freq: Once | INTRAVENOUS | Status: DC

## 2013-05-10 MED ORDER — ACETAMINOPHEN 325 MG PO TABS
ORAL_TABLET | ORAL | Status: AC
  Filled 2013-05-10: qty 2

## 2013-05-10 MED ORDER — SODIUM CHLORIDE 0.9 % IJ SOLN
3.0000 mL | INTRAMUSCULAR | Status: AC | PRN
  Administered 2013-05-10: 3 mL
  Filled 2013-05-10: qty 10

## 2013-05-10 MED ORDER — HEPARIN SOD (PORK) LOCK FLUSH 100 UNIT/ML IV SOLN
250.0000 [IU] | INTRAVENOUS | Status: AC | PRN
  Administered 2013-05-10: 250 [IU]
  Filled 2013-05-10: qty 5

## 2013-05-10 MED ORDER — ACETAMINOPHEN 325 MG PO TABS
650.0000 mg | ORAL_TABLET | Freq: Once | ORAL | Status: AC
  Administered 2013-05-10: 650 mg via ORAL

## 2013-05-10 MED ORDER — DIPHENHYDRAMINE HCL 25 MG PO CAPS
25.0000 mg | ORAL_CAPSULE | Freq: Once | ORAL | Status: AC
  Administered 2013-05-10: 25 mg via ORAL

## 2013-05-10 MED ORDER — DIPHENHYDRAMINE HCL 25 MG PO CAPS
ORAL_CAPSULE | ORAL | Status: AC
  Filled 2013-05-10: qty 1

## 2013-05-10 MED ORDER — SODIUM CHLORIDE 0.9 % IV SOLN
Freq: Once | INTRAVENOUS | Status: AC
  Administered 2013-05-10: 13:00:00 via INTRAVENOUS

## 2013-05-10 MED ORDER — SODIUM CHLORIDE 0.9 % IJ SOLN
10.0000 mL | INTRAMUSCULAR | Status: AC | PRN
  Administered 2013-05-10: 10 mL
  Filled 2013-05-10: qty 10

## 2013-05-10 NOTE — Progress Notes (Signed)
NOTED HEME OF 10.1 PER LAB DRAW PRIOR TO SCHEDULED BLOOD TRANSFUSION.  PER MD THIS RN ASSESSED PT WITH PT STATING "DIZZINESS" OCCURING. ORTHOSTATIC BP AND PULSE CHECKED WITH NOTED CONCERN.  REPORTED TO MD WHO REQUESTED 500CC OF NS PRIOR TO FIRST UNIT AND PROCEED WITH BLOOD TRANSFUSION.  REQUESTED RECHECK OF ORTHOSTATIC BP AND PULSE POST 1ST UNIT FOR POSSIBLE NEED FOR LASIX BEFORE 2ND UNIT.

## 2013-05-10 NOTE — Patient Instructions (Signed)
Blood Transfusion  A blood transfusion replaces your blood or some of its parts. Blood is replaced when you have lost blood because of surgery, an accident, or for severe blood conditions like anemia. You can donate blood to be used on yourself if you have a planned surgery. If you lose blood during that surgery, your own blood can be given back to you. Any blood given to you is checked to make sure it matches your blood type. Your temperature, blood pressure, and heart rate (vital signs) will be checked often.  GET HELP RIGHT AWAY IF:   You feel sick to your stomach (nauseous) or throw up (vomit).  You have watery poop (diarrhea).  You have shortness of breath or trouble breathing.  You have blood in your pee (urine) or have dark colored pee.  You have chest pain or tightness.  Your eyes or skin turn yellow (jaundice).  You have a temperature by mouth above 102 F (38.9 C), not controlled by medicine.  You start to shake and have chills.  You develop a a red rash (hives) or feel itchy.  You develop lightheadedness or feel confused.  You develop back, joint, or muscle pain.  You do not feel hungry (lost appetite).  You feel tired, restless, or nervous.  You develop belly (abdominal) cramps. Document Released: 05/20/2008 Document Revised: 05/16/2011 Document Reviewed: 05/20/2008 [employer] Patient Information 2014 [employer].  

## 2013-05-10 NOTE — Progress Notes (Signed)
At the end of unit #1, PRBCs, patient feels a little better, but still states she is weak. Patient still slightly orthostatic, does not complain of any shortness of breath, chest pain or heart palpitations. Will continue with second unit. Symptoms of CHF discussed with patient and daughter. Verbalized understanding.

## 2013-05-11 LAB — TYPE AND SCREEN
ABO/RH(D): A POS
Antibody Screen: NEGATIVE
UNIT DIVISION: 0
Unit division: 0

## 2013-05-20 ENCOUNTER — Ambulatory Visit (HOSPITAL_BASED_OUTPATIENT_CLINIC_OR_DEPARTMENT_OTHER): Payer: Medicare Other

## 2013-05-20 ENCOUNTER — Other Ambulatory Visit (HOSPITAL_BASED_OUTPATIENT_CLINIC_OR_DEPARTMENT_OTHER): Payer: Medicare Other

## 2013-05-20 VITALS — BP 141/78 | HR 80 | Temp 97.6°F | Resp 17

## 2013-05-20 DIAGNOSIS — C569 Malignant neoplasm of unspecified ovary: Secondary | ICD-10-CM

## 2013-05-20 DIAGNOSIS — Z5111 Encounter for antineoplastic chemotherapy: Secondary | ICD-10-CM

## 2013-05-20 DIAGNOSIS — C482 Malignant neoplasm of peritoneum, unspecified: Secondary | ICD-10-CM

## 2013-05-20 LAB — COMPREHENSIVE METABOLIC PANEL (CC13)
ALK PHOS: 105 U/L (ref 40–150)
ALT: 15 U/L (ref 0–55)
ANION GAP: 11 meq/L (ref 3–11)
AST: 17 U/L (ref 5–34)
Albumin: 3.7 g/dL (ref 3.5–5.0)
BUN: 21.4 mg/dL (ref 7.0–26.0)
CO2: 23 meq/L (ref 22–29)
CREATININE: 1.2 mg/dL — AB (ref 0.6–1.1)
Calcium: 10 mg/dL (ref 8.4–10.4)
Chloride: 106 mEq/L (ref 98–109)
Glucose: 166 mg/dl — ABNORMAL HIGH (ref 70–140)
Potassium: 4.1 mEq/L (ref 3.5–5.1)
Sodium: 140 mEq/L (ref 136–145)
Total Bilirubin: 0.44 mg/dL (ref 0.20–1.20)
Total Protein: 6.4 g/dL (ref 6.4–8.3)

## 2013-05-20 LAB — CBC WITH DIFFERENTIAL/PLATELET
BASO%: 0.3 % (ref 0.0–2.0)
Basophils Absolute: 0 10*3/uL (ref 0.0–0.1)
EOS ABS: 0.1 10*3/uL (ref 0.0–0.5)
EOS%: 2.9 % (ref 0.0–7.0)
HEMATOCRIT: 38 % (ref 34.8–46.6)
HGB: 12.5 g/dL (ref 11.6–15.9)
LYMPH%: 19 % (ref 14.0–49.7)
MCH: 30.8 pg (ref 25.1–34.0)
MCHC: 32.9 g/dL (ref 31.5–36.0)
MCV: 93.6 fL (ref 79.5–101.0)
MONO#: 0.6 10*3/uL (ref 0.1–0.9)
MONO%: 18 % — ABNORMAL HIGH (ref 0.0–14.0)
NEUT%: 59.8 % (ref 38.4–76.8)
NEUTROS ABS: 1.8 10*3/uL (ref 1.5–6.5)
PLATELETS: 153 10*3/uL (ref 145–400)
RBC: 4.06 10*6/uL (ref 3.70–5.45)
RDW: 17 % — ABNORMAL HIGH (ref 11.2–14.5)
WBC: 3.1 10*3/uL — AB (ref 3.9–10.3)
lymph#: 0.6 10*3/uL — ABNORMAL LOW (ref 0.9–3.3)

## 2013-05-20 MED ORDER — ONDANSETRON 8 MG/NS 50 ML IVPB
INTRAVENOUS | Status: AC
  Filled 2013-05-20: qty 8

## 2013-05-20 MED ORDER — ONDANSETRON 8 MG/50ML IVPB (CHCC)
8.0000 mg | Freq: Once | INTRAVENOUS | Status: AC
  Administered 2013-05-20: 8 mg via INTRAVENOUS

## 2013-05-20 MED ORDER — DEXAMETHASONE SODIUM PHOSPHATE 10 MG/ML IJ SOLN
10.0000 mg | Freq: Once | INTRAMUSCULAR | Status: AC
  Administered 2013-05-20: 10 mg via INTRAVENOUS

## 2013-05-20 MED ORDER — DEXAMETHASONE SODIUM PHOSPHATE 10 MG/ML IJ SOLN
INTRAMUSCULAR | Status: AC
  Filled 2013-05-20: qty 1

## 2013-05-20 MED ORDER — HEPARIN SOD (PORK) LOCK FLUSH 100 UNIT/ML IV SOLN
500.0000 [IU] | Freq: Once | INTRAVENOUS | Status: AC | PRN
  Administered 2013-05-20: 500 [IU]
  Filled 2013-05-20: qty 5

## 2013-05-20 MED ORDER — SODIUM CHLORIDE 0.9 % IJ SOLN
10.0000 mL | INTRAMUSCULAR | Status: DC | PRN
  Administered 2013-05-20: 10 mL
  Filled 2013-05-20: qty 10

## 2013-05-20 MED ORDER — PACLITAXEL PROTEIN-BOUND CHEMO INJECTION 100 MG
100.0000 mg/m2 | Freq: Once | INTRAVENOUS | Status: AC
  Administered 2013-05-20: 200 mg via INTRAVENOUS
  Filled 2013-05-20: qty 40

## 2013-05-20 MED ORDER — SODIUM CHLORIDE 0.9 % IV SOLN
Freq: Once | INTRAVENOUS | Status: AC
  Administered 2013-05-20: 11:00:00 via INTRAVENOUS

## 2013-05-20 NOTE — Patient Instructions (Signed)
[facility] Discharge Instructions for Patients Receiving Chemotherapy  Today you received the following chemotherapy agents Abraxane  To help prevent nausea and vomiting after your treatment, we encourage you to take your nausea medication as prescribed.   If you develop nausea and vomiting that is not controlled by your nausea medication, call the clinic.   BELOW ARE SYMPTOMS THAT SHOULD BE REPORTED IMMEDIATELY:  *FEVER GREATER THAN 100.5 F  *CHILLS WITH OR WITHOUT FEVER  NAUSEA AND VOMITING THAT IS NOT CONTROLLED WITH YOUR NAUSEA MEDICATION  *UNUSUAL SHORTNESS OF BREATH  *UNUSUAL BRUISING OR BLEEDING  TENDERNESS IN MOUTH AND THROAT WITH OR WITHOUT PRESENCE OF ULCERS  *URINARY PROBLEMS  *BOWEL PROBLEMS  UNUSUAL RASH Items with * indicate a potential emergency and should be followed up as soon as possible.  Feel free to call the clinic you have any questions or concerns. The clinic phone number is [phone].    

## 2013-05-23 ENCOUNTER — Telehealth: Payer: Self-pay | Admitting: *Deleted

## 2013-05-23 NOTE — Telephone Encounter (Signed)
This RN spoke with pt today per her call 3/18 stating noted redness on cheek upon waking.  Per discussion today- redness has decreased. Pt denies any swelling or noted area of pain in cheek.  She states she has noticed since last treatment increased neuropathy in her hands and feet.  Above discussed with cheek redness likely related to steroid use as premed.  Noted concern with neuropathy which will need to be assessed by provider before next scheduled treatment.  Pt understands to call if symptoms worsen or new concerns arise.

## 2013-05-24 ENCOUNTER — Telehealth: Payer: Self-pay | Admitting: Oncology

## 2013-05-24 NOTE — Telephone Encounter (Signed)
S/w the pt and she is aware of her appts on 05/31/2013@3 :00pm

## 2013-05-31 ENCOUNTER — Telehealth: Payer: Self-pay | Admitting: *Deleted

## 2013-05-31 ENCOUNTER — Telehealth: Payer: Self-pay | Admitting: Oncology

## 2013-05-31 ENCOUNTER — Ambulatory Visit (HOSPITAL_BASED_OUTPATIENT_CLINIC_OR_DEPARTMENT_OTHER): Payer: Medicare Other | Admitting: Oncology

## 2013-05-31 ENCOUNTER — Other Ambulatory Visit (HOSPITAL_BASED_OUTPATIENT_CLINIC_OR_DEPARTMENT_OTHER): Payer: Medicare Other

## 2013-05-31 VITALS — BP 128/75 | HR 72 | Temp 98.4°F | Resp 18 | Ht 66.0 in | Wt 168.1 lb

## 2013-05-31 DIAGNOSIS — C482 Malignant neoplasm of peritoneum, unspecified: Secondary | ICD-10-CM

## 2013-05-31 DIAGNOSIS — D649 Anemia, unspecified: Secondary | ICD-10-CM

## 2013-05-31 DIAGNOSIS — G63 Polyneuropathy in diseases classified elsewhere: Secondary | ICD-10-CM

## 2013-05-31 DIAGNOSIS — C569 Malignant neoplasm of unspecified ovary: Secondary | ICD-10-CM

## 2013-05-31 DIAGNOSIS — C801 Malignant (primary) neoplasm, unspecified: Secondary | ICD-10-CM

## 2013-05-31 DIAGNOSIS — G3189 Other specified degenerative diseases of nervous system: Secondary | ICD-10-CM

## 2013-05-31 LAB — CBC WITH DIFFERENTIAL/PLATELET
BASO%: 0.3 % (ref 0.0–2.0)
BASOS ABS: 0 10*3/uL (ref 0.0–0.1)
EOS%: 2.6 % (ref 0.0–7.0)
Eosinophils Absolute: 0.1 10*3/uL (ref 0.0–0.5)
HEMATOCRIT: 36.2 % (ref 34.8–46.6)
HEMOGLOBIN: 11.9 g/dL (ref 11.6–15.9)
LYMPH#: 0.6 10*3/uL — AB (ref 0.9–3.3)
LYMPH%: 15.8 % (ref 14.0–49.7)
MCH: 30.7 pg (ref 25.1–34.0)
MCHC: 32.8 g/dL (ref 31.5–36.0)
MCV: 93.7 fL (ref 79.5–101.0)
MONO#: 0.5 10*3/uL (ref 0.1–0.9)
MONO%: 13.3 % (ref 0.0–14.0)
NEUT#: 2.8 10*3/uL (ref 1.5–6.5)
NEUT%: 68 % (ref 38.4–76.8)
Platelets: 175 10*3/uL (ref 145–400)
RBC: 3.87 10*6/uL (ref 3.70–5.45)
RDW: 19.5 % — AB (ref 11.2–14.5)
WBC: 4.1 10*3/uL (ref 3.9–10.3)

## 2013-05-31 LAB — COMPREHENSIVE METABOLIC PANEL (CC13)
ALK PHOS: 100 U/L (ref 40–150)
ALT: 21 U/L (ref 0–55)
AST: 23 U/L (ref 5–34)
Albumin: 3.8 g/dL (ref 3.5–5.0)
Anion Gap: 13 mEq/L — ABNORMAL HIGH (ref 3–11)
BILIRUBIN TOTAL: 0.41 mg/dL (ref 0.20–1.20)
BUN: 25 mg/dL (ref 7.0–26.0)
CO2: 23 meq/L (ref 22–29)
Calcium: 10.1 mg/dL (ref 8.4–10.4)
Chloride: 101 mEq/L (ref 98–109)
Creatinine: 1.3 mg/dL — ABNORMAL HIGH (ref 0.6–1.1)
Glucose: 109 mg/dl (ref 70–140)
POTASSIUM: 4.9 meq/L (ref 3.5–5.1)
SODIUM: 136 meq/L (ref 136–145)
TOTAL PROTEIN: 6.5 g/dL (ref 6.4–8.3)

## 2013-05-31 LAB — HOLD TUBE, BLOOD BANK

## 2013-05-31 NOTE — Telephone Encounter (Signed)
, °

## 2013-05-31 NOTE — Progress Notes (Signed)
ID: Regina West   DOB: 12-28-1929  MR#: [id number]  [id number]  PCP: Regina Lyons, MD GYN: SU: Regina Minister, MD OTHER MD: Regina Seat, MD  CHIEF COMPLAINT:  Recurrent ovarian cancer  HISTORY OF PRESENT ILLNESS: The patient originally presented in the summer of 2010 with cramps and abdominal distension.  I do not have a copy of the initial evaluation, but on November 26, 2008, the patient underwent optimal debulking with bilateral salpingo-oophorectomy, omentectomy, and the placement of an intraperitoneal port.  The pathology from that procedure (Accession Number [id number] at [facility]) showed first of all, significant involvement of the omentum, minimal involvement of the right ovary and right fallopian tube and negative on the left ovary and fallopian tube, with neither ovary being enlarged, consistent with a primary peritoneal serous adenocarcinoma, described as moderately differentiated.  The sample included no lymph nodes.    The patient had an intraperitoneal port placed in the same surgery and was treated with intraperitoneal and IV chemotherapy according to GOG-252 but I am not sure which arm she was in.  (Arm 2 did carboplatin intraperitoneally and Taxol IV.  Arm 3 did cisplatin and paclitaxel intraperitoneally with paclitaxel IV.)  All arms received bevacizumab.  Unfortunately, after 4 cycles of treatment, she had acute mental status changes and was admitted here January of 2011 with what proved to be posterior reversible leukoencephalopathy and SIADH.  She had seizures, aphasia, and required intubation.  Once the patient recovered from this, she was treated with 2 cycles of single-agent carboplatin (I do not have the AUC). Her last adjuvant treatment was May 12, 2009, and her CA-125 at that time was 10.3.  Her intraperitoneal port was removed in April of 2011.    More recently, the patient was in routine follow up when her CA-125 was found to jump up to 2,269.7 (March 26, 2010).   This is 10 months after her last prior chemotherapy.  She had CTs of the chest, abdomen and pelvis January 26th which showed ascites and enhancing peritoneal nodularity. She had had similar findings at presentation but these had completely resolved by the time she finished treatment in March of 2011. The patient was felt to be in first relapse.  Subsequent treatments are as summarized below   INTERVAL HISTORY:  Regina West returns today with her daughter  for followup of her recurrent ovarian cancer. She is due for day 12 after her third dose of every 14 days Abraxane. She is living in a 2 bedroom apartment at [facility] and her whole furniture has been moved [employer] insofar as it fits. The rest of the furniture is being distributed among other children. She is not however yet ready to sell her house.  REVIEW OF SYSTEMS:  Regina West is doing remarkably well with the Abraxane. In particular she has not developed any peripheral neuropathy. She has lost her hair. She was feeling somewhat lethargic before but now she tells me she has more energy. She does do she used to be ready to quit, but now would there are several things that I have to get done" (. She enjoys her apartment, she can hear dogs and a train at night, which she enjoys, and the food is "acceptable". She does have nocturia x3, and can be short of breath sometimes, even at rest, but this is very transient and possibly related to anxiety. She is hoping we can discontinue the Decadron because it causes her face to get flushed (this is only used for premeds and at  this point, after multiple treatments with Abraxane, I think you can be safely stopped). A detailed review of systems today was otherwise negative and in particular there was no abdominal bloating, pain, or cramping and no evidence of bowel obstruction. A detailed review of systems was otherwise stable  PAST MEDICAL HISTORY: Significant for hysterectomy at the age of 35 with concurrent bladder "tuck  up."  She also underwent appendectomy during that procedure.  She is status post prior bilateral cataract surgery. She is status post tonsillectomy and adenoidectomy.  She has a history of hypertension and diabetes. She has a history of diverticulosis.  She has a large hiatal hernia.  She has degenerative disk disease.  She has been noted to have coronary calcifications and she has hyperlipidemia.  There is a history of remote tobacco abuse. Seizure disorder as per HPI  FAMILY HISTORY The patient's father died at the age of 42 after heart surgery for aortic stenosis.  The patient's mother died at the age of 34.  The patient has one brother in good health.  There is no other family history to her knowledge of ovarian or breast cancer.    GYNECOLOGIC HISTORY: Menarche at age 73.  She is GX P3 with menopause in her late 83s.  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 She is currently residing in [place]. Her husband was in sales and had a history of Parkinson's disease.  He died following a fall in the year 2000.  The patient's son Regina West lives in [place], and has a history of MS.  Daughter Regina West lives in [place]. She is a Agricultural engineer.  Son Regina West lives in [place], [place] and is in Press photographer. The patient has 8 grandchildren. She attends the [employer].  She is a good friend of our patients Regina West DIRECTIVES: in place  HEALTH MAINTENANCE:  (Updated December 2014) History  Substance Use Topics  . Smoking status: Former Smoker    Quit date: 05/23/1964  . Smokeless tobacco: Never Used  . Alcohol Use: 0.0 oz/week     Comment: rarely     Colonoscopy: 2008  PAP: UTD  MM:  Declines   Allergies  Allergen Reactions  . Avastin [Bevacizumab] Other (See Comments)    Swelling of the brain     Current Outpatient Prescriptions   Medication Sig Dispense Refill  . acyclovir (ZOVIRAX) 400 MG tablet Take 1 tablet (400 mg total) by mouth 2 (two) times daily.  60 tablet  12  . antiseptic oral rinse (BIOTENE) LIQD 15 mLs by Mouth Rinse route 2 (two) times daily.  1 Bottle  0  . cholecalciferol (VITAMIN D) 1000 UNITS tablet Take 1,000 Units by mouth 2 (two) times daily.      Marland Kitchen levETIRAcetam (KEPPRA) 250 MG tablet Take 1 tablet (250 mg total) by mouth 2 (two) times daily.  60 tablet  11  . nitrofurantoin (MACRODANTIN) 50 MG capsule TAKE TWO CAPSULES BY MOUTH ONCE DAILY  60 capsule  3  . omeprazole (PRILOSEC) 40 MG capsule Take 1 capsule (40 mg total) by mouth at bedtime.  30 capsule  3  . ondansetron (ZOFRAN) 8 MG tablet Take 1 tablet (8 mg total) by mouth 2 (two) times daily. Take two times a day starting the day after chemo for 2 days. Then take two times a  day as needed for nausea or vomiting.  30 tablet  1  . VIIBRYD 40 MG TABS        No current facility-administered medications for this visit.    OBJECTIVE: Elderly white woman who appears stated age 80 Vitals:   05/31/13 1253  BP: 128/75  Pulse: 72  Temp: 98.4 F (36.9 C)  Resp: 18     Body mass index is 27.15 kg/(m^2).    ECOG FS: 1 Filed Weights   05/31/13 1253  Weight: 168 lb 1.6 oz (76.25 kg)   Sclerae unicteric, extraocular movements intact Oropharynx clear and moist No cervical or supraclavicular adenopathy Lungs no rales or rhonchi, good excursion bilaterally Heart regular rate and rhythm, no murmur appreciated Abd soft, nontender, nondistended, no shifting dullness, positive bowel sounds, no masses palpated MSK no focal spinal tenderness, no upper extremity lymphedema Neuro: nonfocal, well oriented, positiveaffect Breasts: Deferred   LAB RESULTS: Results for JELENA, MALICOAT (MRN [id number]) as of 05/31/2013 14:31  Ref. Range 03/04/2013 12:26 03/22/2013 10:23 04/01/2013 10:49 04/22/2013 10:23 05/06/2013 10:01  CA 125 Latest Range: 0.0-30.2 U/mL  37.7 (H) 23.7 24.8 30.8 (H) 26.2     Lab Results  Component Value Date   WBC 4.1 05/31/2013   NEUTROABS 2.8 05/31/2013   HGB 11.9 05/31/2013   HCT 36.2 05/31/2013   MCV 93.7 05/31/2013   PLT 175 05/31/2013      Chemistry      Component Value Date/Time   NA 140 05/20/2013 1020   NA 136 10/17/2011 1118   NA 143 05/12/2011 1337   K 4.1 05/20/2013 1020   K 3.9 10/17/2011 1118   K 4.1 05/12/2011 1337   CL 100 08/20/2012 1124   CL 100 10/17/2011 1118   CL 92* 05/12/2011 1337   CO2 23 05/20/2013 1020   CO2 27 10/17/2011 1118   CO2 32 05/12/2011 1337   West 21.4 05/20/2013 1020   West 25* 10/17/2011 1118   West 19 05/12/2011 1337   CREATININE 1.2* 05/20/2013 1020   CREATININE 1.10 10/17/2011 1118   CREATININE 1.3* 05/12/2011 1337      Component Value Date/Time   CALCIUM 10.0 05/20/2013 1020   CALCIUM 9.8 10/17/2011 1118   CALCIUM 9.8 05/12/2011 1337   ALKPHOS 105 05/20/2013 1020   ALKPHOS 109 10/17/2011 1118   ALKPHOS 95* 05/12/2011 1337   AST 17 05/20/2013 1020   AST 22 10/17/2011 1118   AST 40* 05/12/2011 1337   ALT 15 05/20/2013 1020   ALT 14 10/17/2011 1118   ALT 28 05/12/2011 1337   BILITOT 0.44 05/20/2013 1020   BILITOT 0.4 10/17/2011 1118   BILITOT 0.60 05/12/2011 1337      STUDIES: No results found.  ASSESSMENT: 80 y.o.   woman   (1) status post optimal debulking of a primary peritoneal serous adenocarcinoma September 2010, the tumor being moderately differentiated, pT3c NX (stage IIIC), treated according to GOG 252 with  intraperitoneal platinum and paclitaxel x4 given along with bevacizumab, complicated by SIADHafter cycle 4, also with posterior reversible leukoencephalopathy.  After these problems resolved she received 2 additional cycles of single-agent carboplatin completed in March 2011  (2)  She had her first recurrence February 2012  treated with carboplatin and Gemzar for a total of 6 cycles between February and June 2012.  Her CEA-125 had normalized before the beginning of cycle 5.    (3) second recurrence documented February 2013,  treated with carboplatin/ doxil x 6, completed 10/03/2011, with a good  response noted on restaging scans  (4) third recurrence documented February 2014, receiving single-agent carboplatin every 3 weeks, first dose 04/30/2012, completed 08/20/2012  (5) fourth recurrence documented by abdominal symptoms and a rising CA 125 November 2014, receiving Abraxane, initially on days one and 8 of each 21 day cycle, with first dose given on 01/16/2013; starting 04/22/2013 she has been receiving the Abraxane every 2 weeks  PLAN:  Guelda's ovarian cancer continues to be well controlled on the current regimen. She has no symptoms relating to it and she has a normal CA 125. She is tolerating the treatments remarkably well. I think it is safe to stop the dexamethasone premeds now and I am writing appropriate orders  Tonette is feeling more positive. I think the fact that we are doing the chemotherapy every 2 weeks is helping. Where as before she was "ready to quit", now she says she has "several things she wants to get done". She is settling in at [employer] and has a wonderful family support.  Accordingly we are continuing with treatment as before, namely Abraxane every 2 weeks. I have set her up for the next 3 treatments and a visit with me in 6 weeks. Of course we will continue to follow her labs and she knows to call for any problems that may develop before her next visit.  Chauncey Cruel, MD      05/31/2013

## 2013-05-31 NOTE — Telephone Encounter (Signed)
Per staff message and POF I have scheduled appts.  JMW  

## 2013-06-01 LAB — CA 125: CA 125: 115.6 U/mL — ABNORMAL HIGH (ref 0.0–30.2)

## 2013-06-03 ENCOUNTER — Ambulatory Visit: Payer: Medicare Other

## 2013-06-03 ENCOUNTER — Encounter: Payer: Self-pay | Admitting: *Deleted

## 2013-06-03 ENCOUNTER — Other Ambulatory Visit: Payer: Medicare Other

## 2013-06-03 ENCOUNTER — Other Ambulatory Visit: Payer: Self-pay | Admitting: Oncology

## 2013-06-03 NOTE — Progress Notes (Unsigned)
Regina West CA 125 has risen. This means the current treatment is not effective. She has previously expressed a great deal of ambivalence regarding continuing therapy. She tells me her daughter is going to be visiting arriving late tomorrow.  I have offered her an appointment in the afternoon of Thursday or the morning of Friday so she can come with her family and make a decision whether she would like further treatment or whether she would like to put in a hospice referral. She will let me know which of those 2 options dispense for her as soon as she knows.

## 2013-06-03 NOTE — Progress Notes (Signed)
Dr. Jana Hakim saw Regina West in infusion today due to Regina West has increased CA 125.  No chemo today as per md.  Explanations given to Regina West by Dr. Jana Hakim about next plan of care.  Regina West voiced understanding to nurse prior to discharge via ambulating by self.

## 2013-06-04 ENCOUNTER — Telehealth: Payer: Self-pay | Admitting: Oncology

## 2013-06-04 ENCOUNTER — Other Ambulatory Visit: Payer: Self-pay | Admitting: *Deleted

## 2013-06-04 NOTE — Progress Notes (Signed)
Per Dr. Jana Hakim, please schedule a family visit for tomorrow, 06/05/13, at 5:15 pm for one hour. I left a voice message on daughter's cell phone regarding the appointment. She is travelling today in [place]. [name], daughter, cell phone number is [phone].

## 2013-06-04 NOTE — Telephone Encounter (Signed)
, °

## 2013-06-05 ENCOUNTER — Ambulatory Visit (HOSPITAL_BASED_OUTPATIENT_CLINIC_OR_DEPARTMENT_OTHER): Payer: Medicare Other | Admitting: Oncology

## 2013-06-05 VITALS — BP 163/91 | HR 75 | Temp 97.3°F | Resp 20

## 2013-06-05 DIAGNOSIS — G63 Polyneuropathy in diseases classified elsewhere: Secondary | ICD-10-CM

## 2013-06-05 DIAGNOSIS — I951 Orthostatic hypotension: Secondary | ICD-10-CM

## 2013-06-05 DIAGNOSIS — R970 Elevated carcinoembryonic antigen [CEA]: Secondary | ICD-10-CM

## 2013-06-05 DIAGNOSIS — C482 Malignant neoplasm of peritoneum, unspecified: Secondary | ICD-10-CM

## 2013-06-05 DIAGNOSIS — G40909 Epilepsy, unspecified, not intractable, without status epilepticus: Secondary | ICD-10-CM

## 2013-06-05 DIAGNOSIS — E871 Hypo-osmolality and hyponatremia: Secondary | ICD-10-CM

## 2013-06-05 DIAGNOSIS — G8929 Other chronic pain: Secondary | ICD-10-CM

## 2013-06-05 DIAGNOSIS — D649 Anemia, unspecified: Secondary | ICD-10-CM

## 2013-06-05 DIAGNOSIS — M25569 Pain in unspecified knee: Secondary | ICD-10-CM

## 2013-06-05 DIAGNOSIS — G3189 Other specified degenerative diseases of nervous system: Secondary | ICD-10-CM

## 2013-06-05 DIAGNOSIS — C801 Malignant (primary) neoplasm, unspecified: Secondary | ICD-10-CM

## 2013-06-05 MED ORDER — DEXAMETHASONE 4 MG PO TABS: 8.0000 mg | ORAL_TABLET | Freq: Two times a day (BID) | ORAL | Status: DC

## 2013-06-05 MED ORDER — PROCHLORPERAZINE MALEATE 10 MG PO TABS: 10.0000 mg | ORAL_TABLET | Freq: Four times a day (QID) | ORAL | Status: DC | PRN

## 2013-06-05 MED ORDER — ONDANSETRON HCL 8 MG PO TABS: 8.0000 mg | ORAL_TABLET | Freq: Two times a day (BID) | ORAL | Status: DC

## 2013-06-05 NOTE — Progress Notes (Signed)
ID: Regina West   DOB: 10/21/1929  MR#: [id number]  KNL#:[id number]  PCP: [name], MD GYN: SU: Stephan Minister, MD OTHER MD: Larey Seat, MD  CHIEF COMPLAINT:  Recurrent ovarian cancer  HISTORY OF PRESENT ILLNESS: The patient originally presented in the summer of 2010 with cramps and abdominal distension.  I do not have a copy of the initial evaluation, but on November 26, 2008, the patient underwent optimal debulking with bilateral salpingo-oophorectomy, omentectomy, and the placement of an intraperitoneal port.  The pathology from that procedure (Accession Number [id number] at [facility]) showed first of all, significant involvement of the omentum, minimal involvement of the right ovary and right fallopian tube and negative on the left ovary and fallopian tube, with neither ovary being enlarged, consistent with a primary peritoneal serous adenocarcinoma, described as moderately differentiated.  The sample included no lymph nodes.    The patient had an intraperitoneal port placed in the same surgery and was treated with intraperitoneal and IV chemotherapy according to GOG-252 but I am not sure which arm she was in.  (Arm 2 did carboplatin intraperitoneally and Taxol IV.  Arm 3 did cisplatin and paclitaxel intraperitoneally with paclitaxel IV.)  All arms received bevacizumab.  Unfortunately, after 4 cycles of treatment, she had acute mental status changes and was admitted here January of 2011 with what proved to be posterior reversible leukoencephalopathy and SIADH.  She had seizures, aphasia, and required intubation.  Once the patient recovered from this, she was treated with 2 cycles of single-agent carboplatin (I do not have the AUC). Her last adjuvant treatment was May 12, 2009, and her CA-125 at that time was 10.3.  Her intraperitoneal port was removed in April of 2011.    More recently, the patient was in routine follow up when her CA-125 was found to jump up to 2,269.7 (March 26, 2010).   This is 10 months after her last prior chemotherapy.  She had CTs of the chest, abdomen and pelvis January 26th which showed ascites and enhancing peritoneal nodularity. She had had similar findings at presentation but these had completely resolved by the time she finished treatment in March of 2011. The patient was felt to be in first relapse.  Subsequent treatments are as summarized below   INTERVAL HISTORY:  Regina West returns today with her daughter, son, and daughter-in-law for followup of Regina West's ovarian cancer. She is now day 17 from her 11th dose of Abraxane. After the last visit here we had a repeat CA 125 which showed a significant rise. I called Regina West to let her know and since she had been intimatingthat she might not want any more treatment in any case, we decided it would be prudent to have a family meeting to discuss her options.  REVIEW OF SYSTEMS:  Regina West is feeling better, now that she has not had chemotherapy for a little over 2 weeks. Specifically her appetite is better, she is having no unusual headaches, visual changes, nausea, vomiting, shortness of breath, change in bowel or bladder habits, or pain. She has met for gentleman next-door next-door to her apartment in [place] and she finds them very interesting. Her daughter is planning to take her to the beach for Easter. A detailed review of systems today was entirely stable  PAST MEDICAL HISTORY: Significant for hysterectomy at the age of 39 with concurrent bladder "tuck up."  She also underwent appendectomy during that procedure.  She is status post prior bilateral cataract surgery. She is status post tonsillectomy and adenoidectomy.  She has a history of hypertension and diabetes. She has a history of diverticulosis.  She has a large hiatal hernia.  She has degenerative disk disease.  She has been noted to have coronary calcifications and she has hyperlipidemia.  There is a history of remote tobacco abuse. Seizure disorder as per  HPI  FAMILY HISTORY The patient's father died at the age of 24 after heart surgery for aortic stenosis.  The patient's mother died at the age of 37.  The patient has one brother in good health.  There is no other family history to her knowledge of ovarian or breast cancer.    GYNECOLOGIC HISTORY: Menarche at age 22.  She is GX P3 with menopause in her late 84s.  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 She is currently residing in [place]. Her husband was in sales and had a history of Parkinson's disease.  He died following a fall in the year 2000.  The patient's son Regina West lives in [place], and has a history of MS.  Daughter Regina West lives in [place]. She is a Futures trader.  Son Regina West lives in [place], [place] and is in Airline pilot. The patient has 8 grandchildren. She attends the [employer].  She is a good friend of our patients [name]   ADVANCED DIRECTIVES: in place  HEALTH MAINTENANCE:  (Updated December 2014) History  Substance Use Topics  . Smoking status: Former Smoker    Quit date: 05/23/1964  . Smokeless tobacco: Never Used  . Alcohol Use: 0.0 oz/week     Comment: rarely     Colonoscopy: 2008  PAP: UTD  MM:  Declines   Allergies  Allergen Reactions  . Avastin [Bevacizumab] Other (See Comments)    Swelling of the brain     Current Outpatient Prescriptions  Medication Sig Dispense Refill  . acyclovir (ZOVIRAX) 400 MG tablet Take 1 tablet (400 mg total) by mouth 2 (two) times daily.  60 tablet  12  . antiseptic oral rinse (BIOTENE) LIQD 15 mLs by Mouth Rinse route 2 (two) times daily.  1 Bottle  0  . cholecalciferol (VITAMIN D) 1000 UNITS tablet Take 1,000 Units by mouth 2 (two) times daily.      Marland Kitchen levETIRAcetam (KEPPRA) 250 MG tablet Take 1 tablet (250 mg total) by mouth 2 (two) times daily.  60 tablet  11  .  nitrofurantoin (MACRODANTIN) 50 MG capsule TAKE TWO CAPSULES BY MOUTH ONCE DAILY  60 capsule  3  . omeprazole (PRILOSEC) 40 MG capsule Take 1 capsule (40 mg total) by mouth at bedtime.  30 capsule  3  . ondansetron (ZOFRAN) 8 MG tablet Take 1 tablet (8 mg total) by mouth 2 (two) times daily. Take two times a day starting the day after chemo for 2 days. Then take two times a day as needed for nausea or vomiting.  30 tablet  1  . VIIBRYD 40 MG TABS        No current facility-administered medications for this visit.    OBJECTIVE: Elderly white woman in no acute distress Filed Vitals:   06/05/13 1724  BP: 163/91  Pulse: 75  Temp: 97.3 F (36.3 C)  Resp: 20     There is no weight on file to calculate BMI.    ECOG FS: 1 There were no vitals filed for this  visit.  Physical exam was deferred today  LAB RESULTS: Results for LACHAE, HOHLER (MRN [id number]) as of 06/05/2013 18:35  Ref. Range 03/22/2013 10:23 04/01/2013 10:49 04/22/2013 10:23 05/06/2013 10:01 05/31/2013 12:10  CA 125 Latest Range: 0.0-30.2 U/mL 23.7 24.8 30.8 (H) 26.2 115.6 (H)    Lab Results  Component Value Date   WBC 4.1 05/31/2013   NEUTROABS 2.8 05/31/2013   HGB 11.9 05/31/2013   HCT 36.2 05/31/2013   MCV 93.7 05/31/2013   PLT 175 05/31/2013      Chemistry      Component Value Date/Time   NA 136 05/31/2013 1210   NA 136 10/17/2011 1118   NA 143 05/12/2011 1337   K 4.9 05/31/2013 1210   K 3.9 10/17/2011 1118   K 4.1 05/12/2011 1337   CL 100 08/20/2012 1124   CL 100 10/17/2011 1118   CL 92* 05/12/2011 1337   CO2 23 05/31/2013 1210   CO2 27 10/17/2011 1118   CO2 32 05/12/2011 1337   West 25.0 05/31/2013 1210   West 25* 10/17/2011 1118   West 19 05/12/2011 1337   CREATININE 1.3* 05/31/2013 1210   CREATININE 1.10 10/17/2011 1118   CREATININE 1.3* 05/12/2011 1337      Component Value Date/Time   CALCIUM 10.1 05/31/2013 1210   CALCIUM 9.8 10/17/2011 1118   CALCIUM 9.8 05/12/2011 1337   ALKPHOS 100 05/31/2013 1210   ALKPHOS 109 10/17/2011 1118    ALKPHOS 95* 05/12/2011 1337   AST 23 05/31/2013 1210   AST 22 10/17/2011 1118   AST 40* 05/12/2011 1337   ALT 21 05/31/2013 1210   ALT 14 10/17/2011 1118   ALT 28 05/12/2011 1337   BILITOT 0.41 05/31/2013 1210   BILITOT 0.4 10/17/2011 1118   BILITOT 0.60 05/12/2011 1337      STUDIES: No results found.  ASSESSMENT: 80 y.o.  [place] woman   (1) status post optimal debulking of a primary peritoneal serous adenocarcinoma September 2010, the tumor being moderately differentiated, pT3c NX (stage IIIC), treated according to GOG 252 with  intraperitoneal platinum and paclitaxel x4 given along with bevacizumab, complicated by SIADH after cycle 4, also with posterior reversible leukoencephalopathy.  After these problems resolved she received 2 additional cycles of single-agent carboplatin completed in March 2011  (2)  She had her first recurrence February 2012  treated with carboplatin and Gemzar for a total of 6 cycles between February and June 2012.  Her CEA-125 had normalized before the beginning of cycle 5.   (3) second recurrence documented February 2013,  treated with carboplatin/ doxil x 6, completed 10/03/2011, with a good response noted on restaging scans  (4) third recurrence documented February 2014, receiving single-agent carboplatin every 3 weeks, first dose 04/30/2012, completed 08/20/2012  (5) fourth recurrence documented by abdominal symptoms and a rising CA 125 November 2014, receiving Abraxane, initially on days one and 8 of each 21 day cycle, with first dose given on 01/16/2013; starting 04/22/2013 she received the Abraxane every 2 weeks--rising CA 125 noted 05/31/2013  PLAN:  We spent well over an hour today going over Despina's situation. She is now having progression while on gemcitabine. She is very fatigued and wants to throw in the towel. HER-2 children and her daughter-in-law though were encouraging her to try something else.  We certainly have other things that she could have.  She has not had any Cytoxan or to put you can. She has not had VP-16. She had Doxil in the past and  it seemed to work, at least in combination with carboplatin. We went over the various possibilities here, including oral agents, and the various possible toxicities and side effects depending on which agents she might choose.  After much discussion what we decided to do was to go ahead with Cytoxan and [place]. I am going to give her a drastically reduced carbo dose because it is as much as I think she would be able to take. She will have her first cycle on April 13 and I will see her 5 days later to make sure she tolerated that well. She will have a second cycle of course after 21 days. We should be able to document her response at that point. Otherwise we will consider discontinuing all therapy or trying a different agent.  Ricquel came around 2 agreeing that this was while trying. We also discussed what would happen if she did not get treated. Her daughter has plans to take her frequently to the beach this summer and hopes to give her a good time day or even if she is getting some side effects from chemotherapy.  Accordingly at this point the plan is to give carboplatin and Cytoxan to try beginning 413. We will check a CA 125 every 3 weeks. Ellyn has a good understanding of the overall plan. She agrees with that. She knows to call for any problems that may develop before next visit.  Chauncey Cruel, MD      06/05/2013

## 2013-06-06 ENCOUNTER — Telehealth: Payer: Self-pay | Admitting: Oncology

## 2013-06-06 ENCOUNTER — Telehealth: Payer: Self-pay | Admitting: *Deleted

## 2013-06-06 NOTE — Telephone Encounter (Signed)
, °

## 2013-06-06 NOTE — Telephone Encounter (Signed)
Per staff phone call and POF I have schedueld appts.  JMW  

## 2013-06-14 ENCOUNTER — Other Ambulatory Visit: Payer: Self-pay | Admitting: *Deleted

## 2013-06-14 DIAGNOSIS — K219 Gastro-esophageal reflux disease without esophagitis: Secondary | ICD-10-CM

## 2013-06-14 MED ORDER — OMEPRAZOLE 40 MG PO CPDR: 40.0000 mg | DELAYED_RELEASE_CAPSULE | Freq: Every day | ORAL | Status: DC

## 2013-06-17 ENCOUNTER — Ambulatory Visit (HOSPITAL_BASED_OUTPATIENT_CLINIC_OR_DEPARTMENT_OTHER): Payer: Medicare Other | Admitting: Oncology

## 2013-06-17 ENCOUNTER — Ambulatory Visit (HOSPITAL_BASED_OUTPATIENT_CLINIC_OR_DEPARTMENT_OTHER): Payer: Medicare Other

## 2013-06-17 ENCOUNTER — Other Ambulatory Visit (HOSPITAL_BASED_OUTPATIENT_CLINIC_OR_DEPARTMENT_OTHER): Payer: Medicare Other

## 2013-06-17 ENCOUNTER — Other Ambulatory Visit: Payer: Medicare Other

## 2013-06-17 VITALS — BP 119/72 | HR 78 | Temp 98.8°F | Resp 18 | Ht 66.0 in | Wt 167.9 lb

## 2013-06-17 DIAGNOSIS — C481 Malignant neoplasm of specified parts of peritoneum: Secondary | ICD-10-CM

## 2013-06-17 DIAGNOSIS — C482 Malignant neoplasm of peritoneum, unspecified: Secondary | ICD-10-CM

## 2013-06-17 DIAGNOSIS — Z5111 Encounter for antineoplastic chemotherapy: Secondary | ICD-10-CM

## 2013-06-17 DIAGNOSIS — M25569 Pain in unspecified knee: Secondary | ICD-10-CM

## 2013-06-17 DIAGNOSIS — C801 Malignant (primary) neoplasm, unspecified: Secondary | ICD-10-CM

## 2013-06-17 DIAGNOSIS — D649 Anemia, unspecified: Secondary | ICD-10-CM

## 2013-06-17 DIAGNOSIS — G40909 Epilepsy, unspecified, not intractable, without status epilepticus: Secondary | ICD-10-CM

## 2013-06-17 DIAGNOSIS — G63 Polyneuropathy in diseases classified elsewhere: Secondary | ICD-10-CM

## 2013-06-17 DIAGNOSIS — G3189 Other specified degenerative diseases of nervous system: Secondary | ICD-10-CM

## 2013-06-17 DIAGNOSIS — G8929 Other chronic pain: Secondary | ICD-10-CM

## 2013-06-17 DIAGNOSIS — E871 Hypo-osmolality and hyponatremia: Secondary | ICD-10-CM

## 2013-06-17 LAB — COMPREHENSIVE METABOLIC PANEL (CC13)
ALK PHOS: 104 U/L (ref 40–150)
ALT: 19 U/L (ref 0–55)
ANION GAP: 9 meq/L (ref 3–11)
AST: 24 U/L (ref 5–34)
Albumin: 3.4 g/dL — ABNORMAL LOW (ref 3.5–5.0)
BUN: 30.1 mg/dL — ABNORMAL HIGH (ref 7.0–26.0)
CO2: 26 meq/L (ref 22–29)
Calcium: 9.9 mg/dL (ref 8.4–10.4)
Chloride: 106 mEq/L (ref 98–109)
Creatinine: 1.4 mg/dL — ABNORMAL HIGH (ref 0.6–1.1)
GLUCOSE: 165 mg/dL — AB (ref 70–140)
Potassium: 4.2 mEq/L (ref 3.5–5.1)
SODIUM: 142 meq/L (ref 136–145)
TOTAL PROTEIN: 6.2 g/dL — AB (ref 6.4–8.3)
Total Bilirubin: 0.47 mg/dL (ref 0.20–1.20)

## 2013-06-17 LAB — CBC WITH DIFFERENTIAL/PLATELET
BASO%: 0.3 % (ref 0.0–2.0)
Basophils Absolute: 0 10*3/uL (ref 0.0–0.1)
EOS%: 3.5 % (ref 0.0–7.0)
Eosinophils Absolute: 0.2 10*3/uL (ref 0.0–0.5)
HEMATOCRIT: 35.9 % (ref 34.8–46.6)
HGB: 11.8 g/dL (ref 11.6–15.9)
LYMPH%: 9.9 % — ABNORMAL LOW (ref 14.0–49.7)
MCH: 31.3 pg (ref 25.1–34.0)
MCHC: 32.8 g/dL (ref 31.5–36.0)
MCV: 95.5 fL (ref 79.5–101.0)
MONO#: 0.7 10*3/uL (ref 0.1–0.9)
MONO%: 11 % (ref 0.0–14.0)
NEUT#: 4.9 10*3/uL (ref 1.5–6.5)
NEUT%: 75.3 % (ref 38.4–76.8)
PLATELETS: 198 10*3/uL (ref 145–400)
RBC: 3.75 10*6/uL (ref 3.70–5.45)
RDW: 20.1 % — ABNORMAL HIGH (ref 11.2–14.5)
WBC: 6.5 10*3/uL (ref 3.9–10.3)
lymph#: 0.6 10*3/uL — ABNORMAL LOW (ref 0.9–3.3)

## 2013-06-17 MED ORDER — SODIUM CHLORIDE 0.9 % IV SOLN
197.4000 mg | Freq: Once | INTRAVENOUS | Status: AC
  Administered 2013-06-17: 200 mg via INTRAVENOUS
  Filled 2013-06-17: qty 20

## 2013-06-17 MED ORDER — SODIUM CHLORIDE 0.9 % IJ SOLN
10.0000 mL | INTRAMUSCULAR | Status: DC | PRN
  Administered 2013-06-17: 10 mL
  Filled 2013-06-17: qty 10

## 2013-06-17 MED ORDER — SODIUM CHLORIDE 0.9 % IV SOLN
Freq: Once | INTRAVENOUS | Status: AC
  Administered 2013-06-17: 11:00:00 via INTRAVENOUS

## 2013-06-17 MED ORDER — HEPARIN SOD (PORK) LOCK FLUSH 100 UNIT/ML IV SOLN
500.0000 [IU] | Freq: Once | INTRAVENOUS | Status: AC | PRN
  Administered 2013-06-17: 500 [IU]
  Filled 2013-06-17: qty 5

## 2013-06-17 MED ORDER — SODIUM CHLORIDE 0.9 % IV SOLN
600.0000 mg/m2 | Freq: Once | INTRAVENOUS | Status: AC
  Administered 2013-06-17: 1120 mg via INTRAVENOUS
  Filled 2013-06-17: qty 56

## 2013-06-17 MED ORDER — DEXAMETHASONE SODIUM PHOSPHATE 20 MG/5ML IJ SOLN
20.0000 mg | Freq: Once | INTRAMUSCULAR | Status: AC
  Administered 2013-06-17: 20 mg via INTRAVENOUS

## 2013-06-17 MED ORDER — ONDANSETRON 16 MG/50ML IVPB (CHCC)
INTRAVENOUS | Status: AC
  Filled 2013-06-17: qty 16

## 2013-06-17 MED ORDER — DEXAMETHASONE SODIUM PHOSPHATE 20 MG/5ML IJ SOLN
INTRAMUSCULAR | Status: AC
  Filled 2013-06-17: qty 5

## 2013-06-17 MED ORDER — LIDOCAINE-PRILOCAINE 2.5-2.5 % EX CREA: 1.0000 "application " | TOPICAL_CREAM | CUTANEOUS | Status: AC | PRN

## 2013-06-17 MED ORDER — ONDANSETRON 16 MG/50ML IVPB (CHCC)
16.0000 mg | Freq: Once | INTRAVENOUS | Status: AC
  Administered 2013-06-17: 16 mg via INTRAVENOUS

## 2013-06-17 NOTE — Progress Notes (Signed)
Ok to treat today with creatine of 1.4 per Dr. Jana Hakim.

## 2013-06-17 NOTE — Patient Instructions (Signed)
[facility] Discharge Instructions for Patients Receiving Chemotherapy  Today you received the following chemotherapy agents cytoxan/carboplatin,   To help prevent nausea and vomiting after your treatment, we encourage you to take your nausea medication as directed.    If you develop nausea and vomiting that is not controlled by your nausea medication, call the clinic.   BELOW ARE SYMPTOMS THAT SHOULD BE REPORTED IMMEDIATELY:  *FEVER GREATER THAN 100.5 F  *CHILLS WITH OR WITHOUT FEVER  NAUSEA AND VOMITING THAT IS NOT CONTROLLED WITH YOUR NAUSEA MEDICATION  *UNUSUAL SHORTNESS OF BREATH  *UNUSUAL BRUISING OR BLEEDING  TENDERNESS IN MOUTH AND THROAT WITH OR WITHOUT PRESENCE OF ULCERS  *URINARY PROBLEMS  *BOWEL PROBLEMS  UNUSUAL RASH Items with * indicate a potential emergency and should be followed up as soon as possible.  Feel free to call the clinic you have any questions or concerns. The clinic phone number is [phone].

## 2013-06-17 NOTE — Progress Notes (Signed)
ID: Regina West   DOB: 10-08-1929  MR#: [id number]  [id number]  PCP: [name], MD GYN: SU: Stephan Minister, MD OTHER MD: Larey Seat, MD  CHIEF COMPLAINT:  Recurrent ovarian cancer  HISTORY OF PRESENT ILLNESS: The patient originally presented in the summer of 2010 with cramps and abdominal distension.  I do not have a copy of the initial evaluation, but on November 26, 2008, the patient underwent optimal debulking with bilateral salpingo-oophorectomy, omentectomy, and the placement of an intraperitoneal port.  The pathology from that procedure (Accession Number :[id number] at [facility]) showed first of all, significant involvement of the omentum, minimal involvement of the right ovary and right fallopian tube and negative on the left ovary and fallopian tube, with neither ovary being enlarged, consistent with a primary peritoneal serous adenocarcinoma, described as moderately differentiated.  The sample included no lymph nodes.    The patient had an intraperitoneal port placed in the same surgery and was treated with intraperitoneal and IV chemotherapy according to GOG-252 but I am not sure which arm she was in.  (Arm 2 did carboplatin intraperitoneally and Taxol IV.  Arm 3 did cisplatin and paclitaxel intraperitoneally with paclitaxel IV.)  All arms received bevacizumab.  Unfortunately, after 4 cycles of treatment, she had acute mental status changes and was admitted here January of 2011 with what proved to be posterior reversible leukoencephalopathy and SIADH.  She had seizures, aphasia, and required intubation.  Once the patient recovered from this, she was treated with 2 cycles of single-agent carboplatin (I do not have the AUC). Her last adjuvant treatment was May 12, 2009, and her CA-125 at that time was 10.3.  Her intraperitoneal port was removed in April of 2011.    More recently, the patient was in routine follow up when her CA-125 was found to jump up to 2,269.7 (March 26, 2010).   This is 10 months after her last prior chemotherapy.  She had CTs of the chest, abdomen and pelvis January 26th which showed ascites and enhancing peritoneal nodularity. She had had similar findings at presentation but these had completely resolved by the time she finished treatment in March of 2011. The patient was felt to be in first relapse.  Subsequent treatments are as summarized below   INTERVAL HISTORY:  Regina West returns today with her daughter for followup of Regina West ovarian cancer. Today is day 1 cycle 1 of her new protocol, which will consist of cyclophosphamide and carboplatin.  REVIEW OF SYSTEMS:  Regina West is getting used to [place]. She never uses EMLA cream for the port but requests a refill because he she uses it for her toe, which sometimes hurts. She has a little bit of a dry cough which she attributes to seasonal allergies. She had a little pain in her left knee last night. Otherwise bowel movements are fairly normal, she her urine is slow but otherwise unremarkable and she thinks she needs to drink a little bit more fluid. A detailed review of systems was otherwise noncontributory  PAST MEDICAL HISTORY: Significant for hysterectomy at the age of 40 with concurrent bladder "tuck up."  She also underwent appendectomy during that procedure.  She is status post prior bilateral cataract surgery. She is status post tonsillectomy and adenoidectomy.  She has a history of hypertension and diabetes. She has a history of diverticulosis.  She has a large hiatal hernia.  She has degenerative disk disease.  She has been noted to have coronary calcifications and she has hyperlipidemia.  There is  a history of remote tobacco abuse. Seizure disorder as per HPI  FAMILY HISTORY The patient's father died at the age of 48 after heart surgery for aortic stenosis.  The patient's mother died at the age of 13.  The patient has one brother in good health.  There is no other family history to her knowledge of  ovarian or breast cancer.    GYNECOLOGIC HISTORY: Menarche at age 61.  She is GX P3 with menopause in her late 65s.  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 She is currently residing in [place]. Her husband was in sales and had a history of Parkinson's disease.  He died following a fall in the year 2000.  The patient's son Regina West lives in [place], and has a history of MS.  Daughter Regina West lives in [place]. She is a Agricultural engineer.  Son Regina West lives in [place], [place] and is in Press photographer. The patient has 8 grandchildren. She attends the [employer].  She is a good friend of our patients J. and [place] DIRECTIVES: in place  HEALTH MAINTENANCE:  (Updated December 2014) History  Substance Use Topics  . Smoking status: Former Smoker    Quit date: 05/23/1964  . Smokeless tobacco: Never Used  . Alcohol Use: 0.0 oz/week     Comment: rarely     Colonoscopy: 2008  PAP: UTD  MM:  Declines   Allergies  Allergen Reactions  . Avastin [Bevacizumab] Other (See Comments)    Swelling of the brain     Current Outpatient Prescriptions  Medication Sig Dispense Refill  . acyclovir (ZOVIRAX) 400 MG tablet Take 1 tablet (400 mg total) by mouth 2 (two) times daily.  60 tablet  12  . antiseptic oral rinse (BIOTENE) LIQD 15 mLs by Mouth Rinse route 2 (two) times daily.  1 Bottle  0  . cholecalciferol (VITAMIN D) 1000 UNITS tablet Take 1,000 Units by mouth 2 (two) times daily.      Marland Kitchen dexamethasone (DECADRON) 4 MG tablet Take 2 tablets (8 mg total) by mouth 2 (two) times daily with a meal. Start the day after chemotherapy for 3 days.  30 tablet  1  . levETIRAcetam (KEPPRA) 250 MG tablet Take 1 tablet (250 mg total) by mouth 2 (two) times daily.  60 tablet  11  . nitrofurantoin (MACRODANTIN) 50 MG capsule TAKE TWO CAPSULES BY MOUTH ONCE DAILY  60  capsule  3  . omeprazole (PRILOSEC) 40 MG capsule Take 1 capsule (40 mg total) by mouth at bedtime.  30 capsule  2  . ondansetron (ZOFRAN) 8 MG tablet Take 1 tablet (8 mg total) by mouth 2 (two) times daily. Take two times a day starting the day after chemo for 2 days. Then take two times a day as needed for nausea or vomiting.  30 tablet  1  . ondansetron (ZOFRAN) 8 MG tablet Take 1 tablet (8 mg total) by mouth 2 (two) times daily. Start the day after chemo for 3 days. Then take as needed for nausea or vomiting.  30 tablet  1  . prochlorperazine (COMPAZINE) 10 MG tablet Take 1 tablet (10 mg total) by mouth every 6 (six) hours as needed (Nausea or vomiting).  30 tablet  1  . VIIBRYD 40 MG TABS        No current facility-administered medications  for this visit.    OBJECTIVE: Elderly white woman who appears her stated age 49 Vitals:   06/17/13 1021  BP: 119/72  Pulse: 78  Temp: 98.8 F (37.1 C)  Resp: 18     Body mass index is 27.11 kg/(m^2).    ECOG FS: 1 Filed Weights   06/17/13 1021  Weight: 167 lb 14.4 oz (76.159 kg)   Sclerae unicteric, pupils equal and reactive Oropharynx clear and moist No cervical or supraclavicular adenopathy Lungs no rales or rhonchi Heart regular rate and rhythm Abd soft, nontender, positive bowel sounds, no masses palpated MSK no focal spinal tenderness Neuro: nonfocal, well oriented, pleasant affect Breasts:  deferred Skin: Port easily palpable in the anterior chest wall   LAB RESULTS: Results for DRUCILLA, CUMBER (MRN [id number]) as of 06/05/2013 18:35  Ref. Range 03/22/2013 10:23 04/01/2013 10:49 04/22/2013 10:23 05/06/2013 10:01 05/31/2013 12:10  CA 125 Latest Range: 0.0-30.2 U/mL 23.7 24.8 30.8 (H) 26.2 115.6 (H)    Lab Results  Component Value Date   WBC 6.5 06/17/2013   NEUTROABS 4.9 06/17/2013   HGB 11.8 06/17/2013   HCT 35.9 06/17/2013   MCV 95.5 06/17/2013   PLT 198 06/17/2013      Chemistry      Component Value Date/Time   NA 136  05/31/2013 1210   NA 136 10/17/2011 1118   NA 143 05/12/2011 1337   K 4.9 05/31/2013 1210   K 3.9 10/17/2011 1118   K 4.1 05/12/2011 1337   CL 100 08/20/2012 1124   CL 100 10/17/2011 1118   CL 92* 05/12/2011 1337   CO2 23 05/31/2013 1210   CO2 27 10/17/2011 1118   CO2 32 05/12/2011 1337   West 25.0 05/31/2013 1210   West 25* 10/17/2011 1118   West 19 05/12/2011 1337   CREATININE 1.3* 05/31/2013 1210   CREATININE 1.10 10/17/2011 1118   CREATININE 1.3* 05/12/2011 1337      Component Value Date/Time   CALCIUM 10.1 05/31/2013 1210   CALCIUM 9.8 10/17/2011 1118   CALCIUM 9.8 05/12/2011 1337   ALKPHOS 100 05/31/2013 1210   ALKPHOS 109 10/17/2011 1118   ALKPHOS 95* 05/12/2011 1337   AST 23 05/31/2013 1210   AST 22 10/17/2011 1118   AST 40* 05/12/2011 1337   ALT 21 05/31/2013 1210   ALT 14 10/17/2011 1118   ALT 28 05/12/2011 1337   BILITOT 0.41 05/31/2013 1210   BILITOT 0.4 10/17/2011 1118   BILITOT 0.60 05/12/2011 1337      STUDIES: No results found.   ASSESSMENT: 80 y.o.  [place] woman   (1) status post optimal debulking of a primary peritoneal serous adenocarcinoma September 2010, the tumor being moderately differentiated, pT3c NX (stage IIIC), treated according to GOG 252 with  intraperitoneal platinum and paclitaxel x4 given along with bevacizumab, complicated by SIADH after cycle 4, also with posterior reversible leukoencephalopathy.  After these problems resolved she received 2 additional cycles of single-agent carboplatin completed in March 2011  (2)  She had her first recurrence February 2012  treated with carboplatin and Gemzar for a total of 6 cycles between February and June 2012.  Her CEA-125 had normalized before the beginning of cycle 5.   (3) second recurrence documented February 2013,  treated with carboplatin/ doxil x 6, completed 10/03/2011, with a good response noted on restaging scans  (4) third recurrence documented February 2014, receiving single-agent carboplatin every 3 weeks, first dose  04/30/2012, completed 08/20/2012  (5) fourth recurrence documented by  abdominal symptoms and a rising CA 125 November 2014, receiving Abraxane, initially on days one and 8 of each 21 day cycle, with first dose given on 01/16/2013; starting 04/22/2013 she received the Abraxane every 2 weeks--rising CA 125 noted 05/31/2013  (6) starting cyclophosphamide/ carboplatin 06/17/2013, to be repeated every 21 days at significantly reduced carboplatin doses  PLAN:  Nikeia is ready to start her first cycle of cyclophosphamide/ carboplatin, at significantly reduced carboplatin doses. She will take prochlorperazine this evening and tomorrow and then tomorrow morning she will start the dexamethasone and Zofran as per the usual protocol and continue for 3 days.  She knows to call for any problems that may develop before her next visit here which will be on Friday. In particular she feels she is becoming dehydrated we can easily provide intravenous fluids during the working hours without her having to go to the emergency room.    Chauncey Cruel, MD      06/17/2013

## 2013-06-19 ENCOUNTER — Telehealth: Payer: Self-pay | Admitting: *Deleted

## 2013-06-19 NOTE — Telephone Encounter (Signed)
Spoke with pt today for post chemo follow up call.  Pt stated she was doing fine.  Denied nausea/vomiting,  Stated good appetite, and drinking lots of fluids as tolerated.  Stated bowel and bladder function fine.  Pt stated she is taking antiemetics as instructed by Dr. Jana Hakim, and have no nausea symptoms.  Pt aware of next appt on 06/21/13. Pt stated she had very good experience with her new changed chemo.  Staff were courteous, nice, and provided explanations to pt throughout her treatment.  No other concerns at this time.

## 2013-06-20 ENCOUNTER — Encounter: Payer: Self-pay | Admitting: *Deleted

## 2013-06-21 ENCOUNTER — Ambulatory Visit (HOSPITAL_BASED_OUTPATIENT_CLINIC_OR_DEPARTMENT_OTHER): Payer: Medicare Other | Admitting: Oncology

## 2013-06-21 ENCOUNTER — Other Ambulatory Visit (HOSPITAL_BASED_OUTPATIENT_CLINIC_OR_DEPARTMENT_OTHER): Payer: Medicare Other

## 2013-06-21 ENCOUNTER — Telehealth: Payer: Self-pay | Admitting: Oncology

## 2013-06-21 VITALS — BP 125/78 | HR 87 | Temp 98.4°F | Resp 18 | Ht 66.0 in | Wt 168.4 lb

## 2013-06-21 DIAGNOSIS — G3189 Other specified degenerative diseases of nervous system: Secondary | ICD-10-CM

## 2013-06-21 DIAGNOSIS — E871 Hypo-osmolality and hyponatremia: Secondary | ICD-10-CM

## 2013-06-21 DIAGNOSIS — C482 Malignant neoplasm of peritoneum, unspecified: Secondary | ICD-10-CM

## 2013-06-21 DIAGNOSIS — D649 Anemia, unspecified: Secondary | ICD-10-CM

## 2013-06-21 DIAGNOSIS — G40909 Epilepsy, unspecified, not intractable, without status epilepticus: Secondary | ICD-10-CM

## 2013-06-21 DIAGNOSIS — C801 Malignant (primary) neoplasm, unspecified: Secondary | ICD-10-CM

## 2013-06-21 DIAGNOSIS — G63 Polyneuropathy in diseases classified elsewhere: Secondary | ICD-10-CM

## 2013-06-21 DIAGNOSIS — G8929 Other chronic pain: Secondary | ICD-10-CM

## 2013-06-21 DIAGNOSIS — I951 Orthostatic hypotension: Secondary | ICD-10-CM

## 2013-06-21 DIAGNOSIS — M25569 Pain in unspecified knee: Secondary | ICD-10-CM

## 2013-06-21 DIAGNOSIS — C569 Malignant neoplasm of unspecified ovary: Secondary | ICD-10-CM

## 2013-06-21 LAB — CBC WITH DIFFERENTIAL/PLATELET
BASO%: 0 % (ref 0.0–2.0)
BASOS ABS: 0 10*3/uL (ref 0.0–0.1)
EOS ABS: 0 10*3/uL (ref 0.0–0.5)
EOS%: 0 % (ref 0.0–7.0)
HEMATOCRIT: 33.2 % — AB (ref 34.8–46.6)
HGB: 10.9 g/dL — ABNORMAL LOW (ref 11.6–15.9)
LYMPH#: 0.2 10*3/uL — AB (ref 0.9–3.3)
LYMPH%: 3.2 % — ABNORMAL LOW (ref 14.0–49.7)
MCH: 31.6 pg (ref 25.1–34.0)
MCHC: 32.7 g/dL (ref 31.5–36.0)
MCV: 96.5 fL (ref 79.5–101.0)
MONO#: 0.2 10*3/uL (ref 0.1–0.9)
MONO%: 3 % (ref 0.0–14.0)
NEUT#: 5.4 10*3/uL (ref 1.5–6.5)
NEUT%: 93.8 % — ABNORMAL HIGH (ref 38.4–76.8)
Platelets: 205 10*3/uL (ref 145–400)
RBC: 3.44 10*6/uL — ABNORMAL LOW (ref 3.70–5.45)
RDW: 20.2 % — ABNORMAL HIGH (ref 11.2–14.5)
WBC: 5.7 10*3/uL (ref 3.9–10.3)

## 2013-06-21 LAB — COMPREHENSIVE METABOLIC PANEL (CC13)
ALBUMIN: 3.8 g/dL (ref 3.5–5.0)
ALT: 24 U/L (ref 0–55)
ANION GAP: 12 meq/L — AB (ref 3–11)
AST: 21 U/L (ref 5–34)
Alkaline Phosphatase: 94 U/L (ref 40–150)
BUN: 39.3 mg/dL — AB (ref 7.0–26.0)
CO2: 23 meq/L (ref 22–29)
Calcium: 9.8 mg/dL (ref 8.4–10.4)
Chloride: 100 mEq/L (ref 98–109)
Creatinine: 1.3 mg/dL — ABNORMAL HIGH (ref 0.6–1.1)
GLUCOSE: 287 mg/dL — AB (ref 70–140)
POTASSIUM: 4.5 meq/L (ref 3.5–5.1)
Sodium: 135 mEq/L — ABNORMAL LOW (ref 136–145)
Total Bilirubin: 0.7 mg/dL (ref 0.20–1.20)
Total Protein: 6.6 g/dL (ref 6.4–8.3)

## 2013-06-21 NOTE — Progress Notes (Signed)
ID: Louie Bun   DOB: Dec 16, 1929  MR#: [id number]  [id number]  PCP: [name], MD GYN: SU: Stephan Minister, MD OTHER MD: Larey Seat, MD  CHIEF COMPLAINT:  Recurrent ovarian cancer  HISTORY OF PRESENT ILLNESS: The patient originally presented in the summer of 2010 with cramps and abdominal distension.  I do not have a copy of the initial evaluation, but on November 26, 2008, the patient underwent optimal debulking with bilateral salpingo-oophorectomy, omentectomy, and the placement of an intraperitoneal port.  The pathology from that procedure (Accession Number [id number] at [facility]) showed first of all, significant involvement of the omentum, minimal involvement of the right ovary and right fallopian tube and negative on the left ovary and fallopian tube, with neither ovary being enlarged, consistent with a primary peritoneal serous adenocarcinoma, described as moderately differentiated.  The sample included no lymph nodes.    The patient had an intraperitoneal port placed in the same surgery and was treated with intraperitoneal and IV chemotherapy according to GOG-252 but I am not sure which arm she was in.  (Arm 2 did carboplatin intraperitoneally and Taxol IV.  Arm 3 did cisplatin and paclitaxel intraperitoneally with paclitaxel IV.)  All arms received bevacizumab.  Unfortunately, after 4 cycles of treatment, she had acute mental status changes and was admitted here January of 2011 with what proved to be posterior reversible leukoencephalopathy and SIADH.  She had seizures, aphasia, and required intubation.  Once the patient recovered from this, she was treated with 2 cycles of single-agent carboplatin (I do not have the AUC). Her last adjuvant treatment was May 12, 2009, and her CA-125 at that time was 10.3.  Her intraperitoneal port was removed in April of 2011.    More recently, the patient was in routine follow up when her CA-125 was found to jump up to 2,269.7 (March 26, 2010).   This is 10 months after her last prior chemotherapy.  She had CTs of the chest, abdomen and pelvis January 26th which showed ascites and enhancing peritoneal nodularity. She had had similar findings at presentation but these had completely resolved by the time she finished treatment in March of 2011. The patient was felt to be in first relapse.  Subsequent treatments are as summarized below   INTERVAL HISTORY:  Turkessa returns today for followup of Arleny's ovarian cancer. Today is day 5 cycle 1 of cyclophosphamide and carboplatin. We did not use Neulasta on day 2. We are going to be following her labwork very closely as well as her tolerance to decide whether or not to increase the dose of carboplatin in particular  REVIEW OF SYSTEMS:  Michalina did terrific with this treatment. She tells me she was "flying high" for 3 days, and doubtless due to the steroids. Now she is feeling "about back to baseline". She had absolutely no nausea or vomiting. She can be a little constipated, but uses prune juice for that. Her vision is not what it should be, or ankles swell sometimes, she has stress urinary incontinence chronically, she feels forgetful and anxious but not depressed. Overall a detailed review of systems was remarkably stable  PAST MEDICAL HISTORY: Significant for hysterectomy at the age of 80 with concurrent bladder "tuck up."  She also underwent appendectomy during that procedure.  She is status post prior bilateral cataract surgery. She is status post tonsillectomy and adenoidectomy.  She has a history of hypertension and diabetes. She has a history of diverticulosis.  She has a large hiatal hernia.  She has degenerative disk disease.  She has been noted to have coronary calcifications and she has hyperlipidemia.  There is a history of remote tobacco abuse. Seizure disorder as per HPI  FAMILY HISTORY The patient's father died at the age of 81 after heart surgery for aortic stenosis.  The patient's  mother died at the age of 71.  The patient has one brother in good health.  There is no other family history to her knowledge of ovarian or breast cancer.    GYNECOLOGIC HISTORY: Menarche at age 45.  She is GX P3 with menopause in her late 19s.  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 She is currently residing in [place]. Her husband was in sales and had a history of Parkinson's disease.  He died following a fall in the year 2000.  The patient's son [name] lives in [place], and has a history of MS.  Daughter [name] lives in [place]. She is a Agricultural engineer.  Son [name] lives in [place], [place] and is in Press photographer. The patient has 8 grandchildren. She attends the [employer].  She is a good friend of our patients J. and [place] DIRECTIVES: in place  HEALTH MAINTENANCE:  (Updated December 2014) History  Substance Use Topics  . Smoking status: Former Smoker    Quit date: 05/23/1964  . Smokeless tobacco: Never Used  . Alcohol Use: 0.0 oz/week     Comment: rarely     Colonoscopy: 2008  PAP: UTD  MM:  Declines   Allergies  Allergen Reactions  . Avastin [Bevacizumab] Other (See Comments)    Swelling of the brain     Current Outpatient Prescriptions  Medication Sig Dispense Refill  . acyclovir (ZOVIRAX) 400 MG tablet Take 1 tablet (400 mg total) by mouth 2 (two) times daily.  60 tablet  12  . antiseptic oral rinse (BIOTENE) LIQD 15 mLs by Mouth Rinse route 2 (two) times daily.  1 Bottle  0  . cholecalciferol (VITAMIN D) 1000 UNITS tablet Take 1,000 Units by mouth 2 (two) times daily.      Marland Kitchen dexamethasone (DECADRON) 4 MG tablet Take 2 tablets (8 mg total) by mouth 2 (two) times daily with a meal. Start the day after chemotherapy for 3 days.  30 tablet  1  . levETIRAcetam (KEPPRA) 250 MG tablet Take 1 tablet (250 mg total) by  mouth 2 (two) times daily.  60 tablet  11  . lidocaine-prilocaine (EMLA) cream Apply 1 application topically as needed.  30 g  0  . nitrofurantoin (MACRODANTIN) 50 MG capsule TAKE TWO CAPSULES BY MOUTH ONCE DAILY  60 capsule  3  . omeprazole (PRILOSEC) 40 MG capsule Take 1 capsule (40 mg total) by mouth at bedtime.  30 capsule  2  . ondansetron (ZOFRAN) 8 MG tablet Take 1 tablet (8 mg total) by mouth 2 (two) times daily. Take two times a day starting the day after chemo for 2 days. Then take two times a day as needed for nausea or vomiting.  30 tablet  1  . ondansetron (ZOFRAN) 8 MG tablet Take 1 tablet (8 mg total) by mouth 2 (two) times daily. Start the day after chemo for 3 days. Then take as needed for nausea or vomiting.  30 tablet  1  . prochlorperazine (COMPAZINE) 10 MG tablet Take 1  tablet (10 mg total) by mouth every 6 (six) hours as needed (Nausea or vomiting).  30 tablet  1  . VIIBRYD 40 MG TABS        No current facility-administered medications for this visit.    OBJECTIVE: Elderly white woman in no acute distress Filed Vitals:   06/21/13 1309  BP: 125/78  Pulse: 87  Temp: 98.4 F (36.9 C)  Resp: 18     Body mass index is 27.19 kg/(m^2).    ECOG FS: 1 Filed Weights   06/21/13 1309  Weight: 168 lb 6.4 oz (76.386 kg)   Sclerae unicteric, pupils round and equal Oropharynx clear and moist, no lesions noted No cervical or supraclavicular adenopathy Lungs no rales or rhonchi Heart regular rate and rhythm Abd soft, nontender, positive bowel sounds, no masses palpated MSK no focal spinal tenderness Neuro: nonfocal, well oriented, positive affect Breasts:  deferred   LAB RESULTS: Results for GENINE, BECKETT (MRN [id number]) as of 06/05/2013 18:35  Ref. Range 03/22/2013 10:23 04/01/2013 10:49 04/22/2013 10:23 05/06/2013 10:01 05/31/2013 12:10  CA 125 Latest Range: 0.0-30.2 U/mL 23.7 24.8 30.8 (H) 26.2 115.6 (H)    Lab Results  Component Value Date   WBC 5.7 06/21/2013    NEUTROABS 5.4 06/21/2013   HGB 10.9* 06/21/2013   HCT 33.2* 06/21/2013   MCV 96.5 06/21/2013   PLT 205 06/21/2013      Chemistry      Component Value Date/Time   NA 142 06/17/2013 1004   NA 136 10/17/2011 1118   NA 143 05/12/2011 1337   K 4.2 06/17/2013 1004   K 3.9 10/17/2011 1118   K 4.1 05/12/2011 1337   CL 100 08/20/2012 1124   CL 100 10/17/2011 1118   CL 92* 05/12/2011 1337   CO2 26 06/17/2013 1004   CO2 27 10/17/2011 1118   CO2 32 05/12/2011 1337   BUN 30.1* 06/17/2013 1004   BUN 25* 10/17/2011 1118   BUN 19 05/12/2011 1337   CREATININE 1.4* 06/17/2013 1004   CREATININE 1.10 10/17/2011 1118   CREATININE 1.3* 05/12/2011 1337      Component Value Date/Time   CALCIUM 9.9 06/17/2013 1004   CALCIUM 9.8 10/17/2011 1118   CALCIUM 9.8 05/12/2011 1337   ALKPHOS 104 06/17/2013 1004   ALKPHOS 109 10/17/2011 1118   ALKPHOS 95* 05/12/2011 1337   AST 24 06/17/2013 1004   AST 22 10/17/2011 1118   AST 40* 05/12/2011 1337   ALT 19 06/17/2013 1004   ALT 14 10/17/2011 1118   ALT 28 05/12/2011 1337   BILITOT 0.47 06/17/2013 1004   BILITOT 0.4 10/17/2011 1118   BILITOT 0.60 05/12/2011 1337      STUDIES: No results found.  ASSESSMENT: 80 y.o.  [place] woman   (1) status post optimal debulking of a primary peritoneal serous adenocarcinoma September 2010, the tumor being moderately differentiated, pT3c NX (stage IIIC), treated according to GOG 252 with  intraperitoneal platinum and paclitaxel x4 given along with bevacizumab, complicated by SIADH after cycle 4, also with posterior reversible leukoencephalopathy.  After these problems resolved she received 2 additional cycles of single-agent carboplatin completed in March 2011  (2)  She had her first recurrence February 2012  treated with carboplatin and Gemzar for a total of 6 cycles between February and June 2012.  Her CEA-125 had normalized before the beginning of cycle 5.   (3) second recurrence documented February 2013,  treated with carboplatin/ doxil x 6, completed  10/03/2011, with a good  response noted on restaging scans  (4) third recurrence documented February 2014, receiving single-agent carboplatin every 3 weeks, first dose 04/30/2012, completed 08/20/2012  (5) fourth recurrence documented by abdominal symptoms and a rising CA 125 November 2014, receiving Abraxane, initially on days one and 8 of each 21 day cycle, with first dose given on 01/16/2013; starting 04/22/2013 she received the Abraxane every 2 weeks--rising CA 125 noted 05/31/2013  (6) starting cyclophosphamide/ carboplatin 06/17/2013, to be repeated every 21 days at significantly reduced carboplatin doses  PLAN:  Mabrey tolerated her first cycle of cyclophosphamide and carboplatin remarkably well. I think the reason she felt so good for the first 3 days is that she was actually taking her antinausea medicines, which included steroids. We are going to repeat her lab work on 07/01/2013, where she should be at her nadir or just coming out of it, and she will return to see me May 4 for cycle #2. Of course we will check a CEA 125 at that time. If her counts allow it, particularly her platelet count I will try to increase her carboplatin dose. If we get a good response though, we might continue at the current level.  Jaisha has a good understanding of this plan. She agrees with that. She knows to call for any problems that may develop before her next visit here.   Chauncey Cruel, MD      06/21/2013

## 2013-06-21 NOTE — Telephone Encounter (Signed)
per GM to sch pt @9 :30 on 5/4-printed and gave pt copy of sch

## 2013-06-22 LAB — CA 125: CA 125: 583.6 U/mL — ABNORMAL HIGH (ref 0.0–30.2)

## 2013-06-28 ENCOUNTER — Other Ambulatory Visit: Payer: Self-pay | Admitting: *Deleted

## 2013-06-28 DIAGNOSIS — C482 Malignant neoplasm of peritoneum, unspecified: Secondary | ICD-10-CM

## 2013-06-28 MED ORDER — NITROFURANTOIN MACROCRYSTAL 50 MG PO CAPS: ORAL_CAPSULE | ORAL | Status: DC

## 2013-07-01 ENCOUNTER — Other Ambulatory Visit (HOSPITAL_BASED_OUTPATIENT_CLINIC_OR_DEPARTMENT_OTHER): Payer: Medicare Other

## 2013-07-01 ENCOUNTER — Ambulatory Visit: Payer: Medicare Other

## 2013-07-01 DIAGNOSIS — C569 Malignant neoplasm of unspecified ovary: Secondary | ICD-10-CM

## 2013-07-01 DIAGNOSIS — C482 Malignant neoplasm of peritoneum, unspecified: Secondary | ICD-10-CM

## 2013-07-01 LAB — CBC WITH DIFFERENTIAL/PLATELET
BASO%: 0 % (ref 0.0–2.0)
Basophils Absolute: 0 10*3/uL (ref 0.0–0.1)
EOS ABS: 0 10*3/uL (ref 0.0–0.5)
EOS%: 0.7 % (ref 0.0–7.0)
HCT: 31 % — ABNORMAL LOW (ref 34.8–46.6)
HEMOGLOBIN: 10.2 g/dL — AB (ref 11.6–15.9)
LYMPH#: 0.3 10*3/uL — AB (ref 0.9–3.3)
LYMPH%: 21.4 % (ref 14.0–49.7)
MCH: 31.3 pg (ref 25.1–34.0)
MCHC: 32.9 g/dL (ref 31.5–36.0)
MCV: 95.1 fL (ref 79.5–101.0)
MONO#: 0.5 10*3/uL (ref 0.1–0.9)
MONO%: 32.4 % — ABNORMAL HIGH (ref 0.0–14.0)
NEUT#: 0.7 10*3/uL — ABNORMAL LOW (ref 1.5–6.5)
NEUT%: 45.5 % (ref 38.4–76.8)
Platelets: 89 10*3/uL — ABNORMAL LOW (ref 145–400)
RBC: 3.26 10*6/uL — ABNORMAL LOW (ref 3.70–5.45)
RDW: 17.6 % — AB (ref 11.2–14.5)
WBC: 1.5 10*3/uL — ABNORMAL LOW (ref 3.9–10.3)

## 2013-07-01 LAB — COMPREHENSIVE METABOLIC PANEL (CC13)
ALBUMIN: 3.1 g/dL — AB (ref 3.5–5.0)
ALT: 16 U/L (ref 0–55)
AST: 15 U/L (ref 5–34)
Alkaline Phosphatase: 114 U/L (ref 40–150)
Anion Gap: 9 mEq/L (ref 3–11)
BUN: 22.1 mg/dL (ref 7.0–26.0)
CHLORIDE: 101 meq/L (ref 98–109)
CO2: 25 mEq/L (ref 22–29)
Calcium: 10.1 mg/dL (ref 8.4–10.4)
Creatinine: 1.3 mg/dL — ABNORMAL HIGH (ref 0.6–1.1)
GLUCOSE: 178 mg/dL — AB (ref 70–140)
POTASSIUM: 4.1 meq/L (ref 3.5–5.1)
Sodium: 135 mEq/L — ABNORMAL LOW (ref 136–145)
Total Bilirubin: 0.56 mg/dL (ref 0.20–1.20)
Total Protein: 6.1 g/dL — ABNORMAL LOW (ref 6.4–8.3)

## 2013-07-08 ENCOUNTER — Ambulatory Visit (HOSPITAL_BASED_OUTPATIENT_CLINIC_OR_DEPARTMENT_OTHER): Payer: Medicare Other | Admitting: Oncology

## 2013-07-08 ENCOUNTER — Ambulatory Visit (HOSPITAL_BASED_OUTPATIENT_CLINIC_OR_DEPARTMENT_OTHER): Payer: Medicare Other

## 2013-07-08 ENCOUNTER — Telehealth: Payer: Self-pay | Admitting: Physician Assistant

## 2013-07-08 ENCOUNTER — Telehealth: Payer: Self-pay | Admitting: *Deleted

## 2013-07-08 ENCOUNTER — Other Ambulatory Visit (HOSPITAL_BASED_OUTPATIENT_CLINIC_OR_DEPARTMENT_OTHER): Payer: Medicare Other

## 2013-07-08 VITALS — BP 130/77 | HR 80 | Temp 98.3°F | Resp 20 | Ht 66.0 in | Wt 166.4 lb

## 2013-07-08 DIAGNOSIS — C481 Malignant neoplasm of specified parts of peritoneum: Secondary | ICD-10-CM

## 2013-07-08 DIAGNOSIS — C569 Malignant neoplasm of unspecified ovary: Secondary | ICD-10-CM

## 2013-07-08 DIAGNOSIS — E871 Hypo-osmolality and hyponatremia: Secondary | ICD-10-CM

## 2013-07-08 DIAGNOSIS — I951 Orthostatic hypotension: Secondary | ICD-10-CM

## 2013-07-08 DIAGNOSIS — Z5111 Encounter for antineoplastic chemotherapy: Secondary | ICD-10-CM

## 2013-07-08 DIAGNOSIS — C482 Malignant neoplasm of peritoneum, unspecified: Secondary | ICD-10-CM

## 2013-07-08 DIAGNOSIS — C801 Malignant (primary) neoplasm, unspecified: Secondary | ICD-10-CM

## 2013-07-08 DIAGNOSIS — G40909 Epilepsy, unspecified, not intractable, without status epilepticus: Secondary | ICD-10-CM

## 2013-07-08 DIAGNOSIS — G3189 Other specified degenerative diseases of nervous system: Secondary | ICD-10-CM

## 2013-07-08 DIAGNOSIS — M25569 Pain in unspecified knee: Secondary | ICD-10-CM

## 2013-07-08 DIAGNOSIS — G8929 Other chronic pain: Secondary | ICD-10-CM

## 2013-07-08 DIAGNOSIS — G63 Polyneuropathy in diseases classified elsewhere: Secondary | ICD-10-CM

## 2013-07-08 LAB — COMPREHENSIVE METABOLIC PANEL (CC13)
ALK PHOS: 96 U/L (ref 40–150)
ALT: 20 U/L (ref 0–55)
ANION GAP: 10 meq/L (ref 3–11)
AST: 24 U/L (ref 5–34)
Albumin: 3.3 g/dL — ABNORMAL LOW (ref 3.5–5.0)
BILIRUBIN TOTAL: 0.26 mg/dL (ref 0.20–1.20)
BUN: 19.3 mg/dL (ref 7.0–26.0)
CO2: 23 meq/L (ref 22–29)
Calcium: 10.1 mg/dL (ref 8.4–10.4)
Chloride: 104 mEq/L (ref 98–109)
Creatinine: 1.2 mg/dL — ABNORMAL HIGH (ref 0.6–1.1)
Glucose: 244 mg/dl — ABNORMAL HIGH (ref 70–140)
Potassium: 4.5 mEq/L (ref 3.5–5.1)
SODIUM: 137 meq/L (ref 136–145)
TOTAL PROTEIN: 6.2 g/dL — AB (ref 6.4–8.3)

## 2013-07-08 LAB — CBC WITH DIFFERENTIAL/PLATELET
BASO%: 0.3 % (ref 0.0–2.0)
Basophils Absolute: 0 10*3/uL (ref 0.0–0.1)
EOS%: 0.3 % (ref 0.0–7.0)
Eosinophils Absolute: 0 10*3/uL (ref 0.0–0.5)
HCT: 31.8 % — ABNORMAL LOW (ref 34.8–46.6)
HGB: 10.4 g/dL — ABNORMAL LOW (ref 11.6–15.9)
LYMPH#: 0.4 10*3/uL — AB (ref 0.9–3.3)
LYMPH%: 13.4 % — ABNORMAL LOW (ref 14.0–49.7)
MCH: 31.5 pg (ref 25.1–34.0)
MCHC: 32.7 g/dL (ref 31.5–36.0)
MCV: 96.4 fL (ref 79.5–101.0)
MONO#: 0.5 10*3/uL (ref 0.1–0.9)
MONO%: 14.3 % — ABNORMAL HIGH (ref 0.0–14.0)
NEUT#: 2.4 10*3/uL (ref 1.5–6.5)
NEUT%: 71.7 % (ref 38.4–76.8)
Platelets: 165 10*3/uL (ref 145–400)
RBC: 3.3 10*6/uL — AB (ref 3.70–5.45)
RDW: 17.6 % — ABNORMAL HIGH (ref 11.2–14.5)
WBC: 3.3 10*3/uL — AB (ref 3.9–10.3)

## 2013-07-08 LAB — CA 125: CA 125: 184.4 U/mL — AB (ref 0.0–30.2)

## 2013-07-08 MED ORDER — SODIUM CHLORIDE 0.9 % IV SOLN
Freq: Once | INTRAVENOUS | Status: AC
  Administered 2013-07-08: 11:00:00 via INTRAVENOUS

## 2013-07-08 MED ORDER — ONDANSETRON 16 MG/50ML IVPB (CHCC)
INTRAVENOUS | Status: AC
  Filled 2013-07-08: qty 16

## 2013-07-08 MED ORDER — SODIUM CHLORIDE 0.9 % IV SOLN
600.0000 mg/m2 | Freq: Once | INTRAVENOUS | Status: AC
  Administered 2013-07-08: 1120 mg via INTRAVENOUS
  Filled 2013-07-08: qty 56

## 2013-07-08 MED ORDER — DEXAMETHASONE SODIUM PHOSPHATE 20 MG/5ML IJ SOLN
20.0000 mg | Freq: Once | INTRAMUSCULAR | Status: AC
  Administered 2013-07-08: 20 mg via INTRAVENOUS

## 2013-07-08 MED ORDER — ONDANSETRON 16 MG/50ML IVPB (CHCC)
16.0000 mg | Freq: Once | INTRAVENOUS | Status: AC
  Administered 2013-07-08: 16 mg via INTRAVENOUS

## 2013-07-08 MED ORDER — SODIUM CHLORIDE 0.9 % IJ SOLN
10.0000 mL | INTRAMUSCULAR | Status: DC | PRN
  Administered 2013-07-08: 10 mL
  Filled 2013-07-08: qty 10

## 2013-07-08 MED ORDER — SODIUM CHLORIDE 0.9 % IV SOLN
197.4000 mg | Freq: Once | INTRAVENOUS | Status: AC
  Administered 2013-07-08: 200 mg via INTRAVENOUS
  Filled 2013-07-08: qty 20

## 2013-07-08 MED ORDER — CARBOPLATIN CHEMO INTRADERMAL TEST DOSE 100MCG/0.02ML
100.0000 ug | Freq: Once | INTRADERMAL | Status: AC
  Administered 2013-07-08: 100 ug via INTRADERMAL
  Filled 2013-07-08: qty 0.01

## 2013-07-08 MED ORDER — DEXAMETHASONE SODIUM PHOSPHATE 20 MG/5ML IJ SOLN
INTRAMUSCULAR | Status: AC
  Filled 2013-07-08: qty 5

## 2013-07-08 MED ORDER — HEPARIN SOD (PORK) LOCK FLUSH 100 UNIT/ML IV SOLN
500.0000 [IU] | Freq: Once | INTRAVENOUS | Status: AC | PRN
  Administered 2013-07-08: 500 [IU]
  Filled 2013-07-08: qty 5

## 2013-07-08 NOTE — Progress Notes (Signed)
ID: Regina West   DOB: 03/05/1930  MR#: [id number]  NGE#:[id number]  PCP: Regina Lyons, MD GYN: SU: Regina Minister, MD OTHER MD: Regina Seat, MD  CHIEF COMPLAINT:  Recurrent ovarian cancer  HISTORY OF PRESENT ILLNESS: The patient originally presented in the summer of 2010 with cramps and abdominal distension.  I do not have a copy of the initial evaluation, but on November 26, 2008, the patient underwent optimal debulking with bilateral salpingo-oophorectomy, omentectomy, and the placement of an intraperitoneal port.  The pathology from that procedure (Accession Number [id number] at [facility]) showed first of all, significant involvement of the omentum, minimal involvement of the right ovary and right fallopian tube and negative on the left ovary and fallopian tube, with neither ovary being enlarged, consistent with a primary peritoneal serous adenocarcinoma, described as moderately differentiated.  The sample included no lymph nodes.    The patient had an intraperitoneal port placed in the same surgery and was treated with intraperitoneal and IV chemotherapy according to GOG-252 but I am not sure which arm she was in.  (Arm 2 did carboplatin intraperitoneally and Taxol IV.  Arm 3 did cisplatin and paclitaxel intraperitoneally with paclitaxel IV.)  All arms received bevacizumab.  Unfortunately, after 4 cycles of treatment, she had acute mental status changes and was admitted here January of 2011 with what proved to be posterior reversible leukoencephalopathy and SIADH.  She had seizures, aphasia, and required intubation.  Once the patient recovered from this, she was treated with 2 cycles of single-agent carboplatin (I do not have the AUC). Her last adjuvant treatment was May 12, 2009, and her CA-125 at that time was 10.3.  Her intraperitoneal port was removed in April of 2011.    More recently, the patient was in routine follow up when her CA-125 was found to jump up to 2,269.7 (March 26, 2010).   This is 10 months after her last prior chemotherapy.  She had CTs of the chest, abdomen and pelvis January 26th which showed ascites and enhancing peritoneal nodularity. She had had similar findings at presentation but these had completely resolved by the time she finished treatment in March of 2011. The patient was felt to be in first relapse.  Subsequent treatments are as summarized below   INTERVAL HISTORY:  Regina West returns today for followup of Regina West's ovarian cancer accompanied by her daughter.. Today is day 1 cycle 2 of cyclophosphamide and carboplatin. We did not use Neulasta on day 2.   REVIEW OF SYSTEMS:  Regina West is doing remarkably well overall. Her hair is beginning to come back. She has some fatigue, but certainly no more than before perhaps a little S. She is enjoying this is from her family. She had no significant nausea from the treatments, but 2 weeks after the treatment she did have a little bit of nausea 1 night, which is likely unrelated. There has been no bleeding, no fever, rash, and if anything her bowel movements are now more normal than before. A detailed review of systems today was otherwise noncontributory  PAST MEDICAL HISTORY: Significant for hysterectomy at the age of 19 with concurrent bladder "tuck up."  She also underwent appendectomy during that procedure.  She is status post prior bilateral cataract surgery. She is status post tonsillectomy and adenoidectomy.  She has a history of hypertension and diabetes. She has a history of diverticulosis.  She has a large hiatal hernia.  She has degenerative disk disease.  She has been noted to have coronary calcifications and  she has hyperlipidemia.  There is a history of remote tobacco abuse. Seizure disorder as per HPI  FAMILY HISTORY The patient's father died at the age of 30 after heart surgery for aortic stenosis.  The patient's mother died at the age of 62.  The patient has one brother in good health.  There is no other  family history to her knowledge of ovarian or breast cancer.    GYNECOLOGIC HISTORY: Menarche at age 3.  She is GX P3 with menopause in her late 46s.  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 She is currently residing in [place]. Her husband was in sales and had a history of Parkinson's disease.  He died following a fall in the year 2000.  The patient's son Regina West lives in [place], and has a history of MS.  Daughter Regina West lives in [place]. She is a Agricultural engineer.  Son Regina West lives in [place], [place] and is in Press photographer. The patient has 8 grandchildren. She attends the [employer].  She is a good friend of our patients J. and Regina West DIRECTIVES: in place  HEALTH MAINTENANCE:  (Updated December 2014) History  Substance Use Topics  . Smoking status: Former Smoker    Quit date: 05/23/1964  . Smokeless tobacco: Never Used  . Alcohol Use: 0.0 oz/week     Comment: rarely     Colonoscopy: 2008  PAP: UTD  MM:  Declines   Allergies  Allergen Reactions  . Avastin [Bevacizumab] Other (See Comments)    Swelling of the brain     Current Outpatient Prescriptions  Medication Sig Dispense Refill  . acyclovir (ZOVIRAX) 400 MG tablet Take 1 tablet (400 mg total) by mouth 2 (two) times daily.  60 tablet  12  . antiseptic oral rinse (BIOTENE) LIQD 15 mLs by Mouth Rinse route 2 (two) times daily.  1 Bottle  0  . cholecalciferol (VITAMIN D) 1000 UNITS tablet Take 1,000 Units by mouth 2 (two) times daily.      Marland Kitchen dexamethasone (DECADRON) 4 MG tablet Take 2 tablets (8 mg total) by mouth 2 (two) times daily with a meal. Start the day after chemotherapy for 3 days.  30 tablet  1  . levETIRAcetam (KEPPRA) 250 MG tablet Take 1 tablet (250 mg total) by mouth 2 (two) times daily.  60 tablet  11  . lidocaine-prilocaine (EMLA) cream Apply 1  application topically as needed.  30 g  0  . nitrofurantoin (MACRODANTIN) 50 MG capsule TAKE TWO CAPSULES BY MOUTH ONCE DAILY  60 capsule  3  . omeprazole (PRILOSEC) 40 MG capsule Take 1 capsule (40 mg total) by mouth at bedtime.  30 capsule  2  . ondansetron (ZOFRAN) 8 MG tablet Take 1 tablet (8 mg total) by mouth 2 (two) times daily. Take two times a day starting the day after chemo for 2 days. Then take two times a day as needed for nausea or vomiting.  30 tablet  1  . ondansetron (ZOFRAN) 8 MG tablet Take 1 tablet (8 mg total) by mouth 2 (two) times daily. Start the day after chemo for 3 days. Then take as needed for nausea or vomiting.  30 tablet  1  . prochlorperazine (COMPAZINE) 10 MG tablet Take 1 tablet (10 mg total) by mouth every 6 (six) hours as needed (Nausea or vomiting).  30 tablet  1  . VIIBRYD 40 MG TABS        No current facility-administered medications for this visit.    OBJECTIVE: Elderly white woman who appears stated age  46 Vitals:   07/08/13 0952  BP: 130/77  Pulse: 80  Temp: 98.3 F (36.8 C)  Resp: 20     Body mass index is 26.87 kg/(m^2).    ECOG FS: 1 Filed Weights   07/08/13 0952  Weight: 166 lb 6.4 oz (75.479 kg)   Sclerae unicteric, pupilsequal, round and reactive to light  Oropharynx clear and moist,  teeth in good repair  No cervical or supraclavicular adenopathy Lungs no rales or rhonchi Heart regular rate and rhythm Abd soft, nontender, positive bowel sounds, no masses palpated MSK no focal spinal tenderness Neuro: nonfocal, well oriented, positive affect Breasts:  deferred    Lab Results  Component Value Date   WBC 3.3* 07/08/2013   NEUTROABS 2.4 07/08/2013   HGB 10.4* 07/08/2013   HCT 31.8* 07/08/2013   MCV 96.4 07/08/2013   PLT 165 07/08/2013      Chemistry      Component Value Date/Time   NA 137 07/08/2013 0933   NA 136 10/17/2011 1118   NA 143 05/12/2011 1337   K 4.5 07/08/2013 0933   K 3.9 10/17/2011 1118   K 4.1 05/12/2011 1337   CL 100  08/20/2012 1124   CL 100 10/17/2011 1118   CL 92* 05/12/2011 1337   CO2 23 07/08/2013 0933   CO2 27 10/17/2011 1118   CO2 32 05/12/2011 1337   West 19.3 07/08/2013 0933   West 25* 10/17/2011 1118   West 19 05/12/2011 1337   CREATININE 1.2* 07/08/2013 0933   CREATININE 1.10 10/17/2011 1118   CREATININE 1.3* 05/12/2011 1337      Component Value Date/Time   CALCIUM 10.1 07/08/2013 0933   CALCIUM 9.8 10/17/2011 1118   CALCIUM 9.8 05/12/2011 1337   ALKPHOS 96 07/08/2013 0933   ALKPHOS 109 10/17/2011 1118   ALKPHOS 95* 05/12/2011 1337   AST 24 07/08/2013 0933   AST 22 10/17/2011 1118   AST 40* 05/12/2011 1337   ALT 20 07/08/2013 0933   ALT 14 10/17/2011 1118   ALT 28 05/12/2011 1337   BILITOT 0.26 07/08/2013 0933   BILITOT 0.4 10/17/2011 1118   BILITOT 0.60 05/12/2011 1337      STUDIES: No results found.  ASSESSMENT: 80 y.o.  [place] woman   (1) status post optimal debulking of a primary peritoneal serous adenocarcinoma September 2010, the tumor being moderately differentiated, pT3c NX (stage IIIC), treated according to GOG 252 with  intraperitoneal platinum and paclitaxel x4 given along with bevacizumab, complicated by SIADH after cycle 4, also with posterior reversible leukoencephalopathy.  After these problems resolved she received 2 additional cycles of single-agent carboplatin completed in March 2011  (2)  She had her first recurrence February 2012  treated with carboplatin and Gemzar for a total of 6 cycles between February and June 2012.  Her CEA-125 had normalized before the beginning of cycle 5.   (3) second recurrence documented February 2013,  treated with carboplatin/ doxil x 6, completed 10/03/2011, with a good response noted on restaging scans  (4) third recurrence documented February 2014, receiving single-agent carboplatin every 3 weeks, first dose 04/30/2012, completed 08/20/2012  (5) fourth recurrence documented by abdominal symptoms and a rising CA 125 November 2014, receiving Abraxane, initially on  days one and 8 of each 21 day cycle,  with first dose given on 01/16/2013; starting 04/22/2013 she received the Abraxane every 2 weeks--rising CA 125 noted 05/31/2013  (6) startED cyclophosphamide/ carboplatin 06/17/2013, to be repeated every 21 days at reduced carboplatin doses  PLAN:  Nikelle did remarkably well with her first cycle of carbo/Cytoxan, at reduced doses. Her nadir platelet count was 89,000, so I am not going to increase her carboplatin dose.  Her next treatment should be may 25th, but of course that memorial day. We could move it to the 26th but that is a very difficult day here because of overbooking. Accordingly she will have her third dose 4 weeks from now, on June 1.  I have urged her to get into "my chart" so she can get her CA 125 results without having to call and then wait 4 call back.  Otherwise assuming we get a response by cycles 3 the plan will be for 6-8 cycles of the current treatment. Demetria has a good understanding of the overall plan. She knows to goal of treatment in her case is control. She will call with any problems that may develop before her next visit here.  Chauncey Cruel, MD      07/08/2013

## 2013-07-08 NOTE — Telephone Encounter (Signed)
Per staff message and POF I have scheduled appts.  JMW  

## 2013-07-08 NOTE — Patient Instructions (Addendum)
Regina West Discharge Instructions for Patients Receiving Chemotherapy  Today you received the following chemotherapy agents: Cyclophosphamide (Cytoxan) and Carboplatin   To help prevent nausea and vomiting after your treatment, we encourage you to take your nausea medication as prescribed.  Compazine every 6 hours as needed for nausea Zofran 1 tablet twice a day for 3 days beginning day after chemotherapy Decadron 2 tablets twice a day (with meals) for 3 days beginning day after chemotherapy    If you develop nausea and vomiting that is not controlled by your nausea medication, call the clinic.   BELOW ARE SYMPTOMS THAT SHOULD BE REPORTED IMMEDIATELY:  *FEVER GREATER THAN 100.5 F  *CHILLS WITH OR WITHOUT FEVER  NAUSEA AND VOMITING THAT IS NOT CONTROLLED WITH YOUR NAUSEA MEDICATION  *UNUSUAL SHORTNESS OF BREATH  *UNUSUAL BRUISING OR BLEEDING  TENDERNESS IN MOUTH AND THROAT WITH OR WITHOUT PRESENCE OF ULCERS  *URINARY PROBLEMS  *BOWEL PROBLEMS  UNUSUAL RASH Items with * indicate a potential emergency and should be followed up as soon as possible.  Feel free to call the clinic you have any questions or concerns. The clinic phone number is [phone].

## 2013-07-08 NOTE — Telephone Encounter (Signed)
per pof to sch appts/sent email to MW to sch chemo-adv pt once sch will call & print sch

## 2013-07-08 NOTE — Progress Notes (Signed)
After approx 1 minute of premeds zofran/decadron infused, pharmacy notified this RN to pause premeds to give carbo skin test.  Carbo intradermal dest dose negative at 5, 15, 30 minutes. Pharmacy notified. Premeds restarted.

## 2013-07-12 ENCOUNTER — Ambulatory Visit: Payer: Medicare Other | Admitting: Oncology

## 2013-07-12 ENCOUNTER — Other Ambulatory Visit: Payer: Medicare Other

## 2013-07-15 ENCOUNTER — Other Ambulatory Visit (HOSPITAL_BASED_OUTPATIENT_CLINIC_OR_DEPARTMENT_OTHER): Payer: Medicare Other

## 2013-07-15 ENCOUNTER — Ambulatory Visit: Payer: Medicare Other

## 2013-07-15 DIAGNOSIS — C481 Malignant neoplasm of specified parts of peritoneum: Secondary | ICD-10-CM

## 2013-07-15 DIAGNOSIS — C569 Malignant neoplasm of unspecified ovary: Secondary | ICD-10-CM

## 2013-07-15 LAB — COMPREHENSIVE METABOLIC PANEL (CC13)
ALBUMIN: 3.2 g/dL — AB (ref 3.5–5.0)
ALT: 23 U/L (ref 0–55)
ANION GAP: 9 meq/L (ref 3–11)
AST: 18 U/L (ref 5–34)
Alkaline Phosphatase: 98 U/L (ref 40–150)
BUN: 24.4 mg/dL (ref 7.0–26.0)
CALCIUM: 9.6 mg/dL (ref 8.4–10.4)
CHLORIDE: 102 meq/L (ref 98–109)
CO2: 25 meq/L (ref 22–29)
Creatinine: 1 mg/dL (ref 0.6–1.1)
Glucose: 171 mg/dl — ABNORMAL HIGH (ref 70–140)
POTASSIUM: 4.7 meq/L (ref 3.5–5.1)
Sodium: 136 mEq/L (ref 136–145)
Total Bilirubin: 0.44 mg/dL (ref 0.20–1.20)
Total Protein: 5.8 g/dL — ABNORMAL LOW (ref 6.4–8.3)

## 2013-07-15 LAB — CBC WITH DIFFERENTIAL/PLATELET
BASO%: 0 % (ref 0.0–2.0)
Basophils Absolute: 0 10*3/uL (ref 0.0–0.1)
EOS%: 0 % (ref 0.0–7.0)
Eosinophils Absolute: 0 10*3/uL (ref 0.0–0.5)
HCT: 28.8 % — ABNORMAL LOW (ref 34.8–46.6)
HEMOGLOBIN: 9.7 g/dL — AB (ref 11.6–15.9)
LYMPH%: 20.3 % (ref 14.0–49.7)
MCH: 31.8 pg (ref 25.1–34.0)
MCHC: 33.7 g/dL (ref 31.5–36.0)
MCV: 94.4 fL (ref 79.5–101.0)
MONO#: 0 10*3/uL — ABNORMAL LOW (ref 0.1–0.9)
MONO%: 2.3 % (ref 0.0–14.0)
NEUT#: 1 10*3/uL — ABNORMAL LOW (ref 1.5–6.5)
NEUT%: 77.4 % — AB (ref 38.4–76.8)
Platelets: 177 10*3/uL (ref 145–400)
RBC: 3.05 10*6/uL — ABNORMAL LOW (ref 3.70–5.45)
RDW: 17.3 % — AB (ref 11.2–14.5)
WBC: 1.3 10*3/uL — ABNORMAL LOW (ref 3.9–10.3)
lymph#: 0.3 10*3/uL — ABNORMAL LOW (ref 0.9–3.3)
nRBC: 0 % (ref 0–0)

## 2013-07-30 ENCOUNTER — Ambulatory Visit: Payer: Medicare Other

## 2013-08-05 ENCOUNTER — Other Ambulatory Visit: Payer: Medicare Other

## 2013-08-05 ENCOUNTER — Ambulatory Visit (HOSPITAL_BASED_OUTPATIENT_CLINIC_OR_DEPARTMENT_OTHER): Payer: Medicare Other | Admitting: Oncology

## 2013-08-05 ENCOUNTER — Telehealth: Payer: Self-pay | Admitting: *Deleted

## 2013-08-05 ENCOUNTER — Ambulatory Visit (HOSPITAL_BASED_OUTPATIENT_CLINIC_OR_DEPARTMENT_OTHER): Payer: Medicare Other

## 2013-08-05 ENCOUNTER — Other Ambulatory Visit (HOSPITAL_BASED_OUTPATIENT_CLINIC_OR_DEPARTMENT_OTHER): Payer: Medicare Other

## 2013-08-05 ENCOUNTER — Other Ambulatory Visit: Payer: Self-pay | Admitting: *Deleted

## 2013-08-05 ENCOUNTER — Telehealth: Payer: Self-pay | Admitting: Oncology

## 2013-08-05 VITALS — BP 117/74 | HR 80 | Temp 98.5°F | Resp 18 | Ht 66.0 in | Wt 168.1 lb

## 2013-08-05 DIAGNOSIS — D649 Anemia, unspecified: Secondary | ICD-10-CM

## 2013-08-05 DIAGNOSIS — C482 Malignant neoplasm of peritoneum, unspecified: Secondary | ICD-10-CM

## 2013-08-05 DIAGNOSIS — E871 Hypo-osmolality and hyponatremia: Secondary | ICD-10-CM

## 2013-08-05 DIAGNOSIS — C481 Malignant neoplasm of specified parts of peritoneum: Secondary | ICD-10-CM

## 2013-08-05 DIAGNOSIS — C569 Malignant neoplasm of unspecified ovary: Secondary | ICD-10-CM

## 2013-08-05 DIAGNOSIS — Z5111 Encounter for antineoplastic chemotherapy: Secondary | ICD-10-CM

## 2013-08-05 DIAGNOSIS — G8929 Other chronic pain: Secondary | ICD-10-CM

## 2013-08-05 DIAGNOSIS — M25569 Pain in unspecified knee: Secondary | ICD-10-CM

## 2013-08-05 DIAGNOSIS — G63 Polyneuropathy in diseases classified elsewhere: Secondary | ICD-10-CM

## 2013-08-05 DIAGNOSIS — C801 Malignant (primary) neoplasm, unspecified: Secondary | ICD-10-CM

## 2013-08-05 LAB — CBC WITH DIFFERENTIAL/PLATELET
BASO%: 0.2 % (ref 0.0–2.0)
BASOS ABS: 0 10*3/uL (ref 0.0–0.1)
EOS%: 0.4 % (ref 0.0–7.0)
Eosinophils Absolute: 0 10*3/uL (ref 0.0–0.5)
HEMATOCRIT: 30 % — AB (ref 34.8–46.6)
HGB: 9.8 g/dL — ABNORMAL LOW (ref 11.6–15.9)
LYMPH#: 0.6 10*3/uL — AB (ref 0.9–3.3)
LYMPH%: 12.9 % — ABNORMAL LOW (ref 14.0–49.7)
MCH: 32.8 pg (ref 25.1–34.0)
MCHC: 32.7 g/dL (ref 31.5–36.0)
MCV: 100.3 fL (ref 79.5–101.0)
MONO#: 0.9 10*3/uL (ref 0.1–0.9)
MONO%: 19.2 % — ABNORMAL HIGH (ref 0.0–14.0)
NEUT#: 3 10*3/uL (ref 1.5–6.5)
NEUT%: 67.3 % (ref 38.4–76.8)
PLATELETS: 208 10*3/uL (ref 145–400)
RBC: 2.99 10*6/uL — ABNORMAL LOW (ref 3.70–5.45)
RDW: 19.9 % — ABNORMAL HIGH (ref 11.2–14.5)
WBC: 4.5 10*3/uL (ref 3.9–10.3)

## 2013-08-05 LAB — COMPREHENSIVE METABOLIC PANEL (CC13)
ALT: 14 U/L (ref 0–55)
ANION GAP: 14 meq/L — AB (ref 3–11)
AST: 19 U/L (ref 5–34)
Albumin: 3.5 g/dL (ref 3.5–5.0)
Alkaline Phosphatase: 98 U/L (ref 40–150)
BUN: 25.6 mg/dL (ref 7.0–26.0)
CALCIUM: 9.8 mg/dL (ref 8.4–10.4)
CHLORIDE: 103 meq/L (ref 98–109)
CO2: 21 meq/L — AB (ref 22–29)
CREATININE: 1.3 mg/dL — AB (ref 0.6–1.1)
Glucose: 114 mg/dl (ref 70–140)
Potassium: 4.5 mEq/L (ref 3.5–5.1)
Sodium: 138 mEq/L (ref 136–145)
Total Bilirubin: 0.41 mg/dL (ref 0.20–1.20)
Total Protein: 6.3 g/dL — ABNORMAL LOW (ref 6.4–8.3)

## 2013-08-05 MED ORDER — SODIUM CHLORIDE 0.9 % IV SOLN
Freq: Once | INTRAVENOUS | Status: AC
  Administered 2013-08-05: 14:00:00 via INTRAVENOUS

## 2013-08-05 MED ORDER — DEXAMETHASONE SODIUM PHOSPHATE 20 MG/5ML IJ SOLN
20.0000 mg | Freq: Once | INTRAMUSCULAR | Status: AC
  Administered 2013-08-05: 20 mg via INTRAVENOUS

## 2013-08-05 MED ORDER — DEXAMETHASONE SODIUM PHOSPHATE 20 MG/5ML IJ SOLN
INTRAMUSCULAR | Status: AC
  Filled 2013-08-05: qty 5

## 2013-08-05 MED ORDER — ONDANSETRON 16 MG/50ML IVPB (CHCC)
16.0000 mg | Freq: Once | INTRAVENOUS | Status: AC
  Administered 2013-08-05: 16 mg via INTRAVENOUS

## 2013-08-05 MED ORDER — CARBOPLATIN CHEMO INTRADERMAL TEST DOSE 100MCG/0.02ML
100.0000 ug | Freq: Once | INTRADERMAL | Status: AC
  Administered 2013-08-05: 100 ug via INTRADERMAL
  Filled 2013-08-05: qty 0.01

## 2013-08-05 MED ORDER — SODIUM CHLORIDE 0.9 % IJ SOLN
10.0000 mL | INTRAMUSCULAR | Status: DC | PRN
  Administered 2013-08-05: 10 mL
  Filled 2013-08-05: qty 10

## 2013-08-05 MED ORDER — SODIUM CHLORIDE 0.9 % IV SOLN
600.0000 mg/m2 | Freq: Once | INTRAVENOUS | Status: AC
  Administered 2013-08-05: 1120 mg via INTRAVENOUS
  Filled 2013-08-05: qty 56

## 2013-08-05 MED ORDER — ONDANSETRON 16 MG/50ML IVPB (CHCC)
INTRAVENOUS | Status: AC
  Filled 2013-08-05: qty 16

## 2013-08-05 MED ORDER — HEPARIN SOD (PORK) LOCK FLUSH 100 UNIT/ML IV SOLN
500.0000 [IU] | Freq: Once | INTRAVENOUS | Status: AC | PRN
  Administered 2013-08-05: 500 [IU]
  Filled 2013-08-05: qty 5

## 2013-08-05 MED ORDER — SODIUM CHLORIDE 0.9 % IV SOLN
200.0000 mg | Freq: Once | INTRAVENOUS | Status: AC
  Administered 2013-08-05: 200 mg via INTRAVENOUS
  Filled 2013-08-05: qty 20

## 2013-08-05 NOTE — Telephone Encounter (Signed)
per pof to sch appts cancel labs 6/15-add trmta & MD-gasve daughter Christy Sartorius copy of sch

## 2013-08-05 NOTE — Telephone Encounter (Signed)
Per staff phone call and POF I have schedueld appts.  JMW  

## 2013-08-05 NOTE — Patient Instructions (Signed)
[facility] Discharge Instructions for Patients Receiving Chemotherapy  Today you received the following chemotherapy agents cytoxan/carboplatin.    To help prevent nausea and vomiting after your treatment, we encourage you to take your nausea medication as directed.     If you develop nausea and vomiting that is not controlled by your nausea medication, call the clinic.   BELOW ARE SYMPTOMS THAT SHOULD BE REPORTED IMMEDIATELY:  *FEVER GREATER THAN 100.5 F  *CHILLS WITH OR WITHOUT FEVER  NAUSEA AND VOMITING THAT IS NOT CONTROLLED WITH YOUR NAUSEA MEDICATION  *UNUSUAL SHORTNESS OF BREATH  *UNUSUAL BRUISING OR BLEEDING  TENDERNESS IN MOUTH AND THROAT WITH OR WITHOUT PRESENCE OF ULCERS  *URINARY PROBLEMS  *BOWEL PROBLEMS  UNUSUAL RASH Items with * indicate a potential emergency and should be followed up as soon as possible.  Feel free to call the clinic you have any questions or concerns. The clinic phone number is [phone].  

## 2013-08-06 LAB — CA 125: CA 125: 30.8 U/mL — ABNORMAL HIGH (ref 0.0–30.2)

## 2013-08-06 NOTE — Progress Notes (Signed)
ID: Regina West   DOB: 1930/01/04  MR#: [id number]  CSN#:[id number]  PCP: [name], MD GYN: SU: Stephan Minister, MD OTHER MD: Larey Seat, MD  CHIEF COMPLAINT:  Recurrent ovarian cancer TREATMENT: under active chemotherapy  HISTORY OF PRESENT ILLNESS: The patient originally presented in the summer of 2010 with cramps and abdominal distension.  I do not have a copy of the initial evaluation, but on November 26, 2008, the patient underwent optimal debulking with bilateral salpingo-oophorectomy, omentectomy, and the placement of an intraperitoneal port.  The pathology from that procedure (Accession Number [id number] at [facility]) showed first of all, significant involvement of the omentum, minimal involvement of the right ovary and right fallopian tube and negative on the left ovary and fallopian tube, with neither ovary being enlarged, consistent with a primary peritoneal serous adenocarcinoma, described as moderately differentiated.  The sample included no lymph nodes.    The patient had an intraperitoneal port placed in the same surgery and was treated with intraperitoneal and IV chemotherapy according to GOG-252 but I am not sure which arm she was in.  (Arm 2 did carboplatin intraperitoneally and Taxol IV.  Arm 3 did cisplatin and paclitaxel intraperitoneally with paclitaxel IV.)  All arms received bevacizumab.  Unfortunately, after 4 cycles of treatment, she had acute mental status changes and was admitted here January of 2011 with what proved to be posterior reversible leukoencephalopathy and SIADH.  She had seizures, aphasia, and required intubation.  Once the patient recovered from this, she was treated with 2 cycles of single-agent carboplatin (I do not have the AUC). Her last adjuvant treatment was May 12, 2009, and her CA-125 at that time was 10.3.  Her intraperitoneal port was removed in April of 2011.    More recently, the patient was in routine follow up when her CA-125 was found to  jump up to 2,269.7 (March 26, 2010).  This is 10 months after her last prior chemotherapy.  She had CTs of the chest, abdomen and pelvis January 26th which showed ascites and enhancing peritoneal nodularity. She had had similar findings at presentation but these had completely resolved by the time she finished treatment in March of 2011. The patient was felt to be in first relapse.  Subsequent treatments are as summarized below   INTERVAL HISTORY:  Regina West returns today for followup of Regina West's ovarian cancer accompanied by her daughter. Today is day 1 cycle 3 of cyclophosphamide and carboplatin given every 21 days. We are not using Neulasta on day 2 but followed nadirs carefully and at 700 and 1000 ANC they are acceptable.   REVIEW OF SYSTEMS:  Regina West is tolerating treatment well. Overall she feels she is doing "fine, I do all I have to do". She is not exercising regularly although there is a gym at [employer]. She gets some strange comments from other residents about her lack of hair and eyebrows. She just put up with it. She has had no fever, rash, bleeding, or excessive fatigue. There have been no change in bowel or bladder habits. She is planning a trip to the beach which is going to delay her fourth cycle. At detailed review of systems today was otherwise noncontributory  PAST MEDICAL HISTORY: Significant for hysterectomy at the age of 37 with concurrent bladder "tuck up."  She also underwent appendectomy during that procedure.  She is status post prior bilateral cataract surgery. She is status post tonsillectomy and adenoidectomy.  She has a history of hypertension and diabetes. She has a  history of diverticulosis.  She has a large hiatal hernia.  She has degenerative disk disease.  She has been noted to have coronary calcifications and she has hyperlipidemia.  There is a history of remote tobacco abuse. Seizure disorder as per HPI  FAMILY HISTORY The patient's father died at the age of 47 after  heart surgery for aortic stenosis.  The patient's mother died at the age of 66.  The patient has one brother in good health.  There is no other family history to her knowledge of ovarian or breast cancer.    GYNECOLOGIC HISTORY: Menarche at age 32.  She is GX P3 with menopause in her late 5s.  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 She is currently residing in [place]. Her husband was in sales and had a history of Parkinson's disease.  He died following a fall in the year 2000.  The patient's son Regina West lives in [place], and has a history of MS.  Daughter Regina West lives in [place]. She is a Agricultural engineer.  Son Regina West lives in [place], [place] and is in Press photographer. The patient has 8 grandchildren. She attends the [employer].  She is a good friend of our patients J. and [place] DIRECTIVES: in place  HEALTH MAINTENANCE:  (Updated December 2014) History  Substance Use Topics  . Smoking status: Former Smoker    Quit date: 05/23/1964  . Smokeless tobacco: Never Used  . Alcohol Use: 0.0 oz/week     Comment: rarely     Colonoscopy: 2008  PAP: UTD  MM:  Declines   Allergies  Allergen Reactions  . Avastin [Bevacizumab] Other (See Comments)    Swelling of the brain     Current Outpatient Prescriptions  Medication Sig Dispense Refill  . acyclovir (ZOVIRAX) 400 MG tablet Take 1 tablet (400 mg total) by mouth 2 (two) times daily.  60 tablet  12  . antiseptic oral rinse (BIOTENE) LIQD 15 mLs by Mouth Rinse route 2 (two) times daily.  1 Bottle  0  . cholecalciferol (VITAMIN D) 1000 UNITS tablet Take 1,000 Units by mouth 2 (two) times daily.      Marland Kitchen dexamethasone (DECADRON) 4 MG tablet Take 2 tablets (8 mg total) by mouth 2 (two) times daily with a meal. Start the day after chemotherapy for 3 days.  30 tablet  1  . levETIRAcetam  (KEPPRA) 250 MG tablet Take 1 tablet (250 mg total) by mouth 2 (two) times daily.  60 tablet  11  . lidocaine-prilocaine (EMLA) cream Apply 1 application topically as needed.  30 g  0  . nitrofurantoin (MACRODANTIN) 50 MG capsule TAKE TWO CAPSULES BY MOUTH ONCE DAILY  60 capsule  3  . omeprazole (PRILOSEC) 40 MG capsule Take 1 capsule (40 mg total) by mouth at bedtime.  30 capsule  2  . ondansetron (ZOFRAN) 8 MG tablet Take 1 tablet (8 mg total) by mouth 2 (two) times daily. Start the day after chemo for 3 days. Then take as needed for nausea or vomiting.  30 tablet  1  . prochlorperazine (COMPAZINE) 10 MG tablet Take 1 tablet (10 mg total) by mouth every 6 (six) hours as needed (Nausea or vomiting).  30 tablet  1  . VIIBRYD 40 MG TABS        No current facility-administered medications for  this visit.    OBJECTIVE: Elderly white woman in no acute distress Filed Vitals:   08/05/13 1148  BP: 117/74  Pulse: 80  Temp: 98.5 F (36.9 C)  Resp: 18     Body mass index is 27.15 kg/(m^2).    ECOG FS: 1 Filed Weights   08/05/13 1148  Weight: 168 lb 1.6 oz (76.25 kg)   Sclerae unicteric, EOMs intact Oropharynx clear and moist,  no thrush or other lesions  No cervical or supraclavicular adenopathy Lungs no rales or rhonchi Heart regular rate and rhythm Abd soft, nontender, positive bowel sounds, no masses palpated MSK no focal spinal tenderness Neuro: nonfocal, well oriented, positive affect Breasts:  deferred    Lab Results  Component Value Date   WBC 4.5 08/05/2013   NEUTROABS 3.0 08/05/2013   HGB 9.8* 08/05/2013   HCT 30.0* 08/05/2013   MCV 100.3 08/05/2013   PLT 208 08/05/2013      Chemistry      Component Value Date/Time   NA 138 08/05/2013 1126   NA 136 10/17/2011 1118   NA 143 05/12/2011 1337   K 4.5 08/05/2013 1126   K 3.9 10/17/2011 1118   K 4.1 05/12/2011 1337   CL 100 08/20/2012 1124   CL 100 10/17/2011 1118   CL 92* 05/12/2011 1337   CO2 21* 08/05/2013 1126   CO2 27 10/17/2011 1118    CO2 32 05/12/2011 1337   West 25.6 08/05/2013 1126   West 25* 10/17/2011 1118   West 19 05/12/2011 1337   CREATININE 1.3* 08/05/2013 1126   CREATININE 1.10 10/17/2011 1118   CREATININE 1.3* 05/12/2011 1337      Component Value Date/Time   CALCIUM 9.8 08/05/2013 1126   CALCIUM 9.8 10/17/2011 1118   CALCIUM 9.8 05/12/2011 1337   ALKPHOS 98 08/05/2013 1126   ALKPHOS 109 10/17/2011 1118   ALKPHOS 95* 05/12/2011 1337   AST 19 08/05/2013 1126   AST 22 10/17/2011 1118   AST 40* 05/12/2011 1337   ALT 14 08/05/2013 1126   ALT 14 10/17/2011 1118   ALT 28 05/12/2011 1337   BILITOT 0.41 08/05/2013 1126   BILITOT 0.4 10/17/2011 1118   BILITOT 0.60 05/12/2011 1337     Results for KEYSTAL, BRIDEWELL (MRN [id number]) as of 08/06/2013 08:00  Ref. Range 05/06/2013 10:01 05/31/2013 12:10 06/21/2013 12:56 07/08/2013 09:33 08/05/2013 11:26  CA 125 Latest Range: 0.0-30.2 U/mL 26.2 115.6 (H) 583.6 (H) 184.4 (H) 30.8 (H)   STUDIES: No results found.  ASSESSMENT: 80 y.o.  [place] woman   (1) status post optimal debulking of a primary peritoneal serous adenocarcinoma September 2010, the tumor being moderately differentiated, pT3c NX (stage IIIC), treated according to GOG 252 with  intraperitoneal platinum and paclitaxel x4 given along with bevacizumab, complicated by SIADH after cycle 4, also with posterior reversible leukoencephalopathy.  After these problems resolved she received 2 additional cycles of single-agent carboplatin completed in March 2011  (2)  She had her first recurrence February 2012  treated with carboplatin and Gemzar for a total of 6 cycles between February and June 2012.  Her CEA-125 had normalized before the beginning of cycle 5.   (3) second recurrence documented February 2013,  treated with carboplatin/ doxil x 6, completed 10/03/2011, with a good response noted on restaging scans  (4) third recurrence documented February 2014, receiving single-agent carboplatin every 3 weeks, first dose 04/30/2012, completed  08/20/2012  (5) fourth recurrence documented by abdominal symptoms and a rising CA 125 November  2014, receiving Abraxane, initially on days one and 8 of each 21 day cycle, with first dose given on 01/16/2013; starting 04/22/2013 she received the Abraxane every 2 weeks--rising CA 125 noted 05/31/2013  (6) startED cyclophosphamide/ carboplatin 06/17/2013, to be repeated every 21 days at reduced carboplatin doses  PLAN:  Vaidehi is tolerating her treatments well. We are accepting nadirs between 701,000 because she really does not like the Neulasta, as indeed most patients don't. She is aware of the need to call us for any temperature, or for a rash or bleeding or other symptoms.  She is having a remarkable response, with her CEA 125 normalizing after only 2 cycles of chemotherapy. We had intended to give her 6 cycles and likely that will be enough.  She is planning to be at the beach at that time of her next 3 week treatment would be due, namely June 22. Accordingly her next treatment will be June 29. I have rearranged her treatment dates accordingly.  At this point I am delighted that Caroline is tolerating treatment so well and that she is having such a good response. The patient and her daughter have a good understanding of the overall plan. They agree with it. They know the goal of treatment in this case is control. Reaghan knows to call for any problems that may develop before her next visit here.  Chauncey Cruel, MD      08/06/2013

## 2013-08-12 ENCOUNTER — Ambulatory Visit: Payer: Medicare Other

## 2013-08-19 ENCOUNTER — Other Ambulatory Visit: Payer: Medicare Other

## 2013-08-26 ENCOUNTER — Other Ambulatory Visit: Payer: Self-pay | Admitting: Physician Assistant

## 2013-09-02 ENCOUNTER — Ambulatory Visit (HOSPITAL_BASED_OUTPATIENT_CLINIC_OR_DEPARTMENT_OTHER): Payer: Medicare Other | Admitting: Physician Assistant

## 2013-09-02 ENCOUNTER — Other Ambulatory Visit: Payer: Medicare Other

## 2013-09-02 ENCOUNTER — Encounter: Payer: Self-pay | Admitting: Physician Assistant

## 2013-09-02 ENCOUNTER — Ambulatory Visit: Payer: Medicare Other | Admitting: Oncology

## 2013-09-02 ENCOUNTER — Ambulatory Visit (HOSPITAL_BASED_OUTPATIENT_CLINIC_OR_DEPARTMENT_OTHER): Payer: Medicare Other

## 2013-09-02 VITALS — BP 139/66 | HR 82 | Temp 97.9°F | Resp 18 | Ht 66.0 in | Wt 167.6 lb

## 2013-09-02 DIAGNOSIS — C482 Malignant neoplasm of peritoneum, unspecified: Secondary | ICD-10-CM

## 2013-09-02 DIAGNOSIS — Z5111 Encounter for antineoplastic chemotherapy: Secondary | ICD-10-CM

## 2013-09-02 DIAGNOSIS — C569 Malignant neoplasm of unspecified ovary: Secondary | ICD-10-CM

## 2013-09-02 LAB — CBC WITH DIFFERENTIAL/PLATELET
BASO%: 0.4 % (ref 0.0–2.0)
Basophils Absolute: 0 10*3/uL (ref 0.0–0.1)
EOS%: 0.5 % (ref 0.0–7.0)
Eosinophils Absolute: 0 10*3/uL (ref 0.0–0.5)
HEMATOCRIT: 29.8 % — AB (ref 34.8–46.6)
HGB: 9.8 g/dL — ABNORMAL LOW (ref 11.6–15.9)
LYMPH%: 7.8 % — AB (ref 14.0–49.7)
MCH: 35.2 pg — ABNORMAL HIGH (ref 25.1–34.0)
MCHC: 32.8 g/dL (ref 31.5–36.0)
MCV: 107.3 fL — ABNORMAL HIGH (ref 79.5–101.0)
MONO#: 0.6 10*3/uL (ref 0.1–0.9)
MONO%: 14.2 % — AB (ref 0.0–14.0)
NEUT#: 3.4 10*3/uL (ref 1.5–6.5)
NEUT%: 77.1 % — AB (ref 38.4–76.8)
PLATELETS: 211 10*3/uL (ref 145–400)
RBC: 2.78 10*6/uL — AB (ref 3.70–5.45)
RDW: 19.1 % — ABNORMAL HIGH (ref 11.2–14.5)
WBC: 4.5 10*3/uL (ref 3.9–10.3)
lymph#: 0.3 10*3/uL — ABNORMAL LOW (ref 0.9–3.3)

## 2013-09-02 LAB — COMPREHENSIVE METABOLIC PANEL (CC13)
ALT: 14 U/L (ref 0–55)
ANION GAP: 9 meq/L (ref 3–11)
AST: 17 U/L (ref 5–34)
Albumin: 3.4 g/dL — ABNORMAL LOW (ref 3.5–5.0)
Alkaline Phosphatase: 96 U/L (ref 40–150)
BILIRUBIN TOTAL: 0.38 mg/dL (ref 0.20–1.20)
BUN: 32.6 mg/dL — AB (ref 7.0–26.0)
CHLORIDE: 107 meq/L (ref 98–109)
CO2: 25 mEq/L (ref 22–29)
Calcium: 9.5 mg/dL (ref 8.4–10.4)
Creatinine: 1.3 mg/dL — ABNORMAL HIGH (ref 0.6–1.1)
Glucose: 192 mg/dl — ABNORMAL HIGH (ref 70–140)
Potassium: 4.3 mEq/L (ref 3.5–5.1)
Sodium: 141 mEq/L (ref 136–145)
Total Protein: 6.2 g/dL — ABNORMAL LOW (ref 6.4–8.3)

## 2013-09-02 MED ORDER — SODIUM CHLORIDE 0.9 % IV SOLN
Freq: Once | INTRAVENOUS | Status: AC
  Administered 2013-09-02: 13:00:00 via INTRAVENOUS

## 2013-09-02 MED ORDER — ONDANSETRON 16 MG/50ML IVPB (CHCC)
16.0000 mg | Freq: Once | INTRAVENOUS | Status: AC
  Administered 2013-09-02: 16 mg via INTRAVENOUS

## 2013-09-02 MED ORDER — SODIUM CHLORIDE 0.9 % IV SOLN
197.4000 mg | Freq: Once | INTRAVENOUS | Status: AC
  Administered 2013-09-02: 200 mg via INTRAVENOUS
  Filled 2013-09-02: qty 20

## 2013-09-02 MED ORDER — CARBOPLATIN CHEMO INTRADERMAL TEST DOSE 100MCG/0.02ML
100.0000 ug | Freq: Once | INTRADERMAL | Status: AC
  Administered 2013-09-02: 100 ug via INTRADERMAL
  Filled 2013-09-02: qty 0.01

## 2013-09-02 MED ORDER — CYCLOPHOSPHAMIDE CHEMO INJECTION 1 GM
600.0000 mg/m2 | Freq: Once | INTRAMUSCULAR | Status: AC
  Administered 2013-09-02: 1120 mg via INTRAVENOUS
  Filled 2013-09-02: qty 56

## 2013-09-02 MED ORDER — ONDANSETRON 16 MG/50ML IVPB (CHCC)
INTRAVENOUS | Status: AC
  Filled 2013-09-02: qty 16

## 2013-09-02 MED ORDER — DEXAMETHASONE SODIUM PHOSPHATE 20 MG/5ML IJ SOLN
INTRAMUSCULAR | Status: AC
  Filled 2013-09-02: qty 5

## 2013-09-02 MED ORDER — DEXAMETHASONE SODIUM PHOSPHATE 20 MG/5ML IJ SOLN
20.0000 mg | Freq: Once | INTRAMUSCULAR | Status: AC
  Administered 2013-09-02: 20 mg via INTRAVENOUS

## 2013-09-02 MED ORDER — SODIUM CHLORIDE 0.9 % IJ SOLN
10.0000 mL | INTRAMUSCULAR | Status: DC | PRN
  Administered 2013-09-02: 10 mL
  Filled 2013-09-02: qty 10

## 2013-09-02 MED ORDER — HEPARIN SOD (PORK) LOCK FLUSH 100 UNIT/ML IV SOLN
500.0000 [IU] | Freq: Once | INTRAVENOUS | Status: AC | PRN
  Administered 2013-09-02: 500 [IU]
  Filled 2013-09-02: qty 5

## 2013-09-02 NOTE — Patient Instructions (Signed)
Continue labs and chemotherapy as scheduled Followup with Dr. Jana Hakim on 09/23/2013 as previously scheduled, prior to your next cycle of chemotherapy

## 2013-09-02 NOTE — Progress Notes (Signed)
ID: Regina West   DOB: 05/12/1929  MR#: [id number]  BJS#:[id number]  PCP: [name], MD GYN: SU: Stephan Minister, MD OTHER MD: Larey Seat, MD  CHIEF COMPLAINT:  Recurrent ovarian cancer TREATMENT: under active chemotherapy  HISTORY OF PRESENT ILLNESS: The patient originally presented in the summer of 2010 with cramps and abdominal distension.  I do not have a copy of the initial evaluation, but on November 26, 2008, the patient underwent optimal debulking with bilateral salpingo-oophorectomy, omentectomy, and the placement of an intraperitoneal port.  The pathology from that procedure (Accession Number [id number] at [facility]) showed first of all, significant involvement of the omentum, minimal involvement of the right ovary and right fallopian tube and negative on the left ovary and fallopian tube, with neither ovary being enlarged, consistent with a primary peritoneal serous adenocarcinoma, described as moderately differentiated.  The sample included no lymph nodes.    The patient had an intraperitoneal port placed in the same surgery and was treated with intraperitoneal and IV chemotherapy according to GOG-252 but I am not sure which arm she was in.  (Arm 2 did carboplatin intraperitoneally and Taxol IV.  Arm 3 did cisplatin and paclitaxel intraperitoneally with paclitaxel IV.)  All arms received bevacizumab.  Unfortunately, after 4 cycles of treatment, she had acute mental status changes and was admitted here January of 2011 with what proved to be posterior reversible leukoencephalopathy and SIADH.  She had seizures, aphasia, and required intubation.  Once the patient recovered from this, she was treated with 2 cycles of single-agent carboplatin (I do not have the AUC). Her last adjuvant treatment was May 12, 2009, and her CA-125 at that time was 10.3.  Her intraperitoneal port was removed in April of 2011.    More recently, the patient was in routine follow up when her CA-125 was found to  jump up to 2,269.7 (March 26, 2010).  This is 10 months after her last prior chemotherapy.  She had CTs of the chest, abdomen and pelvis January 26th which showed ascites and enhancing peritoneal nodularity. She had had similar findings at presentation but these had completely resolved by the time she finished treatment in March of 2011. The patient was felt to be in first relapse.  Subsequent treatments are as summarized below   INTERVAL HISTORY:  [name] returns today for followup of [name]'s ovarian cancer accompanied by her daughter. Today is day 1 cycle 4 of cyclophosphamide and carboplatin given every 21 days. Neulasta is not being usedon day 2 but we will continue to closely follow her nadir counts.  REVIEW OF SYSTEMS:  Jan continues to tolerate her treatment well. She notes some mild peripheral neuropathy affecting her hands more so on the right than on the left. She feels that this is at baseline .she voiced no other specific complaints today.  She has had no fever, rash, bleeding, or excessive fatigue. There have been no change in bowel or bladder habits. A detailed review of systems today was otherwise noncontributory  PAST MEDICAL HISTORY: Significant for hysterectomy at the age of 30 with concurrent bladder "tuck up."  She also underwent appendectomy during that procedure.  She is status post prior bilateral cataract surgery. She is status post tonsillectomy and adenoidectomy.  She has a history of hypertension and diabetes. She has a history of diverticulosis.  She has a large hiatal hernia.  She has degenerative disk disease.  She has been noted to have coronary calcifications and she has hyperlipidemia.  There is a  history of remote tobacco abuse. Seizure disorder as per HPI  FAMILY HISTORY The patient's father died at the age of 19 after heart surgery for aortic stenosis.  The patient's mother died at the age of 38.  The patient has one brother in good health.  There is no other  family history to her knowledge of ovarian or breast cancer.    GYNECOLOGIC HISTORY: Menarche at age 26.  She is GX P3 with menopause in her late 61s.  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 She is currently residing in [place]. Her husband was in sales and had a history of Parkinson's disease.  He died following a fall in the year 2000.  The patient's son Regina West lives in [place], and has a history of MS.  Daughter Regina West lives in [place]. She is a Agricultural engineer.  Son Regina West lives in [place], [place] and is in Press photographer. The patient has 8 grandchildren. She attends the [employer].  She is a good friend of our patients J. and [place] DIRECTIVES: in place  HEALTH MAINTENANCE:  (Updated December 2014) History  Substance Use Topics  . Smoking status: Former Smoker    Quit date: 05/23/1964  . Smokeless tobacco: Never Used  . Alcohol Use: 0.0 oz/week     Comment: rarely     Colonoscopy: 2008  PAP: UTD  MM:  Declines   Allergies  Allergen Reactions  . Avastin [Bevacizumab] Other (See Comments)    Swelling of the brain     Current Outpatient Prescriptions  Medication Sig Dispense Refill  . acyclovir (ZOVIRAX) 400 MG tablet Take 1 tablet (400 mg total) by mouth 2 (two) times daily.  60 tablet  12  . antiseptic oral rinse (BIOTENE) LIQD 15 mLs by Mouth Rinse route 2 (two) times daily.  1 Bottle  0  . cholecalciferol (VITAMIN D) 1000 UNITS tablet Take 1,000 Units by mouth 2 (two) times daily.      Marland Kitchen dexamethasone (DECADRON) 4 MG tablet Take 2 tablets (8 mg total) by mouth 2 (two) times daily with a meal. Start the day after chemotherapy for 3 days.  30 tablet  1  . levETIRAcetam (KEPPRA) 250 MG tablet Take 1 tablet (250 mg total) by mouth 2 (two) times daily.  60 tablet  11  . nitrofurantoin (MACRODANTIN) 50 MG capsule TAKE  TWO CAPSULES BY MOUTH ONCE DAILY  60 capsule  3  . omeprazole (PRILOSEC) 40 MG capsule Take 1 capsule (40 mg total) by mouth at bedtime.  30 capsule  2  . ondansetron (ZOFRAN) 8 MG tablet Take 1 tablet (8 mg total) by mouth 2 (two) times daily. Start the day after chemo for 3 days. Then take as needed for nausea or vomiting.  30 tablet  1  . prochlorperazine (COMPAZINE) 10 MG tablet Take 1 tablet (10 mg total) by mouth every 6 (six) hours as needed (Nausea or vomiting).  30 tablet  1  . VIIBRYD 40 MG TABS       . lidocaine-prilocaine (EMLA) cream Apply 1 application topically as needed.  30 g  0   No current facility-administered medications for this visit.   Facility-Administered Medications Ordered in Other Visits  Medication Dose Route Frequency Provider Last Rate Last Dose  . sodium chloride 0.9 % injection 10 mL  10 mL Intracatheter  PRN Chauncey Cruel, MD   10 mL at 09/02/13 1554    OBJECTIVE: Elderly white woman in no acute distress Filed Vitals:   09/02/13 1050  BP: 139/66  Pulse: 82  Temp: 97.9 F (36.6 C)  Resp: 18     Body mass index is 27.06 kg/(m^2).    ECOG FS: 1 Filed Weights   09/02/13 1050  Weight: 167 lb 9.6 oz (76.023 kg)   Sclerae unicteric, EOMs intact Oropharynx clear and moist,  no thrush or other lesions  No cervical or supraclavicular adenopathy Lungs no rales or rhonchi Heart regular rate and rhythm Abd soft, nontender, positive bowel sounds, no masses palpated MSK no focal spinal tenderness Neuro: nonfocal, well oriented, positive affect Breasts:  deferred    Lab Results  Component Value Date   WBC 4.5 09/02/2013   NEUTROABS 3.4 09/02/2013   HGB 9.8* 09/02/2013   HCT 29.8* 09/02/2013   MCV 107.3* 09/02/2013   PLT 211 09/02/2013      Chemistry      Component Value Date/Time   NA 141 09/02/2013 1038   NA 136 10/17/2011 1118   NA 143 05/12/2011 1337   K 4.3 09/02/2013 1038   K 3.9 10/17/2011 1118   K 4.1 05/12/2011 1337   CL 100 08/20/2012 1124    CL 100 10/17/2011 1118   CL 92* 05/12/2011 1337   CO2 25 09/02/2013 1038   CO2 27 10/17/2011 1118   CO2 32 05/12/2011 1337   West 32.6* 09/02/2013 1038   West 25* 10/17/2011 1118   West 19 05/12/2011 1337   CREATININE 1.3* 09/02/2013 1038   CREATININE 1.10 10/17/2011 1118   CREATININE 1.3* 05/12/2011 1337      Component Value Date/Time   CALCIUM 9.5 09/02/2013 1038   CALCIUM 9.8 10/17/2011 1118   CALCIUM 9.8 05/12/2011 1337   ALKPHOS 96 09/02/2013 1038   ALKPHOS 109 10/17/2011 1118   ALKPHOS 95* 05/12/2011 1337   AST 17 09/02/2013 1038   AST 22 10/17/2011 1118   AST 40* 05/12/2011 1337   ALT 14 09/02/2013 1038   ALT 14 10/17/2011 1118   ALT 28 05/12/2011 1337   BILITOT 0.38 09/02/2013 1038   BILITOT 0.4 10/17/2011 1118   BILITOT 0.60 05/12/2011 1337     Results for DEJA, PISARSKI (MRN [id number]) as of 08/06/2013 08:00  Ref. Range 05/06/2013 10:01 05/31/2013 12:10 06/21/2013 12:56 07/08/2013 09:33 08/05/2013 11:26  CA 125 Latest Range: 0.0-30.2 U/mL 26.2 115.6 (H) 583.6 (H) 184.4 (H) 30.8 (H)   STUDIES: No results found.  ASSESSMENT: 80 y.o.  [place] woman   (1) status post optimal debulking of a primary peritoneal serous adenocarcinoma September 2010, the tumor being moderately differentiated, pT3c NX (stage IIIC), treated according to GOG 252 with  intraperitoneal platinum and paclitaxel x4 given along with bevacizumab, complicated by SIADH after cycle 4, also with posterior reversible leukoencephalopathy.  After these problems resolved she received 2 additional cycles of single-agent carboplatin completed in March 2011  (2)  She had her first recurrence February 2012  treated with carboplatin and Gemzar for a total of 6 cycles between February and June 2012.  Her CEA-125 had normalized before the beginning of cycle 5.   (3) second recurrence documented February 2013,  treated with carboplatin/ doxil x 6, completed 10/03/2011, with a good response noted on restaging scans  (4) third recurrence documented  February 2014, receiving single-agent carboplatin every 3 weeks, first dose 04/30/2012, completed 08/20/2012  (5) fourth recurrence  documented by abdominal symptoms and a rising CA 125 November 2014, receiving Abraxane, initially on days one and 8 of each 21 day cycle, with first dose given on 01/16/2013; starting 04/22/2013 she received the Abraxane every 2 weeks--rising CA 125 noted 05/31/2013  (6) startED cyclophosphamide/ carboplatin 06/17/2013, to be repeated every 21 days at reduced carboplatin doses  PLAN:  Adisynn is tolerating her treatments well. We are accepting nadirs between 700 and 1,000 because she really does not like the Neulasta.  She is aware of the need to call us for any temperature, or for a rash or bleeding or other symptoms.  She is having a remarkable response, with her CA 125 normalizing after only 2 cycles of chemotherapy. The CEA 125 on 08/05/2013 was 30.8, the result from today is pending. The plan is to give her 6 cycles. She'll proceed with cycle #4 today.   The patient and her daughter have a good understanding of the overall plan. They agree with it. They know the goal of treatment in this case is control. Kailani knows to call for any problems that may develop before her next visit here.  Carlton Adam, PA-C      09/02/2013

## 2013-09-03 LAB — CA 125: CA 125: 17.1 U/mL (ref 0.0–30.2)

## 2013-09-16 MED ORDER — ONDANSETRON 8 MG/NS 50 ML IVPB
INTRAVENOUS | Status: AC
  Filled 2013-09-16: qty 8

## 2013-09-16 MED ORDER — FAMOTIDINE IN NACL 20-0.9 MG/50ML-% IV SOLN
INTRAVENOUS | Status: AC
  Filled 2013-09-16: qty 50

## 2013-09-16 MED ORDER — DIPHENHYDRAMINE HCL 50 MG/ML IJ SOLN
INTRAMUSCULAR | Status: AC
  Filled 2013-09-16: qty 1

## 2013-09-16 MED ORDER — DEXAMETHASONE SODIUM PHOSPHATE 20 MG/5ML IJ SOLN
INTRAMUSCULAR | Status: AC
  Filled 2013-09-16: qty 5

## 2013-09-20 ENCOUNTER — Other Ambulatory Visit: Payer: Self-pay | Admitting: *Deleted

## 2013-09-20 DIAGNOSIS — C801 Malignant (primary) neoplasm, unspecified: Secondary | ICD-10-CM

## 2013-09-20 DIAGNOSIS — G63 Polyneuropathy in diseases classified elsewhere: Secondary | ICD-10-CM

## 2013-09-20 DIAGNOSIS — C482 Malignant neoplasm of peritoneum, unspecified: Secondary | ICD-10-CM

## 2013-09-20 DIAGNOSIS — G3189 Other specified degenerative diseases of nervous system: Secondary | ICD-10-CM

## 2013-09-23 ENCOUNTER — Ambulatory Visit (HOSPITAL_BASED_OUTPATIENT_CLINIC_OR_DEPARTMENT_OTHER): Payer: Medicare Other

## 2013-09-23 ENCOUNTER — Ambulatory Visit (HOSPITAL_BASED_OUTPATIENT_CLINIC_OR_DEPARTMENT_OTHER): Payer: Medicare Other | Admitting: Oncology

## 2013-09-23 ENCOUNTER — Other Ambulatory Visit (HOSPITAL_BASED_OUTPATIENT_CLINIC_OR_DEPARTMENT_OTHER): Payer: Medicare Other

## 2013-09-23 ENCOUNTER — Ambulatory Visit: Payer: Medicare Other | Admitting: Oncology

## 2013-09-23 ENCOUNTER — Other Ambulatory Visit: Payer: Self-pay | Admitting: *Deleted

## 2013-09-23 ENCOUNTER — Other Ambulatory Visit: Payer: Medicare Other

## 2013-09-23 VITALS — BP 141/68 | HR 91 | Temp 98.2°F | Resp 20 | Ht 66.0 in | Wt 167.5 lb

## 2013-09-23 DIAGNOSIS — C482 Malignant neoplasm of peritoneum, unspecified: Secondary | ICD-10-CM

## 2013-09-23 DIAGNOSIS — R5381 Other malaise: Secondary | ICD-10-CM

## 2013-09-23 DIAGNOSIS — G63 Polyneuropathy in diseases classified elsewhere: Secondary | ICD-10-CM

## 2013-09-23 DIAGNOSIS — K5730 Diverticulosis of large intestine without perforation or abscess without bleeding: Secondary | ICD-10-CM

## 2013-09-23 DIAGNOSIS — C801 Malignant (primary) neoplasm, unspecified: Secondary | ICD-10-CM

## 2013-09-23 DIAGNOSIS — Z5111 Encounter for antineoplastic chemotherapy: Secondary | ICD-10-CM

## 2013-09-23 DIAGNOSIS — E785 Hyperlipidemia, unspecified: Secondary | ICD-10-CM

## 2013-09-23 DIAGNOSIS — C569 Malignant neoplasm of unspecified ovary: Secondary | ICD-10-CM

## 2013-09-23 DIAGNOSIS — I10 Essential (primary) hypertension: Secondary | ICD-10-CM

## 2013-09-23 DIAGNOSIS — R5383 Other fatigue: Secondary | ICD-10-CM

## 2013-09-23 DIAGNOSIS — G3189 Other specified degenerative diseases of nervous system: Secondary | ICD-10-CM

## 2013-09-23 DIAGNOSIS — E119 Type 2 diabetes mellitus without complications: Secondary | ICD-10-CM

## 2013-09-23 LAB — COMPREHENSIVE METABOLIC PANEL (CC13)
ALBUMIN: 3.6 g/dL (ref 3.5–5.0)
ALK PHOS: 117 U/L (ref 40–150)
ALT: 18 U/L (ref 0–55)
AST: 22 U/L (ref 5–34)
Anion Gap: 10 mEq/L (ref 3–11)
BUN: 20 mg/dL (ref 7.0–26.0)
CALCIUM: 10.1 mg/dL (ref 8.4–10.4)
CHLORIDE: 102 meq/L (ref 98–109)
CO2: 26 mEq/L (ref 22–29)
CREATININE: 1.4 mg/dL — AB (ref 0.6–1.1)
Glucose: 203 mg/dl — ABNORMAL HIGH (ref 70–140)
POTASSIUM: 4.1 meq/L (ref 3.5–5.1)
Sodium: 138 mEq/L (ref 136–145)
Total Bilirubin: 0.38 mg/dL (ref 0.20–1.20)
Total Protein: 6.6 g/dL (ref 6.4–8.3)

## 2013-09-23 LAB — CBC WITH DIFFERENTIAL/PLATELET
BASO%: 0.4 % (ref 0.0–2.0)
BASOS ABS: 0 10*3/uL (ref 0.0–0.1)
EOS%: 0.3 % (ref 0.0–7.0)
Eosinophils Absolute: 0 10*3/uL (ref 0.0–0.5)
HCT: 30.5 % — ABNORMAL LOW (ref 34.8–46.6)
HGB: 10.1 g/dL — ABNORMAL LOW (ref 11.6–15.9)
LYMPH#: 0.4 10*3/uL — AB (ref 0.9–3.3)
LYMPH%: 9.4 % — ABNORMAL LOW (ref 14.0–49.7)
MCH: 36 pg — AB (ref 25.1–34.0)
MCHC: 33.3 g/dL (ref 31.5–36.0)
MCV: 108.4 fL — AB (ref 79.5–101.0)
MONO#: 0.8 10*3/uL (ref 0.1–0.9)
MONO%: 17.4 % — AB (ref 0.0–14.0)
NEUT#: 3.3 10*3/uL (ref 1.5–6.5)
NEUT%: 72.5 % (ref 38.4–76.8)
Platelets: 152 10*3/uL (ref 145–400)
RBC: 2.81 10*6/uL — ABNORMAL LOW (ref 3.70–5.45)
RDW: 16 % — AB (ref 11.2–14.5)
WBC: 4.5 10*3/uL (ref 3.9–10.3)

## 2013-09-23 MED ORDER — DEXAMETHASONE SODIUM PHOSPHATE 20 MG/5ML IJ SOLN
INTRAMUSCULAR | Status: AC
  Filled 2013-09-23: qty 5

## 2013-09-23 MED ORDER — DEXAMETHASONE 4 MG PO TABS: 8.0000 mg | ORAL_TABLET | Freq: Two times a day (BID) | ORAL | Status: DC

## 2013-09-23 MED ORDER — SODIUM CHLORIDE 0.9 % IV SOLN
197.4000 mg | Freq: Once | INTRAVENOUS | Status: AC
  Administered 2013-09-23: 200 mg via INTRAVENOUS
  Filled 2013-09-23: qty 20

## 2013-09-23 MED ORDER — SODIUM CHLORIDE 0.9 % IJ SOLN
10.0000 mL | INTRAMUSCULAR | Status: DC | PRN
  Filled 2013-09-23: qty 10

## 2013-09-23 MED ORDER — DEXAMETHASONE SODIUM PHOSPHATE 20 MG/5ML IJ SOLN
20.0000 mg | Freq: Once | INTRAMUSCULAR | Status: AC
  Administered 2013-09-23: 20 mg via INTRAVENOUS

## 2013-09-23 MED ORDER — ONDANSETRON 16 MG/50ML IVPB (CHCC)
INTRAVENOUS | Status: AC
  Filled 2013-09-23: qty 16

## 2013-09-23 MED ORDER — HEPARIN SOD (PORK) LOCK FLUSH 100 UNIT/ML IV SOLN
500.0000 [IU] | Freq: Once | INTRAVENOUS | Status: DC | PRN
  Filled 2013-09-23: qty 5

## 2013-09-23 MED ORDER — CARBOPLATIN CHEMO INTRADERMAL TEST DOSE 100MCG/0.02ML
100.0000 ug | Freq: Once | INTRADERMAL | Status: AC
  Administered 2013-09-23: 100 ug via INTRADERMAL
  Filled 2013-09-23: qty 0.01

## 2013-09-23 MED ORDER — SODIUM CHLORIDE 0.9 % IV SOLN
Freq: Once | INTRAVENOUS | Status: AC
  Administered 2013-09-23: 13:00:00 via INTRAVENOUS

## 2013-09-23 MED ORDER — ONDANSETRON 16 MG/50ML IVPB (CHCC)
16.0000 mg | Freq: Once | INTRAVENOUS | Status: AC
  Administered 2013-09-23: 16 mg via INTRAVENOUS

## 2013-09-23 MED ORDER — SODIUM CHLORIDE 0.9 % IV SOLN
600.0000 mg/m2 | Freq: Once | INTRAVENOUS | Status: AC
  Administered 2013-09-23: 1120 mg via INTRAVENOUS
  Filled 2013-09-23: qty 56

## 2013-09-23 NOTE — Patient Instructions (Signed)
[facility] Discharge Instructions for Patients Receiving Chemotherapy  Today you received the following chemotherapy agents cytoxan/carboplatin.    To help prevent nausea and vomiting after your treatment, we encourage you to take your nausea medication as directed.     If you develop nausea and vomiting that is not controlled by your nausea medication, call the clinic.   BELOW ARE SYMPTOMS THAT SHOULD BE REPORTED IMMEDIATELY:  *FEVER GREATER THAN 100.5 F  *CHILLS WITH OR WITHOUT FEVER  NAUSEA AND VOMITING THAT IS NOT CONTROLLED WITH YOUR NAUSEA MEDICATION  *UNUSUAL SHORTNESS OF BREATH  *UNUSUAL BRUISING OR BLEEDING  TENDERNESS IN MOUTH AND THROAT WITH OR WITHOUT PRESENCE OF ULCERS  *URINARY PROBLEMS  *BOWEL PROBLEMS  UNUSUAL RASH Items with * indicate a potential emergency and should be followed up as soon as possible.  Feel free to call the clinic you have any questions or concerns. The clinic phone number is [phone].

## 2013-09-23 NOTE — Progress Notes (Signed)
ID: Regina West   DOB: [birth year]  MR#: [id number]  RWE#:[id number]  PCP: [name], MD GYN: SU: Stephan Minister, MD OTHER MD: Larey Seat, MD  CHIEF COMPLAINT:  Recurrent ovarian cancer TREATMENT: under active chemotherapy  HISTORY OF PRESENT ILLNESS: From the original intake nodes:  The patient originally presented in the summer of 2010 with cramps and abdominal distension.  I do not have a copy of the initial evaluation, but on November 26, 2008, the patient underwent optimal debulking with bilateral salpingo-oophorectomy, omentectomy, and the placement of an intraperitoneal port.  The pathology from that procedure (Accession Number [id number] at [facility]) showed first of all, significant involvement of the omentum, minimal involvement of the right ovary and right fallopian tube and negative on the left ovary and fallopian tube, with neither ovary being enlarged, consistent with a primary peritoneal serous adenocarcinoma, described as moderately differentiated.  The sample included no lymph nodes.    The patient had an intraperitoneal port placed in the same surgery and was treated with intraperitoneal and IV chemotherapy according to GOG-252 but I am not sure which arm she was in.  (Arm 2 did carboplatin intraperitoneally and Taxol IV.  Arm 3 did cisplatin and paclitaxel intraperitoneally with paclitaxel IV.)  All arms received bevacizumab.  Unfortunately, after 4 cycles of treatment, she had acute mental status changes and was admitted here January of 2011 with what proved to be posterior reversible leukoencephalopathy and SIADH.  She had seizures, aphasia, and required intubation.  Once the patient recovered from this, she was treated with 2 cycles of single-agent carboplatin (I do not have the AUC). Her last adjuvant treatment was May 12, 2009, and her CA-125 at that time was 10.3.  Her intraperitoneal port was removed in April of 2011.    More recently, the patient was in routine  follow up when her CA-125 was found to jump up to 2,269.7 (March 26, 2010).  This is 10 months after her last prior chemotherapy.  She had CTs of the chest, abdomen and pelvis January 26th which showed ascites and enhancing peritoneal nodularity. She had had similar findings at presentation but these had completely resolved by the time she finished treatment in March of 2011. The patient was felt to be in first relapse.   Her subsequent history is as detailed below  INTERVAL HISTORY:  Regina West returns today for followup of her ovarian cancer accompanied by her daughter. Today is day 1 cycle 5 of cyclophosphamide and carboplatin given every 21 days. We are not using Neulasta on day 2 but followed nadirs carefully and after the initial doses, her nadir counts have been excellent  REVIEW OF SYSTEMS:  Regina West doing well with her treatments. Fatigue is her main concern. When we don't treat her, however, namely when she goes on a treatment "holiday" for some months, she is just as fatigue. I think he she exercised regularly, even if minimally, she would get a little bit of extra energy back. Otherwise aside from mild confusion here in there and some shortness of breath when climbing stairs, a detailed review of systems today was entirely stable  PAST MEDICAL HISTORY: Significant for hysterectomy at the age of 41 with concurrent bladder "tuck up."  She also underwent appendectomy during that procedure.  She is status post prior bilateral cataract surgery. She is status post tonsillectomy and adenoidectomy.  She has a history of hypertension and diabetes. She has a history of diverticulosis.  She has a large hiatal hernia.  She  has degenerative disk disease.  She has been noted to have coronary calcifications and she has hyperlipidemia.  There is a history of remote tobacco abuse. Seizure disorder as per HPI  FAMILY HISTORY The patient's father died at the age of 37 after heart surgery for aortic stenosis.  The  patient's mother died at the age of 40.  The patient has one brother in good health.  There is no other family history to her knowledge of ovarian or breast cancer.    GYNECOLOGIC HISTORY: Menarche at age 78.  She is GX P3 with menopause in her late 63s.  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 She is currently residing in [place]. Her husband was in sales and had a history of Parkinson's disease.  He died following a fall in the year 2000.  The patient's son Regina West lives in [place], and has a history of MS.  Daughter Regina West lives in [place]. She is a Agricultural engineer.  Son Regina West lives in [place], [place] and is in Press photographer. The patient has 8 grandchildren. She attends the [employer].  She is a good friend of our patients J. and [place] DIRECTIVES: in place  HEALTH MAINTENANCE:  (Updated December 2014) History  Substance Use Topics  . Smoking status: Former Smoker    Quit date: 05/23/1964  . Smokeless tobacco: Never Used  . Alcohol Use: 0.0 oz/week     Comment: rarely     Colonoscopy: 2008  PAP: UTD  MM:  Declines   Allergies  Allergen Reactions  . Avastin [Bevacizumab] Other (See Comments)    Swelling of the brain     Current Outpatient Prescriptions  Medication Sig Dispense Refill  . acyclovir (ZOVIRAX) 400 MG tablet Take 1 tablet (400 mg total) by mouth 2 (two) times daily.  60 tablet  12  . antiseptic oral rinse (BIOTENE) LIQD 15 mLs by Mouth Rinse route 2 (two) times daily.  1 Bottle  0  . cholecalciferol (VITAMIN D) 1000 UNITS tablet Take 1,000 Units by mouth 2 (two) times daily.      Marland Kitchen dexamethasone (DECADRON) 4 MG tablet Take 2 tablets (8 mg total) by mouth 2 (two) times daily with a meal. Start the day after chemotherapy for 3 days.  30 tablet  1  . levETIRAcetam (KEPPRA) 250 MG tablet Take 1 tablet (250 mg  total) by mouth 2 (two) times daily.  60 tablet  11  . lidocaine-prilocaine (EMLA) cream Apply 1 application topically as needed.  30 g  0  . nitrofurantoin (MACRODANTIN) 50 MG capsule TAKE TWO CAPSULES BY MOUTH ONCE DAILY  60 capsule  3  . omeprazole (PRILOSEC) 40 MG capsule Take 1 capsule (40 mg total) by mouth at bedtime.  30 capsule  2  . ondansetron (ZOFRAN) 8 MG tablet Take 1 tablet (8 mg total) by mouth 2 (two) times daily. Start the day after chemo for 3 days. Then take as needed for nausea or vomiting.  30 tablet  1  . prochlorperazine (COMPAZINE) 10 MG tablet Take 1 tablet (10 mg total) by mouth every 6 (six) hours as needed (Nausea or vomiting).  30 tablet  1  . VIIBRYD 40 MG TABS        No current facility-administered medications for this visit.    OBJECTIVE: Elderly white woman who appears stated  age 80 Vitals:   09/23/13 1146  BP: 141/68  Pulse: 91  Temp: 98.2 F (36.8 C)  Resp: 20     Body mass index is 27.05 kg/(m^2).    ECOG FS: 1 Filed Weights   09/23/13 1146  Weight: 167 lb 8 oz (75.978 kg)   Sclerae unicteric, pupils equal and reactive Oropharynx clear and moist No cervical or supraclavicular adenopathy Lungs no rales or rhonchi Heart regular rate and rhythm Abd soft, nontender, positive bowel sounds, no masses palpated MSK no focal spinal tenderness, no upper extremity lymphedema Neuro: nonfocal, well oriented, positive affect Breasts: Deferred  Lab Results  Component Value Date   WBC 4.5 09/23/2013   NEUTROABS 3.3 09/23/2013   HGB 10.1* 09/23/2013   HCT 30.5* 09/23/2013   MCV 108.4* 09/23/2013   PLT 152 09/23/2013      Chemistry      Component Value Date/Time   NA 141 09/02/2013 1038   NA 136 10/17/2011 1118   NA 143 05/12/2011 1337   K 4.3 09/02/2013 1038   K 3.9 10/17/2011 1118   K 4.1 05/12/2011 1337   CL 100 08/20/2012 1124   CL 100 10/17/2011 1118   CL 92* 05/12/2011 1337   CO2 25 09/02/2013 1038   CO2 27 10/17/2011 1118   CO2 32 05/12/2011 1337    West 32.6* 09/02/2013 1038   West 25* 10/17/2011 1118   West 19 05/12/2011 1337   CREATININE 1.3* 09/02/2013 1038   CREATININE 1.10 10/17/2011 1118   CREATININE 1.3* 05/12/2011 1337      Component Value Date/Time   CALCIUM 9.5 09/02/2013 1038   CALCIUM 9.8 10/17/2011 1118   CALCIUM 9.8 05/12/2011 1337   ALKPHOS 96 09/02/2013 1038   ALKPHOS 109 10/17/2011 1118   ALKPHOS 95* 05/12/2011 1337   AST 17 09/02/2013 1038   AST 22 10/17/2011 1118   AST 40* 05/12/2011 1337   ALT 14 09/02/2013 1038   ALT 14 10/17/2011 1118   ALT 28 05/12/2011 1337   BILITOT 0.38 09/02/2013 1038   BILITOT 0.4 10/17/2011 1118   BILITOT 0.60 05/12/2011 1337     Results for West, Regina C    Ref. Range 05/31/2013 12:10 06/21/2013 12:56 07/08/2013 09:33 08/05/2013 11:26 09/02/2013 10:38  CA 125 Latest Range: 0.0-30.2 U/mL 115.6 (H) 583.6 (H) 184.4 (H) 30.8 (H) 17.1   STUDIES: No results found.  ASSESSMENT: 80 y.o.  [place] woman   (1) status post optimal debulking of a primary peritoneal serous adenocarcinoma September 2010, the tumor being moderately differentiated, pT3c NX (stage IIIC), treated according to GOG 252 with  intraperitoneal platinum and paclitaxel x4 given along with bevacizumab, complicated by SIADH after cycle 4, also with posterior reversible leukoencephalopathy.  After these problems resolved she received 2 additional cycles of single-agent carboplatin completed in March 2011  (2)  She had her first recurrence February 2012  treated with carboplatin and Gemzar for a total of 6 cycles between February and June 2012.  Her CEA-125 had normalized before the beginning of cycle 5.   (3) second recurrence documented February 2013,  treated with carboplatin/ doxil x 6, completed 10/03/2011, with a good response noted on restaging scans  (4) third recurrence documented February 2014, receiving single-agent carboplatin every 3 weeks, first dose 04/30/2012, completed 08/20/2012  (5) fourth recurrence documented by abdominal  symptoms and a rising CA 125 November 2014, receiving Abraxane, initially on days one and 8 of each 21 day cycle, with first dose given on 01/16/2013;  starting 04/22/2013 she received the Abraxane every 2 weeks--rising CA 125 noted 05/31/2013  (6) started cyclophosphamide/ with "low dose is the carboplatin 06/17/2013, to be repeated every 21 days x8 doses  PLAN:  Regina West is having an excellent response to the carboplatin/cyclophosphamide combination. Furthermore her counts are holding up well. We had originally thought of doing 6 doses, but I think it would be prudent to do at least 8. After that we would discuss the issue of continuing therapy further.  Regina West has expressed a desire to "just stop it all" at some point, but that point is not now and she has not set no to continuing treatment after 8 cycles of her current therapy.  She is proceeding to treatment today. I have again encouraged her to participate in the exercise programs available in her retirement home. She knows to call us for any problems that may develop before next visit.    Chauncey Cruel, MD      09/23/2013

## 2013-09-24 LAB — CA 125: CA 125: 12.2 U/mL (ref 0.0–30.2)

## 2013-09-27 ENCOUNTER — Other Ambulatory Visit: Payer: Self-pay | Admitting: *Deleted

## 2013-09-30 ENCOUNTER — Telehealth: Payer: Self-pay | Admitting: Oncology

## 2013-09-30 NOTE — Telephone Encounter (Signed)
added appts for lb/fu/tx 9/3 and added tx to 8/17 lb/fu. lmonvm for pt and mailed schedule. per 7/27 pof 7/24 pof disregarded as it is incorrect. per 7/27 pof appts should be 9/3 not 8/3. tx for 8/17 added per 7/20 pof. message to desk nurse to confirm tx dates in pof's as they differ from care plan and are slightly off from the every 21day regimen.

## 2013-10-01 ENCOUNTER — Telehealth: Payer: Self-pay | Admitting: Oncology

## 2013-10-01 ENCOUNTER — Other Ambulatory Visit: Payer: Self-pay | Admitting: *Deleted

## 2013-10-01 DIAGNOSIS — K219 Gastro-esophageal reflux disease without esophagitis: Secondary | ICD-10-CM

## 2013-10-01 MED ORDER — OMEPRAZOLE 40 MG PO CPDR: 40.0000 mg | DELAYED_RELEASE_CAPSULE | Freq: Every day | ORAL | Status: DC

## 2013-10-01 NOTE — Telephone Encounter (Signed)
Opened in error

## 2013-10-01 NOTE — Telephone Encounter (Signed)
confirmed w/desk nurse today that appts should be q3w apart. 2nd pof sent on 7/24 reads appts expected 9/3 should be the following Monday (9/7) not that appts should be 9/3. s/w pt today re this correction and made her aware that appts are 8/17 and 9/8 due to 9/7 is a holiday. pt aware new schedule beibng mailed to and disregard message and schedule for 9/3. per pt i also called her friend lesa and left the same message on vm as given to the pt today. new schedule mailed. No pof sent 7/27.

## 2013-10-02 ENCOUNTER — Telehealth: Payer: Self-pay | Admitting: Oncology

## 2013-10-02 NOTE — Telephone Encounter (Signed)
Pt daughter cld and wanted to moved pt infus appt to 9/14-adv her we would need a MD or nurse apprvl to do that. Unable to chge w/o that prder-trans pt daughter Lesa to Val for apprvl

## 2013-10-08 ENCOUNTER — Other Ambulatory Visit: Payer: Self-pay | Admitting: *Deleted

## 2013-10-18 ENCOUNTER — Ambulatory Visit (INDEPENDENT_AMBULATORY_CARE_PROVIDER_SITE_OTHER): Payer: Medicare Other | Admitting: Nurse Practitioner

## 2013-10-18 ENCOUNTER — Encounter: Payer: Self-pay | Admitting: Nurse Practitioner

## 2013-10-18 VITALS — BP 139/74 | HR 92 | Ht 65.0 in | Wt 169.2 lb

## 2013-10-18 DIAGNOSIS — G3189 Other specified degenerative diseases of nervous system: Secondary | ICD-10-CM

## 2013-10-18 DIAGNOSIS — D849 Immunodeficiency, unspecified: Secondary | ICD-10-CM

## 2013-10-18 DIAGNOSIS — C569 Malignant neoplasm of unspecified ovary: Secondary | ICD-10-CM

## 2013-10-18 DIAGNOSIS — G63 Polyneuropathy in diseases classified elsewhere: Secondary | ICD-10-CM

## 2013-10-18 DIAGNOSIS — C801 Malignant (primary) neoplasm, unspecified: Secondary | ICD-10-CM

## 2013-10-18 DIAGNOSIS — G130 Paraneoplastic neuromyopathy and neuropathy: Secondary | ICD-10-CM

## 2013-10-18 DIAGNOSIS — G629 Polyneuropathy, unspecified: Secondary | ICD-10-CM

## 2013-10-18 DIAGNOSIS — G119 Hereditary ataxia, unspecified: Secondary | ICD-10-CM

## 2013-10-18 DIAGNOSIS — R413 Other amnesia: Secondary | ICD-10-CM

## 2013-10-18 DIAGNOSIS — G609 Hereditary and idiopathic neuropathy, unspecified: Secondary | ICD-10-CM

## 2013-10-18 NOTE — Progress Notes (Signed)
PATIENT: Regina West DOB: 01-Aug-1929  REASON FOR VISIT:  follow up HISTORY FROM: patient  HISTORY OF PRESENT ILLNESS: Regina West is a 80 y.o. female Is seen here as a revisit from Dr. Reynaldo Minium for follow up after Hyponatremic confusion and convulsion in the setting of ovarian cancer.   Dr. Brett Fairy  encountered Regina West during a hospitalization. He had undergone surgery for ovarian cancer followed by intermittent chemotherapies. One of these caused her to develop hypernatremia, and in return she became confused and developed an epileptic status. Her MRI showed signs of posterior reversible encephalopathy in the patient but had not had a history of hypertension before.   She carries the diagnosis of an adenocarcinoma, metastatic at the time of discovery, and West been followed by Dr. Truman Hayward at [facility] in intervals. She West tolerated Keppra an anti seizure medicine rather well but remained on it because her MRI and never recovered quite to baseline. When I last the patient in 2013 her Mini-Mental status test scored 30 out of 30 points and her clock drawing was full as well as abstract thinking she also generated 15 animal names. She is taking Keppra now at a lowish dose of 250 mg twice a day and feels that it West had less impact on her of feeling depressed or moody. She does voice some memory concerns. She resides at [facility], widowed, enjoyes the community. Keeps her private home. Today she performed a  Montral cognitive assessment test and scored only 14 of 30 points, on the geriatric depression score she endorsed 9 points. She West progressive word finding difficulties.   Update 10/18/13 (LL): Since last visit, Regina West continued to receive chemotherpay treatments she is now on a carboplatin/cyclophosphamide combination, she tells me that she West 2 more monthly treatments in this cycle and then Dr. Jana Hakim will evaluate the response.  She is fighting the fifth recurrence of  the cancer. She does not wish to have any further memory testing, lab testing or MRIs at this point.  She is contnet at this point where she is. She is participating in physical therapy currently and West had only 1 fall in the last several months, trying to get in her bed which was "too high." She West had the bed lowered since.   Review of Systems:  Out of a complete 14 system review, the patient complains of only the following symptoms, and all other reviewed systems are negative.  Memory loss, loss of vision, distended abdomen, frequent infections, urinary urgency.   ALLERGIES: Allergies  Allergen Reactions  . Avastin [Bevacizumab] Other (See Comments)    Swelling of the brain     HOME MEDICATIONS: Outpatient Prescriptions Prior to Visit  Medication Sig Dispense Refill  . acyclovir (ZOVIRAX) 400 MG tablet Take 1 tablet (400 mg total) by mouth 2 (two) times daily.  60 tablet  12  . antiseptic oral rinse (BIOTENE) LIQD 15 mLs by Mouth Rinse route 2 (two) times daily.  1 Bottle  0  . cholecalciferol (VITAMIN D) 1000 UNITS tablet Take 1,000 Units by mouth 2 (two) times daily.      Marland Kitchen dexamethasone (DECADRON) 4 MG tablet Take 2 tablets (8 mg total) by mouth 2 (two) times daily with a meal. Start the day after chemotherapy for 3 days.  30 tablet  1  . levETIRAcetam (KEPPRA) 250 MG tablet Take 1 tablet (250 mg total) by mouth 2 (two) times daily.  60 tablet  11  .  lidocaine-prilocaine (EMLA) cream Apply 1 application topically as needed.  30 g  0  . nitrofurantoin (MACRODANTIN) 50 MG capsule TAKE TWO CAPSULES BY MOUTH ONCE DAILY  60 capsule  3  . omeprazole (PRILOSEC) 40 MG capsule Take 1 capsule (40 mg total) by mouth at bedtime.  30 capsule  2  . ondansetron (ZOFRAN) 8 MG tablet Take 1 tablet (8 mg total) by mouth 2 (two) times daily. Start the day after chemo for 3 days. Then take as needed for nausea or vomiting.  30 tablet  1  . prochlorperazine (COMPAZINE) 10 MG tablet Take 1 tablet (10 mg  total) by mouth every 6 (six) hours as needed (Nausea or vomiting).  30 tablet  1  . VIIBRYD 40 MG TABS        No facility-administered medications prior to visit.    PHYSICAL EXAM Filed Vitals:   10/18/13 1126  BP: 139/74  Pulse: 92  Height: 5\' 5"  (1.651 m)  Weight: 169 lb 3.2 oz (76.749 kg)   Body mass index is 28.16 kg/(m^2).  Physical exam:  General: The patient is awake, alert and appears not in acute distress. The patient is well groomed.  Head: Normocephalic, atraumatic. Neck is supple. Mallampati2, neck circumference:1  Cardiovascular: Regular rate and rhythm, without murmurs or carotid bruit, and without distended neck veins.  Respiratory: Lungs are clear to auscultation.  Skin: Without evidence of edema, or rash  Trunk: BMI is elevated and patient West normal posture.   Neurologic exam :  The patient is awake and alert, oriented to place and time.  There is a normal attention span & concentration ability. Speech is fluent without dysarthria, dysphonia or Aphasia. Mood and affect are appropriate.  Cranial nerves:  Pupils are equal and briskly reactive to light. Funduscopic exam not done. Extraocular movements horizontal gaze nystagmus with gaze to the left and restrictions in gaze to the right. Hearing to finger rub intact. Facial sensation intact to fine touch. Facial motor strength is symmetric and tongue and uvula move midline.  Motor exam: Normal tone and normal muscle bulk and symmetric normal strength in all extremities.  Sensory: Fine touchis intact in all extremities. Coordination: Rapid alternating movements in the fingers/hands with past pointing and satelliting, cerebellar ataxia. Finger-to-nose maneuver: evidence of ataxia, dysmetria. Gait and stance mildly ataxic.  Deep tendon reflexes: in the lower extremities are absent, upper extremities 1+  ASSESSMENT: 80 y.o. 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 Loss of memory (MOCA last visit was14 out of 30, she refuses memory testing today). No recurrent seizures.   Plan:  Continue Keppra at current dose.  Your blood work and sodium levels have been stable over several months. We will see you on an as needed basis in the future, as requested. Natrium levels have been checked monthly at cancer center.  Rudi Rummage Yoniel Arkwright, MSN, FNP-BC, A/GNP-C 10/18/2013, 11:32 AM [facility] [address], [place], [place] [address] [phone]  Note: This document was prepared with digital dictation and possible smart phrase technology. Any transcriptional errors that result from this process are unintentional.

## 2013-10-18 NOTE — Patient Instructions (Signed)
Continue Keppra at current dose.  Your blood work and sodium levels have been stable over several months. We will see you on an as needed basis in the future. Let us know if there is any thing we can do for you!

## 2013-10-21 ENCOUNTER — Other Ambulatory Visit (HOSPITAL_BASED_OUTPATIENT_CLINIC_OR_DEPARTMENT_OTHER): Payer: Medicare Other

## 2013-10-21 ENCOUNTER — Ambulatory Visit (HOSPITAL_BASED_OUTPATIENT_CLINIC_OR_DEPARTMENT_OTHER): Payer: Medicare Other | Admitting: Nurse Practitioner

## 2013-10-21 ENCOUNTER — Other Ambulatory Visit: Payer: Medicare Other

## 2013-10-21 ENCOUNTER — Ambulatory Visit (HOSPITAL_COMMUNITY)
Admission: RE | Admit: 2013-10-21 | Discharge: 2013-10-21 | Disposition: A | Discharge status: Home / Self Care | Payer: Medicare Other | Source: Ambulatory Visit | Attending: Oncology | Admitting: Oncology

## 2013-10-21 ENCOUNTER — Ambulatory Visit: Payer: Medicare Other

## 2013-10-21 ENCOUNTER — Telehealth: Payer: Self-pay | Admitting: Nurse Practitioner

## 2013-10-21 ENCOUNTER — Encounter: Payer: Self-pay | Admitting: Nurse Practitioner

## 2013-10-21 ENCOUNTER — Telehealth: Payer: Self-pay | Admitting: *Deleted

## 2013-10-21 VITALS — BP 123/59 | HR 77 | Temp 98.0°F | Resp 20 | Ht 65.0 in | Wt 169.2 lb

## 2013-10-21 DIAGNOSIS — D649 Anemia, unspecified: Secondary | ICD-10-CM

## 2013-10-21 DIAGNOSIS — C569 Malignant neoplasm of unspecified ovary: Secondary | ICD-10-CM | POA: Diagnosis not present

## 2013-10-21 DIAGNOSIS — G609 Hereditary and idiopathic neuropathy, unspecified: Secondary | ICD-10-CM

## 2013-10-21 DIAGNOSIS — F29 Unspecified psychosis not due to a substance or known physiological condition: Secondary | ICD-10-CM

## 2013-10-21 DIAGNOSIS — C482 Malignant neoplasm of peritoneum, unspecified: Secondary | ICD-10-CM

## 2013-10-21 LAB — COMPREHENSIVE METABOLIC PANEL (CC13)
ALBUMIN: 3.5 g/dL (ref 3.5–5.0)
ALT: 10 U/L (ref 0–55)
ANION GAP: 9 meq/L (ref 3–11)
AST: 20 U/L (ref 5–34)
Alkaline Phosphatase: 93 U/L (ref 40–150)
BILIRUBIN TOTAL: 0.26 mg/dL (ref 0.20–1.20)
BUN: 21.7 mg/dL (ref 7.0–26.0)
CALCIUM: 9.6 mg/dL (ref 8.4–10.4)
CHLORIDE: 100 meq/L (ref 98–109)
CO2: 25 meq/L (ref 22–29)
Creatinine: 1.3 mg/dL — ABNORMAL HIGH (ref 0.6–1.1)
GLUCOSE: 111 mg/dL (ref 70–140)
POTASSIUM: 4.3 meq/L (ref 3.5–5.1)
SODIUM: 134 meq/L — AB (ref 136–145)
TOTAL PROTEIN: 6.2 g/dL — AB (ref 6.4–8.3)

## 2013-10-21 LAB — CBC WITH DIFFERENTIAL/PLATELET
BASO%: 0.3 % (ref 0.0–2.0)
Basophils Absolute: 0 10*3/uL (ref 0.0–0.1)
EOS ABS: 0 10*3/uL (ref 0.0–0.5)
EOS%: 0.2 % (ref 0.0–7.0)
HCT: 25.8 % — ABNORMAL LOW (ref 34.8–46.6)
HGB: 8.6 g/dL — ABNORMAL LOW (ref 11.6–15.9)
LYMPH#: 0.3 10*3/uL — AB (ref 0.9–3.3)
LYMPH%: 7.6 % — ABNORMAL LOW (ref 14.0–49.7)
MCH: 36.6 pg — ABNORMAL HIGH (ref 25.1–34.0)
MCHC: 33.2 g/dL (ref 31.5–36.0)
MCV: 110.2 fL — ABNORMAL HIGH (ref 79.5–101.0)
MONO#: 0.8 10*3/uL (ref 0.1–0.9)
MONO%: 18.7 % — ABNORMAL HIGH (ref 0.0–14.0)
NEUT%: 73.2 % (ref 38.4–76.8)
NEUTROS ABS: 3.3 10*3/uL (ref 1.5–6.5)
PLATELETS: 240 10*3/uL (ref 145–400)
RBC: 2.34 10*6/uL — AB (ref 3.70–5.45)
RDW: 15.8 % — ABNORMAL HIGH (ref 11.2–14.5)
WBC: 4.5 10*3/uL (ref 3.9–10.3)

## 2013-10-21 LAB — PREPARE RBC (CROSSMATCH)

## 2013-10-21 NOTE — Telephone Encounter (Signed)
Per staff phone call and POF I have schedueld appts. Scheduler advised of appts.  JMW  

## 2013-10-21 NOTE — Telephone Encounter (Signed)
, °

## 2013-10-21 NOTE — Progress Notes (Addendum)
ID: Regina West   DOB: [birth year]  MR#: [id number]  CSN#:[id number]  PCP: [name], MD GYN: SU: Stephan Minister, MD OTHER MD: Larey Seat, MD  CHIEF COMPLAINT:  Recurrent ovarian cancer TREATMENT: under active chemotherapy  HISTORY OF PRESENT ILLNESS: From the original intake nodes:  The patient originally presented in the summer of 2010 with cramps and abdominal distension.  I do not have a copy of the initial evaluation, but on November 26, 2008, the patient underwent optimal debulking with bilateral salpingo-oophorectomy, omentectomy, and the placement of an intraperitoneal port.  The pathology from that procedure (Accession Number [id number] at [facility]) showed first of all, significant involvement of the omentum, minimal involvement of the right ovary and right fallopian tube and negative on the left ovary and fallopian tube, with neither ovary being enlarged, consistent with a primary peritoneal serous adenocarcinoma, described as moderately differentiated.  The sample included no lymph nodes.    The patient had an intraperitoneal port placed in the same surgery and was treated with intraperitoneal and IV chemotherapy according to GOG-252 but I am not sure which arm she was in.  (Arm 2 did carboplatin intraperitoneally and Taxol IV.  Arm 3 did cisplatin and paclitaxel intraperitoneally with paclitaxel IV.)  All arms received bevacizumab.  Unfortunately, after 4 cycles of treatment, she had acute mental status changes and was admitted here January of 2011 with what proved to be posterior reversible leukoencephalopathy and SIADH.  She had seizures, aphasia, and required intubation.  Once the patient recovered from this, she was treated with 2 cycles of single-agent carboplatin (I do not have the AUC). Her last adjuvant treatment was May 12, 2009, and her CA-125 at that time was 10.3.  Her intraperitoneal port was removed in April of 2011.    More recently, the patient was in routine  follow up when her CA-125 was found to jump up to 2,269.7 (March 26, 2010).  This is 10 months after her last prior chemotherapy.  She had CTs of the chest, abdomen and pelvis January 26th which showed ascites and enhancing peritoneal nodularity. She had had similar findings at presentation but these had completely resolved by the time she finished treatment in March of 2011. The patient was felt to be in first relapse.   Her subsequent history is as detailed below  INTERVAL HISTORY:  Regina West returns today for followup of her ovarian cancer accompanied by her daughter-in-law [name] and her friend Lattie Haw. Today is day 1, cycle 6 of cyclophosphamide and carboplatin infused every 21 days. There is no neulasta scheduled on day 2 but her nadirs have been followed carefully after the initial doses and were excellent. Kayelee is in a wheelchair and complains of excessive fatigue for the past 3 weeks. She has recently returned from a  [place] trip. Her daughter in law says she has gotten off balance and fallen several times out her chair after not looking where she going to sit. She has succumbed to riding in a wheelchair for the past few weeks instead of risking a fall while walking. Luckily her daughter in law has several extra wheelchairs as her husband is diagnosed with multiple sclerosis. Ertha states she is occasionally dizzy, and needs a moment after standing to adjust.    REVIEW OF SYSTEMS:  Besides the fatigue, Regina West complains of confusion on occasion, or a lapse in her memory. The tips of her fingers have peripheral neuropathy, but this is not new and has not gotten worse. She denies fevers,  but has been chilly. A detailed review of systems was otherwise noncontributory.   PAST MEDICAL HISTORY: Significant for hysterectomy at the age of 26 with concurrent bladder "tuck up."  She also underwent appendectomy during that procedure.  She is status post prior bilateral cataract surgery. She is status post  tonsillectomy and adenoidectomy.  She has a history of hypertension and diabetes. She has a history of diverticulosis.  She has a large hiatal hernia.  She has degenerative disk disease.  She has been noted to have coronary calcifications and she has hyperlipidemia.  There is a history of remote tobacco abuse. Seizure disorder as per HPI  FAMILY HISTORY The patient's father died at the age of 1 after heart surgery for aortic stenosis.  The patient's mother died at the age of 43.  The patient has one brother in good health.  There is no other family history to her knowledge of ovarian or breast cancer.    GYNECOLOGIC HISTORY: Menarche at age 13.  She is GX P3 with menopause in her late 81s.  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 She is currently residing in [place]. Her husband was in sales and had a history of Parkinson's disease.  He died following a fall in the year 2000.  The patient's son Regina West lives in [place], and has a history of MS.  Daughter Regina West lives in [place]. She is a Agricultural engineer.  Son Regina West lives in [place], [place] and is in Press photographer. The patient has 8 grandchildren. She attends the [employer].  She is a good friend of our patients J. and [place] DIRECTIVES: in place  HEALTH MAINTENANCE:  (Updated December 2014) History  Substance Use Topics  . Smoking status: Former Smoker    Quit date: 05/23/1964  . Smokeless tobacco: Never Used  . Alcohol Use: 0.0 oz/week     Comment: rarely     Colonoscopy: 2008  PAP: UTD  MM:  Declines   Allergies  Allergen Reactions  . Avastin [Bevacizumab] Other (See Comments)    Swelling of the brain     Current Outpatient Prescriptions  Medication Sig Dispense Refill  . acyclovir (ZOVIRAX) 400 MG tablet Take 1 tablet (400 mg total) by mouth 2 (two) times daily.  60  tablet  12  . antiseptic oral rinse (BIOTENE) LIQD 15 mLs by Mouth Rinse route 2 (two) times daily.  1 Bottle  0  . cholecalciferol (VITAMIN D) 1000 UNITS tablet Take 1,000 Units by mouth 2 (two) times daily.      Marland Kitchen dexamethasone (DECADRON) 4 MG tablet Take 2 tablets (8 mg total) by mouth 2 (two) times daily with a meal. Start the day after chemotherapy for 3 days.  30 tablet  1  . levETIRAcetam (KEPPRA) 250 MG tablet Take 1 tablet (250 mg total) by mouth 2 (two) times daily.  60 tablet  11  . lidocaine-prilocaine (EMLA) cream Apply 1 application topically as needed.  30 g  0  . nitrofurantoin (MACRODANTIN) 50 MG capsule TAKE TWO CAPSULES BY MOUTH ONCE DAILY  60 capsule  3  . omeprazole (PRILOSEC) 40 MG capsule Take 1 capsule (40 mg total) by mouth at bedtime.  30 capsule  2  . VIIBRYD 40 MG TABS       . ondansetron (ZOFRAN) 8 MG tablet Take 1 tablet (  8 mg total) by mouth 2 (two) times daily. Start the day after chemo for 3 days. Then take as needed for nausea or vomiting.  30 tablet  1  . prochlorperazine (COMPAZINE) 10 MG tablet Take 1 tablet (10 mg total) by mouth every 6 (six) hours as needed (Nausea or vomiting).  30 tablet  1   No current facility-administered medications for this visit.    OBJECTIVE: Elderly white woman who appears stated age 36 Vitals:   10/21/13 1323  BP: 123/59  Pulse: 77  Temp: 98 F (36.7 C)  Resp: 20     Body mass index is 28.16 kg/(m^2).    ECOG FS: 2 Filed Weights   10/21/13 1323  Weight: 169 lb 3.2 oz (76.749 kg)   Skin: warm, dry  HEENT: sclerae anicteric, conjunctivae pink, oropharynx clear. No thrush or mucositis.  Lymph Nodes: No cervical or supraclavicular lymphadenopathy  Lungs: clear to auscultation bilaterally, no rales, wheezes, or rhonci  Heart: regular rate and rhythm  Abdomen: round, soft, non tender, positive bowel sounds  Musculoskeletal: No focal spinal tenderness, no peripheral edema  Neuro: non focal, well oriented, positive  affect  Breasts: deferred  Lab Results  Component Value Date   WBC 4.5 10/21/2013   NEUTROABS 3.3 10/21/2013   HGB 8.6* 10/21/2013   HCT 25.8* 10/21/2013   MCV 110.2* 10/21/2013   PLT 240 10/21/2013       Chemistry      Component Value Date/Time   NA 134* 10/21/2013 1354   NA 136 10/17/2011 1118   NA 143 05/12/2011 1337   K 4.3 10/21/2013 1354   K 3.9 10/17/2011 1118   K 4.1 05/12/2011 1337   CL 100 08/20/2012 1124   CL 100 10/17/2011 1118   CL 92* 05/12/2011 1337   CO2 25 10/21/2013 1354   CO2 27 10/17/2011 1118   CO2 32 05/12/2011 1337   West 21.7 10/21/2013 1354   West 25* 10/17/2011 1118   West 19 05/12/2011 1337   CREATININE 1.3* 10/21/2013 1354   CREATININE 1.10 10/17/2011 1118   CREATININE 1.3* 05/12/2011 1337      Component Value Date/Time   CALCIUM 9.6 10/21/2013 1354   CALCIUM 9.8 10/17/2011 1118   CALCIUM 9.8 05/12/2011 1337   ALKPHOS 93 10/21/2013 1354   ALKPHOS 109 10/17/2011 1118   ALKPHOS 95* 05/12/2011 1337   AST 20 10/21/2013 1354   AST 22 10/17/2011 1118   AST 40* 05/12/2011 1337   ALT 10 10/21/2013 1354   ALT 14 10/17/2011 1118   ALT 28 05/12/2011 1337   BILITOT 0.26 10/21/2013 1354   BILITOT 0.4 10/17/2011 1118   BILITOT 0.60 05/12/2011 1337     Results for Liaw, Shameika C    Ref. Range 05/31/2013 12:10 06/21/2013 12:56 07/08/2013 09:33 08/05/2013 11:26 09/02/2013 10:38  CA 125 Latest Range: 0.0-30.2 U/mL 115.6 (H) 583.6 (H) 184.4 (H) 30.8 (H) 17.1   STUDIES: No results found.  ASSESSMENT: 80 y.o.  [place] woman   (1) status post optimal debulking of a primary peritoneal serous adenocarcinoma September 2010, the tumor being moderately differentiated, pT3c NX (stage IIIC), treated according to GOG 252 with  intraperitoneal platinum and paclitaxel x4 given along with bevacizumab, complicated by SIADH after cycle 4, also with posterior reversible leukoencephalopathy.  After these problems resolved she received 2 additional cycles of single-agent carboplatin completed in March 2011  (2)   She had her first recurrence February 2012  treated with carboplatin and Gemzar for a  total of 6 cycles between February and June 2012.  Her CEA-125 had normalized before the beginning of cycle 5.   (3) second recurrence documented February 2013,  treated with carboplatin/ doxil x 6, completed 10/03/2011, with a good response noted on restaging scans  (4) third recurrence documented February 2014, receiving single-agent carboplatin every 3 weeks, first dose 04/30/2012, completed 08/20/2012  (5) fourth recurrence documented by abdominal symptoms and a rising CA 125 November 2014, receiving Abraxane, initially on days one and 8 of each 21 day cycle, with first dose given on 01/16/2013; starting 04/22/2013 she received the Abraxane every 2 weeks--rising CA 125 noted 05/31/2013  (6) started cyclophosphamide/ with "low dose" carboplatin 06/17/2013, to be repeated every 21 days x8 doses  PLAN:  Nashanti is very concerned about her fatigue. The labs were reviewed in detail with the patient, and it was revealed that she is anemic with a hemoglobin of 8.6 and is a candidate for a blood transfusion as she is symptomatic. The patient preferred to delay chemo today and to have her blood transfusion as soon as possible. Tomorrow is the earliest availability, so her chemo appt will be moved to Friday. In addition to the blood, we will start her on 224mcg aranesp every 2 weeks starting next week to improve her counts.  She will take an extra week between this cycle and the next, and will return for cycle 7 on Sept 14th. The plan is to do at least 8 cycles. Resha understands and agrees with this plan. She has been encouraged to call with any issues that may arise before her next visit here.   Marcelino Duster, NP     10/21/2013    ADDENDUM: Elika will complete 6 cycles of chemotherapy this week. She has had a The response. She is running out of steam however. It may be that we should start broadening the  treatment interval, to 4 weeks perhaps, and possibly we should consider adding darbepoetin. We will discuss these options at her next visit here which will be mid-September. Otherwise she knows to call for any problems that may develop.  I personally saw this patient and performed a substantive portion of this encounter with the listed APP documented above.   Chauncey Cruel, MD

## 2013-10-22 ENCOUNTER — Other Ambulatory Visit: Payer: Self-pay | Admitting: *Deleted

## 2013-10-22 ENCOUNTER — Ambulatory Visit (HOSPITAL_BASED_OUTPATIENT_CLINIC_OR_DEPARTMENT_OTHER): Payer: Medicare Other

## 2013-10-22 VITALS — BP 147/78 | HR 73 | Temp 97.9°F | Resp 19

## 2013-10-22 DIAGNOSIS — D649 Anemia, unspecified: Secondary | ICD-10-CM | POA: Diagnosis not present

## 2013-10-22 LAB — CA 125: CA 125: 12.8 U/mL (ref 0.0–30.2)

## 2013-10-22 LAB — HOLD TUBE, BLOOD BANK

## 2013-10-22 MED ORDER — SODIUM CHLORIDE 0.9 % IV SOLN
250.0000 mL | Freq: Once | INTRAVENOUS | Status: AC
  Administered 2013-10-22: 250 mL via INTRAVENOUS

## 2013-10-22 MED ORDER — HEPARIN SOD (PORK) LOCK FLUSH 100 UNIT/ML IV SOLN
500.0000 [IU] | Freq: Every day | INTRAVENOUS | Status: AC | PRN
  Administered 2013-10-22: 500 [IU]
  Filled 2013-10-22: qty 5

## 2013-10-22 MED ORDER — SODIUM CHLORIDE 0.9 % IJ SOLN
10.0000 mL | INTRAMUSCULAR | Status: AC | PRN
  Administered 2013-10-22: 10 mL
  Filled 2013-10-22: qty 10

## 2013-10-22 NOTE — Patient Instructions (Signed)

## 2013-10-23 LAB — TYPE AND SCREEN
ABO/RH(D): A POS
Antibody Screen: NEGATIVE
Unit division: 0
Unit division: 0

## 2013-10-23 NOTE — Addendum Note (Signed)
Addended by: FERRELL, Kalese Ensz L on: 10/23/2013 04:30 PM   Modules accepted: Level of Service  

## 2013-10-24 ENCOUNTER — Other Ambulatory Visit: Payer: Self-pay | Admitting: *Deleted

## 2013-10-24 DIAGNOSIS — C482 Malignant neoplasm of peritoneum, unspecified: Secondary | ICD-10-CM

## 2013-10-25 ENCOUNTER — Ambulatory Visit (HOSPITAL_BASED_OUTPATIENT_CLINIC_OR_DEPARTMENT_OTHER): Payer: Medicare Other

## 2013-10-25 ENCOUNTER — Other Ambulatory Visit: Payer: Self-pay | Admitting: Oncology

## 2013-10-25 VITALS — BP 151/75 | HR 66 | Temp 98.4°F | Resp 18

## 2013-10-25 DIAGNOSIS — C482 Malignant neoplasm of peritoneum, unspecified: Secondary | ICD-10-CM

## 2013-10-25 DIAGNOSIS — C50419 Malignant neoplasm of upper-outer quadrant of unspecified female breast: Secondary | ICD-10-CM

## 2013-10-25 DIAGNOSIS — Z5111 Encounter for antineoplastic chemotherapy: Secondary | ICD-10-CM

## 2013-10-25 LAB — CBC WITH DIFFERENTIAL/PLATELET
BASO%: 0.4 % (ref 0.0–2.0)
BASOS ABS: 0 10*3/uL (ref 0.0–0.1)
EOS%: 0.7 % (ref 0.0–7.0)
Eosinophils Absolute: 0 10*3/uL (ref 0.0–0.5)
HEMATOCRIT: 34.8 % (ref 34.8–46.6)
HEMOGLOBIN: 11.4 g/dL — AB (ref 11.6–15.9)
LYMPH%: 9 % — ABNORMAL LOW (ref 14.0–49.7)
MCH: 32.9 pg (ref 25.1–34.0)
MCHC: 32.7 g/dL (ref 31.5–36.0)
MCV: 100.4 fL (ref 79.5–101.0)
MONO#: 1 10*3/uL — AB (ref 0.1–0.9)
MONO%: 21.8 % — ABNORMAL HIGH (ref 0.0–14.0)
NEUT#: 3.1 10*3/uL (ref 1.5–6.5)
NEUT%: 68.1 % (ref 38.4–76.8)
Platelets: 182 10*3/uL (ref 145–400)
RBC: 3.46 10*6/uL — ABNORMAL LOW (ref 3.70–5.45)
RDW: 23.7 % — ABNORMAL HIGH (ref 11.2–14.5)
WBC: 4.6 10*3/uL (ref 3.9–10.3)
lymph#: 0.4 10*3/uL — ABNORMAL LOW (ref 0.9–3.3)

## 2013-10-25 MED ORDER — SODIUM CHLORIDE 0.9 % IV SOLN
Freq: Once | INTRAVENOUS | Status: AC
  Administered 2013-10-25: 15:00:00 via INTRAVENOUS

## 2013-10-25 MED ORDER — CARBOPLATIN CHEMO INTRADERMAL TEST DOSE 100MCG/0.02ML
100.0000 ug | Freq: Once | INTRADERMAL | Status: AC
  Administered 2013-10-25: 100 ug via INTRADERMAL
  Filled 2013-10-25: qty 0.01

## 2013-10-25 MED ORDER — HEPARIN SOD (PORK) LOCK FLUSH 100 UNIT/ML IV SOLN
500.0000 [IU] | Freq: Once | INTRAVENOUS | Status: AC | PRN
  Administered 2013-10-25: 500 [IU]
  Filled 2013-10-25: qty 5

## 2013-10-25 MED ORDER — DEXAMETHASONE SODIUM PHOSPHATE 20 MG/5ML IJ SOLN
20.0000 mg | Freq: Once | INTRAMUSCULAR | Status: AC
  Administered 2013-10-25: 20 mg via INTRAVENOUS

## 2013-10-25 MED ORDER — ONDANSETRON 16 MG/50ML IVPB (CHCC)
16.0000 mg | Freq: Once | INTRAVENOUS | Status: AC
  Administered 2013-10-25: 16 mg via INTRAVENOUS

## 2013-10-25 MED ORDER — ONDANSETRON 16 MG/50ML IVPB (CHCC)
INTRAVENOUS | Status: AC
  Filled 2013-10-25: qty 16

## 2013-10-25 MED ORDER — SODIUM CHLORIDE 0.9 % IV SOLN
197.4000 mg | Freq: Once | INTRAVENOUS | Status: AC
  Administered 2013-10-25: 200 mg via INTRAVENOUS
  Filled 2013-10-25: qty 20

## 2013-10-25 MED ORDER — SODIUM CHLORIDE 0.9 % IJ SOLN
10.0000 mL | INTRAMUSCULAR | Status: DC | PRN
  Administered 2013-10-25: 10 mL
  Filled 2013-10-25: qty 10

## 2013-10-25 MED ORDER — SODIUM CHLORIDE 0.9 % IV SOLN
600.0000 mg/m2 | Freq: Once | INTRAVENOUS | Status: AC
  Administered 2013-10-25: 1120 mg via INTRAVENOUS
  Filled 2013-10-25: qty 56

## 2013-10-25 MED ORDER — DEXAMETHASONE SODIUM PHOSPHATE 20 MG/5ML IJ SOLN
INTRAMUSCULAR | Status: AC
  Filled 2013-10-25: qty 5

## 2013-10-25 NOTE — Patient Instructions (Signed)
[facility] Discharge Instructions for Patients Receiving Chemotherapy  Today you received the following chemotherapy agents Cytoxan/Carboplatin.   To help prevent nausea and vomiting after your treatment, we encourage you to take your nausea medication as directed.    If you develop nausea and vomiting that is not controlled by your nausea medication, call the clinic.   BELOW ARE SYMPTOMS THAT SHOULD BE REPORTED IMMEDIATELY:  *FEVER GREATER THAN 100.5 F  *CHILLS WITH OR WITHOUT FEVER  NAUSEA AND VOMITING THAT IS NOT CONTROLLED WITH YOUR NAUSEA MEDICATION  *UNUSUAL SHORTNESS OF BREATH  *UNUSUAL BRUISING OR BLEEDING  TENDERNESS IN MOUTH AND THROAT WITH OR WITHOUT PRESENCE OF ULCERS  *URINARY PROBLEMS  *BOWEL PROBLEMS  UNUSUAL RASH Items with * indicate a potential emergency and should be followed up as soon as possible.  Feel free to call the clinic you have any questions or concerns. The clinic phone number is [phone].

## 2013-10-26 NOTE — Addendum Note (Signed)
Addended by: Chauncey Cruel on: 10/26/2013 04:15 PM   Modules accepted: Level of Service

## 2013-10-28 ENCOUNTER — Ambulatory Visit: Payer: Medicare Other

## 2013-10-28 ENCOUNTER — Ambulatory Visit (HOSPITAL_BASED_OUTPATIENT_CLINIC_OR_DEPARTMENT_OTHER): Payer: Medicare Other

## 2013-10-28 DIAGNOSIS — C482 Malignant neoplasm of peritoneum, unspecified: Secondary | ICD-10-CM

## 2013-10-28 DIAGNOSIS — D638 Anemia in other chronic diseases classified elsewhere: Secondary | ICD-10-CM

## 2013-10-28 DIAGNOSIS — C569 Malignant neoplasm of unspecified ovary: Secondary | ICD-10-CM

## 2013-10-28 DIAGNOSIS — D649 Anemia, unspecified: Secondary | ICD-10-CM

## 2013-10-28 LAB — CBC WITH DIFFERENTIAL/PLATELET
BASO%: 0.1 % (ref 0.0–2.0)
BASOS ABS: 0 10*3/uL (ref 0.0–0.1)
EOS%: 0 % (ref 0.0–7.0)
Eosinophils Absolute: 0 10*3/uL (ref 0.0–0.5)
HCT: 34.8 % (ref 34.8–46.6)
HGB: 11.6 g/dL (ref 11.6–15.9)
LYMPH%: 2.4 % — ABNORMAL LOW (ref 14.0–49.7)
MCH: 33.5 pg (ref 25.1–34.0)
MCHC: 33.3 g/dL (ref 31.5–36.0)
MCV: 100.7 fL (ref 79.5–101.0)
MONO#: 0.4 10*3/uL (ref 0.1–0.9)
MONO%: 5.9 % (ref 0.0–14.0)
NEUT%: 91.6 % — AB (ref 38.4–76.8)
NEUTROS ABS: 5.4 10*3/uL (ref 1.5–6.5)
PLATELETS: 172 10*3/uL (ref 145–400)
RBC: 3.46 10*6/uL — ABNORMAL LOW (ref 3.70–5.45)
RDW: 23.2 % — ABNORMAL HIGH (ref 11.2–14.5)
WBC: 5.9 10*3/uL (ref 3.9–10.3)
lymph#: 0.1 10*3/uL — ABNORMAL LOW (ref 0.9–3.3)

## 2013-10-28 MED ORDER — DARBEPOETIN ALFA-POLYSORBATE 200 MCG/0.4ML IJ SOLN: 200.0000 ug | Freq: Once | INTRAMUSCULAR | Status: DC

## 2013-10-30 LAB — CA 125: CA 125: 17 U/mL (ref <35)

## 2013-10-30 LAB — CA 125(PREVIOUS METHOD): CA 125: 14.2 U/mL (ref 0.0–30.2)

## 2013-11-01 ENCOUNTER — Other Ambulatory Visit: Payer: Self-pay | Admitting: *Deleted

## 2013-11-01 DIAGNOSIS — C482 Malignant neoplasm of peritoneum, unspecified: Secondary | ICD-10-CM

## 2013-11-01 MED ORDER — NITROFURANTOIN MACROCRYSTAL 50 MG PO CAPS: ORAL_CAPSULE | ORAL | Status: DC

## 2013-11-04 ENCOUNTER — Other Ambulatory Visit: Payer: Self-pay | Admitting: *Deleted

## 2013-11-04 ENCOUNTER — Other Ambulatory Visit (HOSPITAL_BASED_OUTPATIENT_CLINIC_OR_DEPARTMENT_OTHER): Payer: Medicare Other

## 2013-11-04 ENCOUNTER — Telehealth: Payer: Self-pay | Admitting: Oncology

## 2013-11-04 DIAGNOSIS — T451X5A Adverse effect of antineoplastic and immunosuppressive drugs, initial encounter: Secondary | ICD-10-CM

## 2013-11-04 DIAGNOSIS — C482 Malignant neoplasm of peritoneum, unspecified: Secondary | ICD-10-CM

## 2013-11-04 DIAGNOSIS — D6481 Anemia due to antineoplastic chemotherapy: Secondary | ICD-10-CM

## 2013-11-04 LAB — CBC WITH DIFFERENTIAL/PLATELET
BASO%: 2.4 % — AB (ref 0.0–2.0)
Basophils Absolute: 0 10*3/uL (ref 0.0–0.1)
EOS%: 2.4 % (ref 0.0–7.0)
Eosinophils Absolute: 0 10*3/uL (ref 0.0–0.5)
HEMATOCRIT: 33 % — AB (ref 34.8–46.6)
HGB: 11.3 g/dL — ABNORMAL LOW (ref 11.6–15.9)
LYMPH%: 26.2 % (ref 14.0–49.7)
MCH: 33.1 pg (ref 25.1–34.0)
MCHC: 34.2 g/dL (ref 31.5–36.0)
MCV: 96.8 fL (ref 79.5–101.0)
MONO#: 0.2 10*3/uL (ref 0.1–0.9)
MONO%: 19 % — ABNORMAL HIGH (ref 0.0–14.0)
NEUT#: 0.4 10*3/uL — CL (ref 1.5–6.5)
NEUT%: 50 % (ref 38.4–76.8)
PLATELETS: 61 10*3/uL — AB (ref 145–400)
RBC: 3.41 10*6/uL — ABNORMAL LOW (ref 3.70–5.45)
RDW: 17.9 % — ABNORMAL HIGH (ref 11.2–14.5)
WBC: 0.8 10*3/uL — CL (ref 3.9–10.3)
lymph#: 0.2 10*3/uL — ABNORMAL LOW (ref 0.9–3.3)
nRBC: 0 % (ref 0–0)

## 2013-11-04 NOTE — Telephone Encounter (Signed)
per pof to sch pt lab appt-pt here-

## 2013-11-07 ENCOUNTER — Other Ambulatory Visit: Payer: Medicare Other

## 2013-11-07 ENCOUNTER — Ambulatory Visit: Payer: Medicare Other | Admitting: Nurse Practitioner

## 2013-11-07 ENCOUNTER — Ambulatory Visit: Payer: Medicare Other

## 2013-11-08 ENCOUNTER — Encounter: Payer: Self-pay | Admitting: Podiatry

## 2013-11-08 ENCOUNTER — Ambulatory Visit (INDEPENDENT_AMBULATORY_CARE_PROVIDER_SITE_OTHER): Payer: Medicare Other | Admitting: Podiatry

## 2013-11-08 VITALS — BP 128/76 | HR 89 | Resp 18

## 2013-11-08 DIAGNOSIS — B351 Tinea unguium: Secondary | ICD-10-CM

## 2013-11-08 DIAGNOSIS — M79676 Pain in unspecified toe(s): Secondary | ICD-10-CM

## 2013-11-08 DIAGNOSIS — M79609 Pain in unspecified limb: Secondary | ICD-10-CM

## 2013-11-08 NOTE — Progress Notes (Signed)
   Subjective:    Patient ID: Regina West, female    DOB: [birth year], 80 y.o.   MRN: [id number]  HPI  80 year old female presents the office today for concerns over possible toenail fungus. She states that she's noticed and discoloration in her toes which has been ongoing for the last several years. She states that she is concerned about the nail fungus possibly spreading to the other nails. Nails symptomatic with shoe gear. No other complaints at this time.     Review of Systems  Skin:       Nail fungus       Objective:   Physical Exam AAO x3, NAD DP/PT pulses palpable b/l. CRT < 3sec Protective sensation intact with Derrel Nip monofilament, vibratory sensation intact, as tendon reflex intact Bilateral hallux nails hypertrophic, dystrophic, elongated, brittle with yellow discoloration. Remaining nails have yellow discoloration, elongated, dystrophic. On the right nail of the hallux there is a small amount of what appears to be a subungual hematoma. Surrounding erythema or drainage from around the nails. No open skin lesions, pre-ulcerative lesions. MMT 5/5, ROM WNL No leg pain/warmth/edema      Assessment & Plan:  80 year old female with likely onychomycosis. -Discussed various treatment options including alternatives, risks, complications. -At this time the patient elects to proceed with over-the-counter therapy due to her medical conditions and medications. Recommended fungi-nail.  -Discussed daily habits with the patient which may help avoid spreading a fungus. Although her remaining nails do appear to have a yellow discoloration and there may already fungus in his nails as well. -Nails sharply debrided O12 without complications. -Followup in 3 months or sooner if any palms are to arise. Call with any questions or concerns or change in symptoms.

## 2013-11-08 NOTE — Patient Instructions (Signed)

## 2013-11-12 ENCOUNTER — Other Ambulatory Visit: Payer: Medicare Other

## 2013-11-12 ENCOUNTER — Ambulatory Visit: Payer: Medicare Other | Admitting: Nurse Practitioner

## 2013-11-12 ENCOUNTER — Other Ambulatory Visit (HOSPITAL_BASED_OUTPATIENT_CLINIC_OR_DEPARTMENT_OTHER): Payer: Medicare Other

## 2013-11-12 ENCOUNTER — Ambulatory Visit: Payer: Medicare Other

## 2013-11-12 DIAGNOSIS — C482 Malignant neoplasm of peritoneum, unspecified: Secondary | ICD-10-CM

## 2013-11-12 DIAGNOSIS — D649 Anemia, unspecified: Secondary | ICD-10-CM

## 2013-11-12 LAB — CBC WITH DIFFERENTIAL/PLATELET
BASO%: 0.3 % (ref 0.0–2.0)
BASOS ABS: 0 10*3/uL (ref 0.0–0.1)
EOS%: 0.7 % (ref 0.0–7.0)
Eosinophils Absolute: 0 10*3/uL (ref 0.0–0.5)
HCT: 30.8 % — ABNORMAL LOW (ref 34.8–46.6)
HEMOGLOBIN: 10.4 g/dL — AB (ref 11.6–15.9)
LYMPH%: 16.7 % (ref 14.0–49.7)
MCH: 33 pg (ref 25.1–34.0)
MCHC: 33.8 g/dL (ref 31.5–36.0)
MCV: 97.8 fL (ref 79.5–101.0)
MONO#: 0.4 10*3/uL (ref 0.1–0.9)
MONO%: 15 % — AB (ref 0.0–14.0)
NEUT#: 2 10*3/uL (ref 1.5–6.5)
NEUT%: 67.3 % (ref 38.4–76.8)
Platelets: 62 10*3/uL — ABNORMAL LOW (ref 145–400)
RBC: 3.15 10*6/uL — AB (ref 3.70–5.45)
RDW: 18 % — ABNORMAL HIGH (ref 11.2–14.5)
WBC: 2.9 10*3/uL — ABNORMAL LOW (ref 3.9–10.3)
lymph#: 0.5 10*3/uL — ABNORMAL LOW (ref 0.9–3.3)

## 2013-11-12 LAB — COMPREHENSIVE METABOLIC PANEL (CC13)
ALBUMIN: 3.4 g/dL — AB (ref 3.5–5.0)
ALK PHOS: 116 U/L (ref 40–150)
ALT: 18 U/L (ref 0–55)
AST: 21 U/L (ref 5–34)
Anion Gap: 10 mEq/L (ref 3–11)
BILIRUBIN TOTAL: 0.27 mg/dL (ref 0.20–1.20)
BUN: 23.1 mg/dL (ref 7.0–26.0)
CO2: 24 mEq/L (ref 22–29)
Calcium: 9.9 mg/dL (ref 8.4–10.4)
Chloride: 105 mEq/L (ref 98–109)
Creatinine: 1.3 mg/dL — ABNORMAL HIGH (ref 0.6–1.1)
Glucose: 164 mg/dl — ABNORMAL HIGH (ref 70–140)
Potassium: 4.8 mEq/L (ref 3.5–5.1)
Sodium: 139 mEq/L (ref 136–145)
Total Protein: 6.3 g/dL — ABNORMAL LOW (ref 6.4–8.3)

## 2013-11-12 MED ORDER — DARBEPOETIN ALFA-POLYSORBATE 200 MCG/0.4ML IJ SOLN: 200.0000 ug | Freq: Once | INTRAMUSCULAR | Status: DC

## 2013-11-15 ENCOUNTER — Other Ambulatory Visit: Payer: Self-pay | Admitting: *Deleted

## 2013-11-15 DIAGNOSIS — C482 Malignant neoplasm of peritoneum, unspecified: Secondary | ICD-10-CM

## 2013-11-18 ENCOUNTER — Other Ambulatory Visit: Payer: Self-pay | Admitting: Oncology

## 2013-11-18 ENCOUNTER — Other Ambulatory Visit (HOSPITAL_BASED_OUTPATIENT_CLINIC_OR_DEPARTMENT_OTHER): Payer: Medicare Other

## 2013-11-18 ENCOUNTER — Ambulatory Visit (HOSPITAL_BASED_OUTPATIENT_CLINIC_OR_DEPARTMENT_OTHER): Payer: Medicare Other | Admitting: Nurse Practitioner

## 2013-11-18 ENCOUNTER — Telehealth: Payer: Self-pay | Admitting: Nurse Practitioner

## 2013-11-18 ENCOUNTER — Ambulatory Visit (HOSPITAL_BASED_OUTPATIENT_CLINIC_OR_DEPARTMENT_OTHER): Payer: Medicare Other

## 2013-11-18 ENCOUNTER — Encounter: Payer: Self-pay | Admitting: Nurse Practitioner

## 2013-11-18 ENCOUNTER — Telehealth: Payer: Self-pay | Admitting: *Deleted

## 2013-11-18 VITALS — BP 137/74 | HR 91 | Temp 98.1°F | Resp 18 | Ht 65.0 in | Wt 170.8 lb

## 2013-11-18 DIAGNOSIS — C482 Malignant neoplasm of peritoneum, unspecified: Secondary | ICD-10-CM

## 2013-11-18 DIAGNOSIS — R5381 Other malaise: Secondary | ICD-10-CM

## 2013-11-18 DIAGNOSIS — E119 Type 2 diabetes mellitus without complications: Secondary | ICD-10-CM

## 2013-11-18 DIAGNOSIS — D649 Anemia, unspecified: Secondary | ICD-10-CM

## 2013-11-18 DIAGNOSIS — R5383 Other fatigue: Secondary | ICD-10-CM

## 2013-11-18 DIAGNOSIS — T451X5A Adverse effect of antineoplastic and immunosuppressive drugs, initial encounter: Secondary | ICD-10-CM

## 2013-11-18 DIAGNOSIS — D6481 Anemia due to antineoplastic chemotherapy: Secondary | ICD-10-CM

## 2013-11-18 DIAGNOSIS — Z5111 Encounter for antineoplastic chemotherapy: Secondary | ICD-10-CM

## 2013-11-18 LAB — CBC WITH DIFFERENTIAL/PLATELET
BASO%: 0.2 % (ref 0.0–2.0)
BASOS ABS: 0 10*3/uL (ref 0.0–0.1)
EOS%: 0.5 % (ref 0.0–7.0)
Eosinophils Absolute: 0 10*3/uL (ref 0.0–0.5)
HEMATOCRIT: 29.6 % — AB (ref 34.8–46.6)
HEMOGLOBIN: 9.8 g/dL — AB (ref 11.6–15.9)
LYMPH%: 10.2 % — AB (ref 14.0–49.7)
MCH: 33.2 pg (ref 25.1–34.0)
MCHC: 33 g/dL (ref 31.5–36.0)
MCV: 100.7 fL (ref 79.5–101.0)
MONO#: 0.4 10*3/uL (ref 0.1–0.9)
MONO%: 15.2 % — AB (ref 0.0–14.0)
NEUT%: 73.9 % (ref 38.4–76.8)
NEUTROS ABS: 2 10*3/uL (ref 1.5–6.5)
PLATELETS: 142 10*3/uL — AB (ref 145–400)
RBC: 2.94 10*6/uL — ABNORMAL LOW (ref 3.70–5.45)
RDW: 21.3 % — ABNORMAL HIGH (ref 11.2–14.5)
WBC: 2.7 10*3/uL — AB (ref 3.9–10.3)
lymph#: 0.3 10*3/uL — ABNORMAL LOW (ref 0.9–3.3)

## 2013-11-18 LAB — COMPREHENSIVE METABOLIC PANEL (CC13)
ALK PHOS: 111 U/L (ref 40–150)
ALT: 15 U/L (ref 0–55)
AST: 19 U/L (ref 5–34)
Albumin: 3.2 g/dL — ABNORMAL LOW (ref 3.5–5.0)
Anion Gap: 11 mEq/L (ref 3–11)
BILIRUBIN TOTAL: 0.28 mg/dL (ref 0.20–1.20)
BUN: 22.1 mg/dL (ref 7.0–26.0)
CO2: 24 mEq/L (ref 22–29)
Calcium: 9.3 mg/dL (ref 8.4–10.4)
Chloride: 102 mEq/L (ref 98–109)
Creatinine: 1.3 mg/dL — ABNORMAL HIGH (ref 0.6–1.1)
Glucose: 373 mg/dl — ABNORMAL HIGH (ref 70–140)
Potassium: 4.2 mEq/L (ref 3.5–5.1)
Sodium: 137 mEq/L (ref 136–145)
Total Protein: 5.9 g/dL — ABNORMAL LOW (ref 6.4–8.3)

## 2013-11-18 MED ORDER — DEXAMETHASONE SODIUM PHOSPHATE 20 MG/5ML IJ SOLN
20.0000 mg | Freq: Once | INTRAMUSCULAR | Status: AC
  Administered 2013-11-18: 20 mg via INTRAVENOUS

## 2013-11-18 MED ORDER — HEPARIN SOD (PORK) LOCK FLUSH 100 UNIT/ML IV SOLN
500.0000 [IU] | Freq: Once | INTRAVENOUS | Status: AC | PRN
  Administered 2013-11-18: 500 [IU]
  Filled 2013-11-18: qty 5

## 2013-11-18 MED ORDER — SODIUM CHLORIDE 0.9 % IV SOLN
197.4000 mg | Freq: Once | INTRAVENOUS | Status: AC
  Administered 2013-11-18: 200 mg via INTRAVENOUS
  Filled 2013-11-18: qty 20

## 2013-11-18 MED ORDER — SODIUM CHLORIDE 0.9 % IV SOLN
Freq: Once | INTRAVENOUS | Status: AC
  Administered 2013-11-18: 13:00:00 via INTRAVENOUS

## 2013-11-18 MED ORDER — SODIUM CHLORIDE 0.9 % IJ SOLN
10.0000 mL | INTRAMUSCULAR | Status: DC | PRN
  Administered 2013-11-18: 10 mL
  Filled 2013-11-18: qty 10

## 2013-11-18 MED ORDER — ONDANSETRON 16 MG/50ML IVPB (CHCC)
INTRAVENOUS | Status: AC
  Filled 2013-11-18: qty 16

## 2013-11-18 MED ORDER — SODIUM CHLORIDE 0.9 % IV SOLN
600.0000 mg/m2 | Freq: Once | INTRAVENOUS | Status: AC
  Administered 2013-11-18: 1120 mg via INTRAVENOUS
  Filled 2013-11-18: qty 56

## 2013-11-18 MED ORDER — CARBOPLATIN CHEMO INTRADERMAL TEST DOSE 100MCG/0.02ML
100.0000 ug | Freq: Once | INTRADERMAL | Status: AC
  Administered 2013-11-18: 100 ug via INTRADERMAL
  Filled 2013-11-18: qty 0.01

## 2013-11-18 MED ORDER — INSULIN REGULAR HUMAN 100 UNIT/ML IJ SOLN
8.0000 [IU] | Freq: Once | INTRAMUSCULAR | Status: AC
  Administered 2013-11-18: 8 [IU] via SUBCUTANEOUS
  Filled 2013-11-18: qty 0.08

## 2013-11-18 MED ORDER — DARBEPOETIN ALFA-POLYSORBATE 200 MCG/0.4ML IJ SOLN
200.0000 ug | Freq: Once | INTRAMUSCULAR | Status: AC
  Administered 2013-11-18: 200 ug via SUBCUTANEOUS
  Filled 2013-11-18: qty 0.4

## 2013-11-18 MED ORDER — ONDANSETRON 16 MG/50ML IVPB (CHCC)
16.0000 mg | Freq: Once | INTRAVENOUS | Status: AC
  Administered 2013-11-18: 16 mg via INTRAVENOUS

## 2013-11-18 MED ORDER — DEXAMETHASONE SODIUM PHOSPHATE 20 MG/5ML IJ SOLN
INTRAMUSCULAR | Status: AC
  Filled 2013-11-18: qty 5

## 2013-11-18 NOTE — Patient Instructions (Addendum)
[facility] Discharge Instructions for Patients Receiving Chemotherapy  Today you received the following chemotherapy agents: Cytoxan and Carboplatin  To help prevent nausea and vomiting after your treatment, we encourage you to take your nausea medication: compazine 10mg  every 6 hours; zofran 8 mg 2 x a day.   If you develop nausea and vomiting that is not controlled by your nausea medication, call the clinic.   BELOW ARE SYMPTOMS THAT SHOULD BE REPORTED IMMEDIATELY:  *FEVER GREATER THAN 100.5 F  *CHILLS WITH OR WITHOUT FEVER  NAUSEA AND VOMITING THAT IS NOT CONTROLLED WITH YOUR NAUSEA MEDICATION  *UNUSUAL SHORTNESS OF BREATH  *UNUSUAL BRUISING OR BLEEDING  TENDERNESS IN MOUTH AND THROAT WITH OR WITHOUT PRESENCE OF ULCERS  *URINARY PROBLEMS  *BOWEL PROBLEMS  UNUSUAL RASH Items with * indicate a potential emergency and should be followed up as soon as possible.  Feel free to call the clinic you have any questions or concerns. The clinic phone number is [phone].   Darbepoetin Alfa injection What is this medicine? DARBEPOETIN ALFA (dar be POE e tin AL fa) helps your body make more red blood cells. It is used to treat anemia caused by chronic kidney failure and chemotherapy. This medicine may be used for other purposes; ask your health care provider or pharmacist if you have questions. COMMON BRAND NAME(S): Aranesp What should I tell my health care provider before I take this medicine? They need to know if you have any of these conditions: -blood clotting disorders or history of blood clots -cancer patient not on chemotherapy -cystic fibrosis -heart disease, such as angina, heart failure, or a history of a heart attack -hemoglobin level of 12 g/dL or greater -high blood pressure -low levels of folate, iron, or vitamin B12 -seizures -an unusual or allergic reaction to darbepoetin, erythropoietin, albumin, hamster proteins, latex, other medicines,  foods, dyes, or preservatives -pregnant or trying to get pregnant -breast-feeding How should I use this medicine? This medicine is for injection into a vein or under the skin. It is usually given by a health care professional in a hospital or clinic setting. If you get this medicine at home, you will be taught how to prepare and give this medicine. Do not shake the solution before you withdraw a dose. Use exactly as directed. Take your medicine at regular intervals. Do not take your medicine more often than directed. It is important that you put your used needles and syringes in a special sharps container. Do not put them in a trash can. If you do not have a sharps container, call your pharmacist or healthcare provider to get one. Talk to your pediatrician regarding the use of this medicine in children. While this medicine may be used in children as young as 1 year for selected conditions, precautions do apply. Overdosage: If you think you have taken too much of this medicine contact a poison control center or emergency room at once. NOTE: This medicine is only for you. Do not share this medicine with others. What if I miss a dose? If you miss a dose, take it as soon as you can. If it is almost time for your next dose, take only that dose. Do not take double or extra doses. What may interact with this medicine? Do not take this medicine with any of the following medications: -epoetin alfa This list may not describe all possible interactions. Give your health care provider a list of all the medicines, herbs, non-prescription drugs, or dietary  supplements you use. Also tell them if you smoke, drink alcohol, or use illegal drugs. Some items may interact with your medicine. What should I watch for while using this medicine? Visit your prescriber or health care professional for regular checks on your progress and for the needed blood tests and blood pressure measurements. It is especially important for  the doctor to make sure your hemoglobin level is in the desired range, to limit the risk of potential side effects and to give you the best benefit. Keep all appointments for any recommended tests. Check your blood pressure as directed. Ask your doctor what your blood pressure should be and when you should contact him or her. As your body makes more red blood cells, you may need to take iron, folic acid, or vitamin B supplements. Ask your doctor or health care provider which products are right for you. If you have kidney disease continue dietary restrictions, even though this medication can make you feel better. Talk with your doctor or health care professional about the foods you eat and the vitamins that you take. What side effects may I notice from receiving this medicine? Side effects that you should report to your doctor or health care professional as soon as possible: -allergic reactions like skin rash, itching or hives, swelling of the face, lips, or tongue -breathing problems -changes in vision -chest pain -confusion, trouble speaking or understanding -feeling faint or lightheaded, falls -high blood pressure -muscle aches or pains -pain, swelling, warmth in the leg -rapid weight gain -severe headaches -sudden numbness or weakness of the face, arm or leg -trouble walking, dizziness, loss of balance or coordination -seizures (convulsions) -swelling of the ankles, feet, hands -unusually weak or tired Side effects that usually do not require medical attention (report to your doctor or health care professional if they continue or are bothersome): -diarrhea -fever, chills (flu-like symptoms) -headaches -nausea, vomiting -redness, stinging, or swelling at site where injected This list may not describe all possible side effects. Call your doctor for medical advice about side effects. You may report side effects to FDA at 1-800-FDA-1088. Where should I keep my medicine? Keep out of the  reach of children. Store in a refrigerator between 2 and 8 degrees C (36 and 46 degrees F). Do not freeze. Do not shake. Throw away any unused portion if using a single-dose vial. Throw away any unused medicine after the expiration date. NOTE: This sheet is a summary. It may not cover all possible information. If you have questions about this medicine, talk to your doctor, pharmacist, or health care provider.  2015, [employer]vier/[employer] Standard. (2008-02-05 10:23:57)

## 2013-11-18 NOTE — Telephone Encounter (Signed)
Per staff message and POF I have scheduled appts. Advised scheduler of appts. JMW  

## 2013-11-18 NOTE — Telephone Encounter (Signed)
per pof to CX inj 9/21-r/s 9/28-sch appt-gave pt copy of sch-sent MW emailt o sch trmt-pt aware will sch after MD appt

## 2013-11-18 NOTE — Progress Notes (Signed)
ID: Regina West   DOB: 07-22-1929  MR#: [id number]  NID#:[id number]  PCP: [name], MD GYN: SU: Stephan Minister, MD OTHER MD: Larey Seat, MD  CHIEF COMPLAINT:  Recurrent ovarian cancer TREATMENT: under active chemotherapy  HISTORY OF PRESENT ILLNESS: From the original intake nodes:  The patient originally presented in the summer of 2010 with cramps and abdominal distension.  I do not have a copy of the initial evaluation, but on November 26, 2008, the patient underwent optimal debulking with bilateral salpingo-oophorectomy, omentectomy, and the placement of an intraperitoneal port.  The pathology from that procedure (Accession Number [id number] at [facility]) showed first of all, significant involvement of the omentum, minimal involvement of the right ovary and right fallopian tube and negative on the left ovary and fallopian tube, with neither ovary being enlarged, consistent with a primary peritoneal serous adenocarcinoma, described as moderately differentiated.  The sample included no lymph nodes.    The patient had an intraperitoneal port placed in the same surgery and was treated with intraperitoneal and IV chemotherapy according to GOG-252 but I am not sure which arm she was in.  (Arm 2 did carboplatin intraperitoneally and Taxol IV.  Arm 3 did cisplatin and paclitaxel intraperitoneally with paclitaxel IV.)  All arms received bevacizumab.  Unfortunately, after 4 cycles of treatment, she had acute mental status changes and was admitted here January of 2011 with what proved to be posterior reversible leukoencephalopathy and SIADH.  She had seizures, aphasia, and required intubation.  Once the patient recovered from this, she was treated with 2 cycles of single-agent carboplatin (I do not have the AUC). Her last adjuvant treatment was May 12, 2009, and her CA-125 at that time was 10.3.  Her intraperitoneal port was removed in April of 2011.    More recently, the patient was in routine  follow up when her CA-125 was found to jump up to 2,269.7 (March 26, 2010).  This is 10 months after her last prior chemotherapy.  She had CTs of the chest, abdomen and pelvis January 26th which showed ascites and enhancing peritoneal nodularity. She had had similar findings at presentation but these had completely resolved by the time she finished treatment in March of 2011. The patient was felt to be in first relapse.   Her subsequent history is as detailed below  INTERVAL HISTORY:  [name] returns today for follow up of her ovarian cancer, accompanied by her daughter-in-law [name]. Today is day 1, cycle 7 of cyclophosphamide and carboplatin, infused every 21 days. Since her last visit Regina West had a blood transfusion and has been getting aranesp every 2 weeks for her anemia. She still complains of fatigue, but she states people have told her that she looks better. The interval history is unremarkable. She plans to visit her grandson during his fall break this October.    REVIEW OF SYSTEMS:  Regina West denies fevers, chills, nausea, vomiting, or changes in bowel or bladder habits. The tips of her fingers continue to feel mild numbness, but this is not new and has not gotten worse. Her balance is off and she walks with a cane or uses furniture to ambulate about the house. She has shortness of breath with exertion, but she denies chest pain or cough. A detailed review of systems was otherwise negative.   PAST MEDICAL HISTORY: Significant for hysterectomy at the age of 7 with concurrent bladder "tuck up."  She also underwent appendectomy during that procedure.  She is status post prior bilateral cataract surgery.  She is status post tonsillectomy and adenoidectomy.  She has a history of hypertension and diabetes. She has a history of diverticulosis.  She has a large hiatal hernia.  She has degenerative disk disease.  She has been noted to have coronary calcifications and she has hyperlipidemia.  There is a  history of remote tobacco abuse. Seizure disorder as per HPI  FAMILY HISTORY The patient's father died at the age of 56 after heart surgery for aortic stenosis.  The patient's mother died at the age of 85.  The patient has one brother in good health.  There is no other family history to her knowledge of ovarian or breast cancer.    GYNECOLOGIC HISTORY: Menarche at age 89.  She is GX P3 with menopause in her late 61s.  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 She is currently residing in [place]. Her husband was in sales and had a history of Parkinson's disease.  He died following a fall in the year 2000.  The patient's son Regina West lives in [place], and has a history of MS.  Daughter Regina West lives in [place]. She is a Agricultural engineer.  Son Regina West lives in [place], [place] and is in Press photographer. The patient has 8 grandchildren. She attends the [employer].  She is a good friend of our patients J. and [place] DIRECTIVES: in place  HEALTH MAINTENANCE:  (Updated December 2014) History  Substance Use Topics  . Smoking status: Former Smoker    Quit date: 05/23/1964  . Smokeless tobacco: Never Used  . Alcohol Use: 0.0 oz/week     Comment: rarely     Colonoscopy: 2008  PAP: UTD  MM:  Declines   Allergies  Allergen Reactions  . Avastin [Bevacizumab] Other (See Comments)    Swelling of the brain     Current Outpatient Prescriptions  Medication Sig Dispense Refill  . acyclovir (ZOVIRAX) 400 MG tablet Take 1 tablet (400 mg total) by mouth 2 (two) times daily.  60 tablet  12  . antiseptic oral rinse (BIOTENE) LIQD 15 mLs by Mouth Rinse route 2 (two) times daily.  1 Bottle  0  . cholecalciferol (VITAMIN D) 1000 UNITS tablet Take 1,000 Units by mouth 2 (two) times daily.      Marland Kitchen dexamethasone (DECADRON) 4 MG tablet Take 2 tablets (8 mg  total) by mouth 2 (two) times daily with a meal. Start the day after chemotherapy for 3 days.  30 tablet  1  . levETIRAcetam (KEPPRA) 250 MG tablet Take 1 tablet (250 mg total) by mouth 2 (two) times daily.  60 tablet  11  . lidocaine-prilocaine (EMLA) cream Apply 1 application topically as needed.  30 g  0  . nitrofurantoin (MACRODANTIN) 50 MG capsule TAKE TWO CAPSULES BY MOUTH ONCE DAILY  60 capsule  1  . omeprazole (PRILOSEC) 40 MG capsule Take 1 capsule (40 mg total) by mouth at bedtime.  30 capsule  2  . ondansetron (ZOFRAN) 8 MG tablet Take 1 tablet (8 mg total) by mouth 2 (two) times daily. Start the day after chemo for 3 days. Then take as needed for nausea or vomiting.  30 tablet  1  . prochlorperazine (COMPAZINE) 10 MG tablet Take 1 tablet (10 mg total) by mouth every 6 (six) hours as needed (Nausea or vomiting).  30 tablet  1  . VIIBRYD 40 MG TABS        No current facility-administered medications for this visit.   Facility-Administered Medications Ordered in Other Visits  Medication Dose Route Frequency Provider Last Rate Last Dose  . CARBOplatin CHEMO intradermal Test Dose 100 mcg/0.53ml  100 mcg Intradermal Once Chauncey Cruel, MD      . sodium chloride 0.9 % injection 10 mL  10 mL Intracatheter PRN Chauncey Cruel, MD   10 mL at 10/25/13 1608    OBJECTIVE: Elderly white woman who appears stated age 36 Vitals:   11/18/13 1103  BP: 137/74  Pulse: 91  Temp: 98.1 F (36.7 C)  Resp: 18     Body mass index is 28.42 kg/(m^2).    ECOG FS: 1 Filed Weights   11/18/13 1103  Weight: 170 lb 12.8 oz (77.474 kg)   Sclerae unicteric, pupils equal and reactive Oropharynx clear and moist-- no thrush No cervical or supraclavicular adenopathy Lungs no rales or rhonchi Heart regular rate and rhythm Abd soft, nontender, positive bowel sounds MSK no focal spinal tenderness, no upper extremity lymphedema Neuro: nonfocal, well oriented, appropriate affect Breasts: deferred  Lab  Results  Component Value Date   WBC 2.7* 11/18/2013   NEUTROABS 2.0 11/18/2013   HGB 9.8* 11/18/2013   HCT 29.6* 11/18/2013   MCV 100.7 11/18/2013   PLT 142* 11/18/2013       Chemistry      Component Value Date/Time   NA 137 11/18/2013 1053   NA 136 10/17/2011 1118   NA 143 05/12/2011 1337   K 4.2 11/18/2013 1053   K 3.9 10/17/2011 1118   K 4.1 05/12/2011 1337   CL 100 08/20/2012 1124   CL 100 10/17/2011 1118   CL 92* 05/12/2011 1337   CO2 24 11/18/2013 1053   CO2 27 10/17/2011 1118   CO2 32 05/12/2011 1337   West 22.1 11/18/2013 1053   West 25* 10/17/2011 1118   West 19 05/12/2011 1337   CREATININE 1.3* 11/18/2013 1053   CREATININE 1.10 10/17/2011 1118   CREATININE 1.3* 05/12/2011 1337      Component Value Date/Time   CALCIUM 9.3 11/18/2013 1053   CALCIUM 9.8 10/17/2011 1118   CALCIUM 9.8 05/12/2011 1337   ALKPHOS 111 11/18/2013 1053   ALKPHOS 109 10/17/2011 1118   ALKPHOS 95* 05/12/2011 1337   AST 19 11/18/2013 1053   AST 22 10/17/2011 1118   AST 40* 05/12/2011 1337   ALT 15 11/18/2013 1053   ALT 14 10/17/2011 1118   ALT 28 05/12/2011 1337   BILITOT 0.28 11/18/2013 1053   BILITOT 0.4 10/17/2011 1118   BILITOT 0.60 05/12/2011 1337     Results for Trupiano, Chanya C    Ref. Range 05/31/2013 12:10 06/21/2013 12:56 07/08/2013 09:33 08/05/2013 11:26 09/02/2013 10:38  CA 125 Latest Range: 0.0-30.2 U/mL 115.6 (H) 583.6 (H) 184.4 (H) 30.8 (H) 17.1   STUDIES: No results found.  ASSESSMENT: 80 y.o.  [place] woman   (1) status post optimal debulking of a primary peritoneal serous adenocarcinoma September 2010, the tumor being moderately differentiated, pT3c NX (stage IIIC), treated according to GOG 252 with  intraperitoneal platinum and paclitaxel x4 given along with bevacizumab, complicated by SIADH after cycle 4, also with posterior reversible leukoencephalopathy.  After these problems resolved she received 2 additional cycles of single-agent carboplatin completed in March 2011  (2)  She had her first recurrence  February 2012  treated with carboplatin and Gemzar for a total  of 6 cycles between February and June 2012.  Her CEA-125 had normalized before the beginning of cycle 5.   (3) second recurrence documented February 2013,  treated with carboplatin/ doxil x 6, completed 10/03/2011, with a good response noted on restaging scans  (4) third recurrence documented February 2014, receiving single-agent carboplatin every 3 weeks, first dose 04/30/2012, completed 08/20/2012  (5) fourth recurrence documented by abdominal symptoms and a rising CA 125 November 2014, receiving Abraxane, initially on days one and 8 of each 21 day cycle, with first dose given on 01/16/2013; starting 04/22/2013 she received the Abraxane every 2 weeks--rising CA 125 noted 05/31/2013  (6) started cyclophosphamide/ with "low dose" carboplatin 06/17/2013, to be repeated every 21 days x8 doses  PLAN:  Fatigue continues to be a concern for Shinika, however physically she looks better than she did on her last visit. She is not in a wheelchair, and the color has returned to her face. The labs were reviewed in detail with her and she will receive an aranesp injection while in infusion today. She will proceed with day 1, cycle 7 of the carboplatin and cyclophosphamide as well.  The plan is to complete at least 8 cycles.   Depending on tolerance, in the future we may consider broadening the treatment interval to 4 weeks. Right now, Navpreet understands and agrees with the plan. She knows a treatment goal in her case is control. She has been encouraged to call with any issues that might arise before her next visit here.  Marcelino Duster, NP

## 2013-11-18 NOTE — Progress Notes (Signed)
Glucose 373 today. Received order from Dr. Jana Hakim for 8 units regular insulin sq.  This was done @ 1331.

## 2013-11-19 LAB — CA 125(PREVIOUS METHOD): CA 125: 11.2 U/mL (ref 0.0–30.2)

## 2013-11-19 LAB — CA 125: CA 125: 15 U/mL (ref <35)

## 2013-11-25 ENCOUNTER — Other Ambulatory Visit: Payer: Medicare Other

## 2013-11-25 ENCOUNTER — Ambulatory Visit: Payer: Medicare Other

## 2013-12-02 ENCOUNTER — Ambulatory Visit (HOSPITAL_BASED_OUTPATIENT_CLINIC_OR_DEPARTMENT_OTHER): Payer: Medicare Other

## 2013-12-02 ENCOUNTER — Other Ambulatory Visit (HOSPITAL_BASED_OUTPATIENT_CLINIC_OR_DEPARTMENT_OTHER): Payer: Medicare Other

## 2013-12-02 VITALS — BP 112/69 | HR 84 | Temp 98.6°F

## 2013-12-02 DIAGNOSIS — C482 Malignant neoplasm of peritoneum, unspecified: Secondary | ICD-10-CM

## 2013-12-02 DIAGNOSIS — D6481 Anemia due to antineoplastic chemotherapy: Secondary | ICD-10-CM

## 2013-12-02 DIAGNOSIS — D649 Anemia, unspecified: Secondary | ICD-10-CM

## 2013-12-02 DIAGNOSIS — T451X5A Adverse effect of antineoplastic and immunosuppressive drugs, initial encounter: Secondary | ICD-10-CM

## 2013-12-02 LAB — CBC WITH DIFFERENTIAL/PLATELET
BASO%: 0.5 % (ref 0.0–2.0)
Basophils Absolute: 0 10*3/uL (ref 0.0–0.1)
EOS ABS: 0 10*3/uL (ref 0.0–0.5)
EOS%: 0.5 % (ref 0.0–7.0)
HCT: 27.3 % — ABNORMAL LOW (ref 34.8–46.6)
HGB: 9 g/dL — ABNORMAL LOW (ref 11.6–15.9)
LYMPH#: 0.4 10*3/uL — AB (ref 0.9–3.3)
LYMPH%: 18 % (ref 14.0–49.7)
MCH: 32.7 pg (ref 25.1–34.0)
MCHC: 33 g/dL (ref 31.5–36.0)
MCV: 99.3 fL (ref 79.5–101.0)
MONO#: 0.4 10*3/uL (ref 0.1–0.9)
MONO%: 18.5 % — ABNORMAL HIGH (ref 0.0–14.0)
NEUT#: 1.3 10*3/uL — ABNORMAL LOW (ref 1.5–6.5)
NEUT%: 62.5 % (ref 38.4–76.8)
NRBC: 0 % (ref 0–0)
Platelets: 70 10*3/uL — ABNORMAL LOW (ref 145–400)
RBC: 2.75 10*6/uL — ABNORMAL LOW (ref 3.70–5.45)
RDW: 19.1 % — ABNORMAL HIGH (ref 11.2–14.5)
WBC: 2.1 10*3/uL — AB (ref 3.9–10.3)

## 2013-12-02 LAB — COMPREHENSIVE METABOLIC PANEL (CC13)
ALT: 10 U/L (ref 0–55)
ANION GAP: 11 meq/L (ref 3–11)
AST: 16 U/L (ref 5–34)
Albumin: 3.1 g/dL — ABNORMAL LOW (ref 3.5–5.0)
Alkaline Phosphatase: 118 U/L (ref 40–150)
BUN: 21 mg/dL (ref 7.0–26.0)
CALCIUM: 10.2 mg/dL (ref 8.4–10.4)
CHLORIDE: 101 meq/L (ref 98–109)
CO2: 24 meq/L (ref 22–29)
Creatinine: 1.5 mg/dL — ABNORMAL HIGH (ref 0.6–1.1)
Glucose: 388 mg/dl — ABNORMAL HIGH (ref 70–140)
POTASSIUM: 4.6 meq/L (ref 3.5–5.1)
SODIUM: 136 meq/L (ref 136–145)
TOTAL PROTEIN: 6.4 g/dL (ref 6.4–8.3)
Total Bilirubin: 0.52 mg/dL (ref 0.20–1.20)

## 2013-12-02 LAB — TECHNOLOGIST REVIEW

## 2013-12-02 MED ORDER — DARBEPOETIN ALFA-POLYSORBATE 200 MCG/0.4ML IJ SOLN
200.0000 ug | Freq: Once | INTRAMUSCULAR | Status: AC
  Administered 2013-12-02: 200 ug via SUBCUTANEOUS
  Filled 2013-12-02: qty 0.4

## 2013-12-09 ENCOUNTER — Ambulatory Visit (HOSPITAL_BASED_OUTPATIENT_CLINIC_OR_DEPARTMENT_OTHER): Payer: Medicare Other

## 2013-12-09 ENCOUNTER — Telehealth: Payer: Self-pay | Admitting: Nurse Practitioner

## 2013-12-09 ENCOUNTER — Other Ambulatory Visit (HOSPITAL_BASED_OUTPATIENT_CLINIC_OR_DEPARTMENT_OTHER): Payer: Medicare Other

## 2013-12-09 ENCOUNTER — Ambulatory Visit (HOSPITAL_BASED_OUTPATIENT_CLINIC_OR_DEPARTMENT_OTHER): Payer: Medicare Other | Admitting: Nurse Practitioner

## 2013-12-09 ENCOUNTER — Other Ambulatory Visit: Payer: Self-pay | Admitting: Oncology

## 2013-12-09 ENCOUNTER — Encounter: Payer: Self-pay | Admitting: Nurse Practitioner

## 2013-12-09 ENCOUNTER — Telehealth: Payer: Self-pay | Admitting: *Deleted

## 2013-12-09 VITALS — BP 131/59 | HR 92 | Temp 98.4°F | Resp 19 | Ht 65.0 in | Wt 172.4 lb

## 2013-12-09 DIAGNOSIS — Z5111 Encounter for antineoplastic chemotherapy: Secondary | ICD-10-CM

## 2013-12-09 DIAGNOSIS — C482 Malignant neoplasm of peritoneum, unspecified: Secondary | ICD-10-CM

## 2013-12-09 DIAGNOSIS — T451X5A Adverse effect of antineoplastic and immunosuppressive drugs, initial encounter: Secondary | ICD-10-CM

## 2013-12-09 DIAGNOSIS — R5383 Other fatigue: Secondary | ICD-10-CM

## 2013-12-09 DIAGNOSIS — D61810 Antineoplastic chemotherapy induced pancytopenia: Secondary | ICD-10-CM

## 2013-12-09 DIAGNOSIS — E118 Type 2 diabetes mellitus with unspecified complications: Secondary | ICD-10-CM

## 2013-12-09 DIAGNOSIS — D649 Anemia, unspecified: Secondary | ICD-10-CM

## 2013-12-09 LAB — COMPREHENSIVE METABOLIC PANEL (CC13)
ALT: 17 U/L (ref 0–55)
ANION GAP: 9 meq/L (ref 3–11)
AST: 18 U/L (ref 5–34)
Albumin: 3.3 g/dL — ABNORMAL LOW (ref 3.5–5.0)
Alkaline Phosphatase: 109 U/L (ref 40–150)
BILIRUBIN TOTAL: 0.33 mg/dL (ref 0.20–1.20)
BUN: 17 mg/dL (ref 7.0–26.0)
CO2: 25 meq/L (ref 22–29)
CREATININE: 1.4 mg/dL — AB (ref 0.6–1.1)
Calcium: 10.2 mg/dL (ref 8.4–10.4)
Chloride: 101 mEq/L (ref 98–109)
GLUCOSE: 240 mg/dL — AB (ref 70–140)
Potassium: 4.4 mEq/L (ref 3.5–5.1)
Sodium: 135 mEq/L — ABNORMAL LOW (ref 136–145)
TOTAL PROTEIN: 6.1 g/dL — AB (ref 6.4–8.3)

## 2013-12-09 LAB — CBC WITH DIFFERENTIAL/PLATELET
BASO%: 0.2 % (ref 0.0–2.0)
Basophils Absolute: 0 10*3/uL (ref 0.0–0.1)
EOS ABS: 0 10*3/uL (ref 0.0–0.5)
EOS%: 0.1 % (ref 0.0–7.0)
HEMATOCRIT: 26.1 % — AB (ref 34.8–46.6)
HGB: 8.6 g/dL — ABNORMAL LOW (ref 11.6–15.9)
LYMPH%: 4.5 % — AB (ref 14.0–49.7)
MCH: 34 pg (ref 25.1–34.0)
MCHC: 32.8 g/dL (ref 31.5–36.0)
MCV: 103.5 fL — ABNORMAL HIGH (ref 79.5–101.0)
MONO#: 0.6 10*3/uL (ref 0.1–0.9)
MONO%: 10.6 % (ref 0.0–14.0)
NEUT#: 4.6 10*3/uL (ref 1.5–6.5)
NEUT%: 84.6 % — AB (ref 38.4–76.8)
PLATELETS: 128 10*3/uL — AB (ref 145–400)
RBC: 2.52 10*6/uL — AB (ref 3.70–5.45)
RDW: 22.5 % — ABNORMAL HIGH (ref 11.2–14.5)
WBC: 5.4 10*3/uL (ref 3.9–10.3)
lymph#: 0.2 10*3/uL — ABNORMAL LOW (ref 0.9–3.3)

## 2013-12-09 MED ORDER — ONDANSETRON 16 MG/50ML IVPB (CHCC)
16.0000 mg | Freq: Once | INTRAVENOUS | Status: AC
  Administered 2013-12-09: 16 mg via INTRAVENOUS

## 2013-12-09 MED ORDER — ONDANSETRON 16 MG/50ML IVPB (CHCC)
INTRAVENOUS | Status: AC
  Filled 2013-12-09: qty 16

## 2013-12-09 MED ORDER — DEXAMETHASONE SODIUM PHOSPHATE 20 MG/5ML IJ SOLN
20.0000 mg | Freq: Once | INTRAMUSCULAR | Status: AC
  Administered 2013-12-09: 20 mg via INTRAVENOUS

## 2013-12-09 MED ORDER — DEXAMETHASONE SODIUM PHOSPHATE 20 MG/5ML IJ SOLN
INTRAMUSCULAR | Status: AC
  Filled 2013-12-09: qty 5

## 2013-12-09 MED ORDER — CARBOPLATIN CHEMO INTRADERMAL TEST DOSE 100MCG/0.02ML
100.0000 ug | Freq: Once | INTRADERMAL | Status: AC
  Administered 2013-12-09: 100 ug via INTRADERMAL
  Filled 2013-12-09: qty 0.01

## 2013-12-09 MED ORDER — CARBOPLATIN CHEMO INJECTION 450 MG/45ML
197.4000 mg | Freq: Once | INTRAVENOUS | Status: AC
  Administered 2013-12-09: 200 mg via INTRAVENOUS
  Filled 2013-12-09: qty 20

## 2013-12-09 MED ORDER — SODIUM CHLORIDE 0.9 % IJ SOLN
10.0000 mL | INTRAMUSCULAR | Status: DC | PRN
  Administered 2013-12-09: 10 mL
  Filled 2013-12-09: qty 10

## 2013-12-09 MED ORDER — HEPARIN SOD (PORK) LOCK FLUSH 100 UNIT/ML IV SOLN
500.0000 [IU] | Freq: Once | INTRAVENOUS | Status: AC | PRN
  Administered 2013-12-09: 500 [IU]
  Filled 2013-12-09: qty 5

## 2013-12-09 MED ORDER — SODIUM CHLORIDE 0.9 % IV SOLN
Freq: Once | INTRAVENOUS | Status: AC
  Administered 2013-12-09: 13:00:00 via INTRAVENOUS

## 2013-12-09 MED ORDER — SODIUM CHLORIDE 0.9 % IV SOLN
600.0000 mg/m2 | Freq: Once | INTRAVENOUS | Status: AC
  Administered 2013-12-09: 1120 mg via INTRAVENOUS
  Filled 2013-12-09: qty 56

## 2013-12-09 NOTE — Progress Notes (Addendum)
ID: Regina West   DOB: 16-Jun-1929  MR#: [id number]  RJJ#:[id number]  PCP: [name], MD GYN: SU: Stephan Minister, MD OTHER MD: Larey Seat, MD  CHIEF COMPLAINT:  Recurrent ovarian cancer TREATMENT: under active chemotherapy  HISTORY OF PRESENT ILLNESS: From the original intake nodes:  The patient originally presented in the summer of 2010 with cramps and abdominal distension.  I do not have a copy of the initial evaluation, but on November 26, 2008, the patient underwent optimal debulking with bilateral salpingo-oophorectomy, omentectomy, and the placement of an intraperitoneal port.  The pathology from that procedure (Accession Number [id number] at [facility]) showed first of all, significant involvement of the omentum, minimal involvement of the right ovary and right fallopian tube and negative on the left ovary and fallopian tube, with neither ovary being enlarged, consistent with a primary peritoneal serous adenocarcinoma, described as moderately differentiated.  The sample included no lymph nodes.    The patient had an intraperitoneal port placed in the same surgery and was treated with intraperitoneal and IV chemotherapy according to GOG-252 but I am not sure which arm she was in.  (Arm 2 did carboplatin intraperitoneally and Taxol IV.  Arm 3 did cisplatin and paclitaxel intraperitoneally with paclitaxel IV.)  All arms received bevacizumab.  Unfortunately, after 4 cycles of treatment, she had acute mental status changes and was admitted here January of 2011 with what proved to be posterior reversible leukoencephalopathy and SIADH.  She had seizures, aphasia, and required intubation.  Once the patient recovered from this, she was treated with 2 cycles of single-agent carboplatin (I do not have the AUC). Her last adjuvant treatment was May 12, 2009, and her CA-125 at that time was 10.3.  Her intraperitoneal port was removed in April of 2011.    More recently, the patient was in routine  follow up when her CA-125 was found to jump up to 2,269.7 (March 26, 2010).  This is 10 months after her last prior chemotherapy.  She had CTs of the chest, abdomen and pelvis January 26th which showed ascites and enhancing peritoneal nodularity. She had had similar findings at presentation but these had completely resolved by the time she finished treatment in March of 2011. The patient was felt to be in first relapse.   Her subsequent history is as detailed below  INTERVAL HISTORY:  Regina West returns today for follow up of her ovarian cancer, accompanied by her daughter-in-law Regina West. Today is day 1, cycle 8 of cyclophosphamide and carboplatin, infused every 21 days. She has tolerated chemo remarkably well, with fatigue as her main complaint. She is routinely anemic, and receives aranesp every 2 weeks as needed. She has had 1 blood transfusion since treatment started about 2 months ago. She plans to visit her grandson next week during his fall break.   REVIEW OF SYSTEMS:  Regina West denies fevers, chills, nausea, vomiting, or changes in bowel or bladder habits. She has some mild neuropathy to the tips of her fingers, but this is not new and is unchanged from previous documentation. She uses a cane or furniture to ambulate around objects in the house, but is not using one today. She has some shortness of breath with exertion, but she denies chest pain, palpitations, or cough. A detailed review of systems is otherwise noncontributory.   PAST MEDICAL HISTORY: Significant for hysterectomy at the age of 55 with concurrent bladder "tuck up."  She also underwent appendectomy during that procedure.  She is status post prior bilateral cataract surgery.  She is status post tonsillectomy and adenoidectomy.  She has a history of hypertension and diabetes. She has a history of diverticulosis.  She has a large hiatal hernia.  She has degenerative disk disease.  She has been noted to have coronary calcifications and she has  hyperlipidemia.  There is a history of remote tobacco abuse. Seizure disorder as per HPI  FAMILY HISTORY The patient's father died at the age of 13 after heart surgery for aortic stenosis.  The patient's mother died at the age of 42.  The patient has one brother in good health.  There is no other family history to her knowledge of ovarian or breast cancer.    GYNECOLOGIC HISTORY: Menarche at age 54.  She is GX P3 with menopause in her late 17s.  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 She is currently residing in [place]. Her husband was in sales and had a history of Parkinson's disease.  He died following a fall in the year 2000.  The patient's son Regina West lives in [place], and has a history of MS.  Daughter Regina West lives in [place]. She is a Agricultural engineer.  Son Regina West lives in [place], [place] and is in Press photographer. The patient has 8 grandchildren. She attends the [employer].  She is a good friend of our patients Regina West DIRECTIVES: in place  HEALTH MAINTENANCE:  (Updated December 2014) History  Substance Use Topics  . Smoking status: Former Smoker    Quit date: 05/23/1964  . Smokeless tobacco: Never Used  . Alcohol Use: 0.0 oz/week     Comment: rarely     Colonoscopy: 2008  PAP: UTD  MM:  Declines   Allergies  Allergen Reactions  . Avastin [Bevacizumab] Other (See Comments)    Swelling of the brain     Current Outpatient Prescriptions  Medication Sig Dispense Refill  . acyclovir (ZOVIRAX) 400 MG tablet Take 1 tablet (400 mg total) by mouth 2 (two) times daily.  60 tablet  12  . cholecalciferol (VITAMIN D) 1000 UNITS tablet Take 1,000 Units by mouth 2 (two) times daily.      Marland Kitchen dexamethasone (DECADRON) 4 MG tablet Take 2 tablets (8 mg total) by mouth 2 (two) times daily with a meal. Start the day after  chemotherapy for 3 days.  30 tablet  1  . levETIRAcetam (KEPPRA) 250 MG tablet Take 1 tablet (250 mg total) by mouth 2 (two) times daily.  60 tablet  11  . lidocaine-prilocaine (EMLA) cream Apply 1 application topically as needed.  30 g  0  . nitrofurantoin (MACRODANTIN) 50 MG capsule TAKE TWO CAPSULES BY MOUTH ONCE DAILY  60 capsule  1  . omeprazole (PRILOSEC) 40 MG capsule Take 1 capsule (40 mg total) by mouth at bedtime.  30 capsule  2  . ondansetron (ZOFRAN) 8 MG tablet Take 1 tablet (8 mg total) by mouth 2 (two) times daily. Start the day after chemo for 3 days. Then take as needed for nausea or vomiting.  30 tablet  1  . VIIBRYD 40 MG TABS       . antiseptic oral rinse (BIOTENE) LIQD 15 mLs by Mouth Rinse route 2 (two) times daily.  1 Bottle  0  . prochlorperazine (COMPAZINE) 10 MG tablet Take 1 tablet (10 mg total) by mouth every 6 (  six) hours as needed (Nausea or vomiting).  30 tablet  1   No current facility-administered medications for this visit.   Facility-Administered Medications Ordered in Other Visits  Medication Dose Route Frequency Provider Last Rate Last Dose  . sodium chloride 0.9 % injection 10 mL  10 mL Intracatheter PRN Chauncey Cruel, MD   10 mL at 10/25/13 1608    OBJECTIVE: Regina West Vitals:   12/09/13 1202  BP: 131/59  Pulse: 92  Temp: 98.4 F (36.9 West)  Resp: 19     Body mass index is West.69 kg/(m^2).    ECOG FS: 1 Filed Weights   12/09/13 1202  Weight: 172 lb 6.4 oz (78.2 kg)   Skin: warm, dry  HEENT: sclerae anicteric, conjunctivae pink, oropharynx clear. No thrush or mucositis.  Lymph Nodes: No cervical or supraclavicular lymphadenopathy  Lungs: clear to auscultation bilaterally, no rales, wheezes, or rhonci  Heart: regular rate and rhythm  Abdomen: round, soft, non tender, positive bowel sounds  Musculoskeletal: No focal spinal tenderness, no peripheral edema  Neuro: non focal, well oriented, positive affect     Lab Results  Component Value Date   WBC 5.4 12/09/2013   NEUTROABS 4.6 12/09/2013   HGB 8.6* 12/09/2013   HCT 26.1* 12/09/2013   MCV 103.5* 12/09/2013   PLT 128* 12/09/2013       Chemistry      Component Value Date/Time   NA 135* 12/09/2013 1146   NA 136 10/17/2011 1118   NA 143 05/12/2011 1337   K 4.4 12/09/2013 1146   K 3.9 10/17/2011 1118   K 4.1 05/12/2011 1337   CL 100 08/20/2012 1124   CL 100 10/17/2011 1118   CL 92* 05/12/2011 1337   CO2 25 12/09/2013 1146   CO2 27 10/17/2011 1118   CO2 32 05/12/2011 1337   West 17.0 12/09/2013 1146   West 25* 10/17/2011 1118   West 19 05/12/2011 1337   CREATININE 1.4* 12/09/2013 1146   CREATININE 1.10 10/17/2011 1118   CREATININE 1.3* 05/12/2011 1337      Component Value Date/Time   CALCIUM 10.2 12/09/2013 1146   CALCIUM 9.8 10/17/2011 1118   CALCIUM 9.8 05/12/2011 1337   ALKPHOS 109 12/09/2013 1146   ALKPHOS 109 10/17/2011 1118   ALKPHOS 95* 05/12/2011 1337   AST 18 12/09/2013 1146   AST 22 10/17/2011 1118   AST 40* 05/12/2011 1337   ALT 17 12/09/2013 1146   ALT 14 10/17/2011 1118   ALT West 05/12/2011 1337   BILITOT 0.33 12/09/2013 1146   BILITOT 0.4 10/17/2011 1118   BILITOT 0.60 05/12/2011 1337     Results for Regina West, Regina West    Ref. Range 05/31/2013 12:10 06/21/2013 12:56 07/08/2013 09:33 08/05/2013 11:26 09/02/2013 10:38  CA 125 Latest Range: 0.0-30.2 U/mL 115.6 (H) 583.6 (H) 184.4 (H) 30.8 (H) 17.1   STUDIES: No results found.  ASSESSMENT: 80 y.o.  Regina West   (1) status post optimal debulking of a primary peritoneal serous adenocarcinoma September 2010, the tumor being moderately differentiated, pT3c NX (stage IIIC), treated according to GOG 252 with  intraperitoneal platinum and paclitaxel x4 given along with bevacizumab, complicated by SIADH after cycle 4, also with posterior reversible leukoencephalopathy.  After these problems resolved she received 2 additional cycles of single-agent carboplatin completed in March 2011  (2)  She had her first  recurrence February 2012  treated with carboplatin and Gemzar for a total of 6 cycles between  February and June 2012.  Her CEA-125 had normalized before the beginning of cycle 5.   (3) second recurrence documented February 2013,  treated with carboplatin/ doxil x 6, completed 10/03/2011, with a good response noted on restaging scans  (4) third recurrence documented February 2014, receiving single-agent carboplatin every 3 weeks, first dose 04/30/2012, completed 08/20/2012  (5) fourth recurrence documented by abdominal symptoms and a rising CA 125 November 2014, receiving Abraxane, initially on days one and 8 of each 21 day cycle, with first dose given on 01/16/2013; starting 04/22/2013 she received the Abraxane every 2 weeks--rising CA 125 noted 05/31/2013  (6) started cyclophosphamide/ with "low dose" carboplatin 06/17/2013,  repeated every 21 days x8 doses, completed 12/09/13  PLAN:  Regina West is doing well today, besides her fatigue. The labs were reviewed in detail, and her hemoglobin is down to 8.6 today. Given that she is symptomatic, we will transfuse her with 2 units PRBCs sometime within in the next week as she travels with her grandson next week. Although her CEA 125 has not yet returned, the previous values remain stable. She will proceed with her final dose of carboplatin and cyclophosphamide today.   Regina West will have repeat scans performed next week. If those demonstrate a favorable response, as they have in the past, Regina West will take a break from treatment. She will continue her aranesp injections every 2 weeks with labs, but will not return to clinic until early January.   Regina West understands and agrees with the plan. She knows the goal of treatment in her case is control. She has been encouraged to call with any issues that might arise before her next visit here.   Regina Duster, Regina West  ADDENDUM: Regina West will complete 8 cycles of cyclophosphamide and carboplatin today. Her CA 125 has  been in the normal range since June. I don't think continuing at this point is going to get as much in terms of lengthening her response, and with the holidays coming up I think a 3 month break is called for. She is scheduled for repeat CT which is going to serve as our new baseline.  Accordingly she will see me again in January. We will continue the Aranesp every 2 weeks and we are setting her up tentatively for possible transfusion next week. We will follow the CA 125. Regina West has been reluctant to accept treatment, but she has done very well with these agents, which she has tolerated without significant side effects and have produced a good response. We will have to address the issue of whether or not she wants any further treatment next of her CA 125 rises.  I personally saw this patient and performed a substantive portion of this encounter with the listed APP documented above.   Chauncey Cruel, MD

## 2013-12-09 NOTE — Telephone Encounter (Signed)
Per staff message and POF I have scheduled appts. Advised scheduler of appts. JMW  

## 2013-12-09 NOTE — Patient Instructions (Signed)
[facility] Discharge Instructions for Patients Receiving Chemotherapy  Today you received the following chemotherapy agents Cytoxan, carboplatin  To help prevent nausea and vomiting after your treatment, we encourage you to take your nausea medication as needed If you develop nausea and vomiting that is not controlled by your nausea medication, call the clinic.   BELOW ARE SYMPTOMS THAT SHOULD BE REPORTED IMMEDIATELY:  *FEVER GREATER THAN 100.5 F  *CHILLS WITH OR WITHOUT FEVER  NAUSEA AND VOMITING THAT IS NOT CONTROLLED WITH YOUR NAUSEA MEDICATION  *UNUSUAL SHORTNESS OF BREATH  *UNUSUAL BRUISING OR BLEEDING  TENDERNESS IN MOUTH AND THROAT WITH OR WITHOUT PRESENCE OF ULCERS  *URINARY PROBLEMS  *BOWEL PROBLEMS  UNUSUAL RASH Items with * indicate a potential emergency and should be followed up as soon as possible.  Feel free to call the clinic you have any questions or concerns. The clinic phone number is [phone].

## 2013-12-09 NOTE — Progress Notes (Signed)
Carbo test dose given at 1335. Site negative at 1335, negative at 1345, negative at 1400. No redness at site

## 2013-12-09 NOTE — Telephone Encounter (Signed)
, °

## 2013-12-10 LAB — CA 125: CA 125: 13 U/mL (ref <35)

## 2013-12-13 ENCOUNTER — Ambulatory Visit (HOSPITAL_COMMUNITY)
Admission: RE | Admit: 2013-12-13 | Discharge: 2013-12-13 | Disposition: A | Discharge status: Home / Self Care | Payer: Medicare Other | Source: Ambulatory Visit | Attending: Oncology | Admitting: Oncology

## 2013-12-13 ENCOUNTER — Ambulatory Visit (HOSPITAL_BASED_OUTPATIENT_CLINIC_OR_DEPARTMENT_OTHER): Payer: Medicare Other

## 2013-12-13 ENCOUNTER — Other Ambulatory Visit (HOSPITAL_BASED_OUTPATIENT_CLINIC_OR_DEPARTMENT_OTHER): Payer: Medicare Other

## 2013-12-13 VITALS — BP 157/75 | HR 75 | Temp 98.5°F | Resp 16

## 2013-12-13 DIAGNOSIS — T451X5A Adverse effect of antineoplastic and immunosuppressive drugs, initial encounter: Secondary | ICD-10-CM | POA: Insufficient documentation

## 2013-12-13 DIAGNOSIS — D61810 Antineoplastic chemotherapy induced pancytopenia: Secondary | ICD-10-CM | POA: Insufficient documentation

## 2013-12-13 DIAGNOSIS — R5383 Other fatigue: Secondary | ICD-10-CM

## 2013-12-13 DIAGNOSIS — C482 Malignant neoplasm of peritoneum, unspecified: Secondary | ICD-10-CM | POA: Diagnosis not present

## 2013-12-13 DIAGNOSIS — D630 Anemia in neoplastic disease: Secondary | ICD-10-CM

## 2013-12-13 DIAGNOSIS — D649 Anemia, unspecified: Secondary | ICD-10-CM

## 2013-12-13 LAB — CBC WITH DIFFERENTIAL/PLATELET
BASO%: 0 % (ref 0.0–2.0)
Basophils Absolute: 0 10*3/uL (ref 0.0–0.1)
EOS%: 0 % (ref 0.0–7.0)
Eosinophils Absolute: 0 10*3/uL (ref 0.0–0.5)
HCT: 23.5 % — ABNORMAL LOW (ref 34.8–46.6)
HGB: 7.8 g/dL — ABNORMAL LOW (ref 11.6–15.9)
LYMPH%: 1.9 % — AB (ref 14.0–49.7)
MCH: 34.7 pg — ABNORMAL HIGH (ref 25.1–34.0)
MCHC: 33.2 g/dL (ref 31.5–36.0)
MCV: 104.3 fL — AB (ref 79.5–101.0)
MONO#: 0.2 10*3/uL (ref 0.1–0.9)
MONO%: 3.7 % (ref 0.0–14.0)
NEUT#: 3.8 10*3/uL (ref 1.5–6.5)
NEUT%: 94.4 % — ABNORMAL HIGH (ref 38.4–76.8)
PLATELETS: 152 10*3/uL (ref 145–400)
RBC: 2.26 10*6/uL — AB (ref 3.70–5.45)
RDW: 23.3 % — AB (ref 11.2–14.5)
WBC: 4.1 10*3/uL (ref 3.9–10.3)
lymph#: 0.1 10*3/uL — ABNORMAL LOW (ref 0.9–3.3)

## 2013-12-13 LAB — COMPREHENSIVE METABOLIC PANEL (CC13)
ALK PHOS: 87 U/L (ref 40–150)
ALT: 21 U/L (ref 0–55)
AST: 20 U/L (ref 5–34)
Albumin: 3.4 g/dL — ABNORMAL LOW (ref 3.5–5.0)
Anion Gap: 9 mEq/L (ref 3–11)
BUN: 33.6 mg/dL — AB (ref 7.0–26.0)
CALCIUM: 9.4 mg/dL (ref 8.4–10.4)
CO2: 25 mEq/L (ref 22–29)
CREATININE: 1.3 mg/dL — AB (ref 0.6–1.1)
Chloride: 102 mEq/L (ref 98–109)
GLUCOSE: 388 mg/dL — AB (ref 70–140)
POTASSIUM: 4.6 meq/L (ref 3.5–5.1)
Sodium: 135 mEq/L — ABNORMAL LOW (ref 136–145)
Total Bilirubin: 0.61 mg/dL (ref 0.20–1.20)
Total Protein: 6 g/dL — ABNORMAL LOW (ref 6.4–8.3)

## 2013-12-13 LAB — PREPARE RBC (CROSSMATCH)

## 2013-12-13 MED ORDER — ACETAMINOPHEN 325 MG PO TABS
650.0000 mg | ORAL_TABLET | Freq: Once | ORAL | Status: AC
  Administered 2013-12-13: 650 mg via ORAL

## 2013-12-13 MED ORDER — ACETAMINOPHEN 325 MG PO TABS
ORAL_TABLET | ORAL | Status: AC
  Filled 2013-12-13: qty 2

## 2013-12-13 MED ORDER — DIPHENHYDRAMINE HCL 25 MG PO CAPS
25.0000 mg | ORAL_CAPSULE | Freq: Once | ORAL | Status: AC
  Administered 2013-12-13: 25 mg via ORAL

## 2013-12-13 MED ORDER — SODIUM CHLORIDE 0.9 % IV SOLN
250.0000 mL | Freq: Once | INTRAVENOUS | Status: AC
  Administered 2013-12-13: 250 mL via INTRAVENOUS

## 2013-12-13 MED ORDER — DIPHENHYDRAMINE HCL 25 MG PO CAPS
ORAL_CAPSULE | ORAL | Status: AC
  Filled 2013-12-13: qty 2

## 2013-12-13 MED ORDER — SODIUM CHLORIDE 0.9 % IJ SOLN
10.0000 mL | INTRAMUSCULAR | Status: AC | PRN
  Administered 2013-12-13: 10 mL
  Filled 2013-12-13: qty 10

## 2013-12-13 MED ORDER — HEPARIN SOD (PORK) LOCK FLUSH 100 UNIT/ML IV SOLN
500.0000 [IU] | Freq: Every day | INTRAVENOUS | Status: AC | PRN
  Administered 2013-12-13: 500 [IU]
  Filled 2013-12-13: qty 5

## 2013-12-13 MED ORDER — SODIUM CHLORIDE 0.9 % IJ SOLN
3.0000 mL | INTRAMUSCULAR | Status: AC | PRN
  Filled 2013-12-13: qty 10

## 2013-12-14 LAB — TYPE AND SCREEN
ABO/RH(D): A POS
ANTIBODY SCREEN: NEGATIVE
UNIT DIVISION: 0
UNIT DIVISION: 0

## 2013-12-16 ENCOUNTER — Encounter (HOSPITAL_COMMUNITY): Payer: Self-pay

## 2013-12-16 ENCOUNTER — Ambulatory Visit (HOSPITAL_COMMUNITY)
Admission: RE | Admit: 2013-12-16 | Discharge: 2013-12-16 | Disposition: A | Discharge status: Home / Self Care | Payer: Medicare Other | Source: Ambulatory Visit | Attending: Nurse Practitioner | Admitting: Nurse Practitioner

## 2013-12-16 ENCOUNTER — Ambulatory Visit (HOSPITAL_BASED_OUTPATIENT_CLINIC_OR_DEPARTMENT_OTHER): Payer: Medicare Other

## 2013-12-16 ENCOUNTER — Ambulatory Visit: Payer: Medicare Other

## 2013-12-16 ENCOUNTER — Other Ambulatory Visit: Payer: Self-pay | Admitting: *Deleted

## 2013-12-16 VITALS — BP 131/66 | HR 85 | Temp 98.4°F

## 2013-12-16 DIAGNOSIS — Z79899 Other long term (current) drug therapy: Secondary | ICD-10-CM | POA: Diagnosis not present

## 2013-12-16 DIAGNOSIS — Z9889 Other specified postprocedural states: Secondary | ICD-10-CM | POA: Diagnosis not present

## 2013-12-16 DIAGNOSIS — C482 Malignant neoplasm of peritoneum, unspecified: Secondary | ICD-10-CM | POA: Diagnosis not present

## 2013-12-16 DIAGNOSIS — D6481 Anemia due to antineoplastic chemotherapy: Secondary | ICD-10-CM

## 2013-12-16 DIAGNOSIS — D649 Anemia, unspecified: Secondary | ICD-10-CM

## 2013-12-16 MED ORDER — DARBEPOETIN ALFA-POLYSORBATE 200 MCG/0.4ML IJ SOLN
200.0000 ug | Freq: Once | INTRAMUSCULAR | Status: AC
Start: 2013-12-16 — End: 2013-12-16
  Administered 2013-12-16: 200 ug via SUBCUTANEOUS
  Filled 2013-12-16: qty 0.4

## 2013-12-16 MED ORDER — IOHEXOL 300 MG/ML  SOLN
80.0000 mL | Freq: Once | INTRAMUSCULAR | Status: AC | PRN
  Administered 2013-12-16: 80 mL via INTRAVENOUS

## 2013-12-17 LAB — PREPARE RBC (CROSSMATCH)

## 2013-12-28 ENCOUNTER — Other Ambulatory Visit: Payer: Self-pay | Admitting: Oncology

## 2013-12-30 ENCOUNTER — Ambulatory Visit (HOSPITAL_BASED_OUTPATIENT_CLINIC_OR_DEPARTMENT_OTHER): Payer: Medicare Other | Admitting: Oncology

## 2013-12-30 ENCOUNTER — Other Ambulatory Visit: Payer: Self-pay | Admitting: Nurse Practitioner

## 2013-12-30 ENCOUNTER — Ambulatory Visit: Payer: Medicare Other

## 2013-12-30 ENCOUNTER — Other Ambulatory Visit (HOSPITAL_BASED_OUTPATIENT_CLINIC_OR_DEPARTMENT_OTHER): Payer: Medicare Other

## 2013-12-30 DIAGNOSIS — T451X5A Adverse effect of antineoplastic and immunosuppressive drugs, initial encounter: Secondary | ICD-10-CM

## 2013-12-30 DIAGNOSIS — C482 Malignant neoplasm of peritoneum, unspecified: Secondary | ICD-10-CM

## 2013-12-30 DIAGNOSIS — D649 Anemia, unspecified: Secondary | ICD-10-CM

## 2013-12-30 DIAGNOSIS — D61810 Antineoplastic chemotherapy induced pancytopenia: Secondary | ICD-10-CM

## 2013-12-30 DIAGNOSIS — R5383 Other fatigue: Secondary | ICD-10-CM

## 2013-12-30 LAB — CBC WITH DIFFERENTIAL/PLATELET
BASO%: 0.2 % (ref 0.0–2.0)
Basophils Absolute: 0 10*3/uL (ref 0.0–0.1)
EOS%: 0.1 % (ref 0.0–7.0)
Eosinophils Absolute: 0 10*3/uL (ref 0.0–0.5)
HCT: 32.4 % — ABNORMAL LOW (ref 34.8–46.6)
HEMOGLOBIN: 10.6 g/dL — AB (ref 11.6–15.9)
LYMPH#: 0.2 10*3/uL — AB (ref 0.9–3.3)
LYMPH%: 7 % — ABNORMAL LOW (ref 14.0–49.7)
MCH: 33.4 pg (ref 25.1–34.0)
MCHC: 32.6 g/dL (ref 31.5–36.0)
MCV: 102.3 fL — ABNORMAL HIGH (ref 79.5–101.0)
MONO#: 0.4 10*3/uL (ref 0.1–0.9)
MONO%: 12.3 % (ref 0.0–14.0)
NEUT#: 2.8 10*3/uL (ref 1.5–6.5)
NEUT%: 80.4 % — ABNORMAL HIGH (ref 38.4–76.8)
Platelets: 84 10*3/uL — ABNORMAL LOW (ref 145–400)
RBC: 3.17 10*6/uL — ABNORMAL LOW (ref 3.70–5.45)
RDW: 22.4 % — AB (ref 11.2–14.5)
WBC: 3.5 10*3/uL — AB (ref 3.9–10.3)

## 2013-12-30 LAB — COMPREHENSIVE METABOLIC PANEL (CC13)
ALBUMIN: 3.5 g/dL (ref 3.5–5.0)
ALT: 22 U/L (ref 0–55)
AST: 22 U/L (ref 5–34)
Alkaline Phosphatase: 90 U/L (ref 40–150)
Anion Gap: 11 mEq/L (ref 3–11)
BUN: 20.2 mg/dL (ref 7.0–26.0)
CALCIUM: 9.8 mg/dL (ref 8.4–10.4)
CHLORIDE: 105 meq/L (ref 98–109)
CO2: 24 mEq/L (ref 22–29)
Creatinine: 1.2 mg/dL — ABNORMAL HIGH (ref 0.6–1.1)
Glucose: 250 mg/dl — ABNORMAL HIGH (ref 70–140)
POTASSIUM: 3.8 meq/L (ref 3.5–5.1)
Sodium: 140 mEq/L (ref 136–145)
Total Bilirubin: 0.51 mg/dL (ref 0.20–1.20)
Total Protein: 6.1 g/dL — ABNORMAL LOW (ref 6.4–8.3)

## 2013-12-30 LAB — TECHNOLOGIST REVIEW

## 2013-12-31 DIAGNOSIS — R5081 Fever presenting with conditions classified elsewhere: Secondary | ICD-10-CM

## 2013-12-31 DIAGNOSIS — D709 Neutropenia, unspecified: Secondary | ICD-10-CM | POA: Insufficient documentation

## 2013-12-31 LAB — CA 125: CA 125: 16 U/mL (ref <35)

## 2013-12-31 NOTE — Progress Notes (Signed)
ID: Regina West   DOB: 04-14-1929  MR#: [id number]  MVH#:[id number]  PCP: [name], MD GYN: SU: Stephan Minister, MD OTHER MD: Larey Seat, MD  CHIEF COMPLAINT:  Recurrent ovarian cancer TREATMENT: observation  HISTORY OF PRESENT ILLNESS: From the original intake nodes:  The patient originally presented in the summer of 2010 with cramps and abdominal distension.  I do not have a copy of the initial evaluation, but on November 26, 2008, the patient underwent optimal debulking with bilateral salpingo-oophorectomy, omentectomy, and the placement of an intraperitoneal port.  The pathology from that procedure (Accession Number [id number] at [facility]) showed first of all, significant involvement of the omentum, minimal involvement of the right ovary and right fallopian tube and negative on the left ovary and fallopian tube, with neither ovary being enlarged, consistent with a primary peritoneal serous adenocarcinoma, described as moderately differentiated.  The sample included no lymph nodes.    The patient had an intraperitoneal port placed in the same surgery and was treated with intraperitoneal and IV chemotherapy according to GOG-252 but I am not sure which arm she was in.  (Arm 2 did carboplatin intraperitoneally and Taxol IV.  Arm 3 did cisplatin and paclitaxel intraperitoneally with paclitaxel IV.)  All arms received bevacizumab.  Unfortunately, after 4 cycles of treatment, she had acute mental status changes and was admitted here January of 2011 with what proved to be posterior reversible leukoencephalopathy and SIADH.  She had seizures, aphasia, and required intubation.  Once the patient recovered from this, she was treated with 2 cycles of single-agent carboplatin (I do not have the AUC). Her last adjuvant treatment was May 12, 2009, and her CA-125 at that time was 10.3.  Her intraperitoneal port was removed in April of 2011.    More recently, the patient was in routine follow up when her  CA-125 was found to jump up to 2,269.7 (March 26, 2010).  This is 10 months after her last prior chemotherapy.  She had CTs of the chest, abdomen and pelvis January 26th which showed ascites and enhancing peritoneal nodularity. She had had similar findings at presentation but these had completely resolved by the time she finished treatment in March of 2011. The patient was felt to be in first relapse.   Her subsequent history is as detailed below  INTERVAL HISTORY:  Regina West returns today for follow up of her ovarian cancer, accompanied by her daughter-in-law Regina West. After her last visit here, Regina West visited family in [place]. While there she developed a fever or seizure. It is very difficult to tell from her description. She had been off her medications for 48 hours. It seems more like rigors than a seizure from her description. She went to the emergency room where she was found to be febrile. They resumed her medications and started her on antibiotics. She returned home uneventfully.  REVIEW OF SYSTEMS:  Regina West has very mixed memories of the events in [place]. Now that she is back home she is a little bit better oriented. She tells me she is feeling fine, having no headaches, no visual changes, no nausea or vomiting, and no weakness or gait imbalance. She denies cough, phlegm production or pleurisy. She is having normal bowel movements. She denies any change in bladder habits. Her fever has completely resolved. A detailed review of systems today was otherwise noncontributory  PAST MEDICAL HISTORY: Significant for hysterectomy at the age of 39 with concurrent bladder "tuck up."  She also underwent appendectomy during that procedure.  She is status post prior bilateral cataract surgery. She is status post tonsillectomy and adenoidectomy.  She has a history of hypertension and diabetes. She has a history of diverticulosis.  She has a large hiatal hernia.  She has degenerative disk disease.  She has been  noted to have coronary calcifications and she has hyperlipidemia.  There is a history of remote tobacco abuse. Seizure disorder as per HPI  FAMILY HISTORY The patient's father died at the age of 105 after heart surgery for aortic stenosis.  The patient's mother died at the age of 77.  The patient has one brother in good health.  There is no other family history to her knowledge of ovarian or breast cancer.    GYNECOLOGIC HISTORY: Menarche at age 26.  She is GX P3 with menopause in her late 51s.  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 She is currently residing in [place]. Her husband was in sales and had a history of Parkinson's disease.  He died following a fall in the year 2000.  The patient's son Regina West lives in [place], and has a history of MS.  Daughter Regina West lives in [place]. She is a Agricultural engineer.  Son Regina West lives in [place], [place] and is in Press photographer. The patient has 8 grandchildren. She attends the [employer].  She is a good friend of our patients J. and [place] DIRECTIVES: in place  HEALTH MAINTENANCE:  (Updated December 2014) History  Substance Use Topics  . Smoking status: Former Smoker    Quit date: 05/23/1964  . Smokeless tobacco: Never Used  . Alcohol Use: 0.0 oz/week     Comment: rarely     Colonoscopy: 2008  PAP: UTD  MM:  Declines   Allergies  Allergen Reactions  . Avastin [Bevacizumab] Other (See Comments)    Swelling of the brain     Current Outpatient Prescriptions  Medication Sig Dispense Refill  . acyclovir (ZOVIRAX) 400 MG tablet Take 1 tablet (400 mg total) by mouth 2 (two) times daily.  60 tablet  12  . antiseptic oral rinse (BIOTENE) LIQD 15 mLs by Mouth Rinse route 2 (two) times daily.  1 Bottle  0  . cholecalciferol (VITAMIN D) 1000 UNITS tablet Take 1,000 Units by mouth 2 (two)  times daily.      Marland Kitchen dexamethasone (DECADRON) 4 MG tablet Take 2 tablets (8 mg total) by mouth 2 (two) times daily with a meal. Start the day after chemotherapy for 3 days.  30 tablet  1  . levETIRAcetam (KEPPRA) 250 MG tablet Take 1 tablet (250 mg total) by mouth 2 (two) times daily.  60 tablet  11  . lidocaine-prilocaine (EMLA) cream Apply 1 application topically as needed.  30 g  0  . nitrofurantoin (MACRODANTIN) 50 MG capsule TAKE TWO CAPSULES BY MOUTH ONCE DAILY  60 capsule  1  . omeprazole (PRILOSEC) 40 MG capsule Take 1 capsule (40 mg total) by mouth at bedtime.  30 capsule  2  . ondansetron (ZOFRAN) 8 MG tablet Take 1 tablet (8 mg total) by mouth 2 (two) times daily. Start the day after chemo for 3 days. Then take as needed for nausea or vomiting.  30 tablet  1  . prochlorperazine (COMPAZINE) 10 MG tablet Take 1 tablet (10 mg total) by mouth every 6 (six) hours as  needed (Nausea or vomiting).  30 tablet  1  . VIIBRYD 40 MG TABS        No current facility-administered medications for this visit.   Facility-Administered Medications Ordered in Other Visits  Medication Dose Route Frequency Provider Last Rate Last Dose  . sodium chloride 0.9 % injection 10 mL  10 mL Intracatheter PRN Chauncey Cruel, MD   10 mL at 10/25/13 1608  . sodium chloride 0.9 % injection 3 mL  3 mL Intracatheter PRN Chauncey Cruel, MD        OBJECTIVE: Elderly white woman in no acute distress There were no vitals filed for this visit.   There is no weight on file to calculate BMI.    ECOG FS: 1 There were no vitals filed for this visit.  Sclerae unicteric, pupils round and equal, EOMs intact Oropharynx clear, dentition in good repair No cervical or supraclavicular adenopathy Lungs no rales or rhonchi Heart regular rate and rhythm, no murmur appreciated Abd soft, nontender, positive bowel sounds, no masses palpated MSK no focal spinal tenderness, no lower extremity lymphedema Neuro: nonfocal, generally well  oriented but a bit vague on recent memory questions, appropriate affect Breasts: Deferred    Lab Results  Component Value Date   WBC 3.5* 12/30/2013   NEUTROABS 2.8 12/30/2013   HGB 10.6* 12/30/2013   HCT 32.4* 12/30/2013   MCV 102.3* 12/30/2013   PLT 84* 12/30/2013       Chemistry      Component Value Date/Time   NA 140 12/30/2013 1123   NA 136 10/17/2011 1118   NA 143 05/12/2011 1337   K 3.8 12/30/2013 1123   K 3.9 10/17/2011 1118   K 4.1 05/12/2011 1337   CL 100 08/20/2012 1124   CL 100 10/17/2011 1118   CL 92* 05/12/2011 1337   CO2 24 12/30/2013 1123   CO2 27 10/17/2011 1118   CO2 32 05/12/2011 1337   West 20.2 12/30/2013 1123   West 25* 10/17/2011 1118   West 19 05/12/2011 1337   CREATININE 1.2* 12/30/2013 1123   CREATININE 1.10 10/17/2011 1118   CREATININE 1.3* 05/12/2011 1337      Component Value Date/Time   CALCIUM 9.8 12/30/2013 1123   CALCIUM 9.8 10/17/2011 1118   CALCIUM 9.8 05/12/2011 1337   ALKPHOS 90 12/30/2013 1123   ALKPHOS 109 10/17/2011 1118   ALKPHOS 95* 05/12/2011 1337   AST 22 12/30/2013 1123   AST 22 10/17/2011 1118   AST 40* 05/12/2011 1337   ALT 22 12/30/2013 1123   ALT 14 10/17/2011 1118   ALT 28 05/12/2011 1337   BILITOT 0.51 12/30/2013 1123   BILITOT 0.4 10/17/2011 1118   BILITOT 0.60 05/12/2011 1337     Results for ZANETTA, DEHAAN (MRN [id number]) as of 12/31/2013 11:26  Ref. Range 10/28/2013 10:19 11/18/2013 15:31 11/18/2013 15:31 12/09/2013 11:45 12/30/2013 11:23  CA 125 Latest Range: <35 U/mL 14.2 15 11.2 13 16    STUDIES: Ct Abdomen Pelvis W Contrast  12/16/2013   CLINICAL DATA:  Recurrent ovarian cancer originally diagnosed 2010. Ongoing chemotherapy. Prior appendectomy and total abdominal hysterectomy.  EXAM: CT ABDOMEN AND PELVIS WITH CONTRAST  TECHNIQUE: Multidetector CT imaging of the abdomen and pelvis was performed using the standard protocol following bolus administration of intravenous contrast.  CONTRAST:  69mL OMNIPAQUE IOHEXOL 300 MG/ML  SOLN   COMPARISON:  09/14/2012, 04/13/2012  FINDINGS: Lower chest: Lung bases are clear. Large hiatal hernia reidentified.  Hepatobiliary: Stable 4 mm nodule  abutting the base of the right hepatic lobe image 28. Gallbladder is normal. No intra or extrahepatic ductal dilatation.  Pancreas: Normal  Spleen: Normal  Adrenals/Urinary Tract: Kidneys and adrenal glands appear normal. No hydroureteronephrosis.  Stomach/Bowel: Colonic diverticuli noted without evidence for diverticulitis. No bowel wall thickening or focal segmental dilatation. Omental thickening adjacent to ascending colon image 47 is now thin and linear without measurable nodular component, subjectively less conspicuous than previously.  Vascular/Lymphatic: Stable 5 mm mesenteric node image 31. No new retroperitoneal lymphadenopathy or peritoneal/omental nodularity.  Reproductive: Uterus and ovaries surgically absent. Trace pelvic fluid is slightly decreased from previously.  Other: None  Musculoskeletal: Multilevel disc degenerative change reidentified. No acute osseous abnormality or new lytic or sclerotic osseous lesion.  IMPRESSION: Decreased prominence of the right lower quadrant pericolonic omental thickening, compatible with further response to treatment. No new evidence for intra-abdominal or pelvic recurrence or metastatic disease.   Electronically Signed   By: Conchita Paris M.D.   On: 12/16/2013 15:43    ASSESSMENT: 80 y.o.  [place] woman   (1) status post optimal debulking of a primary peritoneal serous adenocarcinoma September 2010, the tumor being moderately differentiated, pT3c NX (stage IIIC), treated according to GOG 252 with  intraperitoneal platinum and paclitaxel x4 given along with bevacizumab, complicated by SIADH after cycle 4, also with posterior reversible leukoencephalopathy.  After these problems resolved she received 2 additional cycles of single-agent carboplatin completed in March 2011  (2)  She had her first recurrence  February 2012  treated with carboplatin and Gemzar for a total of 6 cycles between February and June 2012.  Her CEA-125 had normalized before the beginning of cycle 5.   (3) second recurrence documented February 2013,  treated with carboplatin/ doxil x 6, completed 10/03/2011, with a good response noted on restaging scans  (4) third recurrence documented February 2014, receiving single-agent carboplatin every 3 weeks, first dose 04/30/2012, completed 08/20/2012  (5) fourth recurrence documented by abdominal symptoms and a rising CA 125 November 2014, receiving Abraxane, initially on days one and 8 of each 21 day cycle, with first dose given on 01/16/2013; starting 04/22/2013 she received the Abraxane every 2 weeks--rising CA 125 noted 05/31/2013  (6) started cyclophosphamide/ with "low dose" carboplatin 06/17/2013,  repeated every 21 days x8 doses, completed 12/09/13  PLAN:  Kristene appears to have recovered from her febrile episode and she is no longer leukopenic. She understands the importance of continuing to take her medications as prescribed. Overall however we are not changing the plans. She will see me again in January and we will continue to follow this CA 125 closely. We are also continuing her Aranesp every 2 weeks. She missed at this visit because she was recently status post transfusion and her hemoglobin was about the dosing threshold.  She knows to call for any problems that may develop before her next visit here.  Chauncey Cruel, MD  Chauncey Cruel, MD

## 2014-01-03 ENCOUNTER — Other Ambulatory Visit: Payer: Self-pay | Admitting: *Deleted

## 2014-01-03 DIAGNOSIS — C482 Malignant neoplasm of peritoneum, unspecified: Secondary | ICD-10-CM

## 2014-01-03 DIAGNOSIS — K219 Gastro-esophageal reflux disease without esophagitis: Secondary | ICD-10-CM

## 2014-01-03 MED ORDER — NITROFURANTOIN MACROCRYSTAL 50 MG PO CAPS: ORAL_CAPSULE | ORAL | Status: DC

## 2014-01-03 MED ORDER — OMEPRAZOLE 40 MG PO CPDR: 40.0000 mg | DELAYED_RELEASE_CAPSULE | Freq: Every day | ORAL | Status: DC

## 2014-01-07 ENCOUNTER — Ambulatory Visit (HOSPITAL_COMMUNITY): Payer: Medicare Other

## 2014-01-13 ENCOUNTER — Ambulatory Visit: Payer: Medicare Other

## 2014-01-13 ENCOUNTER — Other Ambulatory Visit (HOSPITAL_BASED_OUTPATIENT_CLINIC_OR_DEPARTMENT_OTHER): Payer: Medicare Other

## 2014-01-13 ENCOUNTER — Other Ambulatory Visit: Payer: Self-pay | Admitting: Emergency Medicine

## 2014-01-13 ENCOUNTER — Other Ambulatory Visit: Payer: Self-pay | Admitting: Oncology

## 2014-01-13 DIAGNOSIS — D649 Anemia, unspecified: Secondary | ICD-10-CM

## 2014-01-13 DIAGNOSIS — C482 Malignant neoplasm of peritoneum, unspecified: Secondary | ICD-10-CM

## 2014-01-13 DIAGNOSIS — R300 Dysuria: Secondary | ICD-10-CM

## 2014-01-13 DIAGNOSIS — D61810 Antineoplastic chemotherapy induced pancytopenia: Secondary | ICD-10-CM

## 2014-01-13 DIAGNOSIS — R5383 Other fatigue: Secondary | ICD-10-CM

## 2014-01-13 DIAGNOSIS — T451X5A Adverse effect of antineoplastic and immunosuppressive drugs, initial encounter: Principal | ICD-10-CM

## 2014-01-13 LAB — COMPREHENSIVE METABOLIC PANEL (CC13)
ALK PHOS: 103 U/L (ref 40–150)
ALT: 23 U/L (ref 0–55)
AST: 24 U/L (ref 5–34)
Albumin: 3.7 g/dL (ref 3.5–5.0)
Anion Gap: 11 mEq/L (ref 3–11)
BILIRUBIN TOTAL: 0.47 mg/dL (ref 0.20–1.20)
BUN: 20.8 mg/dL (ref 7.0–26.0)
CO2: 24 mEq/L (ref 22–29)
Calcium: 10 mg/dL (ref 8.4–10.4)
Chloride: 105 mEq/L (ref 98–109)
Creatinine: 1.3 mg/dL — ABNORMAL HIGH (ref 0.6–1.1)
Glucose: 258 mg/dl — ABNORMAL HIGH (ref 70–140)
Potassium: 4.5 mEq/L (ref 3.5–5.1)
SODIUM: 140 meq/L (ref 136–145)
TOTAL PROTEIN: 6.4 g/dL (ref 6.4–8.3)

## 2014-01-13 LAB — CBC WITH DIFFERENTIAL/PLATELET
BASO%: 0.4 % (ref 0.0–2.0)
Basophils Absolute: 0 10*3/uL (ref 0.0–0.1)
EOS%: 1.8 % (ref 0.0–7.0)
Eosinophils Absolute: 0.1 10*3/uL (ref 0.0–0.5)
HCT: 34.7 % — ABNORMAL LOW (ref 34.8–46.6)
HGB: 11.4 g/dL — ABNORMAL LOW (ref 11.6–15.9)
LYMPH%: 10 % — ABNORMAL LOW (ref 14.0–49.7)
MCH: 35.1 pg — AB (ref 25.1–34.0)
MCHC: 32.9 g/dL (ref 31.5–36.0)
MCV: 106.5 fL — ABNORMAL HIGH (ref 79.5–101.0)
MONO#: 0.7 10*3/uL (ref 0.1–0.9)
MONO%: 19.4 % — AB (ref 0.0–14.0)
NEUT#: 2.6 10*3/uL (ref 1.5–6.5)
NEUT%: 68.4 % (ref 38.4–76.8)
Platelets: 176 10*3/uL (ref 145–400)
RBC: 3.25 10*6/uL — AB (ref 3.70–5.45)
RDW: 22.8 % — ABNORMAL HIGH (ref 11.2–14.5)
WBC: 3.8 10*3/uL — AB (ref 3.9–10.3)
lymph#: 0.4 10*3/uL — ABNORMAL LOW (ref 0.9–3.3)

## 2014-01-13 MED ORDER — DARBEPOETIN ALFA 200 MCG/0.4ML IJ SOSY: 200.0000 ug | PREFILLED_SYRINGE | Freq: Once | INTRAMUSCULAR | Status: DC

## 2014-01-13 NOTE — Progress Notes (Signed)
Hgb: 11.6. Patient aware.  No aranesp given.   

## 2014-01-14 LAB — URINE CULTURE

## 2014-01-27 ENCOUNTER — Other Ambulatory Visit: Payer: Self-pay | Admitting: *Deleted

## 2014-01-27 ENCOUNTER — Other Ambulatory Visit (HOSPITAL_BASED_OUTPATIENT_CLINIC_OR_DEPARTMENT_OTHER): Payer: Medicare Other

## 2014-01-27 ENCOUNTER — Ambulatory Visit: Payer: Medicare Other

## 2014-01-27 DIAGNOSIS — R5383 Other fatigue: Secondary | ICD-10-CM

## 2014-01-27 DIAGNOSIS — T451X5A Adverse effect of antineoplastic and immunosuppressive drugs, initial encounter: Secondary | ICD-10-CM

## 2014-01-27 DIAGNOSIS — C482 Malignant neoplasm of peritoneum, unspecified: Secondary | ICD-10-CM

## 2014-01-27 DIAGNOSIS — C569 Malignant neoplasm of unspecified ovary: Secondary | ICD-10-CM

## 2014-01-27 DIAGNOSIS — D61810 Antineoplastic chemotherapy induced pancytopenia: Secondary | ICD-10-CM

## 2014-01-27 LAB — CBC WITH DIFFERENTIAL/PLATELET
BASO%: 0.1 % (ref 0.0–2.0)
BASOS ABS: 0 10*3/uL (ref 0.0–0.1)
EOS%: 2.6 % (ref 0.0–7.0)
Eosinophils Absolute: 0.2 10*3/uL (ref 0.0–0.5)
HCT: 34.1 % — ABNORMAL LOW (ref 34.8–46.6)
HGB: 11.6 g/dL (ref 11.6–15.9)
LYMPH%: 12.3 % — ABNORMAL LOW (ref 14.0–49.7)
MCH: 35.7 pg — ABNORMAL HIGH (ref 25.1–34.0)
MCHC: 34 g/dL (ref 31.5–36.0)
MCV: 104.9 fL — AB (ref 79.5–101.0)
MONO#: 0.4 10*3/uL (ref 0.1–0.9)
MONO%: 5.7 % (ref 0.0–14.0)
NEUT%: 79.3 % — ABNORMAL HIGH (ref 38.4–76.8)
NEUTROS ABS: 5.8 10*3/uL (ref 1.5–6.5)
PLATELETS: 140 10*3/uL — AB (ref 145–400)
RBC: 3.25 10*6/uL — ABNORMAL LOW (ref 3.70–5.45)
RDW: 17.5 % — ABNORMAL HIGH (ref 11.2–14.5)
WBC: 7.3 10*3/uL (ref 3.9–10.3)
lymph#: 0.9 10*3/uL (ref 0.9–3.3)

## 2014-01-27 MED ORDER — ACYCLOVIR 400 MG PO TABS: 400.0000 mg | ORAL_TABLET | Freq: Two times a day (BID) | ORAL | Status: AC

## 2014-01-27 NOTE — Progress Notes (Signed)
Hgb: 11.6. Patient aware.  No aranesp given.   

## 2014-01-28 LAB — CA 125: CA 125: 15 U/mL (ref <35)

## 2014-02-07 ENCOUNTER — Encounter: Payer: Self-pay | Admitting: Podiatry

## 2014-02-07 ENCOUNTER — Ambulatory Visit (INDEPENDENT_AMBULATORY_CARE_PROVIDER_SITE_OTHER): Payer: Medicare Other | Admitting: Podiatry

## 2014-02-07 DIAGNOSIS — M79676 Pain in unspecified toe(s): Secondary | ICD-10-CM

## 2014-02-07 DIAGNOSIS — B351 Tinea unguium: Secondary | ICD-10-CM

## 2014-02-10 ENCOUNTER — Ambulatory Visit: Payer: Medicare Other

## 2014-02-10 ENCOUNTER — Other Ambulatory Visit (HOSPITAL_BASED_OUTPATIENT_CLINIC_OR_DEPARTMENT_OTHER): Payer: Medicare Other

## 2014-02-10 DIAGNOSIS — R5383 Other fatigue: Secondary | ICD-10-CM

## 2014-02-10 DIAGNOSIS — C482 Malignant neoplasm of peritoneum, unspecified: Secondary | ICD-10-CM

## 2014-02-10 DIAGNOSIS — T451X5A Adverse effect of antineoplastic and immunosuppressive drugs, initial encounter: Principal | ICD-10-CM

## 2014-02-10 DIAGNOSIS — D649 Anemia, unspecified: Secondary | ICD-10-CM

## 2014-02-10 DIAGNOSIS — D61810 Antineoplastic chemotherapy induced pancytopenia: Secondary | ICD-10-CM

## 2014-02-10 LAB — CBC WITH DIFFERENTIAL/PLATELET
BASO%: 0.2 % (ref 0.0–2.0)
BASOS ABS: 0 10*3/uL (ref 0.0–0.1)
EOS%: 4 % (ref 0.0–7.0)
Eosinophils Absolute: 0.2 10*3/uL (ref 0.0–0.5)
HEMATOCRIT: 31 % — AB (ref 34.8–46.6)
HGB: 10.5 g/dL — ABNORMAL LOW (ref 11.6–15.9)
LYMPH%: 12.3 % — AB (ref 14.0–49.7)
MCH: 36.5 pg — ABNORMAL HIGH (ref 25.1–34.0)
MCHC: 33.9 g/dL (ref 31.5–36.0)
MCV: 107.6 fL — AB (ref 79.5–101.0)
MONO#: 0.5 10*3/uL (ref 0.1–0.9)
MONO%: 9.4 % (ref 0.0–14.0)
NEUT%: 74.1 % (ref 38.4–76.8)
NEUTROS ABS: 4 10*3/uL (ref 1.5–6.5)
Platelets: 141 10*3/uL — ABNORMAL LOW (ref 145–400)
RBC: 2.88 10*6/uL — AB (ref 3.70–5.45)
RDW: 15.7 % — ABNORMAL HIGH (ref 11.2–14.5)
WBC: 5.4 10*3/uL (ref 3.9–10.3)
lymph#: 0.7 10*3/uL — ABNORMAL LOW (ref 0.9–3.3)

## 2014-02-10 LAB — COMPREHENSIVE METABOLIC PANEL (CC13)
ALT: 20 U/L (ref 0–55)
ANION GAP: 11 meq/L (ref 3–11)
AST: 23 U/L (ref 5–34)
Albumin: 3.6 g/dL (ref 3.5–5.0)
Alkaline Phosphatase: 114 U/L (ref 40–150)
BILIRUBIN TOTAL: 0.47 mg/dL (ref 0.20–1.20)
BUN: 17.3 mg/dL (ref 7.0–26.0)
CO2: 25 meq/L (ref 22–29)
CREATININE: 1.1 mg/dL (ref 0.6–1.1)
Calcium: 10.1 mg/dL (ref 8.4–10.4)
Chloride: 102 mEq/L (ref 98–109)
EGFR: 47 mL/min/{1.73_m2} — AB (ref >90)
GLUCOSE: 125 mg/dL (ref 70–140)
Potassium: 4.4 mEq/L (ref 3.5–5.1)
SODIUM: 138 meq/L (ref 136–145)
Total Protein: 6.3 g/dL — ABNORMAL LOW (ref 6.4–8.3)

## 2014-02-10 MED ORDER — DARBEPOETIN ALFA 200 MCG/0.4ML IJ SOSY: 200.0000 ug | PREFILLED_SYRINGE | Freq: Once | INTRAMUSCULAR | Status: DC

## 2014-02-10 NOTE — Progress Notes (Signed)
Patient ID: Regina West, female   DOB: 11-14-1929, 80 y.o.   MRN: [id number]  Subjective: 80 year old female returns the office today for painful elongated toenails. She states of the nails are particularly painful with shoe gear. She tried trimming the nails herself since last appointment she said she had some bleeding from the area. She denies any recent redness or drainage from the nail sites. No other complaints at this time. Denies any systemic complaints as fevers, chills, nausea, vomiting. No acute changes and facet pointed.  Objective: AAO x3, NAD DP/PT pulses palpable bilaterally, CRT less than 3 seconds Protective sensation intact with Simms Weinstein monofilament, vibratory sensation intact, Achilles tendon reflex intact Nails hypertrophic, dystrophic, elongated, brittle, discolored 10. No swelling erythema or drainage. Subungual hematoma on the right hallux nail is growing out. No surrounding erythema or drainage from the nail sites.  No open lesions or pre-ulcerative lesions. No pain with calf compression, swelling, warmth, erythema. MMT 5/5, ROM WNL  Assessment: 80 year old female with symptomatic onychomycosis  Plan: -Treatment options discussed including alternatives, risks, complications. -Nail sharply debrided 10 without complications. -Discussed importance of daily foot inspection -Follow-up in 3 months or sooner if any problems are to arise. In the meantime, call the office with any questions, concerns, change in symptoms.

## 2014-02-10 NOTE — Progress Notes (Signed)
Per pharmacy, No aranesp unless hgb: >10.  Patient aware.

## 2014-02-24 ENCOUNTER — Encounter (HOSPITAL_BASED_OUTPATIENT_CLINIC_OR_DEPARTMENT_OTHER): Payer: Medicare Other | Admitting: Oncology

## 2014-02-24 ENCOUNTER — Ambulatory Visit: Payer: Medicare Other

## 2014-02-24 DIAGNOSIS — R5383 Other fatigue: Secondary | ICD-10-CM

## 2014-02-24 DIAGNOSIS — T451X5A Adverse effect of antineoplastic and immunosuppressive drugs, initial encounter: Secondary | ICD-10-CM

## 2014-02-24 DIAGNOSIS — C482 Malignant neoplasm of peritoneum, unspecified: Secondary | ICD-10-CM

## 2014-02-24 DIAGNOSIS — D61810 Antineoplastic chemotherapy induced pancytopenia: Secondary | ICD-10-CM

## 2014-02-24 LAB — CBC WITH DIFFERENTIAL/PLATELET
BASO%: 0.5 % (ref 0.0–2.0)
Basophils Absolute: 0 10*3/uL (ref 0.0–0.1)
EOS%: 4.9 % (ref 0.0–7.0)
Eosinophils Absolute: 0.3 10*3/uL (ref 0.0–0.5)
HCT: 32.9 % — ABNORMAL LOW (ref 34.8–46.6)
HEMOGLOBIN: 10.9 g/dL — AB (ref 11.6–15.9)
LYMPH%: 7.2 % — ABNORMAL LOW (ref 14.0–49.7)
MCH: 36 pg — ABNORMAL HIGH (ref 25.1–34.0)
MCHC: 33.2 g/dL (ref 31.5–36.0)
MCV: 108.3 fL — AB (ref 79.5–101.0)
MONO#: 0.8 10*3/uL (ref 0.1–0.9)
MONO%: 13.8 % (ref 0.0–14.0)
NEUT#: 4.1 10*3/uL (ref 1.5–6.5)
NEUT%: 73.6 % (ref 38.4–76.8)
Platelets: 171 10*3/uL (ref 145–400)
RBC: 3.04 10*6/uL — AB (ref 3.70–5.45)
RDW: 14.9 % — AB (ref 11.2–14.5)
WBC: 5.5 10*3/uL (ref 3.9–10.3)
lymph#: 0.4 10*3/uL — ABNORMAL LOW (ref 0.9–3.3)

## 2014-02-24 LAB — COMPREHENSIVE METABOLIC PANEL (CC13)
ALBUMIN: 3.6 g/dL (ref 3.5–5.0)
ALK PHOS: 109 U/L (ref 40–150)
ALT: 24 U/L (ref 0–55)
AST: 25 U/L (ref 5–34)
Anion Gap: 10 mEq/L (ref 3–11)
BUN: 17.7 mg/dL (ref 7.0–26.0)
CALCIUM: 10 mg/dL (ref 8.4–10.4)
CHLORIDE: 102 meq/L (ref 98–109)
CO2: 24 mEq/L (ref 22–29)
Creatinine: 1.2 mg/dL — ABNORMAL HIGH (ref 0.6–1.1)
EGFR: 44 mL/min/{1.73_m2} — ABNORMAL LOW (ref >90)
GLUCOSE: 129 mg/dL (ref 70–140)
Potassium: 4.7 mEq/L (ref 3.5–5.1)
SODIUM: 136 meq/L (ref 136–145)
TOTAL PROTEIN: 6.3 g/dL — AB (ref 6.4–8.3)
Total Bilirubin: 0.37 mg/dL (ref 0.20–1.20)

## 2014-02-25 LAB — CA 125: CA 125: 19 U/mL (ref <35)

## 2014-03-10 ENCOUNTER — Ambulatory Visit: Payer: Medicare Other

## 2014-03-10 ENCOUNTER — Other Ambulatory Visit (HOSPITAL_BASED_OUTPATIENT_CLINIC_OR_DEPARTMENT_OTHER): Payer: Medicare Other

## 2014-03-10 DIAGNOSIS — T451X5A Adverse effect of antineoplastic and immunosuppressive drugs, initial encounter: Principal | ICD-10-CM

## 2014-03-10 DIAGNOSIS — C482 Malignant neoplasm of peritoneum, unspecified: Secondary | ICD-10-CM

## 2014-03-10 DIAGNOSIS — R5383 Other fatigue: Secondary | ICD-10-CM

## 2014-03-10 DIAGNOSIS — D61810 Antineoplastic chemotherapy induced pancytopenia: Secondary | ICD-10-CM

## 2014-03-10 LAB — CBC WITH DIFFERENTIAL/PLATELET
BASO%: 0.2 % (ref 0.0–2.0)
BASOS ABS: 0 10*3/uL (ref 0.0–0.1)
EOS%: 4.2 % (ref 0.0–7.0)
Eosinophils Absolute: 0.2 10*3/uL (ref 0.0–0.5)
HEMATOCRIT: 34.2 % — AB (ref 34.8–46.6)
HEMOGLOBIN: 11.4 g/dL — AB (ref 11.6–15.9)
LYMPH%: 10.7 % — ABNORMAL LOW (ref 14.0–49.7)
MCH: 36 pg — ABNORMAL HIGH (ref 25.1–34.0)
MCHC: 33.3 g/dL (ref 31.5–36.0)
MCV: 107.9 fL — ABNORMAL HIGH (ref 79.5–101.0)
MONO#: 0.5 10*3/uL (ref 0.1–0.9)
MONO%: 11.7 % (ref 0.0–14.0)
NEUT%: 73.2 % (ref 38.4–76.8)
NEUTROS ABS: 2.9 10*3/uL (ref 1.5–6.5)
PLATELETS: 139 10*3/uL — AB (ref 145–400)
RBC: 3.17 10*6/uL — ABNORMAL LOW (ref 3.70–5.45)
RDW: 13.1 % (ref 11.2–14.5)
WBC: 4 10*3/uL (ref 3.9–10.3)
lymph#: 0.4 10*3/uL — ABNORMAL LOW (ref 0.9–3.3)

## 2014-03-10 LAB — COMPREHENSIVE METABOLIC PANEL (CC13)
ALBUMIN: 3.6 g/dL (ref 3.5–5.0)
ALT: 19 U/L (ref 0–55)
AST: 21 U/L (ref 5–34)
Alkaline Phosphatase: 116 U/L (ref 40–150)
Anion Gap: 11 mEq/L (ref 3–11)
BUN: 24.6 mg/dL (ref 7.0–26.0)
CALCIUM: 9.8 mg/dL (ref 8.4–10.4)
CHLORIDE: 103 meq/L (ref 98–109)
CO2: 24 meq/L (ref 22–29)
Creatinine: 1.2 mg/dL — ABNORMAL HIGH (ref 0.6–1.1)
EGFR: 42 mL/min/{1.73_m2} — AB (ref >90)
GLUCOSE: 169 mg/dL — AB (ref 70–140)
POTASSIUM: 4.2 meq/L (ref 3.5–5.1)
SODIUM: 138 meq/L (ref 136–145)
TOTAL PROTEIN: 6.2 g/dL — AB (ref 6.4–8.3)
Total Bilirubin: 0.34 mg/dL (ref 0.20–1.20)

## 2014-03-10 NOTE — Progress Notes (Signed)
Aranesp injection not given.  Pt HGB 11.4

## 2014-03-24 ENCOUNTER — Other Ambulatory Visit (HOSPITAL_BASED_OUTPATIENT_CLINIC_OR_DEPARTMENT_OTHER): Payer: Medicare Other

## 2014-03-24 ENCOUNTER — Ambulatory Visit: Payer: Medicare Other

## 2014-03-24 ENCOUNTER — Ambulatory Visit (HOSPITAL_BASED_OUTPATIENT_CLINIC_OR_DEPARTMENT_OTHER): Payer: Medicare Other | Admitting: Oncology

## 2014-03-24 VITALS — BP 130/67 | HR 78 | Temp 98.4°F | Resp 18 | Ht 65.0 in | Wt 166.0 lb

## 2014-03-24 DIAGNOSIS — T451X5A Adverse effect of antineoplastic and immunosuppressive drugs, initial encounter: Secondary | ICD-10-CM

## 2014-03-24 DIAGNOSIS — G3189 Other specified degenerative diseases of nervous system: Secondary | ICD-10-CM

## 2014-03-24 DIAGNOSIS — R5383 Other fatigue: Secondary | ICD-10-CM

## 2014-03-24 DIAGNOSIS — C482 Malignant neoplasm of peritoneum, unspecified: Secondary | ICD-10-CM

## 2014-03-24 DIAGNOSIS — G63 Polyneuropathy in diseases classified elsewhere: Secondary | ICD-10-CM

## 2014-03-24 DIAGNOSIS — D61810 Antineoplastic chemotherapy induced pancytopenia: Secondary | ICD-10-CM

## 2014-03-24 DIAGNOSIS — D649 Anemia, unspecified: Secondary | ICD-10-CM

## 2014-03-24 DIAGNOSIS — C801 Malignant (primary) neoplasm, unspecified: Secondary | ICD-10-CM

## 2014-03-24 DIAGNOSIS — G40909 Epilepsy, unspecified, not intractable, without status epilepticus: Secondary | ICD-10-CM

## 2014-03-24 DIAGNOSIS — I951 Orthostatic hypotension: Secondary | ICD-10-CM

## 2014-03-24 DIAGNOSIS — D630 Anemia in neoplastic disease: Secondary | ICD-10-CM

## 2014-03-24 LAB — CBC WITH DIFFERENTIAL/PLATELET
BASO%: 0.4 % (ref 0.0–2.0)
Basophils Absolute: 0 10*3/uL (ref 0.0–0.1)
EOS%: 2.6 % (ref 0.0–7.0)
Eosinophils Absolute: 0.1 10*3/uL (ref 0.0–0.5)
HEMATOCRIT: 34 % — AB (ref 34.8–46.6)
HEMOGLOBIN: 11.2 g/dL — AB (ref 11.6–15.9)
LYMPH%: 8.1 % — AB (ref 14.0–49.7)
MCH: 36.2 pg — AB (ref 25.1–34.0)
MCHC: 33.1 g/dL (ref 31.5–36.0)
MCV: 109.3 fL — AB (ref 79.5–101.0)
MONO#: 0.7 10*3/uL (ref 0.1–0.9)
MONO%: 14.3 % — ABNORMAL HIGH (ref 0.0–14.0)
NEUT#: 3.6 10*3/uL (ref 1.5–6.5)
NEUT%: 74.6 % (ref 38.4–76.8)
Platelets: 155 10*3/uL (ref 145–400)
RBC: 3.11 10*6/uL — ABNORMAL LOW (ref 3.70–5.45)
RDW: 12.7 % (ref 11.2–14.5)
WBC: 4.8 10*3/uL (ref 3.9–10.3)
lymph#: 0.4 10*3/uL — ABNORMAL LOW (ref 0.9–3.3)

## 2014-03-24 MED ORDER — DARBEPOETIN ALFA 200 MCG/0.4ML IJ SOSY: 200.0000 ug | PREFILLED_SYRINGE | Freq: Once | INTRAMUSCULAR | Status: DC

## 2014-03-24 NOTE — Addendum Note (Signed)
Addended by: Laureen Abrahams on: 03/24/2014 05:53 PM   Modules accepted: Orders, Medications

## 2014-03-24 NOTE — Progress Notes (Signed)
ID: Regina West   DOB: 04-25-1929  MR#: [id number]  WVP#:[id number]  PCP: [name], MD GYN: SU: Stephan Minister, MD OTHER MD: Larey Seat, MD  CHIEF COMPLAINT:  Recurrent ovarian cancer TREATMENT: observation  HISTORY OF PRESENT ILLNESS: From the original intake nodes:  The patient originally presented in the summer of 2010 with cramps and abdominal distension.  I do not have a copy of the initial evaluation, but on November 26, 2008, the patient underwent optimal debulking with bilateral salpingo-oophorectomy, omentectomy, and the placement of an intraperitoneal port.  The pathology from that procedure (Accession Number [id number] at [facility]) showed first of all, significant involvement of the omentum, minimal involvement of the right ovary and right fallopian tube and negative on the left ovary and fallopian tube, with neither ovary being enlarged, consistent with a primary peritoneal serous adenocarcinoma, described as moderately differentiated.  The sample included no lymph nodes.    The patient had an intraperitoneal port placed in the same surgery and was treated with intraperitoneal and IV chemotherapy according to GOG-252 but I am not sure which arm she was in.  (Arm 2 did carboplatin intraperitoneally and Taxol IV.  Arm 3 did cisplatin and paclitaxel intraperitoneally with paclitaxel IV.)  All arms received bevacizumab.  Unfortunately, after 4 cycles of treatment, she had acute mental status changes and was admitted here January of 2011 with what proved to be posterior reversible leukoencephalopathy and SIADH.  She had seizures, aphasia, and required intubation.  Once the patient recovered from this, she was treated with 2 cycles of single-agent carboplatin (I do not have the AUC). Her last adjuvant treatment was May 12, 2009, and her CA-125 at that time was 10.3.  Her intraperitoneal port was removed in April of 2011.    More recently, the patient was in routine follow up when her  CA-125 was found to jump up to 2,269.7 (March 26, 2010).  This is 10 months after her last prior chemotherapy.  She had CTs of the chest, abdomen and pelvis January 26th which showed ascites and enhancing peritoneal nodularity. She had had similar findings at presentation but these had completely resolved by the time she finished treatment in March of 2011. The patient was felt to be in first relapse.   Her subsequent history is as detailed below  INTERVAL HISTORY:  [name] returns today for follow up of her ovarian cancer, accompanied by her daughter. The interval history is unremarkable. The holidays were "fine". She has gotten used to [employer]. She does not like the food, but she does go down for some and socialize as some. She has been scheduled for Aranesp year every 2 weeks but her hemoglobin has been rising and I do not think she will need it all less than until we resume chemotherapy.  REVIEW OF SYSTEMS:  Bernette tells me her mind is not what it should be, but she was able to give me a very good account of her breakfast today, she knew where I was going on vacation, and she was able to tell me what happened in [place]. She is on vilazodone and tells me it is expensive, but she has brought this up to her primary care physician and the fact is the drug is working for her. She tells me she is sometimes short of breath when she walks. She is not exercising on a regular basis but she does do a fair amount of walking at [place]. She keeps a dry cough. She has some stress  urinary incontinence. She has not had any problems with bladder habits and denies progressive abdominal distention. Her weight is actually down a little bit, which is a good indicator that she is not picking up ascites. A detailed review of systems today was otherwise stable  PAST MEDICAL HISTORY: Significant for hysterectomy at the age of 40 with concurrent bladder "tuck up."  She also underwent appendectomy during that procedure.   She is status post prior bilateral cataract surgery. She is status post tonsillectomy and adenoidectomy.  She has a history of hypertension and diabetes. She has a history of diverticulosis.  She has a large hiatal hernia.  She has degenerative disk disease.  She has been noted to have coronary calcifications and she has hyperlipidemia.  There is a history of remote tobacco abuse. Seizure disorder as per HPI  FAMILY HISTORY The patient's father died at the age of 47 after heart surgery for aortic stenosis.  The patient's mother died at the age of 83.  The patient has one brother in good health.  There is no other family history to her knowledge of ovarian or breast cancer.    GYNECOLOGIC HISTORY: Menarche at age 39.  She is GX P3 with menopause in her late 68s.  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 She is currently residing in [place]. Her husband was in sales and had a history of Parkinson's disease.  He died following a fall in the year 2000.  The patient's son Regina West lives in [place], and has a history of MS.  Daughter Regina West lives in [place]. She is a Agricultural engineer.  Son Regina West lives in [place], [place] and is in Press photographer. The patient has 8 grandchildren. She attends the [employer].  She is a good friend of our patients J. and [place] DIRECTIVES: in place  HEALTH MAINTENANCE:  (Updated December 2014) History  Substance Use Topics  . Smoking status: Former Smoker    Quit date: 05/23/1964  . Smokeless tobacco: Never Used  . Alcohol Use: 0.0 oz/week     Comment: rarely     Colonoscopy: 2008  PAP: UTD  MM:  Declines   Allergies  Allergen Reactions  . Avastin [Bevacizumab] Other (See Comments)    Swelling of the brain     Current Outpatient Prescriptions  Medication Sig Dispense Refill  . acyclovir (ZOVIRAX) 400  MG tablet Take 1 tablet (400 mg total) by mouth 2 (two) times daily. 60 tablet 11  . antiseptic oral rinse (BIOTENE) LIQD 15 mLs by Mouth Rinse route 2 (two) times daily. 1 Bottle 0  . cholecalciferol (VITAMIN D) 1000 UNITS tablet Take 1,000 Units by mouth 2 (two) times daily.    Marland Kitchen dexamethasone (DECADRON) 4 MG tablet Take 2 tablets (8 mg total) by mouth 2 (two) times daily with a meal. Start the day after chemotherapy for 3 days. 30 tablet 1  . levETIRAcetam (KEPPRA) 250 MG tablet Take 1 tablet (250 mg total) by mouth 2 (two) times daily. 60 tablet 11  . lidocaine-prilocaine (EMLA) cream Apply 1 application topically as needed. 30 g 0  . nitrofurantoin (MACRODANTIN) 50 MG capsule TAKE TWO CAPSULES BY MOUTH ONCE DAILY 60 capsule 2  . omeprazole (PRILOSEC) 40 MG capsule Take 1 capsule (40 mg total) by mouth at bedtime. 30 capsule 2  . ondansetron (ZOFRAN) 8  MG tablet Take 1 tablet (8 mg total) by mouth 2 (two) times daily. Start the day after chemo for 3 days. Then take as needed for nausea or vomiting. 30 tablet 1  . prochlorperazine (COMPAZINE) 10 MG tablet Take 1 tablet (10 mg total) by mouth every 6 (six) hours as needed (Nausea or vomiting). 30 tablet 1  . VIIBRYD 40 MG TABS      No current facility-administered medications for this visit.   Facility-Administered Medications Ordered in Other Visits  Medication Dose Route Frequency Provider Last Rate Last Dose  . sodium chloride 0.9 % injection 10 mL  10 mL Intracatheter PRN Chauncey Cruel, MD   10 mL at 10/25/13 1608  . sodium chloride 0.9 % injection 3 mL  3 mL Intracatheter PRN Chauncey Cruel, MD        OBJECTIVE: Elderly white woman who appears stated age 81 Vitals:   03/24/14 1103  BP: 130/67  Pulse: 78  Temp: 98.4 F (36.9 C)  Resp: 18     Body mass index is 27.62 kg/(m^2).    ECOG FS: 1 Filed Weights   03/24/14 1103  Weight: 166 lb (75.297 kg)    Sclerae unicteric, full EOMs Oropharynx clear and moist No  cervical or supraclavicular adenopathy Lungs no rales or rhonchi Heart regular rate and rhythm Abd soft, nontender, positive bowel sounds, no masses palpated MSK no focal spinal tenderness, minimal bilateral ankle edema Neuro: nonfocal, well-oriented, appropriate affect Breasts: Deferred    Lab Results  Component Value Date   WBC 4.8 03/24/2014   NEUTROABS 3.6 03/24/2014   HGB 11.2* 03/24/2014   HCT 34.0* 03/24/2014   MCV 109.3* 03/24/2014   PLT 155 03/24/2014       Chemistry      Component Value Date/Time   NA 138 03/10/2014 1135   NA 136 10/17/2011 1118   NA 143 05/12/2011 1337   K 4.2 03/10/2014 1135   K 3.9 10/17/2011 1118   K 4.1 05/12/2011 1337   CL 100 08/20/2012 1124   CL 100 10/17/2011 1118   CL 92* 05/12/2011 1337   CO2 24 03/10/2014 1135   CO2 27 10/17/2011 1118   CO2 32 05/12/2011 1337   West 24.6 03/10/2014 1135   West 25* 10/17/2011 1118   West 19 05/12/2011 1337   CREATININE 1.2* 03/10/2014 1135   CREATININE 1.10 10/17/2011 1118   CREATININE 1.3* 05/12/2011 1337      Component Value Date/Time   CALCIUM 9.8 03/10/2014 1135   CALCIUM 9.8 10/17/2011 1118   CALCIUM 9.8 05/12/2011 1337   ALKPHOS 116 03/10/2014 1135   ALKPHOS 109 10/17/2011 1118   ALKPHOS 95* 05/12/2011 1337   AST 21 03/10/2014 1135   AST 22 10/17/2011 1118   AST 40* 05/12/2011 1337   ALT 19 03/10/2014 1135   ALT 14 10/17/2011 1118   ALT 28 05/12/2011 1337   BILITOT 0.34 03/10/2014 1135   BILITOT 0.4 10/17/2011 1118   BILITOT 0.60 05/12/2011 1337    Results for DESTINEE, TABER (MRN [id number]) as of 03/24/2014 11:40  Ref. Range 11/18/2013 15:31 12/09/2013 11:45 12/30/2013 11:23 01/27/2014 11:25 02/24/2014 11:11  CA 125 Latest Range: <35 U/mL 11.2 13 16 15 19     STUDIES: No results found.   ASSESSMENT: 81 y.o.  [place] woman   (1) status post optimal debulking of a primary peritoneal serous adenocarcinoma September 2010, the tumor being moderately differentiated, pT3c NX  (stage IIIC), treated according to GOG 252 with  intraperitoneal platinum and paclitaxel x4 given along with bevacizumab, complicated by SIADH after cycle 4, also with posterior reversible leukoencephalopathy.  After these problems resolved she received 2 additional cycles of single-agent carboplatin completed in March 2011  (2)  She had her first recurrence February 2012  treated with carboplatin and Gemzar for a total of 6 cycles between February and June 2012.  Her CEA-125 had normalized before the beginning of cycle 5.   (3) second recurrence documented February 2013,  treated with carboplatin/ doxil x 6, completed 10/03/2011, with a good response noted on restaging scans  (4) third recurrence documented February 2014, receiving single-agent carboplatin every 3 weeks, first dose 04/30/2012, completed 08/20/2012  (5) fourth recurrence documented by abdominal symptoms and a rising CA 125 November 2014, receiving Abraxane, initially on days one and 8 of each 21 day cycle, with first dose given on 01/16/2013; starting 04/22/2013 she received the Abraxane every 2 weeks--rising CA 125 noted 05/31/2013  (6) started cyclophosphamide/ with "low dose" carboplatin 06/17/2013,  repeated every 21 days x8 doses, completed 12/09/13  PLAN:  Selin gives me no clinical evidence of disease progression. Of course that does not mean her CA 125 will be normal today. If it is elevated we will likely repeat it in 2 weeks and then see her to discuss resuming treatment. Hopefully though she will have a longer remission this time, since she did receive a few extra chemotherapy cycles with her most recent treatment course.  She does not need Aranesp today and I do not think she will be needing it unless we resume chemotherapy. We are changing her lab work to every 28 days instead of every 14 days accordingly.  If she has her lab work drawn through her port, it will be automatically flushed. That will save her extra  trips  Gianni has a good understanding of the overall plan. She agrees with it. She knows the goal of treatment in her case is control. She will call with any problems that may develop before her next visit here.  Chauncey Cruel, MD  Chauncey Cruel, MD

## 2014-03-25 LAB — CA 125: CA 125: 99 U/mL — AB (ref <35)

## 2014-03-31 ENCOUNTER — Telehealth: Payer: Self-pay | Admitting: *Deleted

## 2014-04-10 ENCOUNTER — Other Ambulatory Visit: Payer: Self-pay | Admitting: Nurse Practitioner

## 2014-04-10 ENCOUNTER — Telehealth: Payer: Self-pay | Admitting: *Deleted

## 2014-04-10 ENCOUNTER — Telehealth: Payer: Self-pay | Admitting: Nurse Practitioner

## 2014-04-10 NOTE — Telephone Encounter (Signed)
per pof to move pt appt-HF stated she will call pt to adv

## 2014-04-10 NOTE — Telephone Encounter (Signed)
Called and spoke with pt abt schedule changes. Communicated with pt to come Feb.8 @ 11:15 for labs and flush will follow that appt. Pt will see Dr. Jana Hakim on Feb.15 @ 9:00a. Pt verbalized understanding and wrote appts down. Message to be forwarded to [facility].

## 2014-04-13 ENCOUNTER — Other Ambulatory Visit: Payer: Self-pay | Admitting: Oncology

## 2014-04-14 ENCOUNTER — Other Ambulatory Visit (HOSPITAL_BASED_OUTPATIENT_CLINIC_OR_DEPARTMENT_OTHER): Payer: Medicare Other

## 2014-04-14 ENCOUNTER — Ambulatory Visit (HOSPITAL_BASED_OUTPATIENT_CLINIC_OR_DEPARTMENT_OTHER): Payer: Medicare Other

## 2014-04-14 VITALS — BP 128/70 | HR 80 | Temp 97.8°F

## 2014-04-14 DIAGNOSIS — R5383 Other fatigue: Secondary | ICD-10-CM

## 2014-04-14 DIAGNOSIS — C482 Malignant neoplasm of peritoneum, unspecified: Secondary | ICD-10-CM

## 2014-04-14 DIAGNOSIS — D61810 Antineoplastic chemotherapy induced pancytopenia: Secondary | ICD-10-CM

## 2014-04-14 DIAGNOSIS — T451X5A Adverse effect of antineoplastic and immunosuppressive drugs, initial encounter: Secondary | ICD-10-CM

## 2014-04-14 DIAGNOSIS — Z95828 Presence of other vascular implants and grafts: Secondary | ICD-10-CM

## 2014-04-14 DIAGNOSIS — Z452 Encounter for adjustment and management of vascular access device: Secondary | ICD-10-CM

## 2014-04-14 LAB — COMPREHENSIVE METABOLIC PANEL (CC13)
ALT: 18 U/L (ref 0–55)
ANION GAP: 12 meq/L — AB (ref 3–11)
AST: 21 U/L (ref 5–34)
Albumin: 3.7 g/dL (ref 3.5–5.0)
Alkaline Phosphatase: 97 U/L (ref 40–150)
BILIRUBIN TOTAL: 0.32 mg/dL (ref 0.20–1.20)
BUN: 24.7 mg/dL (ref 7.0–26.0)
CO2: 23 meq/L (ref 22–29)
Calcium: 9.7 mg/dL (ref 8.4–10.4)
Chloride: 101 mEq/L (ref 98–109)
Creatinine: 1.3 mg/dL — ABNORMAL HIGH (ref 0.6–1.1)
EGFR: 37 mL/min/{1.73_m2} — AB (ref >90)
GLUCOSE: 289 mg/dL — AB (ref 70–140)
POTASSIUM: 4.4 meq/L (ref 3.5–5.1)
SODIUM: 136 meq/L (ref 136–145)
Total Protein: 6.1 g/dL — ABNORMAL LOW (ref 6.4–8.3)

## 2014-04-14 LAB — CBC WITH DIFFERENTIAL/PLATELET
BASO%: 0.7 % (ref 0.0–2.0)
BASOS ABS: 0 10*3/uL (ref 0.0–0.1)
EOS%: 2.2 % (ref 0.0–7.0)
Eosinophils Absolute: 0.1 10*3/uL (ref 0.0–0.5)
HEMATOCRIT: 35.8 % (ref 34.8–46.6)
HEMOGLOBIN: 11.7 g/dL (ref 11.6–15.9)
LYMPH%: 8.6 % — ABNORMAL LOW (ref 14.0–49.7)
MCH: 35.3 pg — ABNORMAL HIGH (ref 25.1–34.0)
MCHC: 32.7 g/dL (ref 31.5–36.0)
MCV: 107.9 fL — ABNORMAL HIGH (ref 79.5–101.0)
MONO#: 0.5 10*3/uL (ref 0.1–0.9)
MONO%: 11.6 % (ref 0.0–14.0)
NEUT%: 76.9 % — AB (ref 38.4–76.8)
NEUTROS ABS: 3.1 10*3/uL (ref 1.5–6.5)
PLATELETS: 167 10*3/uL (ref 145–400)
RBC: 3.32 10*6/uL — AB (ref 3.70–5.45)
RDW: 12.3 % (ref 11.2–14.5)
WBC: 4 10*3/uL (ref 3.9–10.3)
lymph#: 0.3 10*3/uL — ABNORMAL LOW (ref 0.9–3.3)

## 2014-04-14 MED ORDER — HEPARIN SOD (PORK) LOCK FLUSH 100 UNIT/ML IV SOLN
500.0000 [IU] | Freq: Once | INTRAVENOUS | Status: AC
  Administered 2014-04-14: 500 [IU] via INTRAVENOUS
  Filled 2014-04-14: qty 5

## 2014-04-14 MED ORDER — SODIUM CHLORIDE 0.9 % IJ SOLN
10.0000 mL | INTRAMUSCULAR | Status: DC | PRN
  Administered 2014-04-14: 10 mL via INTRAVENOUS
  Filled 2014-04-14: qty 10

## 2014-04-14 NOTE — Patient Instructions (Signed)

## 2014-04-15 ENCOUNTER — Telehealth: Payer: Self-pay | Admitting: *Deleted

## 2014-04-15 DIAGNOSIS — C482 Malignant neoplasm of peritoneum, unspecified: Secondary | ICD-10-CM

## 2014-04-15 DIAGNOSIS — K219 Gastro-esophageal reflux disease without esophagitis: Secondary | ICD-10-CM

## 2014-04-15 LAB — CA 125: CA 125: 363 U/mL — ABNORMAL HIGH (ref <35)

## 2014-04-15 MED ORDER — NITROFURANTOIN MACROCRYSTAL 50 MG PO CAPS: ORAL_CAPSULE | ORAL | Status: AC

## 2014-04-15 MED ORDER — OMEPRAZOLE 40 MG PO CPDR: 40.0000 mg | DELAYED_RELEASE_CAPSULE | Freq: Every day | ORAL | Status: AC

## 2014-04-15 NOTE — Telephone Encounter (Signed)
Refill for nitrofurantoin and omeprazole sent to patient's pharmacy. Called and left VM for patient letting her know this was done.

## 2014-04-18 ENCOUNTER — Telehealth: Payer: Self-pay | Admitting: Oncology

## 2014-04-21 ENCOUNTER — Ambulatory Visit: Payer: Medicare Other

## 2014-04-21 ENCOUNTER — Ambulatory Visit (HOSPITAL_BASED_OUTPATIENT_CLINIC_OR_DEPARTMENT_OTHER): Payer: Medicare Other | Admitting: Oncology

## 2014-04-21 ENCOUNTER — Other Ambulatory Visit: Payer: Medicare Other

## 2014-04-21 ENCOUNTER — Other Ambulatory Visit: Payer: Self-pay | Admitting: Emergency Medicine

## 2014-04-21 ENCOUNTER — Encounter: Payer: Self-pay | Admitting: Oncology

## 2014-04-21 VITALS — BP 113/78 | HR 80 | Temp 98.1°F | Resp 18 | Ht 65.0 in | Wt 161.7 lb

## 2014-04-21 DIAGNOSIS — D630 Anemia in neoplastic disease: Secondary | ICD-10-CM

## 2014-04-21 DIAGNOSIS — G3189 Other specified degenerative diseases of nervous system: Secondary | ICD-10-CM

## 2014-04-21 DIAGNOSIS — C7961 Secondary malignant neoplasm of right ovary: Secondary | ICD-10-CM

## 2014-04-21 DIAGNOSIS — C482 Malignant neoplasm of peritoneum, unspecified: Secondary | ICD-10-CM

## 2014-04-21 DIAGNOSIS — C569 Malignant neoplasm of unspecified ovary: Secondary | ICD-10-CM

## 2014-04-21 DIAGNOSIS — D649 Anemia, unspecified: Secondary | ICD-10-CM

## 2014-04-21 DIAGNOSIS — G63 Polyneuropathy in diseases classified elsewhere: Secondary | ICD-10-CM

## 2014-04-21 DIAGNOSIS — C801 Malignant (primary) neoplasm, unspecified: Secondary | ICD-10-CM

## 2014-04-21 DIAGNOSIS — E119 Type 2 diabetes mellitus without complications: Secondary | ICD-10-CM

## 2014-04-21 HISTORY — DX: Type 2 diabetes mellitus without complications: E11.9

## 2014-04-21 MED ORDER — AMPHETAMINE-DEXTROAMPHETAMINE 5 MG PO TABS: 5.0000 mg | ORAL_TABLET | Freq: Two times a day (BID) | ORAL | Status: DC

## 2014-04-21 MED ORDER — DARBEPOETIN ALFA 200 MCG/0.4ML IJ SOSY: 200.0000 ug | PREFILLED_SYRINGE | Freq: Once | INTRAMUSCULAR | Status: DC

## 2014-04-21 NOTE — Progress Notes (Signed)
ID: Regina West   DOB: 16-Jul-1929  MR#: [id number]  JKK#:[id number]  PCP: [name], MD GYN: SU: Stephan Minister, MD OTHER MD: Larey Seat, MD  CHIEF COMPLAINT:  Recurrent ovarian cancer CURRENT TREATMENT: Liposomal doxorubicin  HISTORY OF PRESENT ILLNESS: From the original intake nodes:  The patient originally presented in the summer of 2010 with cramps and abdominal distension.  I do not have a copy of the initial evaluation, but on November 26, 2008, the patient underwent optimal debulking with bilateral salpingo-oophorectomy, omentectomy, and the placement of an intraperitoneal port.  The pathology from that procedure (Accession Number [id number] at [facility]) showed first of all, significant involvement of the omentum, minimal involvement of the right ovary and right fallopian tube and negative on the left ovary and fallopian tube, with neither ovary being enlarged, consistent with a primary peritoneal serous adenocarcinoma, described as moderately differentiated.  The sample included no lymph nodes.    The patient had an intraperitoneal port placed in the same surgery and was treated with intraperitoneal and IV chemotherapy according to GOG-252 but I am not sure which arm she was in.  (Arm 2 did carboplatin intraperitoneally and Taxol IV.  Arm 3 did cisplatin and paclitaxel intraperitoneally with paclitaxel IV.)  All arms received bevacizumab.  Unfortunately, after 4 cycles of treatment, she had acute mental status changes and was admitted here January of 2011 with what proved to be posterior reversible leukoencephalopathy and SIADH.  She had seizures, aphasia, and required intubation.  Once the patient recovered from this, she was treated with 2 cycles of single-agent carboplatin (I do not have the AUC). Her last adjuvant treatment was May 12, 2009, and her CA-125 at that time was 10.3.  Her intraperitoneal port was removed in April of 2011.    More recently, the patient was in routine  follow up when her CA-125 was found to jump up to 2,269.7 (March 26, 2010).  This is 10 months after her last prior chemotherapy.  She had CTs of the chest, abdomen and pelvis January 26th which showed ascites and enhancing peritoneal nodularity. She had had similar findings at presentation but these had completely resolved by the time she finished treatment in March of 2011. The patient was felt to be in first relapse.   Her subsequent history is as detailed below  INTERVAL HISTORY:  [name] returns today for follow up of her ovarian cancer, accompanied by her daughter in law. She has been off treatment now nearly 4 months. Unfortunately her CEA 125 has started rising. She is here to discuss resumption of treatment.  REVIEW OF SYSTEMS:  Regina West is doing physical therapy and enjoying it greatly. She tells me her mind as finally cleared up and she doesn't have "chemo brain" anymore. She has an excellent appetite. There has been no nausea, vomiting, difficulty swallowing, pain on swallowing, or any change in bowel or bladder habits. She gets occasional diarrhea from the metformin. Her blood sugars fasting have been well-controlled. She has very minimal peripheral neuropathy symptoms. A detailed review of systems today was otherwise stable.  PAST MEDICAL HISTORY: Significant for hysterectomy at the age of 81 with concurrent bladder "tuck up."  She also underwent appendectomy during that procedure. She is status post prior bilateral cataract surgery. She is status post tonsillectomy and adenoidectomy.  She has a history of hypertension and diabetes. She has a history of diverticulosis.  She has a large hiatal hernia.  She has degenerative disk disease.  She has been noted  to have coronary calcifications and she has hyperlipidemia.  There is a history of remote tobacco abuse. Seizure disorder as per HPI  FAMILY HISTORY The patient's father died at the age of 52 after heart surgery for aortic stenosis.  The  patient's mother died at the age of 11.  The patient has one brother in good health.  There is no other family history to her knowledge of ovarian or breast cancer.    GYNECOLOGIC HISTORY: Menarche at age 81.  She is GX P3 with menopause in her late 74s.  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 She is currently residing in [place]. Her husband was in sales and had a history of Parkinson's disease.  He died following a fall in the year 2000.  The patient's son Regina West lives in [place], and has a history of MS.  Daughter Regina West lives in [place].. She is a Agricultural engineer.  Son Regina West lives in [place], [place] and is in Press photographer. The patient has 8 grandchildren. She attends the [employer].  She is a good friend of our patients J. and [place] DIRECTIVES: in place  HEALTH MAINTENANCE:  (Updated December 2014) History  Substance Use Topics  . Smoking status: Former Smoker    Quit date: 05/23/1964  . Smokeless tobacco: Never Used  . Alcohol Use: 0.0 oz/week     Comment: rarely     Colonoscopy: 2008  PAP: UTD  MM:  Declines   Allergies  Allergen Reactions  . Avastin [Bevacizumab] Other (See Comments)    Swelling of the brain     Current Outpatient Prescriptions  Medication Sig Dispense Refill  . acyclovir (ZOVIRAX) 400 MG tablet Take 1 tablet (400 mg total) by mouth 2 (two) times daily. 60 tablet 11  . amphetamine-dextroamphetamine (ADDERALL) 5 MG tablet Take 1 tablet by mouth 2 (two) times daily with breakfast and lunch. 60 tablet 0  . antiseptic oral rinse (BIOTENE) LIQD 15 mLs by Mouth Rinse route 2 (two) times daily. 1 Bottle 0  . cholecalciferol (VITAMIN D) 1000 UNITS tablet Take 1,000 Units by mouth 2 (two) times daily.    Marland Kitchen levETIRAcetam (KEPPRA) 250 MG tablet Take 1 tablet (250 mg total) by mouth 2 (two) times daily. 60  tablet 11  . lidocaine-prilocaine (EMLA) cream Apply 1 application topically as needed. 30 g 0  . metFORMIN (GLUCOPHAGE) 500 MG tablet   0  . nitrofurantoin (MACRODANTIN) 50 MG capsule TAKE TWO CAPSULES BY MOUTH ONCE DAILY 60 capsule 2  . omeprazole (PRILOSEC) 40 MG capsule Take 1 capsule (40 mg total) by mouth at bedtime. 30 capsule 2  . VIIBRYD 40 MG TABS      No current facility-administered medications for this visit.   Facility-Administered Medications Ordered in Other Visits  Medication Dose Route Frequency Provider Last Rate Last Dose  . sodium chloride 0.9 % injection 3 mL  3 mL Intracatheter PRN Chauncey Cruel, MD        OBJECTIVE: Elderly white woman in no acute distress Filed Vitals:   04/21/14 0924  BP: 113/78  Pulse: 80  Temp: 98.1 F (36.7 C)  Resp: 18     Body mass index is 26.91 kg/(m^2).    ECOG FS: 1 Filed Weights   04/21/14 0924  Weight: 161 lb 11.2 oz (73.347 kg)    Sclerae  unicteric, pupils round and equal Oropharynx clear and moist, dentition in good repair No cervical or supraclavicular adenopathy Lungs no rales or rhonchi Heart regular rate and rhythm Abd soft, nontender, positive bowel sounds, no masses palpated, no fluid wave MSK no focal spinal tenderness Neuro: nonfocal, well-oriented, positive affect Breasts: Deferred    Lab Results  Component Value Date   WBC 4.0 04/14/2014   NEUTROABS 3.1 04/14/2014   HGB 11.7 04/14/2014   HCT 35.8 04/14/2014   MCV 107.9* 04/14/2014   PLT 167 04/14/2014       Chemistry      Component Value Date/Time   NA 136 04/14/2014 1142   NA 136 10/17/2011 1118   NA 143 05/12/2011 1337   K 4.4 04/14/2014 1142   K 3.9 10/17/2011 1118   K 4.1 05/12/2011 1337   CL 100 08/20/2012 1124   CL 100 10/17/2011 1118   CL 92* 05/12/2011 1337   CO2 23 04/14/2014 1142   CO2 27 10/17/2011 1118   CO2 32 05/12/2011 1337   West 24.7 04/14/2014 1142   West 25* 10/17/2011 1118   West 19 05/12/2011 1337   CREATININE  1.3* 04/14/2014 1142   CREATININE 1.10 10/17/2011 1118   CREATININE 1.3* 05/12/2011 1337      Component Value Date/Time   CALCIUM 9.7 04/14/2014 1142   CALCIUM 9.8 10/17/2011 1118   CALCIUM 9.8 05/12/2011 1337   ALKPHOS 97 04/14/2014 1142   ALKPHOS 109 10/17/2011 1118   ALKPHOS 95* 05/12/2011 1337   AST 21 04/14/2014 1142   AST 22 10/17/2011 1118   AST 40* 05/12/2011 1337   ALT 18 04/14/2014 1142   ALT 14 10/17/2011 1118   ALT 28 05/12/2011 1337   BILITOT 0.32 04/14/2014 1142   BILITOT 0.4 10/17/2011 1118   BILITOT 0.60 05/12/2011 1337     Results for ARIEON, SCALZO (MRN [id number]) as of 04/21/2014 10:18  Ref. Range 12/30/2013 11:23 01/27/2014 11:25 02/24/2014 11:11 03/24/2014 10:48 04/14/2014 11:41  CA 125 Latest Range: <35 U/mL 16 15 19  99 (H) 363 (H)    STUDIES: No results found.   ASSESSMENT: 81 y.o.  [place] woman   (1) status post optimal debulking of a primary peritoneal serous adenocarcinoma September 2010, the tumor being moderately differentiated, pT3c NX (stage IIIC), treated according to GOG 252 with  intraperitoneal platinum and paclitaxel x4 given along with bevacizumab, complicated by SIADH after cycle 4, also with posterior reversible leukoencephalopathy.  After these problems resolved she received 2 additional cycles of single-agent carboplatin completed in March 2011  (2)  She had her first recurrence February 2012  treated with carboplatin and Gemzar for a total of 6 cycles between February and June 2012.  Her CEA-125 had normalized before the beginning of cycle 5.   (3) second recurrence documented February 2013,  treated with carboplatin/ doxil x 6, completed 10/03/2011, with a good response noted on restaging scans  (4) third recurrence documented February 2014, receiving single-agent carboplatin every 3 weeks, first dose 04/30/2012, completed 08/20/2012  (5) fourth recurrence documented by abdominal symptoms and a rising CA 125 November 2014,  receiving Abraxane, initially on days one and 8 of each 21 day cycle, with first dose given on 01/16/2013; starting 04/22/2013 she received the Abraxane every 2 weeks--rising CA 125 noted 05/31/2013  (6) started cyclophosphamide/ with "low dose" carboplatin 06/17/2013,  repeated every 21 days x8 doses, completed 12/09/13, with a rising CA 125 noted 3 months after discontinuation of treatment  (7) fifth  recurrence February 2016, to star liposomal doxorybicin 04/28/2014  PLAN:  Wandra feels great, and in particular she is enjoying not having "chemo brain". However her CEA 125 is rapidly rising. We again discussed no further treatment as an option, but she is doing so well and has tolerated treatment so well lately that I think further therapy is clearly indicated. She and her family agree.  We are going to give liposomal doxorubicin a try. She will receive this every 28 days. We discussed the possible toxicities, side effects and complications including mucositis, weakening of the heart muscle, palmar or plantar erythrodysesthesia, and peripheral neuropathy. She will have an echocardiogram before the start of treatment.  They understand that with this agent there may be a further rise in the CEA 125 before it plateaus and starts coming down. I would want to do at least 3 more likely 4 doses before deciding that it is not working if it is not working.  As far as her clarity of mind is concerned, I think she might benefit from Adderall 5 mg at breakfast and lunch and I went ahead and wrote her a prescription.  She already has an appointment to see me mid March. She will keep that appointment but of course she will call with any problems that may develop before her next visit here. Chauncey Cruel, MD  Chauncey Cruel, MD

## 2014-04-21 NOTE — Addendum Note (Signed)
Addended by: Jaci Carrel A on: 04/21/2014 12:53 PM   Modules accepted: Medications

## 2014-04-22 ENCOUNTER — Telehealth: Payer: Self-pay | Admitting: Oncology

## 2014-04-22 ENCOUNTER — Other Ambulatory Visit: Payer: Self-pay | Admitting: Oncology

## 2014-04-22 NOTE — Telephone Encounter (Signed)
Not able to schedule appts when pt checked out yesterday due to no orders and no availability for f/u. message to [facility] and desk nurse yesterday re orders and appts. per response from desk nurse yesterday echo order has been entered and tx should start 2/22. s/w desk nurse today re f/u (val) per desk see if pt can come in at 8am any day the week of 3/7. GM is only in office 3/7 - 3/8 - 3/9. Today after speaking with Harrie Foreman who brings pt to appts neither of these days at 8am is good. appt scheduled in an open slot at 1pm on 3/9. Per Lesa since she cannot bring pt on days other than mon/fri 3/9 for lb/fu @ 12:30pm will be more convenient for pt's dtr in law. Lesa given new appts for 2/22 - 3/9 - 3/21. Other appts remain the same.   Also called Lesa again today with echo appt for 2/19 @ 11am @ [facility]. lmonvm for Lesa re this appt and she was aware that I would be calling back w/echo appt. Per Lesa ok to just leave her a message.

## 2014-04-24 ENCOUNTER — Telehealth: Payer: Self-pay | Admitting: *Deleted

## 2014-04-24 NOTE — Telephone Encounter (Signed)
Regina West called requesting office notes. Patient was under a clinical trial at [facility] and they need records for documenation purposes.  Call transferred to [facility] in [place].M.

## 2014-04-25 ENCOUNTER — Ambulatory Visit (HOSPITAL_COMMUNITY)
Admission: RE | Admit: 2014-04-25 | Discharge: 2014-04-25 | Disposition: A | Discharge status: Home / Self Care | Payer: Medicare Other | Source: Ambulatory Visit | Attending: Oncology | Admitting: Oncology

## 2014-04-25 DIAGNOSIS — C569 Malignant neoplasm of unspecified ovary: Secondary | ICD-10-CM

## 2014-04-25 DIAGNOSIS — Z5111 Encounter for antineoplastic chemotherapy: Secondary | ICD-10-CM | POA: Diagnosis not present

## 2014-04-25 DIAGNOSIS — Z08 Encounter for follow-up examination after completed treatment for malignant neoplasm: Secondary | ICD-10-CM

## 2014-04-25 DIAGNOSIS — Z87891 Personal history of nicotine dependence: Secondary | ICD-10-CM | POA: Diagnosis not present

## 2014-04-25 DIAGNOSIS — Z79899 Other long term (current) drug therapy: Secondary | ICD-10-CM

## 2014-04-25 DIAGNOSIS — E119 Type 2 diabetes mellitus without complications: Secondary | ICD-10-CM | POA: Diagnosis not present

## 2014-04-25 NOTE — Telephone Encounter (Signed)
none

## 2014-04-25 NOTE — Progress Notes (Signed)
  Echocardiogram 2D Echocardiogram has been performed.  Regina West FRANCES 04/25/2014, 12:07 PM

## 2014-04-28 ENCOUNTER — Ambulatory Visit (HOSPITAL_BASED_OUTPATIENT_CLINIC_OR_DEPARTMENT_OTHER): Payer: Medicare Other

## 2014-04-28 ENCOUNTER — Encounter: Payer: Self-pay | Admitting: *Deleted

## 2014-04-28 DIAGNOSIS — C482 Malignant neoplasm of peritoneum, unspecified: Secondary | ICD-10-CM

## 2014-04-28 DIAGNOSIS — Z5111 Encounter for antineoplastic chemotherapy: Secondary | ICD-10-CM

## 2014-04-28 MED ORDER — HEPARIN SOD (PORK) LOCK FLUSH 100 UNIT/ML IV SOLN
500.0000 [IU] | Freq: Once | INTRAVENOUS | Status: AC | PRN
  Administered 2014-04-28: 500 [IU]
  Filled 2014-04-28: qty 5

## 2014-04-28 MED ORDER — ONDANSETRON 8 MG/NS 50 ML IVPB
INTRAVENOUS | Status: AC
  Filled 2014-04-28: qty 8

## 2014-04-28 MED ORDER — ONDANSETRON 8 MG/50ML IVPB (CHCC)
8.0000 mg | Freq: Once | INTRAVENOUS | Status: AC
  Administered 2014-04-28: 8 mg via INTRAVENOUS

## 2014-04-28 MED ORDER — SODIUM CHLORIDE 0.9 % IV SOLN
Freq: Once | INTRAVENOUS | Status: AC
  Administered 2014-04-28: 14:00:00 via INTRAVENOUS

## 2014-04-28 MED ORDER — DEXAMETHASONE SODIUM PHOSPHATE 10 MG/ML IJ SOLN
INTRAMUSCULAR | Status: AC
  Filled 2014-04-28: qty 1

## 2014-04-28 MED ORDER — DOXORUBICIN HCL LIPOSOMAL CHEMO INJECTION 2 MG/ML
40.0000 mg/m2 | Freq: Once | INTRAVENOUS | Status: AC
  Administered 2014-04-28: 74 mg via INTRAVENOUS
  Filled 2014-04-28: qty 37

## 2014-04-28 MED ORDER — SODIUM CHLORIDE 0.9 % IJ SOLN
10.0000 mL | INTRAMUSCULAR | Status: DC | PRN
  Administered 2014-04-28: 10 mL
  Filled 2014-04-28: qty 10

## 2014-04-28 MED ORDER — DEXAMETHASONE SODIUM PHOSPHATE 10 MG/ML IJ SOLN
10.0000 mg | Freq: Once | INTRAMUSCULAR | Status: AC
  Administered 2014-04-28: 10 mg via INTRAVENOUS

## 2014-04-28 NOTE — Patient Instructions (Signed)
[facility] Discharge Instructions for Patients Receiving Chemotherapy  Today you received the following chemotherapy agents Doxil.  To help prevent nausea and vomiting after your treatment, we encourage you to take your nausea medication as prescribed.   If you develop nausea and vomiting that is not controlled by your nausea medication, call the clinic.   BELOW ARE SYMPTOMS THAT SHOULD BE REPORTED IMMEDIATELY:  *FEVER GREATER THAN 100.5 F  *CHILLS WITH OR WITHOUT FEVER  NAUSEA AND VOMITING THAT IS NOT CONTROLLED WITH YOUR NAUSEA MEDICATION  *UNUSUAL SHORTNESS OF BREATH  *UNUSUAL BRUISING OR BLEEDING  TENDERNESS IN MOUTH AND THROAT WITH OR WITHOUT PRESENCE OF ULCERS  *URINARY PROBLEMS  *BOWEL PROBLEMS  UNUSUAL RASH Items with * indicate a potential emergency and should be followed up as soon as possible.  Feel free to call the clinic you have any questions or concerns. The clinic phone number is [phone].

## 2014-04-28 NOTE — Progress Notes (Signed)
04/28/14 @ 10:00 am:  Regina West's daughter Regina West called this morning asking whether her mother should continue her the post-chemo regimen of decadron and  Compazine that she has utilized after her previous chemo regimens.  Spoke with Dr. Jana Hakim who said that she should continue the Compazine, but it is not necessary to continue the decadron.  Will inform patient through her infusion nurse when she comes in for Doxil this afternoon.

## 2014-04-29 ENCOUNTER — Telehealth: Payer: Self-pay | Admitting: *Deleted

## 2014-04-29 ENCOUNTER — Telehealth: Payer: Self-pay

## 2014-04-29 NOTE — Telephone Encounter (Signed)
Message received from Socorro stating request to return call due to "scheduling conflict with one of Madailein's appointments "  Return call number left as [phone].  This RN returned call and obtained identified VM-  Message left to return call to discuss above.

## 2014-04-29 NOTE — Telephone Encounter (Signed)
-----   Message from Renford Dills, RN sent at 04/28/2014  2:48 PM EST ----- Regarding: chemo follow-up call 1st Doxil  Dr. Jana Hakim  [phone]

## 2014-04-29 NOTE — Telephone Encounter (Signed)
This RN spoke with pt per her VM stating onset of diarrhea post taking 2 Senakot after chemo yesterday.  Ana states at 3pm she has had " at least 8 stools that are loose and mucousy ".  " I wasn't sure if it was the chemo and the Senakot or a bug that has been going around "  She is drinking well and other then above has no other complaints.  This RN discussed above with pt being more caused by chemo and use of 2 Senakot.  Plan at present is for pt to maintain good hydration and follow a BRAT diet ( discussed ) .  Charise will call this RN in AM with update.

## 2014-04-29 NOTE — Telephone Encounter (Signed)
lvm we are f/u on chemo yesterday. Bowel bladder eating drinking any pains or problems call us.

## 2014-04-30 ENCOUNTER — Telehealth: Payer: Self-pay | Admitting: *Deleted

## 2014-04-30 NOTE — Telephone Encounter (Signed)
This RN spoke with pt per her VM stating noted " red cheeks " and "is this a side effect of the chemo"  She is also having a mild headache.  This RN informed pt above is not uncommon post steroid and chemo. Symptom is usually self limiting and patient may apply cool compresses for comfort.  Per discussion Moe states " diarrhea is gone "  No other needs at this time.  This note will be given to MD.

## 2014-05-02 ENCOUNTER — Other Ambulatory Visit: Payer: Self-pay | Admitting: Neurology

## 2014-05-02 ENCOUNTER — Other Ambulatory Visit: Payer: Self-pay | Admitting: Emergency Medicine

## 2014-05-02 ENCOUNTER — Telehealth: Payer: Self-pay | Admitting: *Deleted

## 2014-05-02 NOTE — Telephone Encounter (Signed)
Patient's daughter called to request an appointment change for the lab and MD appointment on 05/14/14.  She is not available to bring her mother to the appointment that day.  Suggested she speak with a scheduler, she said she did that yesterday and was told they could not help her because the appointment was in Dr. Virgie Dad lunch slot. Val attempted to call daughter yesterday, left message for her to call back; unable to assist this morning.

## 2014-05-07 ENCOUNTER — Other Ambulatory Visit: Payer: Self-pay | Admitting: Emergency Medicine

## 2014-05-07 NOTE — Telephone Encounter (Signed)
Spoke with patient's son as his wife Regina West) who is the one who brings Regina West to the appointments was unavailable.  If Regina West is still unable to bring Regina West on 3/09 as scheduled to see Dr Jana Hakim then she can do one of the following:   Per Dr Jana Hakim Regina West can see Dr Jana Hakim on 3/14 at 0800 and then move her lab and injection appointments earlier after the office visit.  Or Regina West on 3/21 prior to chemo appointment.   Patient's son states he will notify his wife of the appointment options and have her call this office back.  I also left a message with Regina West on her cell phone prior to calling her house number.

## 2014-05-08 ENCOUNTER — Telehealth: Payer: Self-pay | Admitting: *Deleted

## 2014-05-08 ENCOUNTER — Telehealth: Payer: Self-pay | Admitting: Oncology

## 2014-05-08 ENCOUNTER — Other Ambulatory Visit: Payer: Self-pay | Admitting: *Deleted

## 2014-05-08 NOTE — Telephone Encounter (Signed)
Daughter in law called to say patient would like to come in on 3/14 at 0800 to see Dr Jana Hakim and then do lab/injection after MD

## 2014-05-08 NOTE — Telephone Encounter (Signed)
Labs/inj moved closer to morning with MD, sent msg to add MD visit @8am  per 03/03 POF, pt is aware of D/T .Marland KitchenMarland Kitchen KJ

## 2014-05-09 ENCOUNTER — Ambulatory Visit (INDEPENDENT_AMBULATORY_CARE_PROVIDER_SITE_OTHER): Payer: Medicare Other | Admitting: Podiatry

## 2014-05-09 DIAGNOSIS — M79676 Pain in unspecified toe(s): Secondary | ICD-10-CM | POA: Diagnosis not present

## 2014-05-09 DIAGNOSIS — B351 Tinea unguium: Secondary | ICD-10-CM

## 2014-05-09 DIAGNOSIS — L600 Ingrowing nail: Secondary | ICD-10-CM

## 2014-05-11 NOTE — Progress Notes (Signed)
Patient ID: Regina West, female   DOB: 09-Jun-1929, 81 y.o.   MRN: [id number]  Subjective: 81 y.o.-year-old female returns the office today for painful, elongated, thickened toenails. She also states that her big toenail feel as if they are ingrowing. Denies any redness or drainage around the nails. Denies any acute changes since last appointment and no new complaints today. Denies any systemic complaints such as fevers, chills, nausea, vomiting.   Objective: AAO 3, NAD DP/PT pulses palpable, CRT less than 3 seconds Protective sensation intact with Simms Weinstein monofilament, Achilles tendon reflex intact.  Nails hypertrophic, dystrophic, elongated, brittle, discolored 10. There is evidence of ingrowing on bilateral hallux nails and to a lesser amount of the lesser digits. There is tenderness overlying these nails 1-5 bilaterally. There is no surrounding erythema or drainage along the nail sites. No open lesions or pre-ulcerative lesions are identified. No other areas of tenderness bilateral lower extremities. No overlying edema, erythema, increased warmth. No pain with calf compression, swelling, warmth, erythema.  Assessment: Patient presents with symptomatic onychomycosis, ingrown toenails  Plan: -Treatment options including alternatives, risks, complications were discussed -Nails sharply debrided 10 without complication/bleeding to patient comfort.  -Discussed daily foot inspection. If there are any changes, to call the office immediately.  -Follow-up in 79 months or sooner if any problems are to arise. In the meantime, encouraged to call the office with any questions, concerns, changes symptoms.

## 2014-05-12 ENCOUNTER — Telehealth: Payer: Self-pay | Admitting: Oncology

## 2014-05-12 NOTE — Telephone Encounter (Signed)
Confirm appoinmtent for 03/14

## 2014-05-13 ENCOUNTER — Ambulatory Visit: Payer: Medicare Other | Admitting: Oncology

## 2014-05-13 ENCOUNTER — Other Ambulatory Visit: Payer: Medicare Other

## 2014-05-14 ENCOUNTER — Other Ambulatory Visit: Payer: Medicare Other

## 2014-05-14 ENCOUNTER — Ambulatory Visit: Payer: Medicare Other | Admitting: Oncology

## 2014-05-19 ENCOUNTER — Ambulatory Visit: Payer: Medicare Other

## 2014-05-19 ENCOUNTER — Other Ambulatory Visit: Payer: Medicare Other

## 2014-05-19 ENCOUNTER — Ambulatory Visit (HOSPITAL_BASED_OUTPATIENT_CLINIC_OR_DEPARTMENT_OTHER): Payer: Medicare Other | Admitting: Oncology

## 2014-05-19 ENCOUNTER — Other Ambulatory Visit (HOSPITAL_BASED_OUTPATIENT_CLINIC_OR_DEPARTMENT_OTHER): Payer: Medicare Other

## 2014-05-19 VITALS — BP 127/59 | HR 86 | Temp 98.1°F | Resp 18 | Ht 65.0 in | Wt 163.8 lb

## 2014-05-19 DIAGNOSIS — D61810 Antineoplastic chemotherapy induced pancytopenia: Secondary | ICD-10-CM

## 2014-05-19 DIAGNOSIS — C801 Malignant (primary) neoplasm, unspecified: Secondary | ICD-10-CM

## 2014-05-19 DIAGNOSIS — D649 Anemia, unspecified: Secondary | ICD-10-CM

## 2014-05-19 DIAGNOSIS — E119 Type 2 diabetes mellitus without complications: Secondary | ICD-10-CM

## 2014-05-19 DIAGNOSIS — R5383 Other fatigue: Secondary | ICD-10-CM

## 2014-05-19 DIAGNOSIS — T451X5A Adverse effect of antineoplastic and immunosuppressive drugs, initial encounter: Secondary | ICD-10-CM

## 2014-05-19 DIAGNOSIS — C482 Malignant neoplasm of peritoneum, unspecified: Secondary | ICD-10-CM | POA: Diagnosis not present

## 2014-05-19 DIAGNOSIS — G63 Polyneuropathy in diseases classified elsewhere: Secondary | ICD-10-CM

## 2014-05-19 DIAGNOSIS — D630 Anemia in neoplastic disease: Secondary | ICD-10-CM

## 2014-05-19 DIAGNOSIS — G3189 Other specified degenerative diseases of nervous system: Secondary | ICD-10-CM

## 2014-05-19 LAB — COMPREHENSIVE METABOLIC PANEL (CC13)
ALT: 19 U/L (ref 0–55)
AST: 18 U/L (ref 5–34)
Albumin: 3.3 g/dL — ABNORMAL LOW (ref 3.5–5.0)
Alkaline Phosphatase: 83 U/L (ref 40–150)
Anion Gap: 9 mEq/L (ref 3–11)
BUN: 21.6 mg/dL (ref 7.0–26.0)
CO2: 25 mEq/L (ref 22–29)
CREATININE: 1.1 mg/dL (ref 0.6–1.1)
Calcium: 9.7 mg/dL (ref 8.4–10.4)
Chloride: 106 mEq/L (ref 98–109)
EGFR: 45 mL/min/{1.73_m2} — ABNORMAL LOW (ref >90)
GLUCOSE: 198 mg/dL — AB (ref 70–140)
Potassium: 4.5 mEq/L (ref 3.5–5.1)
Sodium: 140 mEq/L (ref 136–145)
Total Bilirubin: 0.25 mg/dL (ref 0.20–1.20)
Total Protein: 6 g/dL — ABNORMAL LOW (ref 6.4–8.3)

## 2014-05-19 LAB — CBC WITH DIFFERENTIAL/PLATELET
BASO%: 0.4 % (ref 0.0–2.0)
Basophils Absolute: 0 10*3/uL (ref 0.0–0.1)
EOS%: 2.7 % (ref 0.0–7.0)
Eosinophils Absolute: 0.1 10*3/uL (ref 0.0–0.5)
HCT: 32.8 % — ABNORMAL LOW (ref 34.8–46.6)
HGB: 10.8 g/dL — ABNORMAL LOW (ref 11.6–15.9)
LYMPH%: 10.6 % — AB (ref 14.0–49.7)
MCH: 35 pg — AB (ref 25.1–34.0)
MCHC: 32.9 g/dL (ref 31.5–36.0)
MCV: 106.1 fL — ABNORMAL HIGH (ref 79.5–101.0)
MONO#: 0.5 10*3/uL (ref 0.1–0.9)
MONO%: 20.1 % — ABNORMAL HIGH (ref 0.0–14.0)
NEUT#: 1.8 10*3/uL (ref 1.5–6.5)
NEUT%: 66.2 % (ref 38.4–76.8)
PLATELETS: 122 10*3/uL — AB (ref 145–400)
RBC: 3.09 10*6/uL — AB (ref 3.70–5.45)
RDW: 12.7 % (ref 11.2–14.5)
WBC: 2.6 10*3/uL — AB (ref 3.9–10.3)
lymph#: 0.3 10*3/uL — ABNORMAL LOW (ref 0.9–3.3)

## 2014-05-19 MED ORDER — DARBEPOETIN ALFA 200 MCG/0.4ML IJ SOSY: 200.0000 ug | PREFILLED_SYRINGE | Freq: Once | INTRAMUSCULAR | Status: DC

## 2014-05-19 NOTE — Progress Notes (Signed)
ID: Regina West   DOB: 03-31-1929  MR#: [id number]  DPO#:[id number]  PCP: [name], MD GYN: SU: Stephan Minister, MD OTHER MD: Larey Seat, MD  CHIEF COMPLAINT:  Recurrent ovarian cancer CURRENT TREATMENT: Liposomal doxorubicin  HISTORY OF PRESENT ILLNESS: From the original intake nodes:  The patient originally presented in the summer of 2010 with cramps and abdominal distension.  I do not have a copy of the initial evaluation, but on November 26, 2008, the patient underwent optimal debulking with bilateral salpingo-oophorectomy, omentectomy, and the placement of an intraperitoneal port.  The pathology from that procedure (Accession Number [id number] at [facility]) showed first of all, significant involvement of the omentum, minimal involvement of the right ovary and right fallopian tube and negative on the left ovary and fallopian tube, with neither ovary being enlarged, consistent with a primary peritoneal serous adenocarcinoma, described as moderately differentiated.  The sample included no lymph nodes.    The patient had an intraperitoneal port placed in the same surgery and was treated with intraperitoneal and IV chemotherapy according to GOG-252 but I am not sure which arm she was in.  (Arm 2 did carboplatin intraperitoneally and Taxol IV.  Arm 3 did cisplatin and paclitaxel intraperitoneally with paclitaxel IV.)  All arms received bevacizumab.  Unfortunately, after 4 cycles of treatment, she had acute mental status changes and was admitted here January of 2011 with what proved to be posterior reversible leukoencephalopathy and SIADH.  She had seizures, aphasia, and required intubation.  Once the patient recovered from this, she was treated with 2 cycles of single-agent carboplatin (I do not have the AUC). Her last adjuvant treatment was May 12, 2009, and her CA-125 at that time was 10.3.  Her intraperitoneal port was removed in April of 2011.    More recently, the patient was in routine  follow up when her CA-125 was found to jump up to 2,269.7 (March 26, 2010).  This is 10 months after her last prior chemotherapy.  She had CTs of the chest, abdomen and pelvis January 26th which showed ascites and enhancing peritoneal nodularity. She had had similar findings at presentation but these had completely resolved by the time she finished treatment in March of 2011. The patient was felt to be in first relapse.   Her subsequent history is as detailed below  INTERVAL HISTORY:  Regina West returns today for follow up of her ovarian cancer, accompanied by her daughter and her daughter in law. Today is day 21 cycle 1 of liposomal doxorubicin, which is given every 28 days.  REVIEW OF SYSTEMS:  Regina West . Had no problems with nausea, vomiting, or fatigue after her the first cycle. She thinks her palms turned a little bit red, but they did not by mouth. She did not have mouth sores or other evidence of mucositis. She is having smaller but more frequent bowel movements, perhaps up to 5 times a day. She is having a little bit less urine control. She is doing physical therapy twice a week and she walks"a long way" to get to the dining hall. She thinks her vision is a little bit blurred. She did have some flushing for a day or so after the chemotherapy. A detailed review of systems was otherwise stable.  PAST MEDICAL HISTORY: Significant for hysterectomy at the age of 10 with concurrent bladder "tuck up."  She also underwent appendectomy during that procedure. She is status post prior bilateral cataract surgery. She is status post tonsillectomy and adenoidectomy.  She has  a history of hypertension and diabetes. She has a history of diverticulosis.  She has a large hiatal hernia.  She has degenerative disk disease.  She has been noted to have coronary calcifications and she has hyperlipidemia.  There is a history of remote tobacco abuse. Seizure disorder as per HPI  FAMILY HISTORY The patient's father died at  the age of 75 after heart surgery for aortic stenosis.  The patient's mother died at the age of 49.  The patient has one brother in good health.  There is no other family history to her knowledge of ovarian or breast cancer.    GYNECOLOGIC HISTORY: Menarche at age 71.  She is GX P3 with menopause in her late 27s.  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 She is currently residing in [place]. Her husband was in sales and had a history of Parkinson's disease.  He died following a fall in the year 2000.  The patient's son Regina West lives in [place], and has a history of MS.  Daughter Regina West lives in [place].. She is a Agricultural engineer.  Son Regina West lives in [place], [place] and is in Press photographer. The patient has 8 grandchildren. She attends the [employer].  She is a good friend of our patients J. and Regina West DIRECTIVES: in place  HEALTH MAINTENANCE:  (Updated December 2014) History  Substance Use Topics  . Smoking status: Former Smoker    Quit date: 05/23/1964  . Smokeless tobacco: Never Used  . Alcohol Use: 0.0 oz/week     Comment: rarely     Colonoscopy: 2008  PAP: UTD  MM:  Declines   Allergies  Allergen Reactions  . Avastin [Bevacizumab] Other (See Comments)    Swelling of the brain     Current Outpatient Prescriptions  Medication Sig Dispense Refill  . acyclovir (ZOVIRAX) 400 MG tablet Take 1 tablet (400 mg total) by mouth 2 (two) times daily. 60 tablet 11  . amphetamine-dextroamphetamine (ADDERALL) 5 MG tablet Take 1 tablet by mouth 2 (two) times daily with breakfast and lunch. 60 tablet 0  . antiseptic oral rinse (BIOTENE) LIQD 15 mLs by Mouth Rinse route 2 (two) times daily. 1 Bottle 0  . cholecalciferol (VITAMIN D) 1000 UNITS tablet Take 1,000 Units by mouth 2 (two) times daily.    Marland Kitchen levETIRAcetam (KEPPRA) 250 MG tablet  take 1 tablet by mouth twice a day 60 tablet 1  . lidocaine-prilocaine (EMLA) cream Apply 1 application topically as needed. 30 g 0  . metFORMIN (GLUCOPHAGE) 500 MG tablet   0  . nitrofurantoin (MACRODANTIN) 50 MG capsule TAKE TWO CAPSULES BY MOUTH ONCE DAILY 60 capsule 2  . omeprazole (PRILOSEC) 40 MG capsule Take 1 capsule (40 mg total) by mouth at bedtime. 30 capsule 2  . VIIBRYD 40 MG TABS      No current facility-administered medications for this visit.   Facility-Administered Medications Ordered in Other Visits  Medication Dose Route Frequency Provider Last Rate Last Dose  . sodium chloride 0.9 % injection 3 mL  3 mL Intracatheter PRN Chauncey Cruel, MD        OBJECTIVE: Elderly white woman who appears stated age  81 Vitals:   05/19/14 0818  BP: 127/59  Pulse: 86  Temp: 98.1 F (36.7 C)  Resp: 18     Body mass index  is 27.26 kg/(m^2).    ECOG FS: 1 Filed Weights   05/19/14 0818  Weight: 163 lb 12.8 oz (74.299 kg)    Sclerae unictEOMs intact  Oropharynx clear, no thrush or other lesionservical or supraclavicular adenopathy Lungs no rales or rhonchi Heart regular rate and rhythm Abd soft, nontender,  mildly distended, positive bowel sounds, no masses palpated MSK no focal spinal tenderness Neuro: nonfocal, well-oriented, positive affect Breasts: Deferred    Lab Results  Component Value Date   WBC 4.0 04/14/2014   NEUTROABS 3.1 04/14/2014   HGB 11.7 04/14/2014   HCT 35.8 04/14/2014   MCV 107.9* 04/14/2014   PLT 167 04/14/2014       Chemistry      Component Value Date/Time   NA 136 04/14/2014 1142   NA 136 10/17/2011 1118   NA 143 05/12/2011 1337   K 4.4 04/14/2014 1142   K 3.9 10/17/2011 1118   K 4.1 05/12/2011 1337   CL 100 08/20/2012 1124   CL 100 10/17/2011 1118   CL 92* 05/12/2011 1337   CO2 23 04/14/2014 1142   CO2 27 10/17/2011 1118   CO2 32 05/12/2011 1337   West 24.7 04/14/2014 1142   West 25* 10/17/2011 1118   West 19 05/12/2011 1337    CREATININE 1.3* 04/14/2014 1142   CREATININE 1.10 10/17/2011 1118   CREATININE 1.3* 05/12/2011 1337      Component Value Date/Time   CALCIUM 9.7 04/14/2014 1142   CALCIUM 9.8 10/17/2011 1118   CALCIUM 9.8 05/12/2011 1337   ALKPHOS 97 04/14/2014 1142   ALKPHOS 109 10/17/2011 1118   ALKPHOS 95* 05/12/2011 1337   AST 21 04/14/2014 1142   AST 22 10/17/2011 1118   AST 40* 05/12/2011 1337   ALT 18 04/14/2014 1142   ALT 14 10/17/2011 1118   ALT 28 05/12/2011 1337   BILITOT 0.32 04/14/2014 1142   BILITOT 0.4 10/17/2011 1118   BILITOT 0.60 05/12/2011 1337     Results for AMARISS, DETAMORE (MRN [id number]) as of 04/21/2014 10:18  Ref. Range 12/30/2013 11:23 01/27/2014 11:25 02/24/2014 11:11 03/24/2014 10:48 04/14/2014 11:41  CA 125 Latest Range: <35 U/mL 16 15 19  99 (H) 363 (H)    STUDIES: No results found.   ASSESSMENT: 81 y.o.  [place] woman   (1) status post optimal debulking of a primary peritoneal serous adenocarcinoma September 2010, the tumor being moderately differentiated, pT3c NX (stage IIIC), treated according to GOG 252 with  intraperitoneal platinum and paclitaxel x4 given along with bevacizumab, complicated by SIADH after cycle 4, also with posterior reversible leukoencephalopathy.  After these problems resolved she received 2 additional cycles of single-agent carboplatin completed in March 2011  (2)  She had her first recurrence February 2012  treated with carboplatin and Gemzar for a total of 6 cycles between February and June 2012.  Her CEA-125 had normalized before the beginning of cycle 5.   (3) second recurrence documented February 2013,  treated with carboplatin/ doxil x 6, completed 10/03/2011, with a good response noted on restaging scans  (4) third recurrence documented February 2014, receiving single-agent carboplatin every 3 weeks, first dose 04/30/2012, completed 08/20/2012  (5) fourth recurrence documented by abdominal symptoms and a rising CA 125 November  2014, receiving Abraxane, initially on days one and 8 of each 21 day cycle, with first dose given on 01/16/2013; starting 04/22/2013 she received the Abraxane every 2 weeks--rising CA 125 noted 05/31/2013  (6) started cyclophosphamide/ with "low dose" carboplatin 06/17/2013,  repeated  every 21 days x8 doses, completed 12/09/13, with a rising CA 125 noted 3 months after discontinuation of treatment  (7) fifth recurrence February 2016, started liposomal doxorybicin 04/28/2014  PLAN:  Judit  tolerated her first cycle of liposomal doxorubicin well. She had some trouble with the steroids, and I have dropped that dose. I have asked her not to take steroids as antinausea medicine. She will use Compazine instead.  I have encouraged her to continue her physical therapy and I wrote a note so that she will not lose that opportunity.  She will return next week for her second cycle and I have added a visit with her third and fourth cycles by cycle 4 we are not to know, following the CEA 125, whether or not she is having a response. At this point I am encouraged that there has been a little bit of weight loss (about 2 pounds compared to last month) which indicates she is not picking up fluid.  Brice has a good understanding of the overall plan. She agrees with it. She knows to call for any problems that may develop before her next visit here. Chauncey Cruel, MD  Chauncey Cruel, MD

## 2014-05-20 LAB — CA 125: CA 125: 961 U/mL — ABNORMAL HIGH (ref <35)

## 2014-05-23 ENCOUNTER — Ambulatory Visit: Payer: Medicare Other | Admitting: Oncology

## 2014-05-26 ENCOUNTER — Ambulatory Visit (HOSPITAL_BASED_OUTPATIENT_CLINIC_OR_DEPARTMENT_OTHER): Payer: Medicare Other

## 2014-05-26 ENCOUNTER — Telehealth: Payer: Self-pay | Admitting: *Deleted

## 2014-05-26 DIAGNOSIS — C482 Malignant neoplasm of peritoneum, unspecified: Secondary | ICD-10-CM

## 2014-05-26 DIAGNOSIS — Z5111 Encounter for antineoplastic chemotherapy: Secondary | ICD-10-CM

## 2014-05-26 MED ORDER — SODIUM CHLORIDE 0.9 % IJ SOLN
10.0000 mL | INTRAMUSCULAR | Status: DC | PRN
  Administered 2014-05-26: 10 mL
  Filled 2014-05-26: qty 10

## 2014-05-26 MED ORDER — SODIUM CHLORIDE 0.9 % IV SOLN
Freq: Once | INTRAVENOUS | Status: AC
  Administered 2014-05-26: 12:00:00 via INTRAVENOUS

## 2014-05-26 MED ORDER — HEPARIN SOD (PORK) LOCK FLUSH 100 UNIT/ML IV SOLN
500.0000 [IU] | Freq: Once | INTRAVENOUS | Status: AC | PRN
  Administered 2014-05-26: 500 [IU]
  Filled 2014-05-26: qty 5

## 2014-05-26 MED ORDER — SODIUM CHLORIDE 0.9 % IV SOLN
Freq: Once | INTRAVENOUS | Status: AC
  Administered 2014-05-26: 12:00:00 via INTRAVENOUS
  Filled 2014-05-26: qty 4

## 2014-05-26 MED ORDER — DEXTROSE 5 % IV SOLN
38.0000 mg/m2 | Freq: Once | INTRAVENOUS | Status: AC
  Administered 2014-05-26: 70 mg via INTRAVENOUS
  Filled 2014-05-26: qty 35

## 2014-05-26 MED ORDER — DOXORUBICIN HCL LIPOSOMAL CHEMO INJECTION 2 MG/ML: 40.0000 mg/m2 | Freq: Once | INTRAVENOUS | Status: DC

## 2014-05-26 NOTE — Patient Instructions (Signed)
[facility] Discharge Instructions for Patients Receiving Chemotherapy  Today you received the following chemotherapy agents doxil  To help prevent nausea and vomiting after your treatment, we encourage you to take your nausea medication as directed. If you develop nausea and vomiting that is not controlled by your nausea medication, call the clinic.   BELOW ARE SYMPTOMS THAT SHOULD BE REPORTED IMMEDIATELY:  *FEVER GREATER THAN 100.5 F  *CHILLS WITH OR WITHOUT FEVER  NAUSEA AND VOMITING THAT IS NOT CONTROLLED WITH YOUR NAUSEA MEDICATION  *UNUSUAL SHORTNESS OF BREATH  *UNUSUAL BRUISING OR BLEEDING  TENDERNESS IN MOUTH AND THROAT WITH OR WITHOUT PRESENCE OF ULCERS  *URINARY PROBLEMS  *BOWEL PROBLEMS  UNUSUAL RASH Items with * indicate a potential emergency and should be followed up as soon as possible.  Feel free to call the clinic you have any questions or concerns. The clinic phone number is [phone].  

## 2014-05-26 NOTE — Progress Notes (Signed)
Ok to proceed with treatment using labs from 3/14 per Dr Jana Hakim

## 2014-05-26 NOTE — Telephone Encounter (Signed)
Family called asking if patient needs approval to go to the dentist.  May need to have work done on teeth.  Reviewed dental policy to have all work done before chemotherapy begins.  Received second Doxil today.  On a 21-day cycle.  Will need to co-ordinate procedure with chemotherapy so blood counts are optimal and no bleeding and infection risk.  Will meet with dentist and call back when they have more information.

## 2014-06-16 ENCOUNTER — Other Ambulatory Visit: Payer: Medicare Other

## 2014-06-16 ENCOUNTER — Ambulatory Visit: Payer: Medicare Other | Admitting: Nurse Practitioner

## 2014-06-16 ENCOUNTER — Ambulatory Visit: Payer: Medicare Other

## 2014-06-16 ENCOUNTER — Ambulatory Visit (HOSPITAL_BASED_OUTPATIENT_CLINIC_OR_DEPARTMENT_OTHER): Payer: Medicare Other | Admitting: Nurse Practitioner

## 2014-06-16 ENCOUNTER — Ambulatory Visit (HOSPITAL_BASED_OUTPATIENT_CLINIC_OR_DEPARTMENT_OTHER): Payer: Medicare Other

## 2014-06-16 ENCOUNTER — Encounter: Payer: Self-pay | Admitting: Nurse Practitioner

## 2014-06-16 ENCOUNTER — Other Ambulatory Visit (HOSPITAL_BASED_OUTPATIENT_CLINIC_OR_DEPARTMENT_OTHER): Payer: Medicare Other

## 2014-06-16 VITALS — BP 111/54 | HR 88 | Temp 97.8°F | Wt 163.9 lb

## 2014-06-16 VITALS — BP 111/54 | HR 88 | Temp 97.8°F | Resp 18 | Ht 65.0 in | Wt 164.9 lb

## 2014-06-16 DIAGNOSIS — T451X5A Adverse effect of antineoplastic and immunosuppressive drugs, initial encounter: Secondary | ICD-10-CM

## 2014-06-16 DIAGNOSIS — Z95828 Presence of other vascular implants and grafts: Secondary | ICD-10-CM

## 2014-06-16 DIAGNOSIS — D649 Anemia, unspecified: Secondary | ICD-10-CM

## 2014-06-16 DIAGNOSIS — C482 Malignant neoplasm of peritoneum, unspecified: Secondary | ICD-10-CM

## 2014-06-16 DIAGNOSIS — L271 Localized skin eruption due to drugs and medicaments taken internally: Secondary | ICD-10-CM | POA: Diagnosis not present

## 2014-06-16 DIAGNOSIS — I10 Essential (primary) hypertension: Secondary | ICD-10-CM | POA: Diagnosis not present

## 2014-06-16 DIAGNOSIS — E119 Type 2 diabetes mellitus without complications: Secondary | ICD-10-CM

## 2014-06-16 DIAGNOSIS — D61810 Antineoplastic chemotherapy induced pancytopenia: Secondary | ICD-10-CM

## 2014-06-16 DIAGNOSIS — R5383 Other fatigue: Secondary | ICD-10-CM

## 2014-06-16 DIAGNOSIS — D630 Anemia in neoplastic disease: Secondary | ICD-10-CM

## 2014-06-16 LAB — CBC WITH DIFFERENTIAL/PLATELET
BASO%: 0.4 % (ref 0.0–2.0)
BASOS ABS: 0 10*3/uL (ref 0.0–0.1)
EOS%: 1.1 % (ref 0.0–7.0)
Eosinophils Absolute: 0 10*3/uL (ref 0.0–0.5)
HEMATOCRIT: 29.6 % — AB (ref 34.8–46.6)
HGB: 10.1 g/dL — ABNORMAL LOW (ref 11.6–15.9)
LYMPH#: 0.3 10*3/uL — AB (ref 0.9–3.3)
LYMPH%: 7.1 % — ABNORMAL LOW (ref 14.0–49.7)
MCH: 35.4 pg — ABNORMAL HIGH (ref 25.1–34.0)
MCHC: 34.1 g/dL (ref 31.5–36.0)
MCV: 103.6 fL — ABNORMAL HIGH (ref 79.5–101.0)
MONO#: 0.7 10*3/uL (ref 0.1–0.9)
MONO%: 17.6 % — ABNORMAL HIGH (ref 0.0–14.0)
NEUT#: 2.8 10*3/uL (ref 1.5–6.5)
NEUT%: 73.8 % (ref 38.4–76.8)
Platelets: 243 10*3/uL (ref 145–400)
RBC: 2.86 10*6/uL — ABNORMAL LOW (ref 3.70–5.45)
RDW: 13.3 % (ref 11.2–14.5)
WBC: 3.7 10*3/uL — AB (ref 3.9–10.3)

## 2014-06-16 LAB — COMPREHENSIVE METABOLIC PANEL (CC13)
ALK PHOS: 110 U/L (ref 40–150)
ALT: 12 U/L (ref 0–55)
AST: 17 U/L (ref 5–34)
Albumin: 3.1 g/dL — ABNORMAL LOW (ref 3.5–5.0)
Anion Gap: 11 mEq/L (ref 3–11)
BILIRUBIN TOTAL: 0.54 mg/dL (ref 0.20–1.20)
BUN: 18 mg/dL (ref 7.0–26.0)
CO2: 23 mEq/L (ref 22–29)
Calcium: 9.9 mg/dL (ref 8.4–10.4)
Chloride: 103 mEq/L (ref 98–109)
Creatinine: 1.2 mg/dL — ABNORMAL HIGH (ref 0.6–1.1)
EGFR: 43 mL/min/{1.73_m2} — ABNORMAL LOW (ref >90)
Glucose: 191 mg/dl — ABNORMAL HIGH (ref 70–140)
POTASSIUM: 4.2 meq/L (ref 3.5–5.1)
Sodium: 138 mEq/L (ref 136–145)
Total Protein: 6.2 g/dL — ABNORMAL LOW (ref 6.4–8.3)

## 2014-06-16 MED ORDER — DARBEPOETIN ALFA 200 MCG/0.4ML IJ SOSY
200.0000 ug | PREFILLED_SYRINGE | Freq: Once | INTRAMUSCULAR | Status: DC
  Administered 2014-06-16: 200 ug via SUBCUTANEOUS

## 2014-06-16 MED ORDER — HEPARIN SOD (PORK) LOCK FLUSH 100 UNIT/ML IV SOLN
500.0000 [IU] | Freq: Once | INTRAVENOUS | Status: AC
  Administered 2014-06-16: 500 [IU] via INTRAVENOUS
  Filled 2014-06-16: qty 5

## 2014-06-16 MED ORDER — DARBEPOETIN ALFA 200 MCG/0.4ML IJ SOSY
200.0000 ug | PREFILLED_SYRINGE | Freq: Once | INTRAMUSCULAR | Status: DC
  Filled 2014-06-16: qty 0.4

## 2014-06-16 MED ORDER — SODIUM CHLORIDE 0.9 % IJ SOLN
10.0000 mL | INTRAMUSCULAR | Status: DC | PRN
Start: 2014-06-16 — End: 2014-06-16
  Administered 2014-06-16: 10 mL via INTRAVENOUS
  Filled 2014-06-16: qty 10

## 2014-06-16 NOTE — Progress Notes (Signed)
ID: Regina West   DOB: 25-Sep-1929  MR#: [id number]  QVZ#:[id number]  PCP: [name], MD GYN: SU: Stephan Minister, MD OTHER MD: Larey Seat, MD  CHIEF COMPLAINT:  Recurrent ovarian cancer CURRENT TREATMENT: Liposomal doxorubicin  HISTORY OF PRESENT ILLNESS: From the original intake nodes:  The patient originally presented in the summer of 2010 with cramps and abdominal distension.  I do not have a copy of the initial evaluation, but on November 26, 2008, the patient underwent optimal debulking with bilateral salpingo-oophorectomy, omentectomy, and the placement of an intraperitoneal port.  The pathology from that procedure (Accession Number [id number] at [facility]) showed first of all, significant involvement of the omentum, minimal involvement of the right ovary and right fallopian tube and negative on the left ovary and fallopian tube, with neither ovary being enlarged, consistent with a primary peritoneal serous adenocarcinoma, described as moderately differentiated.  The sample included no lymph nodes.    The patient had an intraperitoneal port placed in the same surgery and was treated with intraperitoneal and IV chemotherapy according to GOG-252 but I am not sure which arm she was in.  (Arm 2 did carboplatin intraperitoneally and Taxol IV.  Arm 3 did cisplatin and paclitaxel intraperitoneally with paclitaxel IV.)  All arms received bevacizumab.  Unfortunately, after 4 cycles of treatment, she had acute mental status changes and was admitted here January of 2011 with what proved to be posterior reversible leukoencephalopathy and SIADH.  She had seizures, aphasia, and required intubation.  Once the patient recovered from this, she was treated with 2 cycles of single-agent carboplatin (I do not have the AUC). Her last adjuvant treatment was May 12, 2009, and her CA-125 at that time was 10.3.  Her intraperitoneal port was removed in April of 2011.    More recently, the patient was in routine  follow up when her CA-125 was found to jump up to 2,269.7 (March 26, 2010).  This is 10 months after her last prior chemotherapy.  She had CTs of the chest, abdomen and pelvis January 26th which showed ascites and enhancing peritoneal nodularity. She had had similar findings at presentation but these had completely resolved by the time she finished treatment in March of 2011. The patient was felt to be in first relapse.   Her subsequent history is as detailed below  INTERVAL HISTORY:  Regina West returns today for follow up of her ovarian cancer, accompanied by her daughter. Today is day 22, cycle 2 of liposomal doxorubicin, which is given every 28 days. Regina West's palmar rash is worse than what was seen at her last visit 1 month ago. It is certainly more red and dry. There is some peeling where the finger meets the palms on both hands. She says they do not hurt so much as they are uncomfortably tight and dry. Her soles are barely affected. She denies mouth sores or other rashes.   REVIEW OF SYSTEMS:  Regina West denies fevers, chills, nausea, or vomiting. She has loose stools for a time after treatment, but this resolves on its own. Her main complaint is fatigue and not feeling like herself. She is weak at times despite working with physical therapy a few times a week. She takes a tylenol PM for sleep. She has shortness of breath with exertion, but denies chest pain, cough, or palpitations. A detailed review of systems is otherwise stable.  PAST MEDICAL HISTORY: Significant for hysterectomy at the age of 82 with concurrent bladder "tuck up."  She also underwent appendectomy during  that procedure. She is status post prior bilateral cataract surgery. She is status post tonsillectomy and adenoidectomy.  She has a history of hypertension and diabetes. She has a history of diverticulosis.  She has a large hiatal hernia.  She has degenerative disk disease.  She has been noted to have coronary calcifications and she has  hyperlipidemia.  There is a history of remote tobacco abuse. Seizure disorder as per HPI  FAMILY HISTORY The patient's father died at the age of 41 after heart surgery for aortic stenosis.  The patient's mother died at the age of 48.  The patient has one brother in good health.  There is no other family history to her knowledge of ovarian or breast cancer.    GYNECOLOGIC HISTORY: Menarche at age 54.  She is GX P3 with menopause in her late 72s.  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 She is currently residing in [place]. Her husband was in sales and had a history of Parkinson's disease.  He died following a fall in the year 2000.  The patient's son Regina West lives in [place], and has a history of MS.  Daughter Regina West lives in [place].. She is a Agricultural engineer.  Son Regina West lives in [place], [place] and is in Press photographer. The patient has 8 grandchildren. She attends the [employer].  She is a good friend of our patients J. and [place] DIRECTIVES: in place  HEALTH MAINTENANCE:  (Updated December 2014) History  Substance Use Topics  . Smoking status: Former Smoker    Quit date: 05/23/1964  . Smokeless tobacco: Never Used  . Alcohol Use: 0.0 oz/week     Comment: rarely     Colonoscopy: 2008  PAP: UTD  MM:  Declines   Allergies  Allergen Reactions  . Avastin [Bevacizumab] Other (See Comments)    Swelling of the brain     Current Outpatient Prescriptions  Medication Sig Dispense Refill  . acyclovir (ZOVIRAX) 400 MG tablet Take 1 tablet (400 mg total) by mouth 2 (two) times daily. 60 tablet 11  . antiseptic oral rinse (BIOTENE) LIQD 15 mLs by Mouth Rinse route 2 (two) times daily. 1 Bottle 0  . cholecalciferol (VITAMIN D) 1000 UNITS tablet Take 1,000 Units by mouth 2 (two) times daily.    Marland Kitchen levETIRAcetam (KEPPRA) 250 MG tablet  take 1 tablet by mouth twice a day 60 tablet 1  . lidocaine-prilocaine (EMLA) cream Apply 1 application topically as needed. 30 g 0  . metFORMIN (GLUCOPHAGE) 500 MG tablet   0  . nitrofurantoin (MACRODANTIN) 50 MG capsule TAKE TWO CAPSULES BY MOUTH ONCE DAILY 60 capsule 2  . omeprazole (PRILOSEC) 40 MG capsule Take 1 capsule (40 mg total) by mouth at bedtime. 30 capsule 2  . VIIBRYD 40 MG TABS      No current facility-administered medications for this visit.   Facility-Administered Medications Ordered in Other Visits  Medication Dose Route Frequency Provider Last Rate Last Dose  . Darbepoetin Alfa (ARANESP) injection 200 mcg  200 mcg Subcutaneous Once Chauncey Cruel, MD      . sodium chloride 0.9 % injection 10 mL  10 mL Intravenous PRN Chauncey Cruel, MD   10 mL at 06/16/14 1023  . sodium chloride 0.9 % injection 3 mL  3 mL Intracatheter PRN Chauncey Cruel, MD  OBJECTIVE: Elderly white woman who appears stated age  40 Vitals:   06/16/14 1038  BP: 111/54  Pulse: 88  Temp: 97.8 F (36.6 C)  Resp: 18     Body mass index is 27.44 kg/(m^2).    ECOG FS: 1 Filed Weights   06/16/14 1038  Weight: 164 lb 14 oz (74.787 kg)    Skin: palms as pictured below HEENT: sclerae anicteric, conjunctivae pink, oropharynx clear. No thrush or mucositis.  Lymph Nodes: No cervical or supraclavicular lymphadenopathy  Lungs: clear to auscultation bilaterally, no rales, wheezes, or rhonci  Heart: regular rate and rhythm  Abdomen: round, soft, non tender, positive bowel sounds  Musculoskeletal: No focal spinal tenderness, no peripheral edema  Neuro: non focal, well oriented, positive affect  Breasts: deferred       Lab Results  Component Value Date   WBC 3.7* 06/16/2014   NEUTROABS 2.8 06/16/2014   HGB 10.1* 06/16/2014   HCT 29.6* 06/16/2014   MCV 103.6* 06/16/2014   PLT 243 06/16/2014       Chemistry      Component Value Date/Time   NA 138 06/16/2014 1012   NA 136  10/17/2011 1118   NA 143 05/12/2011 1337   K 4.2 06/16/2014 1012   K 3.9 10/17/2011 1118   K 4.1 05/12/2011 1337   CL 100 08/20/2012 1124   CL 100 10/17/2011 1118   CL 92* 05/12/2011 1337   CO2 23 06/16/2014 1012   CO2 27 10/17/2011 1118   CO2 32 05/12/2011 1337   West 18.0 06/16/2014 1012   West 25* 10/17/2011 1118   West 19 05/12/2011 1337   CREATININE 1.2* 06/16/2014 1012   CREATININE 1.10 10/17/2011 1118   CREATININE 1.3* 05/12/2011 1337      Component Value Date/Time   CALCIUM 9.9 06/16/2014 1012   CALCIUM 9.8 10/17/2011 1118   CALCIUM 9.8 05/12/2011 1337   ALKPHOS 110 06/16/2014 1012   ALKPHOS 109 10/17/2011 1118   ALKPHOS 95* 05/12/2011 1337   AST 17 06/16/2014 1012   AST 22 10/17/2011 1118   AST 40* 05/12/2011 1337   ALT 12 06/16/2014 1012   ALT 14 10/17/2011 1118   ALT 28 05/12/2011 1337   BILITOT 0.54 06/16/2014 1012   BILITOT 0.4 10/17/2011 1118   BILITOT 0.60 05/12/2011 1337     Results for Regina West, Regina West (MRN [id number]) as of 04/21/2014 10:18  Ref. Range 12/30/2013 11:23 01/27/2014 11:25 02/24/2014 11:11 03/24/2014 10:48 04/14/2014 11:41  CA 125 Latest Range: <35 U/mL 16 15 19  99 (H) 363 (H)    STUDIES: No results found.   ASSESSMENT: 81 y.o.  [employer] woman   (1) status post optimal debulking of a primary peritoneal serous adenocarcinoma September 2010, the tumor being moderately differentiated, pT3c NX (stage IIIC), treated according to GOG 252 with  intraperitoneal platinum and paclitaxel x4 given along with bevacizumab, complicated by SIADH after cycle 4, also with posterior reversible leukoencephalopathy.  After these problems resolved she received 2 additional cycles of single-agent carboplatin completed in March 2011  (2)  She had her first recurrence February 2012  treated with carboplatin and Gemzar for a total of 6 cycles between February and June 2012.  Her CEA-125 had normalized before the beginning of cycle 5.   (3) second recurrence  documented February 2013,  treated with carboplatin/ doxil x 6, completed 10/03/2011, with a good response noted on restaging scans  (4) third recurrence documented February 2014, receiving single-agent carboplatin every 3 weeks, first  dose 04/30/2012, completed 08/20/2012  (5) fourth recurrence documented by abdominal symptoms and a rising CA 125 November 2014, receiving Abraxane, initially on days one and 8 of each 21 day cycle, with first dose given on 01/16/2013; starting 04/22/2013 she received the Abraxane every 2 weeks--rising CA 125 noted 05/31/2013  (6) started cyclophosphamide/ with "low dose" carboplatin 06/17/2013,  repeated every 21 days x8 doses, completed 12/09/13, with a rising CA 125 noted 3 months after discontinuation of treatment  (7) fifth recurrence February 2016, started liposomal doxorybicin 04/28/2014  PLAN:  The labs were reviewed in detail and her hgb is down to 10.1 this week. Although this is borderline, I am having her proceed with her aranesp injection today anyway. This is the first time she has been below 10.5 since October, which is when she received her last injection. Hopefully this boost will provide the energy and strength she needs to fully recover before starting her next treatment.   I consulted with Dr. Jana Hakim regarding her palmar rash and whether a dose reduction was needed in the doxil. We will wait until her CA 125 return tomorrow to make a decision. It may not be necessary if these numbers continue to rise. In  The meantime I have provided her some samples of aquaphor, and advised that she keep her hands well lubricated at all times. She will alert [place] if this symptom continues.   Danett will return on Monday for labs and potentially cycle 3 of treatment. Her next office visit will be in 4 weeks. She understands and agrees with this plan. She has been encouraged to call with any issues that might arise before her next visit here.   Laurie Panda,  NP

## 2014-06-16 NOTE — Patient Instructions (Signed)

## 2014-06-17 ENCOUNTER — Other Ambulatory Visit: Payer: Self-pay | Admitting: Nurse Practitioner

## 2014-06-17 LAB — CA 125: CA 125: 1375 U/mL — ABNORMAL HIGH (ref <35)

## 2014-06-18 ENCOUNTER — Telehealth: Payer: Self-pay | Admitting: *Deleted

## 2014-06-18 ENCOUNTER — Other Ambulatory Visit: Payer: Self-pay | Admitting: *Deleted

## 2014-06-20 ENCOUNTER — Telehealth: Payer: Self-pay | Admitting: *Deleted

## 2014-06-20 NOTE — Telephone Encounter (Addendum)
PAIN STARTED EARLY THIS MORNING. IT IS A CONSTANT DULL ACHE AT A SCALE OF THREE TO FOUR. PT. IS EATING AND DRINKING. SHE HAS NAUSEA BUT NO VOMITING. PT. IS PASSING A LITTLE GAS BUT BELCHING A LOT. PT. DOES NOT KNOW WHEN SHE LAST HAD A GOOD BOWEL MOVEMENT. PT. TRIED TO DISIMPACT HERSELF WITH SOME HARD BALLS AND SOFT STOOL. VERBAL ORDER AND READ BACK TO CYNDEE BACON,NP- PT. TO TAKE A DOSE OF MIRALAX EVERY FOUR HOURS WHILE AWAKE UNTIL SHE IS HAVING LIQUID STOOLS. NOTIFIED PT. OF THE ABOVE AND ALSO INSTRUCTED PT. TO FORCE FLUIDS TO AT LEAST 64 OUNCES IN A 24 HOURS PERIOD. IF PT. SHOULD HAVING ANY VOMITING INSTRUCTED HER TO GO TO THE EMERGENCY DEPARTMENT FOR EVALUATION. PT. WILL HAVE SOMEONE TO BRING HER THE MIRALAX AND START USING SOON.

## 2014-06-22 NOTE — Progress Notes (Signed)
ID: Regina West   DOB: 1929-06-29  MR#: [id number]  HER#:[id number]  PCP: [name], MD GYN: SU: Stephan Minister, MD OTHER MD: Larey Seat, MD  CHIEF COMPLAINT:  Recurrent ovarian cancer CURRENT TREATMENT: Liposomal doxorubicin  HISTORY OF PRESENT ILLNESS: From the original intake nodes:  The patient originally presented in the summer of 2010 with cramps and abdominal distension.  I do not have a copy of the initial evaluation, but on November 26, 2008, the patient underwent optimal debulking with bilateral salpingo-oophorectomy, omentectomy, and the placement of an intraperitoneal port.  The pathology from that procedure (Accession Number [id number] at [facility]) showed first of all, significant involvement of the omentum, minimal involvement of the right ovary and right fallopian tube and negative on the left ovary and fallopian tube, with neither ovary being enlarged, consistent with a primary peritoneal serous adenocarcinoma, described as moderately differentiated.  The sample included no lymph nodes.    The patient had an intraperitoneal port placed in the same surgery and was treated with intraperitoneal and IV chemotherapy according to GOG-252 but I am not sure which arm she was in.  (Arm 2 did carboplatin intraperitoneally and Taxol IV.  Arm 3 did cisplatin and paclitaxel intraperitoneally with paclitaxel IV.)  All arms received bevacizumab.  Unfortunately, after 4 cycles of treatment, she had acute mental status changes and was admitted here January of 2011 with what proved to be posterior reversible leukoencephalopathy and SIADH.  She had seizures, aphasia, and required intubation.  Once the patient recovered from this, she was treated with 2 cycles of single-agent carboplatin (I do not have the AUC). Her last adjuvant treatment was May 12, 2009, and her CA-125 at that time was 10.3.  Her intraperitoneal port was removed in April of 2011.    More recently, the patient was in routine  follow up when her CA-125 was found to jump up to 2,269.7 (March 26, 2010).  This is 10 months after her last prior chemotherapy.  She had CTs of the chest, abdomen and pelvis January 26th which showed ascites and enhancing peritoneal nodularity. She had had similar findings at presentation but these had completely resolved by the time she finished treatment in March of 2011. The patient was felt to be in first relapse.   Her subsequent history is as detailed below  INTERVAL HISTORY:  Ailyne returns today for follow up of her ovarian cancer, accompanied by her son Regina West, her daughter-in-law Regina West, and (by long-distance telephone) her daughter Regina West, in [place]. Regina West has received 2 cycles of liposomal doxorubicin. It has caused significant palmar plantar erythrodysesthesia. It also has had absolutely no effect on her CEA 125, which continues to rise steeply.  REVIEW OF SYSTEMS:  Shatha has felt very weak for the last couple of days. She has not been eating much at all for the last 4 or 5 days. She has had some nausea, but no vomiting. She has had a little bit of water brash. She has had some belching. She doesn't think she is drinking as much as a quart of liquid a day. She has felt weak but staying out of bed during the day. She had 3-4 loose all movements yesterday, one so far today. She took an Imodium 5 days ago and that constipated her for 2 days, she says. She had her first aranesp dose" didn't like it". It isn't clear what symptoms she was having but she felt "bad" 2 or 3 days after the shot. A detailed review of  systems today was otherwise noncontributory  PAST MEDICAL HISTORY: Significant for hysterectomy at the age of 29 with concurrent bladder "tuck up."  She also underwent appendectomy during that procedure. She is status post prior bilateral cataract surgery. She is status post tonsillectomy and adenoidectomy.  She has a history of hypertension and diabetes. She has a history of  diverticulosis.  She has a large hiatal hernia.  She has degenerative disk disease.  She has been noted to have coronary calcifications and she has hyperlipidemia.  There is a history of remote tobacco abuse. Seizure disorder as per HPI  FAMILY HISTORY The patient's father died at the age of 41 after heart surgery for aortic stenosis.  The patient's mother died at the age of 70.  The patient has one brother in good health.  There is no other family history to her knowledge of ovarian or breast cancer.    GYNECOLOGIC HISTORY: Menarche at age 12.  She is GX P3 with menopause in her late 74s.  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 She is currently residing in [place]. Her husband was in sales and had a history of Parkinson's disease.  He died following a fall in the year 2000.  The patient's son Regina West lives in [place], and has a history of MS.  Daughter Regina West lives in [place].. She is a Agricultural engineer.  Son Regina West lives in [place], [place] and is in Press photographer. The patient has 8 grandchildren. She attends the [employer].  She is a good friend of our patients J. and [facility] DIRECTIVES: in place  HEALTH MAINTENANCE:  (Updated December 2014) History  Substance Use Topics  . Smoking status: Former Smoker    Quit date: 05/23/1964  . Smokeless tobacco: Never Used  . Alcohol Use: 0.0 oz/week     Comment: rarely     Colonoscopy: 2008  PAP: UTD  MM:  Declines   Allergies  Allergen Reactions  . Avastin [Bevacizumab] Other (See Comments)    Swelling of the brain     Current Outpatient Prescriptions  Medication Sig Dispense Refill  . acyclovir (ZOVIRAX) 400 MG tablet Take 1 tablet (400 mg total) by mouth 2 (two) times daily. 60 tablet 11  . antiseptic oral rinse (BIOTENE) LIQD 15 mLs by Mouth Rinse route 2 (two) times daily. 1  Bottle 0  . cholecalciferol (VITAMIN D) 1000 UNITS tablet Take 1,000 Units by mouth 2 (two) times daily.    Marland Kitchen levETIRAcetam (KEPPRA) 250 MG tablet take 1 tablet by mouth twice a day 60 tablet 1  . lidocaine-prilocaine (EMLA) cream Apply 1 application topically as needed. 30 g 0  . metFORMIN (GLUCOPHAGE) 500 MG tablet   0  . nitrofurantoin (MACRODANTIN) 50 MG capsule TAKE TWO CAPSULES BY MOUTH ONCE DAILY 60 capsule 2  . omeprazole (PRILOSEC) 40 MG capsule Take 1 capsule (40 mg total) by mouth at bedtime. 30 capsule 2  . VIIBRYD 40 MG TABS      No current facility-administered medications for this visit.   Facility-Administered Medications Ordered in Other Visits  Medication Dose Route Frequency Provider Last Rate Last Dose  . sodium chloride 0.9 % injection 3 mL  3 mL Intracatheter PRN Chauncey Cruel, MD        OBJECTIVE: Elderly white woman who appears stated age  15 Vitals:  06/23/14 0946  BP: 128/64  Temp: 98.1 F (36.7 C)     Body mass index is 26.88 kg/(m^2).    ECOG FS: 1 Filed Weights   06/23/14 0946  Weight: 161 lb 8 oz (73.256 kg)   this weight is stable  Sclerae unicteric, EOMs intact Oropharynx clear and moist-- no thrush or other lesions No cervical or supraclavicular adenopathy Lungs no rales or rhonchi Heart regular rate and rhythm Abd soft, moderately distended, nontender to palpation specifically in the left upper quadrant, positive bowel sounds MSK no focal spinal tenderness, no upper extremity lymphedema Neuro: nonfocal, well oriented 3, positive affect Breasts: Deferred Skin: Significant peeling and some cracking of the skin and the palmar surfaces of both hands    Lab Results  Component Value Date   WBC 7.1 06/23/2014   NEUTROABS 5.6 06/23/2014   HGB 11.0* 06/23/2014   HCT 32.0* 06/23/2014   MCV 100.9 06/23/2014   PLT 270 06/23/2014   Results for DELANNA, BLACKETER (MRN [id number]) as of 06/22/2014 11:30  Ref. Range 02/24/2014 11:11 03/24/2014  10:48 04/14/2014 11:41 05/19/2014 09:51 06/16/2014 10:12  CA 125 Latest Ref Range: <35 U/mL 19 99 (H) 363 (H) 961 (H) 1375 (H)      Chemistry      Component Value Date/Time   NA 138 06/16/2014 1012   NA 136 10/17/2011 1118   NA 143 05/12/2011 1337   K 4.2 06/16/2014 1012   K 3.9 10/17/2011 1118   K 4.1 05/12/2011 1337   CL 100 08/20/2012 1124   CL 100 10/17/2011 1118   CL 92* 05/12/2011 1337   CO2 23 06/16/2014 1012   CO2 27 10/17/2011 1118   CO2 32 05/12/2011 1337   West 18.0 06/16/2014 1012   West 25* 10/17/2011 1118   West 19 05/12/2011 1337   CREATININE 1.2* 06/16/2014 1012   CREATININE 1.10 10/17/2011 1118   CREATININE 1.3* 05/12/2011 1337      Component Value Date/Time   CALCIUM 9.9 06/16/2014 1012   CALCIUM 9.8 10/17/2011 1118   CALCIUM 9.8 05/12/2011 1337   ALKPHOS 110 06/16/2014 1012   ALKPHOS 109 10/17/2011 1118   ALKPHOS 95* 05/12/2011 1337   AST 17 06/16/2014 1012   AST 22 10/17/2011 1118   AST 40* 05/12/2011 1337   ALT 12 06/16/2014 1012   ALT 14 10/17/2011 1118   ALT 28 05/12/2011 1337   BILITOT 0.54 06/16/2014 1012   BILITOT 0.4 10/17/2011 1118   BILITOT 0.60 05/12/2011 1337      STUDIES: No results found.   ASSESSMENT: 80 y.o.  [place] woman   (1) status post optimal debulking of a primary peritoneal serous adenocarcinoma September 2010, the tumor being moderately differentiated, pT3c NX (stage IIIC), treated according to GOG 252 with  intraperitoneal platinum and paclitaxel x4 given along with bevacizumab, complicated by SIADH after cycle 4, also with posterior reversible leukoencephalopathy.  After these problems resolved she received 2 additional cycles of single-agent carboplatin completed in March 2011  (2)  She had her first recurrence February 2012  treated with carboplatin and Gemzar for a total of 6 cycles between February and June 2012.  Her CEA-125 had normalized before the beginning of cycle 5.   (3) second recurrence documented February  2013,  treated with carboplatin/ doxil x 6, completed 10/03/2011, with a good response noted on restaging scans  (4) third recurrence documented February 2014, receiving single-agent carboplatin every 3 weeks, first dose 04/30/2012, completed 08/20/2012  (5) fourth recurrence  documented by abdominal symptoms and a rising CA 125 November 2014, receiving Abraxane, initially on days one and 8 of each 21 day cycle, with first dose given on 01/16/2013; starting 04/22/2013 she received the Abraxane every 2 weeks--rising CA 125 noted 05/31/2013  (6) started cyclophosphamide/ with "low dose" carboplatin 06/17/2013,  repeated every 21 days x8 doses, completed 12/09/13, with a rising CA 125 noted 3 months after discontinuation of treatment  (7) fifth recurrence February 2016, started liposomal doxorybicin 04/28/2014, with rising CA 125 after 2 doses (last dose 05/26/2014)  PLAN:  Unfortunately Keniesha CEA 120 5S continues to rise rather steeply despite 2 doses of liposomal doxorubicin. In addition, she has not tolerated this well, with significant palmar or plantar erythrodysesthesia.  Accordingly we are discontinuing that agent. Among choices for further treatment in order of higher to lower intensity would be cisplatin/gemcitabine, carboplatin/gemcitabine, Taxotere, Gemzar alone, or topotecan alone. We discussed these agents at length including the possible side effects, toxicities, and complications. I quoted her a 15% chance of response with Gemzar or to Poteet can alone and may be a 25% chance of response with a more aggressive agents.  After much discussion Antrice was very clear that she does not want any further treatment. She is ready for best supportive care/palliative care. She understands the goal of this is not life prolongation, but optimization of symptom control and keeping the patient  It is possible as long as possible.  She are ready has a living will and health care power of attorney and  all legal documents completed. She agrees to a hospice referral, which I think will be very helpful to her and her family.  Today we talked about her sugars. Because she is eating less I am concerned that she may become hypoglycemic at some point so I have asked her to check her blood sugars before breakfast and before supper one day and before lunch and at bedtime the next day. She should keep records and call either Dr. Reynaldo Minium or our office if her sugar rises above 250. Of course she will also call if it drops significantly especially if she has any symptoms.  She becomes constipated from the Imodium and then takes stool softeners and Merrill X which gives her diarrhea and to get out of the cycle we are simply going to not intervene at present but if she still has diarrhea after 2 more days then the instructions for her are to take Imodium with the second bowel movement of the day not the first bowel movement of the day.  She is having very minimal pain so for pain right now were going to use Tylenol 2 tablets up to 3 times a day. If that is not enough then in addition she will use Aleve one or 2 tablets up to 3 times a day with meals.  She is going to see me again June 6. She knows to call for any problems that may develop before that visit.   Chauncey Cruel, MD

## 2014-06-23 ENCOUNTER — Ambulatory Visit: Payer: Medicare Other

## 2014-06-23 ENCOUNTER — Ambulatory Visit (HOSPITAL_BASED_OUTPATIENT_CLINIC_OR_DEPARTMENT_OTHER): Payer: Medicare Other | Admitting: Oncology

## 2014-06-23 ENCOUNTER — Other Ambulatory Visit (HOSPITAL_BASED_OUTPATIENT_CLINIC_OR_DEPARTMENT_OTHER): Payer: Medicare Other

## 2014-06-23 ENCOUNTER — Telehealth: Payer: Self-pay | Admitting: Oncology

## 2014-06-23 ENCOUNTER — Telehealth: Payer: Self-pay | Admitting: *Deleted

## 2014-06-23 VITALS — BP 128/64 | Temp 98.1°F | Ht 65.0 in | Wt 161.5 lb

## 2014-06-23 DIAGNOSIS — C482 Malignant neoplasm of peritoneum, unspecified: Secondary | ICD-10-CM

## 2014-06-23 DIAGNOSIS — G63 Polyneuropathy in diseases classified elsewhere: Secondary | ICD-10-CM

## 2014-06-23 DIAGNOSIS — L271 Localized skin eruption due to drugs and medicaments taken internally: Secondary | ICD-10-CM

## 2014-06-23 DIAGNOSIS — C801 Malignant (primary) neoplasm, unspecified: Secondary | ICD-10-CM

## 2014-06-23 DIAGNOSIS — G3189 Other specified degenerative diseases of nervous system: Secondary | ICD-10-CM

## 2014-06-23 DIAGNOSIS — R970 Elevated carcinoembryonic antigen [CEA]: Secondary | ICD-10-CM

## 2014-06-23 DIAGNOSIS — D630 Anemia in neoplastic disease: Secondary | ICD-10-CM

## 2014-06-23 DIAGNOSIS — R52 Pain, unspecified: Secondary | ICD-10-CM

## 2014-06-23 DIAGNOSIS — E119 Type 2 diabetes mellitus without complications: Secondary | ICD-10-CM

## 2014-06-23 DIAGNOSIS — I10 Essential (primary) hypertension: Secondary | ICD-10-CM | POA: Diagnosis not present

## 2014-06-23 DIAGNOSIS — T451X5A Adverse effect of antineoplastic and immunosuppressive drugs, initial encounter: Principal | ICD-10-CM

## 2014-06-23 DIAGNOSIS — D61810 Antineoplastic chemotherapy induced pancytopenia: Secondary | ICD-10-CM

## 2014-06-23 DIAGNOSIS — R5383 Other fatigue: Secondary | ICD-10-CM

## 2014-06-23 LAB — CBC WITH DIFFERENTIAL/PLATELET
BASO%: 0.1 % (ref 0.0–2.0)
Basophils Absolute: 0 10*3/uL (ref 0.0–0.1)
EOS ABS: 0 10*3/uL (ref 0.0–0.5)
EOS%: 0.1 % (ref 0.0–7.0)
HEMATOCRIT: 32 % — AB (ref 34.8–46.6)
HGB: 11 g/dL — ABNORMAL LOW (ref 11.6–15.9)
LYMPH%: 5.2 % — AB (ref 14.0–49.7)
MCH: 34.7 pg — ABNORMAL HIGH (ref 25.1–34.0)
MCHC: 34.4 g/dL (ref 31.5–36.0)
MCV: 100.9 fL (ref 79.5–101.0)
MONO#: 1.1 10*3/uL — AB (ref 0.1–0.9)
MONO%: 15.7 % — ABNORMAL HIGH (ref 0.0–14.0)
NEUT%: 78.9 % — AB (ref 38.4–76.8)
NEUTROS ABS: 5.6 10*3/uL (ref 1.5–6.5)
PLATELETS: 270 10*3/uL (ref 145–400)
RBC: 3.17 10*6/uL — ABNORMAL LOW (ref 3.70–5.45)
RDW: 12.9 % (ref 11.2–14.5)
WBC: 7.1 10*3/uL (ref 3.9–10.3)
lymph#: 0.4 10*3/uL — ABNORMAL LOW (ref 0.9–3.3)

## 2014-06-23 NOTE — Telephone Encounter (Signed)
per pof to CX pt appt-pt got updated sch

## 2014-06-23 NOTE — Telephone Encounter (Signed)
This RN called [facility] per MD and placed referral.  Contact given as Saul Fordyce.

## 2014-06-24 NOTE — Telephone Encounter (Signed)
No entry 

## 2014-06-30 ENCOUNTER — Other Ambulatory Visit: Payer: Self-pay | Admitting: Neurology

## 2014-06-30 ENCOUNTER — Telehealth: Payer: Self-pay | Admitting: *Deleted

## 2014-06-30 ENCOUNTER — Other Ambulatory Visit: Payer: Self-pay | Admitting: *Deleted

## 2014-06-30 MED ORDER — MORPHINE SULFATE (CONCENTRATE) 20 MG/ML PO SOLN: 5.0000 mg | Freq: Four times a day (QID) | ORAL | Status: DC | PRN

## 2014-06-30 MED ORDER — MORPHINE SULFATE (CONCENTRATE) 20 MG/ML PO SOLN: 5.0000 mg | Freq: Four times a day (QID) | ORAL | Status: AC | PRN

## 2014-06-30 NOTE — Telephone Encounter (Signed)
This RN received call from [place].  She is at the pt's home and per assessment pt is having some mild discomfort in her abd area.  Pt is using aleve with some benefit and per discussion pt will increase current dose.  Terese discussed with pt possible need for stronger pain medication - and for something to be available if needed.  Pt is in agreement and Wynell Balloon is requesting order for prn opoid.  This note will be given to MD for review for appropriate orders/  Return call number for Wynell Balloon is [phone].

## 2014-06-30 NOTE — Telephone Encounter (Signed)
Per MD recommendation is for Roxanol 5mg  po q 6 hours prn. MD request aggressive bowel prophylaxis per use with opiods and ovarian cancer diagnosis.  This RN spoke with Wynell Balloon per above.  Plan is for roxanol to be dispensed as 20mg  per ml.  Prefilled syringes will be supplied for 5mg  dose for concern for self administering.  Per discussion regarding bowel prophylaxis- pt has been placed on 1 senna S daily with bid doses on Sunday and Wednesday.  Pt is being seen by hospice nurse 3 times a week at present.  CMA is in home every 2 hours checking on pt and assisting with needs.  Prescription printed and faxed to [place] at [facility] due to medication not available at pt's regular pharmacy.

## 2014-07-08 ENCOUNTER — Telehealth: Payer: Self-pay

## 2014-07-08 NOTE — Telephone Encounter (Signed)
Thank you!  This family is very proactive-- they will call us if they want [place] to intervene. I would favor [facility] sooner rather than later.

## 2014-07-08 NOTE — Telephone Encounter (Signed)
Helene Kelp - RN with hospice left message to update Dr. Jana Hakim on patient status.  Pt has had significant decline in last week.  She is now in a wheelchair - unable to ambulate on her own.  There has been a change in her level of awareness with a flat affect.  She is having difficulty swallowing her meds - family requested some meds be discontinued which has been done.  Remaining meds are being mixed with yogurt.  She is now needing 24 hour care - family is considering [facility] if this arrangement doesn't work.  Her morphine dose has been increased to 5 mg q2-4 hrs.    Helene Kelp is asking if prt needs to be flushed - she feels patient is doing fine with liquid meds.  I advised her that the flush would not need to be done unless there is a reason to keep it patent for future use, which does not seem likely.  Regardless, last port acces was 4/12 so pt would not be due for at least another 4-6 weeks.  Helene Kelp reports pt has 2+ pitting edema in her right leg, calf and foot - it is cool but not painful.  Helene Kelp will speak with the family and let us know if they decide they want doppler/treatment - she does not believe they will.    Routed to Dr. Jana Hakim

## 2014-07-14 ENCOUNTER — Other Ambulatory Visit: Payer: Medicare Other

## 2014-07-14 ENCOUNTER — Ambulatory Visit: Payer: Medicare Other

## 2014-07-14 ENCOUNTER — Ambulatory Visit: Payer: Medicare Other | Admitting: Oncology

## 2014-07-21 ENCOUNTER — Ambulatory Visit: Payer: Medicare Other

## 2014-07-21 ENCOUNTER — Other Ambulatory Visit: Payer: Medicare Other

## 2014-07-30 ENCOUNTER — Telehealth: Payer: Self-pay | Admitting: *Deleted

## 2014-07-30 NOTE — Telephone Encounter (Signed)
Regina West [phone]) SW with Hospice called to say patient is being moved to [facility] from her home @ [place].  Will let Dr. Jana Hakim know.

## 2014-08-05 ENCOUNTER — Other Ambulatory Visit: Payer: Self-pay | Admitting: Oncology

## 2014-08-06 ENCOUNTER — Telehealth: Payer: Self-pay

## 2014-08-06 NOTE — Telephone Encounter (Signed)
Fax received from Hospice dtd 08-21-2014 notifying death of patient.  Sent to scan.    Death certificate signed by Dr. Jana Hakim.  Returned to [employer].

## 2014-08-06 DEATH — deceased

## 2014-08-08 ENCOUNTER — Ambulatory Visit: Payer: Medicare Other | Admitting: Podiatry

## 2014-08-11 ENCOUNTER — Other Ambulatory Visit: Payer: Medicare Other

## 2014-08-11 ENCOUNTER — Ambulatory Visit: Payer: Medicare Other

## 2014-08-11 ENCOUNTER — Ambulatory Visit: Payer: Medicare Other | Admitting: Oncology

## 2014-08-14 ENCOUNTER — Telehealth: Payer: Self-pay

## 2014-08-14 NOTE — Telephone Encounter (Signed)
Signed orders faxed to hospice.  Sent to scan.

## 2014-09-15 ENCOUNTER — Other Ambulatory Visit: Payer: Medicare Other

## 2014-09-15 ENCOUNTER — Ambulatory Visit: Payer: Medicare Other

## 2015-08-15 ENCOUNTER — Other Ambulatory Visit: Payer: Self-pay | Admitting: Nurse Practitioner

## 2017-05-15 NOTE — Progress Notes (Signed)
This encounter was created in error - please disregard.  This encounter was created in error - please disregard.
